# Patient Record
Sex: Male | Born: 2000 | Race: Black or African American | Hispanic: No | Marital: Single | Smoking: Never smoker
Health system: Southern US, Community
[De-identification: ages and names within clinical notes are randomized; demographics above are authoritative.]

## PROBLEM LIST (undated history)

## (undated) DIAGNOSIS — G6181 Chronic inflammatory demyelinating polyneuritis: Secondary | ICD-10-CM

## (undated) DIAGNOSIS — E119 Type 2 diabetes mellitus without complications: Secondary | ICD-10-CM

## (undated) HISTORY — PX: NO PAST SURGERIES: SHX2092

## (undated) HISTORY — DX: Chronic inflammatory demyelinating polyneuritis: G61.81

## (undated) NOTE — *Deleted (*Deleted)
DIABETES SURVIVAL SKILLS PROGRAM  AGENDA    Endocrinology provider: Dr. Fransico Michael (upcoming appt 01/12/20 3:45pm)  Dietitian: [name], RD (no upcoming appt) -Previous appts on 06/10/18 and 02/25/19  Behavioral health specialist: Dr. Huntley Dec (no upcoming appt) -No prior appt  Patient referred by Dr. Fransico Michael for diabetes education. PMH is significant for T1DM with severe insulin resistance, acanthosis, diabetic polyneuropathy, morbid obesity, prior hx of COVID-19 infection 27-Nov-2019), and depression. Patient was lost to follow up from 02/25/19 to 12/03/19. At prior appt with Dr. Fransico Michael on 12/03/19, multiple issues were discussed. Patient states Spring/Summer 2021 he lost focus on his DM management due to how his mother passed away (now living with his grandmother). He has been guessing at his Novolog doses (not following the sliding scale plan provided by Dr Vanessa Mon[place] Mill on 11/04/18). He has SEVERE neuropathy; he can't stand up from a sitting position without using his arms for support and he also experiences numbness and loss of strength in the palm of his right hand. He was previously followed by Dr. Devonne Doughty, but has "aged out" of that practice. He was a no show for two NP appointments at [facility], once in Oct 27, 2022 when his mother died and once in 11/27/22 when hew was admitted with DKA. Dr. Fransico Michael asked the grandmother to call [facility] and obtain a new appointment for him. Patient did mention though at prior appt with Dr Fransico Michael that he wants help in managing his DM. Dr. Fransico Michael advised patient to continue Tresiba 100 units daily (200 units/mL pen) and to re-start Novolog sliding scale + fixed dose of Novolog 10 units three times daily prior to meals. Dr. Fransico Michael would like to transition patient to a 2 component plan (carb + correction factor) for rapid acting insulin in the future.  At prior appt for diabetes education on 12/16/2019, the following topics were discussed: diabetes  pathophysiology overview, diagnosis, monitoring, hypoglycemia management, glucagon use, hyperglycemia management, sick days management, blood sugar meters, continuous glucose monitors, and insulin pumps.   Patient presents with grandma Richarda Overlie) *** for follow up diabetes education appt. He reports taking Tresiba 100 units daily and typically takes Novolog 12-13 units. He eats 2-3 meals/day (usually breakfast/lunch). He states he has checked his blood sugar 5 times each day. He checks before he is about to eat and after he eats (20 minutes). He does not check fasting BG in the morning. He has noticed his blood sugar has been running in 230-240 mg/dL. He states his blood sugar has increased to 300-400 mg/dL after meals. He reports adherence to [place] and thinks he has forgotten his Novolog 2-3 times since prior appt.   Started metformin or prilosec?  Neurologist appt?  Remaining topics to discuss at follow up appointment are medications, exercise, and mental health How did prior appt with Georgiann Hahn go -- carb counting or carb estimation? Follow up with pictures of food Chair exercises?  School: not in school right now; planning to re-enroll in [employer] [place] [employer] (he is unsure of time of re-enroll)  Insurance Coverage: Managed Medicaid [facility] plan; ID # [id number])  Diabetes Diagnosis: 04/23/18  Family History: T2DM (maternal grandmother, paternal grandfather); T1DM (paternal grandmother); DM (uncle)  Patient-Reported BG Readings: ***  -Patient *** hypoglycemic events. --Treats hypoglycemic episode with candy (2 starbusts) --Hypoglycemic symptoms: "cold, calm, peaceful"  Preferred Pharmacy [facility] [phone] - [place], [place] - [address] AT [facility] [address]  [address] [place] [address]  Phone:  [phone] Fax:  [phone]  DEA #:  [id number]  Medication Adherence -Patient *** adherence with medications.  -Current diabetes medications include:  Tresiba 100 units daily (200 units/mL pen), Novolog 10 units prior to meals + SS (listed below) Novolog sliding scale before meals as follows: 150-199 take 1 extra unit of Novolog- total 11 units 200- take 2 extra units of Novolog- total 12 units 300s- take 3 extra units of Novolog- total 13 units 400s- take 4 extra units of Novolog - total 14 units 500s- take 5 extra units of Novolog - total 15 units 600s/HI take 6 extra units of Novolog- total 16 units.  -Prior diabetes medications include: none  Injection Sites (*** changes since prior appt on 12/16/2019) -Patient-reports injection sites are right arm, abdomen --Patient reports independently injecting DM medications. --Patient reports rotating injection sites  Diet (*** changes since prior appt on 12/16/2019) Patient reported dietary habits:  Eats 2 meals/day and 1 snacks/day Breakfast (8:30 am): eggs, cheese sandwich, [place] bacon, [place] sausage, [place] hot dogs Lunch (2-3 pm): frozen fried rice, tyson chicken nuggets, pot pies, hot pocket,  Dinner: skips  -eats ~3x per week (ramen noodles, fried chicken, baked chicken, broccoli, carrots, corn, bean) Snacks: cheese crackers Drinks: 1 gallon of milk daily, water (> 12 bottles daily), only zero sugar soda/juice (daily) -pasta/rice/bread: bread daily, rice 2x per week, pasta 1x per week -Corn/peas/squash: no squash/peas, eats corn 1x per week -Cereal: does not eat -Fruit: eats 2-3x per day (peaches, pears in fruit cups)  Exercise (*** changes since prior appt on 12/16/2019) Patient-reported exercise habits: walks around house (10 min at a time, 2x daily)  Monitoring: Patient *** episodes of nocturia (nighttime urination) each night.  Patient reports neuropathy (nerve pain) on right arm/hand. Patient *** visual changes. (Followed by ophthalmology; last seen 2 years ago; upcoming appt on 01/02/20) Patient *** self foot exams.  -Patient *** wearing socks/slippers in the house and  shoes outside.  -Patient *** monitoring for open wounds/cuts on her feet.  Diabetes Survival Skills Class  Topics:  1. Diabetes pathophysiology overview 2. Diagnosis 3. Monitoring 4. Hypoglycemia management 5. Glucagon Use 6. Hyperglycemia management 7. Sick days management  8. Medications 9. Blood sugar meters 10. Continuous glucose monitors 11. Insulin Pumps 12. Exercise  13. Mental Health 14. Diet  DSSP BINDER / INFO DSSP Binder  introduced & given  Disaster Planning Card Straight Answers for Kids/Parents  HbA1c - Physiology/Frequency/Results Glucagon App Info  THE PHYSIOLOGY OF TYPE 1 DIABETES Autoimmune Disease: can't prevent it;  can't cure it;  Can control it with insulin How Diabetes affects the body  2-COMPONENT METHOD REGIMEN  Using 2 Component Method _X_Yes   1.0 unit dosing scale Baseline  Insulin Sensitivity Factor Insulin to Carbohydrate Ratio  Components Reviewed:  Correction Dose, Food Dose,  Bedtime Carbohydrate Snack Table, Bedtime Sliding Scale Dose Table  Reviewed the importance of the Baseline, Insulin Sensitivity Factor (ISF), and Insulin to Carb Ratio (ICR) to the 2-Component Method Timing blood glucose checks, meals, snacks and insulin  MEDICAL ID: Why Needed  Emergency information given: Order info given DM Emergency Card  Emergency ID for vehicles / wallets / diabetes kit  Who needs to know  Know the Difference:  Sx/S Hypoglycemia & Hyperglycemia Patient's symptoms for both identified  ____TREATMENT PROTOCOLS FOR PATIENTS USING INSULIN INJECTIONS___  PSSG Protocol for Hypoglycemia Signs and symptoms Rule of 15/15 Rule of 30/15 Can identify Rapid Acting Carbohydrate Sources What to do for non-responsive diabetic Glucagon Kits:  PharmD demonstrated,  Parents/Pt. Successfully e-demonstrated      Patient / Parent(s) verbalized their understanding of the Hypoglycemia Protocol, symptoms to watch for and how to treat; and how to treat  an unresponsive diabetic  PSSG Protocol for Hyperglycemia Physiology explained:    Hyperglycemia      Production of Urine Ketones  Treatment   Rule of 30/30   Symptoms to watch for Know the difference between Hyperglycemia, Ketosis and DKA  Know when, why and how to use of Urine Ketone Test Strips:    PharmD demonstrated    Parents/Pt. Re-demonstrated  Patient / Parents verbalized their understanding of the Hyperglycemia Protocol:    the difference between Hyperglycemia, Ketosis and DKA treatment per Protocol   for Hyperglycemia, Urine Ketones; and use of the Rule of 30/30.  PSSG Protocol for Sick Days How illness and/or infection affect blood glucose How a GI illness affects blood glucose How this protocol differs from the Hyperglycemia Protocol When to contact the physician and when to go to the hospital  Patient / Parent(s) verbalized their understanding of the Sick Day Protocol, when and how to use it  PSSG Exercise Protocol How exercise effects blood glucose The Adrenalin Factor How high temperatures effect blood glucose Blood glucose should be 150 mg/dl to 161 mg/dl with NO URINE KETONES prior starting sports, exercise or increased physical activity Checking blood glucose during sports / exercise Using the Protocol Chart to determine the appropriate post  Exercise/sports Correction Dose if needed Preventing post exercise / sports Hypoglycemia Patient / Parents verbalized their understanding of of the Exercise Protocol, when / how  to use it  Blood Glucose Meter Care and Operation of meter Effect of extreme temperatures on meter & test strips How and when to use Control Solution:  PharmD Demonstrated; Patient/Parents Re-demo'd How to access and use Memory functions  Lancet Device Reviewed / Instructed on operation, care, lancing technique and disposal of lancets and  MultiClix and FastClix drums  Subcutaneous Injection Sites  Abdomen Back of the arms Mid anterior  to mid lateral upper thighs Upper buttocks  Why rotating sites is so important  Where to give Lantus injections in relation to rapid acting insulin   What to do if injection burns  Insulin Pens:  Care and Operation Expiration dates and Pharmacy pickup Storage:   Refrigerator and/or Room Temp Change insulin pen needle after each injection How check the accuracy of your insulin pen Proper injection technique Operation/care demonstrated by PharmD; Parents/Pt.  Re-demonstrated  NUTRITION AND CARB COUNTING Defining a carbohydrate and its effect on blood glucose Learning why Carbohydrate Counting so important  The effect of fat on carbohydrate absorption How to read a label:   Serving size and why it's important   Total grams of carbs  Sugar substitutes Portion control and its effect on carb counting.  Using food measurement to determine carb counts Calculating an accurate carb count to determine your Food Dose Using an address book to log the carb counts of your favorite foods (complete/discreet) Converting recipes to grams of carbohydrates per serving How to carb count when dining out  DIABETES RESOURCE LIST FOR PATIENTS & FAMILIES   Websites for Children & Families: www.diabetes.org  (American Diabetes Assoc.)(kids and teens sections under   [employer].  Diabetes [employer] information).  www.childrenwithdiabetes.com (organization for children/families with Type 1 Diabetes) www.jdrf.com (Juvenile Diabetes Assoc) www.diabetesnet.com www.lennydiabetes.com   (Carb Count and diabetes games, contests and iPhone Apps Sela Hua is "the Children's Diabetes Ambassador".)  https://mullins.com/  (Diabetes Lifestyle Resource. TV Program, 9000+ diabetes -friendly   recipes, videos)  Products  www.friocase.com  www.amazon.com  : 1. Food scales (our diabetes patients and parents seem to like the Kitrics Food Scale best. 2. Aqua Care with 10% Urea Skin Cream by [facility] Labs can be ordered at   www.amazon.com .  Use for dry skin. Comes in a lotion or 2.5 oz tube (Approximately $8 to $10). 3. SKIN-Tac Adhesive. Used with infusion sets for insulin pumps. Made by [employer]. Comes in liquid or individual foil packets (50/box). 4. TAC-Away Adhesive Remover.  50/box. Helps remove insulin pump infusion set adhesive from skin.  Infusion Pump Cases and Accessories 1. www.diabetesnet.com 2. www.medtronicdiabetes.com 3. www.StubAgent.pl   Diabetes ID Bracelets and Necklaces www.medicalert.com (Medic Alert bracelets/necklaces with emergency 800# for your   medical info in case needed by EMS/Emergency Room personnel) www.StubAgent.pl (Medical ID bracelets/necklaces, pump cases and DM supply cases) www.laurenshope.com (Medical Alert bracelets/necklaces) www.medicalided.com  Food and Carb Counting Web Sites www.calorieking.com www.ColumbusDryCleaner.fr  www.dlife.com  Assessment:  Education:  Remaining topics to discuss at follow up appointment are medications, exercise, mental health, and food. These topics *** completed.  Plan: 1. Medications:  a. *** Tresiba 100 units b. *** Novolog dosage 2. Diet:  a. Since patient is 45 years old, advised patient to eat 45-60 grams of carb per meal (3 meals/day) and eat 15 grams for snacks (2 snacks/day) 3. Exercise: a. *** 4. Mental Health a. Unable to discuss referral to Dr. Huntley Dec 5. Monitoring:  a. Stressed importance of monitoring BG minimally 3-4x daily 6. Follow Up: ***  This appointment required *** minutes of patient care (this includes precharting, chart review, review of results, face-to-face care, etc.).  Thank you for involving clinical pharmacist/diabetes educator to assist in providing this patient's care.  Zachery Conch, PharmD, CPP

---
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
## 2020-01-01 NOTE — Telephone Encounter (Signed)
Called [facility] to follow up, spoke with Lucas Cunningham, he is able to get all the [employer].  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  He asked about it being covered by insurance and I dold him we already had a PA approved and if he needs that PA number I can provide it.  He does not think he will need the PA number but he will call back if he has any questions.  I also told him the patient may or may not pick up the items between now and the 23rd as he has an appointment here on the 23rd and may wait until then to pick up.

## 2020-01-02 NOTE — Progress Notes (Signed)
Lucas Cunningham    Endocrinology provider: Dr. Tobe Sos (upcoming appt 01/12/20 3:45pm)  Dietitian: Jean Rosenthal, RD (upcoming appt 01/13/20 3:30 pm) -Previous appts on 06/10/18, 02/25/19, and 12/22/2019  Behavioral health specialist: Dr. Mellody Dance (no upcoming appt) -No prior appt  Patient referred by Dr. Tobe Sos for diabetes education. PMH is significant for T1DM with severe insulin resistance, acanthosis, diabetic polyneuropathy, morbid obesity, prior hx of COVID-19 infection 11/08/2019), and depression. Patient was lost to follow up from 02/25/19 to 12/03/19. At prior appt with Dr. Tobe Sos on 12/03/19, multiple issues were discussed. Patient states Spring/Summer 2021 he lost focus on his DM management due to how his mother passed away (now living with his grandmother). He has been guessing at his Novolog doses (not following the sliding scale plan provided by Dr Baldo Ash on 11/04/18). He has SEVERE neuropathy; he can't stand up from a sitting position without using his arms for support and he also experiences numbness and loss of strength in the palm of his right hand. He was previously followed by Dr. Jordan Hawks, but has "aged out" of that practice. He was a no show for two NP appointments at [facility], once in 10/08/2022 when his mother died and once in 11-08-2022 when hew was admitted with DKA. Dr. Tobe Sos asked the grandmother to call [facility] and obtain a new appointment for him. Patient did mention though at prior appt with Dr Tobe Sos that he wants help in managing his DM. Dr. Tobe Sos advised patient to continue Tresiba 100 units daily (200 units/mL pen) and to re-start Novolog sliding scale + fixed dose of Novolog 10 units three times daily prior to meals. Dr. Tobe Sos would like to transition patient to a 2 component plan (carb + correction factor) for rapid acting insulin in the future.  At prior appt on 12/31/2019, patient admitted that his BG meter was  broken. He had been using his grandmother's meter but did not want to bring it to clinic because he was not sure how to differentiate between her BG readings and his BG readings. He confirmed he purchased ketone strips. He also started exercising - reported doing leg kicks in his bed. His grandmother had been recording food items in food diary, however did not record grams of carbohydrate. Patient was provided Accu Chek meter and prescriptions were sent in for additional test strips/lancets. Patient was instructed to help grandma record amount of carbohydrates eaten and times of meal. Since patient did not bring in meter and BG readings could not be assessed insulin doses were continued. It is extremely challenging for patient to administer insulin doses and manually check BG readings considering his severe neuropathy. He lacks sensation in his left arm/hand and is losing sensation in his right hand. Discussed alternative options for DM management - home health aid/Afrezza/insulin pump. Also discussed CGM. Patient was agreeable to using Afrezza and to Dexcom G6 CGM. Prior authorizations were successfully completed and approved by Mike Gip, RN, on 12/31/19. Prescriptions were sent in to [facility].  Patient presents with grandma Haroldine Laws) for follow up diabetes education appt. Patient has obtained Dexcom G6 supplies and Afrezza supplies from the pharmacy [facility]) and brought to appointment. Patient did not bring manual BG meter to clinic. Patient's BG was 253 mg/dL upon arrival to clinic. He stated he had taken his Tyler Aas, however, he had not administered any Novolog yet. He reported he had not eaten breakfast yet. Patient reports he typically has been administering ~16 units of Novolog  with meals.   School: not in school right now; planning to re-enrol in [place] (he is unsure of time of re-enroll)  Insurance Coverage: Managed Medicaid [facility] plan; ID # [id number])  Diabetes Diagnosis:  04/23/18  Family History: T2DM (maternal grandmother, paternal grandfather); T1DM (paternal grandmother); DM (uncle)  Patient-Reported BG Readings:  -Patient denies hypoglycemic events. --Treats hypoglycemic episode with candy (2 starbusts) --Hypoglycemic symptoms: "cold, calm, peaceful"  Preferred Pharmacy [facility] [phone] - [place], [address]  [address] [place] [address]  Phone:  [phone] Fax:  [phone]  DEA #:  [id number]  Medication Adherence -Patient reports adherence with medications.  -Current diabetes medications include: Tresiba 90 units daily (200 units/mL pen), Novolog (Target BG 200, ISF 13, ICR 4) -Prior diabetes medications include: none  Injection Sites (no changes since prior appt on 12/31/19) -Patient-reports injection sites are right arm, abdomen --Patient reports independently injecting DM medications. --Patient reports rotating injection sites  Diet (no changes since prior appt on 12/31/19) Patient reported dietary habits (typically eats ~45 grams of carb):  Eats 2 meals/day and 1 snacks/day Breakfast (8:30-9:00 am): eggs with cheese, cheese sandwich with butter, 3 beef sausage patties / 3 eggs with 2 pieces of toast, hot dog slices Lunch (38VF -12 pm): chicken breast with eggs or ramen with tuna Dinner (10-11pm): 2 ham sandwiches (2 pieces ham with 1 slice of cheese and mayonnaise), sometimes potatoes Snacks: sugar free chocolate wafer cookies, mixed fruit Drinks: coffee with powder creamer, 2% milk (1-2 cups), water (24 bottle case in 2-3 days)  Exercise (changes since prior appt on 12/31/19) Patient-reported exercise habits: walks around house (10 min at a time, 2x daily); leg kicks in bed   Dexcom G6 patient education Person(s)instructed: patient, grandmother  Instruction: Patient oriented to three components of Dexcom G6 continuous glucose monitor (sensor,  transmitter, receiver/cellphone) Receiver or cellphone: receiver -Dexcom G6 AND dexcom clarity app downloaded onto cellphone  -Patient educated that Dexom G6 app must always be running (patient should not close out of app) -If using Dexcom G6 app, patient may share blood glucose data with up to 10 followers on dexcom follow app. Sensor code: [phone] Transmitter code: [id number]  CGM overview and set-up  1. Button, touch screen, and icons 2. Power supply and recharging 3. Home screen 4. Date and time 5. Set BG target range: 80-350 mg/dL 6. Set alarm/alert tone  7. Interstitial vs. capillary blood glucose readings  8. When to verify sensor reading with fingerstick blood glucose 9. Blood glucose reading measured every five minutes. 10. Sensor will last 10 days 11. Transmitter will last 90 days and must be reused  12. Transmitter must be within 20 feet of receiver/cell phone.  Sensor application -- sensor placed on  1. Site selection and site prep with alcohol pad 2. Sensor prep-sensor pack and sensor applicator 3. Sensor applied to area away from waistband, scarring, tattoos, irritation, and bones 4. Transmitter sanitized with alcohol pad and inserted into sensor. 5. Starting the sensor: 2 hour warm up before BG readings available 6. Sensor change every 10 days and rotate site 7. Call Dexcom customer service if sensor comes off before 10 days  Safety and Troubleshooting 1. Do a fingerstick blood glucose test if the sensor readings do not match how    you feel 2. Remove sensor prior to magnetic resonance imaging (MRI), computed tomography (CT) scan, or high-frequency electrical heat (diathermy) treatment. 3.  Do not allow sun screen or insect repellant to come into contact with Dexcom G6. These skin care products may lead for the plastic used in the Dexcom G6 to crack. 4. Dexcom G6 may be worn through a Environmental education officer. It may not be exposed to an advanced Imaging Technology (AIT)  body scanner (also called a millimeter wave scanner) or the baggage x-ray machine. Instead, ask for hand-wanding or full-body pat-down and visual inspection.  5. Doses of acetaminophen (Tylenol) >1 gram every 6 hours may cause false high readings. 6. Hydroxyurea (Hydrea, Droxia) may interfere with accuracy of blood glucose readings from Dexcom G6. 7. Store sensor kit between 36 and 86 degrees Farenheit. Can be refrigerated within this temperature range.  Contact information provided for [facility] customer service and/or trainer.  There were no vitals filed for this visit.  Lab Results  Component Value Date   HGBA1C 14.4 (H) 10/21/2019   HGBA1C 14.0 (A) 02/25/2019   HGBA1C >14 02/25/2019    Lab Results  Component Value Date   CPEPTIDE 3.8 04/23/2018    Assessment:  Patient's goal with DM management: "Get diabetes controlled as independently as possible"  DM remains uncontrolled, but is improving. BG upon arrival to clinic was 253 mg/dL. His meter is broken so he did not bring it to clinic. He does not report hypoglycemic symptoms. Patient appears improved clinically as well - he is quicker to response and able to get around with his walker faster as well. Thoroughly counseled patient on Afrezza - mechanism of action, onset, proper administration, side effects, dosing, and stability. Provided patient handout. Patient was able to successfully demonstrate understanding using teach back method and showing proper administration with demo device. Patient has variety pack of Afrezza, which contains blue cartridges (4 units), green cartridges (8 units), and yellow cartridges (12 units). He states he typically is using 16 units of Novolog with meals and understands that he will have to use 2 cartridges (2 green cartridges (8 + 8 = 16) OR 1 yellow cartridge + 1 blue cartridge (12 + 4 = 16)). Advised him to drink water (provided him water) before and after inhalation to prevent coughing (side effect of  Afrezza). At end of appointment, patient stated that he thinks Afrezza will be much easier to use than Novolog considering his severe neuropathy.  Dexcom G6 CGM placed on back of patient's right side of abdomen successfully. Emailed patient ([email or web address]) instructions on how to synched Dexcom Clarity to [facility] Clarity account with receiver on computer at home. Discussed difference between glucose reading from blood vs interstitial fluid, how to interpret Dexcom arrows, how to order Dexcom sensor overpatches, and use of Skin Tac/Tac Away to assist with CGM adhesion/removal. Provided handout with all of this information as well.  Plan: 1. Medications:  a. Continue Tresiba 90 units b. INITIATE Afrezza (Target BG 200, ISF 13, ICR 4) 2. Monitoring a. INITIATE Dexcom G6 CGM b. Kodah Maret has a diagnosis of diabetes, checks blood glucose readings > 4x per day, treats with > 3 insulin injections, and requires frequent adjustments to insulin regimen. This patient will be seen every six months, minimally, to assess adherence to their CGM regimen and diabetes treatment plan 3. Follow Up: 01/16/20 12:00 pm (sooner if patient has issues)  This appointment required 75 minutes of patient care (this includes precharting, chart review, review of results, face-to-face care, etc.).  Thank you for involving clinical pharmacist/diabetes educator to assist in providing this patient's care.  Stanton Kidney  Lovena Le, PharmD, CPP, CDCES

## 2020-01-05 NOTE — Telephone Encounter (Signed)
Called [facility] to verify Afrezza and Dexcom are ready for pick up tomorrow.  1 box of Afrezza is ready and 1 on order, but he can pick up the one box and the Dexcom supplies tomorrow.

## 2020-01-06 ENCOUNTER — Other Ambulatory Visit: Payer: Self-pay

## 2020-01-06 ENCOUNTER — Ambulatory Visit (INDEPENDENT_AMBULATORY_CARE_PROVIDER_SITE_OTHER): Payer: Medicaid Other | Admitting: Pharmacist

## 2020-01-06 VITALS — Wt 271.2 lb

## 2020-01-06 DIAGNOSIS — E1065 Type 1 diabetes mellitus with hyperglycemia: Secondary | ICD-10-CM

## 2020-01-06 LAB — POCT GLUCOSE (DEVICE FOR HOME USE): POC Glucose: 253 mg/dl — AB (ref 70–99)

## 2020-01-06 NOTE — Patient Instructions (Signed)

## 2020-01-06 NOTE — Telephone Encounter (Signed)
Scripts picked up from pharmacy and brought to today's appointment.

## 2020-01-06 NOTE — Progress Notes (Signed)
DIABETES SURVIVAL SKILLS PROGRAM  AGENDA    Endocrinology provider: Dr. Fransico Michael (upcoming appt 03/14/19 9:00 am)  Dietitian: [name], RD (no upcoming appt) -Previous appts on 06/10/18, 02/25/19, and 12/22/2019  Behavioral health specialist: Dr. Huntley Dec (no upcoming appt) -No prior appt  Patient referred by Dr. Fransico Michael for diabetes education. PMH is significant for T1DM with severe insulin resistance, acanthosis, diabetic polyneuropathy, morbid obesity, prior hx of COVID-19 infection 2019-11-04), and depression. Patient was lost to follow up from 02/25/19 to 12/03/19. At prior appt with Dr. Fransico Michael on 12/03/19, multiple issues were discussed. Patient states Spring/Summer 2021 he lost focus on his DM management due to how his mother passed away (now living with his grandmother). He has been guessing at his Novolog doses (not following the sliding scale plan provided by Dr Vanessa [place] on 11/04/18). He has SEVERE neuropathy; he can't stand up from a sitting position without using his arms for support and he also experiences numbness and loss of strength in the palm of his right hand. He was previously followed by Dr. Devonne Doughty, but has "aged out" of that practice. He was a no show for two NP appointments at [facility], once in 04-Oct-2022 when his mother died and once in 11/04/22 when hew was admitted with DKA. Dr. Fransico Michael asked the grandmother to call [facility] and obtain a new appointment for him. Patient did mention though at prior appt with Dr Fransico Michael that he wants help in managing his DM. Dr. Fransico Michael advised patient to continue Tresiba 100 units daily (200 units/mL pen) and to re-start Novolog sliding scale + fixed dose of Novolog 10 units three times daily prior to meals. Dr. Fransico Michael would like to transition patient to a 2 component plan (carb + correction factor) for rapid acting insulin in the future.  At prior appt on 01/06/2020, patient was successfully started on Afrezza and Dexcom G6  CGM.  Patient presents with grandma Richarda Overlie) for follow up diabetes education appt. Patient's stomach and head were hurting on Monday so he rescheduled physical therapy/occupational therapy on 01/13/20. He also rescheduled appt with Dr. Fransico Michael from 01/12/20 to 03/13/19 9:00 am. PT/OT told him he needs to use hand more regularly and use the walker when walking. PT/OT also recommended leg braces for feet and brace for his bad hand. Exercises he did at PT/OT - standing up then down, walking, and moving blocks with his bad hand. He reports he thinks after PT/OT he will be able to regain function with his bad hand. He will follow with PT/OT every 8 weeks. He requests a Humalog Stephanie Coup - states it is easier to inject than Novolog pen if he forgets to take Afrezza. He takes Novolog pen for breakfast (forgets to pull out Afrezza from fridge), Afrezza for lunch, Afrezza for dinner. He uses 2 green cartridges for every meal (16 units).  He was confused and didn't realize he was instructed to continue carb counting to calculate food dose and reviewing BG to calculate correction dose.  School: not in school right now; planning to re-enrol in [place] (he is unsure of time of re-enroll)  Insurance Coverage: Managed Medicaid [facility] plan; ID # [id number])  Diabetes Diagnosis: 04/23/18  Family History: T2DM (maternal grandmother, paternal grandfather); T1DM (paternal grandmother); DM (uncle)  Patient-Reported BG Readings:  -Patient denies hypoglycemic events. --Treats hypoglycemic episode with candy (2 starbusts) --Hypoglycemic symptoms: "cold, calm, peaceful"  Preferred Pharmacy [facility] [phone] - [place], [place] - [address]  [address] [place] [address]  Phone:  [phone] Fax:  [phone]  DEA #:  [id number]  Medication Adherence -Patient reports adherence with [name], however, sometimes forgets Afrezza time  insulin. -Current diabetes medications include: Tresiba 90 units daily (200 units/mL pen), Afrezza (Target BG 200, ISF 13, ICR 4) -Prior diabetes medications include: none  Injection Sites (changes since prior appt on 01/06/20) -Patient-reports injection sites are right arm, abdomen, now doing his legs  --Patient reports independently injecting DM medications. --Patient reports rotating injection sites  Diet (changes since prior appt on 01/06/20) Patient reported dietary habits (gets $90 in food stamps each month) Eats 2 meals/day and 1 snacks/day Breakfast (8:30-9:00 am): 3 eggs with 2 pieces of whote wheat natuure's own brand toast with [place] breast or bolognea --Carbs: 11 g per slice of toast, 22 g total Lunch (11am -12 pm): chicken breast with eggs or ramen with tuna, now doing chicken/tuna with [place] chicken flavor ramen (2 bags at a time) and mayonnaise  --Carbs: 52 g per bag, 104 g total  Dinner (10-11pm): will skip dinner occasionally (2x in past week), noodles with chicken/tuna and mayonnaise;  Snacks: sugar free chocolate wafer cookies, mixed fruit Drinks: coffee with powder creamer, 2% milk (1-2 cups), water (24 bottle case in 2-3 days), Shake em up powder (sugar free, starburst)   Exercise (changes since prior appt on 01/06/20) Patient-reported exercise habits: walks around house (10 min at a time, 2x daily), sometimes walk around porch; leg kicks in bed everyday for 5 min    Monitoring: Patient 2 episodes of nocturia (nighttime urination).  Patient denies neuropathy (nerve pain). Patient denies visual changes. (Followed by ophthalmology) Patient reports self foot exams.  -Patient reports socks/slippers in the house and shoes outside.  -Patient reports monitoring for open wounds/cuts on her feet; no open cuts/wounds.       There were no vitals filed for this visit.  Lab Results  Component Value Date   HGBA1C 14.4 (H) 10/21/2019   HGBA1C 14.0 (A) 02/25/2019    HGBA1C >14 02/25/2019    Lab Results  Component Value Date   CPEPTIDE 3.8 04/23/2018    Assessment:  Patient's goal with DM management: "Get diabetes controlled as independently as possible"  TIR 0%, 0% low/very low. DM remains uncontrolled but is improving. Unfortunately, patient only wore Dexcom 18% since last appt when Dexcom was initiated on 11/23. There are a few days of data available to review trend. His Dexcom died and since he was not reviewing BG reading to calculate insulin dose he was unaware that it died. It is possible he was using a Estate manager/land agent. He will start reviewing BG readings to calculate correction dose for meals and he will charge Dexcom with green charging cord that Dexcom supplies.Will increase Tresiba 90 units --> 100 units. Patient was using ~16 units of Afrezza (or Novolog if he forgot to take Afrezza out of the fridge) with meals and stopped calculating correction dose/food dose for meals. He likes Humalog pen better than Novolog (easier to inject). Will complete Humalog PA. There was confusion in regards to instructions at last appt with myself Advised patient to restart using graph provided to calculate carb dose/corrrection dose. Assisted patient with carb counting for his current meals - currently eats ~22 grams of carb for breakfast and ~104 grams of carb for lunch/dinner. Reviewed how to calculate Afrezza dose using chart again by going through 3 examples. He is able to repeat  back instructions via teach back method to demonstrate understanding. He would prefer f/u appt in 1 week.  Plan: 1. Medications:  a. INCREASE Tresiba 90 units --> 100 units daily b. Continue Afrezza (Target BG 200, ISF 13, ICR 4) i. Will complete Humalog Kwikpen PA to use as back up   ii. Take tray of Afrezza out of fridge and replace when 2-3 packs of cartridges are left 2. Monitoring a. Continue Dexcom G6 CGM b. Fredis Malkiewicz has a diagnosis of diabetes, checks blood glucose readings >  4x per day, treats with > 3 insulin injections, and requires frequent adjustments to insulin regimen. This patient will be seen every six months, minimally, to assess adherence to their CGM regimen and diabetes treatment plan 3. Follow Up: 1 week  This appointment required 60 minutes of patient care (this includes precharting, chart review, review of results, face-to-face care, etc.).  Thank you for involving clinical pharmacist/diabetes educator to assist in providing this patient's care.  Zachery Conch, PharmD, CPP, CDCES

## 2020-01-07 ENCOUNTER — Telehealth (INDEPENDENT_AMBULATORY_CARE_PROVIDER_SITE_OTHER): Payer: Self-pay | Admitting: Pharmacist

## 2020-01-07 NOTE — Telephone Encounter (Signed)
Contacted patient to assess how new medication (Afrezza) and CGM (Dexcom G6) doing.  He forgot to administer insulin for dinnner last night. He states today he was going to use Afrezzza but forgot to take it out of the fridge so he administered insulin injections today. He has a few Afrezza cartridges in his room now to keep at room temperature so he does not forget to use them.  Will see patient in 1 week to re-assess how he is doing.   Thank you for involving clinical pharmacist/diabetes educator to assist in providing this patient's care.   Zachery Conch, PharmD, CPP, CDCES

## 2020-01-12 ENCOUNTER — Ambulatory Visit (INDEPENDENT_AMBULATORY_CARE_PROVIDER_SITE_OTHER): Payer: Medicaid Other | Admitting: "Endocrinology

## 2020-01-12 ENCOUNTER — Ambulatory Visit: Payer: Medicaid Other | Admitting: Physical Therapy

## 2020-01-12 ENCOUNTER — Ambulatory Visit: Payer: Medicaid Other | Admitting: Occupational Therapy

## 2020-01-12 NOTE — Progress Notes (Deleted)
Subjective:  Subjective  Patient Name: Lucas Cunningham Date of Birth: Jun 28, 2000  MRN: [id number]  Lucas Cunningham  presents to the office today for follow up evaluation and management of his type 2 diabetes  HISTORY OF PRESENT ILLNESS:   Lucas Cunningham is a 19 y.o. African-American young man.   Lucas Cunningham was accompanied by his grandmother, whom he lives with now. .  1. Lucas Cunningham was seen by his PCP in March 2019 for his 16 year [place]. At that visit he was noted to have had rapid weight gain since his 14 year [place].  Weight had increased from 111.2 kg (245 lbs) to 154.7 kg (341 lbs). His A1C was 6.3%. His triglycerides were elevated at 532 mg/dL. His Vit D was low at 6.5 ng/mL. He was referred to endocrinology for further evaluation. .  2. Lucas Cunningham was subsequently hospitalized at [facility] on 04/23/18 for newly-diagnosed insulin-requiring T2DM in the setting or morbid obesity, dehydration, and hyperosmolar hyperglycemic state. I consulted on him then. He was started on Lantus insulin and Novolog insulin using a two-component plan. He was discharged on 04/30/18. When his insulin autoantibody result was reported as elevated, his diagnosis was changed to Type 1 DM with severe insulin resistance.   3. Clinical course:   A. Lucas Cunningham was admitted to [facility] on November 06, 2019 for DKA and incidentally recognized covid-19 infection. His CBG was 357, venous pH 7.17, BHOB >8.0 (ref 0.05-0.27), and HbA1c 14.4%. His DKA was attributed to non-compliance with taking his insulins. He received monoclonal antibody treatment on November 06, 2019. He was discharged on 10/25/19. He admitted that during the Spring and summer he lost focus on his diabetes care, then his mother died and he became depressed. He has moved in with his grandmother, who also has DM. He now wants help in taking care of himself.    4. Lucas Cunningham was last seen in [facility] on 12/03/19.   A. In the interim, Lucas Cunningham has been healthy.  B. He has been working with Dr.  Lovena Le, PharmD and CDE, to imrpove his DM care.    C. He takes 100 units of Tresiba insulin per day.   D. He has not been following the Novolog plan written by Dr. Charna Archer on 02/27/19 which was: he has just been guessing at doses.  Novolog fixed 10 units before meals plus sliding scale - Novolog sliding scale before meals as follows: 150-199 take 1 extra unit of Novolog- total 11 units 200s - take 2 extra units of Novolog- total 12 units 300s - take 3 extra units of Novolog- total 13 units 400s - take 4 extra units of Novolog - total 14 units 500s - take 5 extra units of Novolog - total 15 units 600s/HI -  take 6 extra units of Novolog- total 16 units   E. He has a Dexcom at home, but is not using it.   F. He has continued to have trouble with decreased strength in his legs. He can't stand up from a sitting position without using his arms for support. When he does stand he can't walk without using a walker for support.   G. He is also numbness and loss of strength in the palm of his right hand.   H. He was a No show for two NP appointments at [facility], once in Oct 06, 2022 when his mother died and once in 06-Nov-2022 when hew was admitted with DKA. I asked the grandmother to call [facility] and obtain a new appointment for him.  4. Pertinent Review of Systems:  Constitutional: The patient feels "a lot better than I was a month ago.  Eyes: Vision seems to be good. There are no recognized eye problems. His last eye exam was in 2019. Neck: The patient has no complaints of anterior neck swelling, soreness, tenderness, pressure, discomfort, or difficulty swallowing.   Heart: Heart rate increases with exercise or other physical activity. The patient has no complaints of palpitations, irregular heart beats, chest pain, or chest pressure.   Lungs: No problems with his breathing.   Gastrointestinal: He has belly hunger and postprandial bloating frequently. He has some dyspepsia. The patient has  no complaints of  acid reflux, upset stomach, stomach aches or pains, diarrhea, or constipation.  Hands: As above Legs: Muscle mass and strength seem normal. There are no complaints of numbness, tingling, burning, or pain. No edema is noted.  Feet: The bottom of his feet feel "funny, tingly, and weak". There are no other complaints of other numbness, tingling, burning, or pain. No edema is noted. Neurologic: He complains that his "balance is off" at times. There are no other recognized problems with muscle movement, sensation, or coordination. GU: He has nocturia frequently. Hypoglycemia- No symptoms recently   Diabetes ID- He has a bracelet, but does not wear it.   Annual Labs- due March 2021  PAST MEDICAL, FAMILY, AND SOCIAL HISTORY  Past Medical History:  Diagnosis Date  . Diabetes mellitus without complication ([place])    type 1    Family History  Problem Relation Age of Onset  . Heart disease Mother   . Hypertension Mother   . Diabetes Maternal Grandmother   . Diabetes Maternal Grandfather   . Diabetes Paternal Grandmother   . Migraines Neg Hx   . Seizures Neg Hx   . Autism Neg Hx   . ADD / ADHD Neg Hx   . Anxiety disorder Neg Hx   . Depression Neg Hx   . Bipolar disorder Neg Hx   . Schizophrenia Neg Hx      Current Outpatient Medications:  .  Accu-Chek FastClix Lancets MISC, 1 Device by Does not apply route as directed. Use with Fastclix lancet device to check blood sugar up to 8x daily. (diagnosis code E10.65), Disp: 300 each, Rfl: 11 .  acetone, urine, test strip, 1 strip by Does not apply route as needed for high blood sugar. Up to 8x daily if necessary, Disp: 360 strip, Rfl: 11 .  Continuous Blood Gluc Receiver (DEXCOM G6 RECEIVER) DEVI, 1 Device by Does not apply route as directed., Disp: 1 each, Rfl: 2 .  Continuous Blood Gluc Sensor (DEXCOM G6 SENSOR) MISC, Inject 1 applicator into the skin as directed. (change sensor every 10 days), Disp: 3 each, Rfl: 11 .   Continuous Blood Gluc Transmit (DEXCOM G6 TRANSMITTER) MISC, Inject 1 Device into the skin as directed. (re-use up to 8x with each new sensor), Disp: 1 each, Rfl: 3 .  gabapentin (NEURONTIN) 300 MG capsule, Take 1 capsule (300 mg total) by mouth 2 (two) times daily. (Patient not taking: Reported on 12/03/2019), Disp: 60 capsule, Rfl: 5 .  Glucagon (BAQSIMI TWO PACK) 3 MG/DOSE POWD, Place 1 spray into the nose as directed., Disp: 2 each, Rfl: 3 .  Glucose Blood (BLOOD GLUCOSE TEST STRIPS) STRP, 1 Device by Other route as directed. To check blood sugar up to 8x per day, Disp: 300 strip, Rfl: 11 .  insulin aspart (NOVOLOG FLEXPEN) 100 UNIT/ML FlexPen, Sig take up to  100 units per day   - Novolog meal coverage to fixed 15 units before meals plus sliding scale  - Novolog sliding scale before meals as follows: 150-199 take 1 extra unit of Novolog- total 16 units 200 - take 2 extra units of Novolog- total 17 units 300- take 3 extra units of Novolog- total 18 units 400- take 4 extra units of Novolog - total 19 units 500- take 5 extra units of Novolog - total 20 units 600 if HI take 6 extra units of Novolog- total 21 units. ., Disp: 15 mL, Rfl:  .  Insulin Degludec (TRESIBA FLEXTOUCH) 200 UNIT/ML SOPN, 100 Units by Subconjunctival route daily. (Patient taking differently: Inject 100 Units into the skin at bedtime. ), Disp: 60 mL, Rfl: 5 .  Insulin Pen Needle (BD PEN NEEDLE NANO U/F) 32G X 4 MM MISC, Inject up to 8 times per day., Disp: 250 each, Rfl: 6 .  Insulin Regular Human (AFREZZA) 4 & 8 & 12 units POWD, Inhale up to 96 units daily, Disp: 360 each, Rfl: 11 .  ketoconazole (NIZORAL) 2 % cream, Apply to fet daily., Disp: 30 g, Rfl: 6 .  Lancets Misc. (ACCU-CHEK FASTCLIX LANCET) KIT, 1 Device by Does not apply route as directed. Use with lancets to check blood sugar up to 8x daily.  (diagnosis code E10.65), Disp: 1 kit, Rfl: 3  Allergies as of 01/12/2020  . (No Known Allergies)     reports that he is a  non-smoker but has been exposed to tobacco smoke. He has never used smokeless tobacco. He reports previous alcohol use. He reports previous drug use. Pediatric History  Patient Parents  . Speranza, Antwane (Father)   Other Topics Concern  . Not on file  Social History Narrative   Live with mom, step dad, brother and sister. He graduated HS 2021       1. School and Family: He finished high school on line. Lives with grandmother now.  2. Activities: Not able to walk very well.  3. Primary Care Provider: Waynard Edwards, NP in the past  ROS: There are no other significant problems involving Duglas's other body systems.    Objective:  Objective  Vital Signs:   There were no vitals taken for this visit.  Blood pressure percentiles are not available for patients who are 18 years or older.  Ht Readings from Last 3 Encounters:  12/22/19 5' 11.26" (1.81 m) (73 %, Z= 0.61)*  10/21/19 6' (1.829 m) (81 %, Z= 0.88)*  08/05/19 5' 11.26" (1.81 m) (74 %, Z= 0.63)*   * Growth percentiles are based on CDC (Boys, 2-20 Years) data.   Wt Readings from Last 3 Encounters:  01/06/20 271 lb 3.2 oz (123 kg) (>99 %, Z= 2.72)*  12/31/19 270 lb 12.8 oz (122.8 kg) (>99 %, Z= 2.71)*  12/22/19 271 lb (122.9 kg) (>99 %, Z= 2.71)*   * Growth percentiles are based on CDC (Boys, 2-20 Years) data.   HC Readings from Last 3 Encounters:  No data found for [facility]   There is no height or weight on file to calculate BSA. No height on file for this encounter. No weight on file for this encounter.   PHYSICAL EXAM:   Constitutional: The patient appears healthy, but obese. He stands with the support of his walker because he can't get up from our chairs. His weight has decreased 41 additional pounds in the past 9 months.  He is alert and bright. His affect and  insight are normal.  Head: The head is normocephalic. Face: The face appears normal. There are no obvious dysmorphic features. Eyes: The eyes  appear to be normally formed and spaced. Gaze is conjugate. There is no obvious arcus or proptosis. Moisture is low.  Ears: The ears are normally placed and appear externally normal. Mouth: The oropharynx and tongue appear normal. Dentition appears to be normal for age. Oral moisture is low.  Neck: The neck appears to be visibly normal. The thyroid gland is 20+ grams in size. The consistency of the thyroid gland is normal. The thyroid gland is not tender to palpation. +2 acanthosis Lungs: The lungs are clear to auscultation. Air movement is good. Heart: Heart rate and rhythm are regular. Heart sounds S1 and S2 are normal. I did not appreciate any pathologic cardiac murmurs. Abdomen: The abdomen is morbidly obese. Bowel sounds are normal. There is no obvious hepatomegaly, splenomegaly, or other mass effect.  Arms: Muscle size and bulk are normal for age.  Hands: There is no obvious tremor. Phalangeal and metacarpophalangeal joints are normal. Palmar muscles are normal for age. Palmar skin is normal. Palmar moisture is low. His right hand is his dominant hand, but his handgrip is weaker on the right. He has decreased sensation to touch in the radial nerve distribution of his right hand.  Legs: Muscles appear normal for age. No edema is present. Feet: Feet are normally formed. Dorsalis pedal pulses are normal 1+. He has 2+ tinea pedis. Neurologic: Strength is difficult to assess overall. Sensation to touch is normal in  the legs, but decreased in both heels.   LAB DATA:   Results for orders placed or performed in visit on 01/06/20 (from the past 672 hour(s))  POCT Glucose (Device for Home Use)   Collection Time: 01/06/20  1:18 PM  Result Value Ref Range   Glucose Fasting, POC     POC Glucose 253 (A) 70 - 99 mg/dl  Results for orders placed or performed in visit on 12/31/19 (from the past 672 hour(s))  POCT Glucose (Device for Home Use)   Collection Time: 12/31/19  9:23 AM  Result Value Ref Range    Glucose Fasting, POC     POC Glucose 348 (A) 70 - 99 mg/dl  Results for orders placed or performed in visit on 12/22/19 (from the past 672 hour(s))  POCT Glucose (Device for Home Use)   Collection Time: 12/22/19 11:46 AM  Result Value Ref Range   Glucose Fasting, POC 361 (A) 70 - 99 mg/dL   POC Glucose    POCT urinalysis dipstick   Collection Time: 12/22/19 11:53 AM  Result Value Ref Range   Color, UA     Clarity, UA     Glucose, UA Positive (A) Negative   Bilirubin, UA     Ketones, UA Trace    Spec Grav, UA     Blood, UA     pH, UA     Protein, UA     Urobilinogen, UA     Nitrite, UA     Leukocytes, UA     Appearance     Odor    Results for orders placed or performed in visit on 12/16/19 (from the past 672 hour(s))  POCT Glucose (Device for Home Use)   Collection Time: 12/16/19 10:24 AM  Result Value Ref Range   Glucose Fasting, POC     POC Glucose 495 (A) 70 - 99 mg/dl  POCT urinalysis dipstick   Collection Time:  12/16/19 10:26 AM  Result Value Ref Range   Color, UA     Clarity, UA     Glucose, UA Positive (A) Negative   Bilirubin, UA     Ketones, UA moderate    Spec Grav, UA     Blood, UA     pH, UA     Protein, UA     Urobilinogen, UA     Nitrite, UA     Leukocytes, UA     Appearance     Odor    POCT Glucose (Device for Home Use)   Collection Time: 12/16/19 11:24 AM  Result Value Ref Range   Glucose Fasting, POC     POC Glucose 486 (A) 70 - 99 mg/dl  POCT urinalysis dipstick   Collection Time: 12/16/19  1:38 PM  Result Value Ref Range   Color, UA     Clarity, UA     Glucose, UA Positive (A) Negative   Bilirubin, UA     Ketones, UA Large    Spec Grav, UA     Blood, UA     pH, UA     Protein, UA     Urobilinogen, UA     Nitrite, UA     Leukocytes, UA     Appearance     Odor     Lab s 12/03/19: CBG 324  Labs 10/21/19: As above  Labs 02/25/19: HbA1c 14.0%, CBG 376; Urine ketones large  Results for PASCUAL, MANTEL (MRN [id number]) as of  06/10/2018 09:38  Ref. Range 04/23/2018 18:28 04/24/2018 01:23  Glutamic Acid Decarb Ab Latest Ref Range: 0.0 - 5.0 U/mL <5.0   Insulin Antibodies, Human Latest Units: uU/mL  5.5 (H)  Pancreatic Islet Cell Antibody Latest Ref Range: Neg:<1:1  Negative        Assessment and Plan:  Assessment  ASSESSMENT: Lucas Cunningham is a 19 y.o. African-American young man with uncontrolled T1DM due to non-compliance and depression. He also has significant peripheral neuropathy and muscle weakness. He had a positive insulin antibodies consistent with type 1 diabetes, but also had a high C-peptide of 3.8 consistent with insulin resistance.    1. Uncontrolled type 1 diabetes with severe insulin resistance due to morbid obesity:  A. He says that he is currently taking his Tresiba dose of 100 units daily.   B. He is supposed to be taking Novolog insulin according to the plan that Dr. Baldo Ash established at his last visit, but has not been taking the sliding scale doses and may have been missing other doses as well.   - Change Novolog to fixed 10 units before meals plus sliding scale - Novolog sliding scale before meals as follows: 150-199 take 1 extra unit of Novolog- total 11 units 200- take 2 extra units of Novolog- total 12 units 300s- take 3 extra units of Novolog- total 13 units 400s- take 4 extra units of Novolog - total 14 units 500s- take 5 extra units of Novolog - total 15 units 600s/HI take 6 extra units of Novolog- total 16 units.  - Checks BGs before meals and at bedtime. - Bring in BG meter tomorrow and again next Wednesday.    C. I told Lucas Cunningham honestly today that if he does not take care of his DM and bring his BGs down, his DM will kill him within the next 3-4 years.  2. Peripheral neuropathy:   A. He has had leg/quadriceps weakness in the past, but now has problems with strength and sensation  in his right hand and sensation in his heels.  B. He was previously followed by Dr. Jordan Hawks, but has "aged out"  of that practice. He needs a referral to adult neurology.  3. Autonomic neuropathy: He has post-prandial bloating and dyspepsia, c/w autonomic neuropathy.  4. Non-compliance: We need to perform DM re-education for him and get him back on his Dexcom.  5. Tinea pedis: He has bilateral tinea.  PLAN: 1. Diagnostic: Grandmother will bring in his BG meter tomorrow and again next Wednesday. Will check fasting C-peptide, TFTS, urinary microalbumin, fasting lipids next week.  2. Therapeutic: Take Tyler Aas dose of 100 units/day. Resume Novolog aspart insulin by his fixed dose/sliding scale plan at mealtimes.  3. Patient/parent education: We discussed all of the above at great length. We will schedule him for DM re-education and Dexcom re-education. We will also have him seen by our dietitian. I would like to convert him to a Correction dose/Food Dose two-component method in the near future.  4. Follow up: one month  Level of Service: This visit lasted in excess of 110 minutes. More than 50% of the visit was devoted to counseling.  Follow up visit with me or with Dr. Baldo Ash in [place], MD, CDE Pediatric and Adult Endocrinology

## 2020-01-13 ENCOUNTER — Ambulatory Visit (INDEPENDENT_AMBULATORY_CARE_PROVIDER_SITE_OTHER): Payer: Medicaid Other | Admitting: Dietician

## 2020-01-13 NOTE — Progress Notes (Deleted)
   Medical Nutrition Therapy - Progress Note Appt start time: 12:30 PM Appt end time: 1:15 PM Reason for referral: Type 1 Diabetes Referring provider: Dr. Baldo Ash - Endo Pertinent medical hx: Obesity, acanthosis, insulin resistance, insulin-dependent type 2 diabetes  Assessment: Food allergies: none Pertinent Medications: see medication list Vitamins/Supplements: none Pertinent labs:  (11/8) POCT Glucose: 361 HIGH (11/8) Urinary ketones: trace (9/7) POCT Hgb A1c: 14.4 (1/12) POCT Glucose: 376 HIGH (1/12) POCT Hgb A1c: >14 HIGH (1/12) Urine ketones: large  (11/8) Anthropometrics: The child was weighed, measured, and plotted on the CDC growth chart. Ht: 181 cm (72 %)  Z-score: 0.61 Wt: 122.9 kg (99 %)  Z-score: 2.71 BMI: 37.5 (99 %)  Z-score: 2.49   126% of 95th% IBW based on BMI @ 85th%: 86.8 kg  (1/12) Anthropometrics: The child was weighed, measured, and plotted on the CDC growth chart. Ht: 179 cm (64 %)  Z-score: 0.37 Wt: 138.9 kg (99 %)  Z-score: 3.10 BMI: 43.3 (99 %)  Z-score: 2.91   149% of 95th% IBW based on BMI @ 85th%: 50.1 kg  (4/27) Wt: 160.1 kg  Estimated minimum caloric needs: 15 kcal/kg/day (TEE using IBW) Estimated minimum protein needs: 0.85 g/kg/day (DRI) Estimated minimum fluid needs: 28 mL/kg/day (Holliday Segar)  Primary concerns today: Follow up for obesity and type 1 diabetes. Grandmother accompanied pt to appt today. Joint visit with Dr. Lovena Le.  Dietary Intake Hx: Usual eating pattern includes: 3 meals and 1 snack per day. Pt lives with grandmother since mother passes. Preferred foods: pizza, sushi Avoided foods: did not ask Fast-food: 1x/month - [employer] (chicken breast, chicken leg, mac-n-cheese, fries, biscuit) 24-hr recall: 9 AM Breakfast: 3 eggs with cheese, 3 pieces of toast, butter and cheese, 3 sausage patties, coffee with powdered creamer and milk 10-11 PM Dinner: 2 ham sandwiches (2 slices bread with 2 slices ham, 1 slice of cheese, mayo)  with water 11 AM Lunch: 1/2 plate chicken thigh, 1/4 plate mac-n-cheese, 1/4 plate green beans 9 AM Breakfast: 3 eggs with cheese, 3 pieces of toast, butter and cheese, coffee with powdered creamer and milk Snacks: 2 packs Lance cheese crackers, sugar free wafers Beverages: 24 case of water lasts 2-3 days, 2 big cups 2% milk daily, occasional diet soda  Physical Activity: limited  GI: diarrhea  Estimated intake likely exceeding needs given obesity and weight gain.  Nutrition Diagnosis: (4/27) Food and nutrition related knowledge deficient related to difficulties counting carbohydrates as evidence by pt and caregiver report.  Intervention: Discussed current diet. Discussed logging carbs using specific food labels as practice. All questions answered, pt and grandmother verbalized understanding. Recommendations: - Start keeping a log of your carb counting. Bring this log to your next appointment - don't forget! - Look at your nutrition labels or google the labels. If you go out to eat, look at the restaurant's website. - Limit milk to 16 oz daily.   Teach back method used.  Monitoring/Evaluation: Goals to Monitor: - Growth trends - Lab values  Follow-up ***  Total time spent in counseling: *** minutes.

## 2020-01-15 ENCOUNTER — Other Ambulatory Visit: Payer: Self-pay

## 2020-01-15 ENCOUNTER — Encounter: Payer: Self-pay | Admitting: Rehabilitation

## 2020-01-15 ENCOUNTER — Ambulatory Visit: Payer: Medicaid Other | Admitting: Rehabilitation

## 2020-01-15 ENCOUNTER — Ambulatory Visit: Payer: Medicaid Other | Attending: Pediatrics | Admitting: Occupational Therapy

## 2020-01-15 DIAGNOSIS — R2689 Other abnormalities of gait and mobility: Secondary | ICD-10-CM | POA: Diagnosis present

## 2020-01-15 DIAGNOSIS — M21371 Foot drop, right foot: Secondary | ICD-10-CM | POA: Insufficient documentation

## 2020-01-15 DIAGNOSIS — R208 Other disturbances of skin sensation: Secondary | ICD-10-CM

## 2020-01-15 DIAGNOSIS — M6281 Muscle weakness (generalized): Secondary | ICD-10-CM | POA: Diagnosis present

## 2020-01-15 DIAGNOSIS — R278 Other lack of coordination: Secondary | ICD-10-CM | POA: Insufficient documentation

## 2020-01-15 DIAGNOSIS — M21372 Foot drop, left foot: Secondary | ICD-10-CM | POA: Insufficient documentation

## 2020-01-15 DIAGNOSIS — R2681 Unsteadiness on feet: Secondary | ICD-10-CM

## 2020-01-15 DIAGNOSIS — M25531 Pain in right wrist: Secondary | ICD-10-CM | POA: Diagnosis present

## 2020-01-15 DIAGNOSIS — R29818 Other symptoms and signs involving the nervous system: Secondary | ICD-10-CM | POA: Insufficient documentation

## 2020-01-15 NOTE — Therapy (Signed)
[facility] [address] [place], [place], [address] Phone: [phone]   Fax:  [phone]  Physical Therapy Evaluation  Patient Details  Name: Lucas Cunningham MRN: [id number] Date of Birth: 12/15/2000 Referring Provider (PT): Lance Morin Torric NP   Encounter Date: 01/15/2020   PT End of Session - 01/15/20 1359    Visit Number 1    Number of Visits 17    Authorization Type [employer] (awaiting auth)    PT Start Time 1103    PT Stop Time 1148    PT Time Calculation (min) 45 min    Activity Tolerance Patient tolerated treatment well;Patient limited by fatigue    Behavior During Therapy [facility] for tasks assessed/performed           Past Medical History:  Diagnosis Date   Diabetes mellitus without complication (HCC)    type 1    History reviewed. No pertinent surgical history.  There were no vitals filed for this visit.    Subjective Assessment - 01/15/20 1107    Subjective Pt reports heaviness and weakness in BLEs and in R hand.  Does note an itchy sensation in LEs at times that comes and goes.    Patient is accompained by: Family member   [place], grandmother   Limitations Walking;Standing;House hold activities    How long can you stand comfortably? 10 mins    How long can you walk comfortably? 5 mins    Patient Stated Goals "I want to be able to bring in groceries and help around the house."    Currently in Pain? No/denies   does have pain most mornings, from stiffness in legs.             [facility] PT Assessment - 01/15/20 1113      Assessment   Medical Diagnosis Diabetic neuropathy    Referring Provider (PT) Lance Morin Torric NP    Onset Date/Surgical Date --   he notes a change in function 2020     Precautions   Precautions Fall      Balance Screen   Has the patient fallen in the past 6 months Yes    How many times? 2-3    Has the patient had a decrease in activity  level because of a fear of falling?  Yes    Is the patient reluctant to leave their home because of a fear of falling?  Yes      Home Environment   Living Environment Private residence    Living Arrangements --   Grandmother    Available Help at Discharge Family;Available PRN/intermittently    Type of Home House    Home Access Stairs to enter    Entrance Stairs-Number of Steps 3    Entrance Stairs-Rails Right;Left;Can reach both    Home Layout One level   one step inside house   Home Equipment Walker - 2 wheels;Grab bars - tub/shower    Additional Comments grandmothers house is small, he doesn't use RW in home because he doesnt want to      Prior Function   Level of Independence Independent with basic ADLs    Vocation --   unsure if he has graduated, wants to do [employer]    Leisure Go out with friends, play video games, playing outside       Cognition   Overall Cognitive Status --   more anxious about falling      Sensation   Light Touch Impaired  Detail    Light Touch Impaired Details Impaired RUE;Impaired RLE;Impaired LLE    Hot/Cold Impaired Detail    Hot/Cold Impaired Details Impaired RUE    Proprioception Appears Intact   per report      Coordination   Gross Motor Movements are Fluid and Coordinated Yes    Heel Shin Test within functional limits       Posture/Postural Control   Posture/Postural Control Postural limitations    Postural Limitations Rounded Shoulders;Forward head      ROM / Strength   AROM / PROM / Strength AROM;Strength      AROM   Overall AROM  Deficits    Overall AROM Comments can get slightly past neutral DF on BLEs       Strength   Overall Strength Deficits    Overall Strength Comments B hip flex, B knee ext, B knee flex all 3+/5 (reduced ROM at hip), ankle DF 1/5 BLE, PF 1/5 BLE      Transfers   Transfers Sit to Stand;Stand to Sit    Sit to Stand 6: Modified independent (Device/Increase time)    Five time sit to stand comments  21.75 with BUEs      Stand to Sit 6: Modified independent (Device/Increase time)    Comments heavy reliance on UEs       Ambulation/Gait   Ambulation/Gait Yes    Ambulation/Gait Assistance 5: Supervision;4: Min guard    Ambulation/Gait Assistance Details Pt has B steppage gait due to inadequate DF B with forward flexed posture.  He reports he does not use RW at home and tends to walk with hands on walls.  Educated on safety and not relying on external supports but using RW at all times.      Ambulation Distance (Feet) 100 Feet    Assistive device Rolling walker    Gait Pattern Step-through pattern;Decreased stride length;Decreased dorsiflexion - right;Decreased dorsiflexion - left;Right steppage;Left steppage;Trunk flexed;Wide base of support    Ambulation Surface Level;Indoor    Gait velocity 1.56 ft/sec with RW at S level     Stairs Yes    Stairs Assistance 4: Min assist    Stairs Assistance Details (indicate cue type and reason) Pt only able to do single step due to weakness.  he reports grandmothers steps are much smaller than clinics.      Stair Management Technique Two rails;Step to pattern;Forwards;Backwards    Number of Stairs 1    Height of Stairs 6                      Objective measurements completed on examination: See above findings.               PT Education - 01/15/20 1358    Education Details Disussed evaluation findings, educated on neuropathy and how it plays a part in decreasing balance and strength, discussed the need for B AFOs and would trial at next session to dec fall risk, goals and POC.    Person(s) Educated Patient;Caregiver(s)    Methods Explanation    Comprehension Verbalized understanding;Need further instruction            PT Short Term Goals - 01/15/20 1417      PT SHORT TERM GOAL #1   Title Pt will be IND with initial HEP in order to indicate improved function and balance (Target Date: 02/14/2020)    Baseline dependent    Time 4    Period  Weeks  Status New    Target Date 02/14/20      PT SHORT TERM GOAL #2   Title Will assess B AFOs and get [employer] involved as needed to decrease fall risk and improve efficiency of gait.    Baseline did not have time to assess    Time 4    Period Weeks    Status New      PT SHORT TERM GOAL #3   Title Pt will improve gait speed to >/= 2.16 ft/sec w/ LRAD and B AFO as needed to indicate dec fall risk.    Baseline 1.56 ft/sec with RW, no AFOs    Time 4    Period Weeks    Status New      PT SHORT TERM GOAL #4   Title Pt will improve 5TSS to </=18 secs with BUE support in order to indicate improved strength.    Baseline 21.75 secs with BUE support    Time 4    Period Weeks    Status New      PT SHORT TERM GOAL #5   Title Will assess TUG and update STG/LTG as able    Baseline did not have time to assess    Time 4    Period Weeks    Status New      Additional Short Term Goals   Additional Short Term Goals Yes      PT SHORT TERM GOAL #6   Title Pt will be able to stand statically without support x 15 secs in order to indicate improved balance and strength.    Baseline 2-3 secs only without LOB posteriorly    Time 4    Period Weeks    Status New             PT Long Term Goals - 01/15/20 1424      PT LONG TERM GOAL #1   Title Pt will be IND with final HEP in order to indicate improved functional mobility and dec fall risk.  (Target Date: 03/15/20)    Baseline dependent    Time 8    Period Weeks    Status New    Target Date 03/15/20      PT LONG TERM GOAL #2   Title Pt will improve gait speed to >/=2.62 ft/sec with LRAD and B AFOs as needed in order to indicate safe community ambulation.    Baseline 1.56 ft/sec withRW and no AFOs    Time 8    Period Weeks    Status New      PT LONG TERM GOAL #3   Title Pt will improve 5TSS to </=16 secs with single UE support only in order to indicate improved functional strength.    Baseline 21.75 secs with BUE support    Time 8     Period Weeks    Status New      PT LONG TERM GOAL #4   Title Pt will ambulate x 500' over unlevel paved surfaces w/ LRAD and B AFOs as needed at mod I level in order to indicate safe community mobility.    Baseline unable to assess, but is fatigued with 100' indoor gait with RW    Time 8    Period Weeks    Status New      PT LONG TERM GOAL #5   Title Pt will perform dynamic standing balance with intermittent UE support x 5 mins in order to perform ADLs safely at home.  Baseline Needs BUE support as this time, unable to stand statically without support    Time 8    Period Weeks    Status New                  Plan - 01/15/20 1400    Clinical Impression Statement Pt presents with recent diagnosis of Type I DM, which was uncontrolled for an unknown amount of time.  He reports he began to note issues with strength and balance in 2020.  He has history of ADHD and also had Covid in Sept.  Note that his mother passed away in Oct 21, 2022 and he is now living with Grandmother.  Upon PT evaluation, note marked weakness in BLEs with pt only having 3+/5 at most strength in BLEs and only trace DF/PF causing him to rely on UEs for mobility and extreme steppage gait.  Note gait speed of 1.56 ft/sec with RW indicative of high fall risk.  He is unable to stand statically without UE support due to weakness in LEs.  Pt will benefit from skilled OP neuro to address deficits.    Personal Factors and Comorbidities Comorbidity 3+;Time since onset of injury/illness/exacerbation    Comorbidities see above    Examination-Activity Limitations Bathing;Dressing;Locomotion Level;Transfers;Squat;Stairs;Stand    Examination-Participation Restrictions Community Activity;Driving;School;Yard Work   Associate Professor Evolving/Moderate complexity    Clinical Decision Making Moderate    Rehab Potential Good    PT Frequency 2x / week    PT Duration 8 weeks    PT Treatment/Interventions  ADLs/Self Care Home Management;DME Instruction;Gait training;Stair training;Functional mobility training;Therapeutic activities;Therapeutic exercise;Balance training;Neuromuscular re-education;Patient/family education;Orthotic Fit/Training;Passive range of motion;Vestibular    PT Next Visit Plan He is going to be unable to complete a BERG at this time, he could maybe do a DGI with RW??, assess gait with AFOs (start light and go more restrictive as needed, unsure if we need custom), TUG, HEP for strength, balance as able.    Consulted and Agree with Plan of Care Patient;Family member/caregiver    Family Member Consulted grandmother           Patient will benefit from skilled therapeutic intervention in order to improve the following deficits and impairments:  Abnormal gait, Decreased activity tolerance, Decreased balance, Decreased endurance, Decreased knowledge of precautions, Decreased knowledge of use of DME, Decreased mobility, Decreased safety awareness, Decreased strength, Difficulty walking, Impaired perceived functional ability, Impaired sensation, Impaired UE functional use, Postural dysfunction  Visit Diagnosis: Other disturbances of skin sensation  Unsteadiness on feet  Foot drop, left  Foot drop, right  Other abnormalities of gait and mobility     Problem List Patient Active Problem List   Diagnosis Date Noted   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) 12/03/2019   DKA, type 1 (HCC) 10/23/2019   DKA (diabetic ketoacidosis) (HCC) 10/21/2019   COVID-19 virus infection 10/21/2019   Diabetic polyneuropathy associated with type 1 diabetes mellitus (HCC) 02/26/2019   Leg weakness, bilateral 02/25/2019   Postprandial bloating 02/25/2019   Sensory problems with limbs 02/25/2019   Ketonuria 11/04/2018   Diabetes (HCC) 04/23/2018   Uncontrolled type 1 diabetes mellitus with hyperglycemia (HCC) 05/24/2017   Morbid childhood obesity with  BMI greater than 99th percentile for age [facility]) 05/24/2017   Insulin resistance 05/24/2017   Acanthosis 05/24/2017    Harriet Butte, PT, MPT [facility] [address] [place], [place], [address] Phone: [phone]   Fax:  [phone]  01/15/20, 2:29 PM  Name: Lucas Cunningham MRN: [id number] Date of Birth: 2001/01/08   [facility] Authorization Peds  Choose one: Neuro Rehabilitative  Standardized Assessment: Other: gait speed 1.56 ft/sec, 5TSS 21.75 secs w/ BUE support  Standardized Assessment Documents a Deficit and or below the 10th percentile (>1.5 standard deviations below normal for the patient's age)? Yes   Please select the following statement that best describes the patient's presentation or goal of treatment: There has been a recent decline in function requiring skilled care  OT: Choose one: N/A  SLP: Choose one: N/A  Please rate overall deficits/functional limitations: severe

## 2020-01-15 NOTE — Therapy (Signed)
[facility] [address] [place], [place], [address] Phone: [phone]   Fax:  [phone]  Occupational Therapy Evaluation  Patient Details  Name: Lucas Cunningham MRN: [id number] Date of Birth: 03-19-2000 Referring Provider (OT): Frederico Hamman, NP   Encounter Date: 01/15/2020   OT End of Session - 01/15/20 1224    Visit Number 1    Number of Visits 17    Date for OT Re-Evaluation 03/11/20    Authorization Type [employer]    Authorization Time Period 27 OT/PT/ST    OT Start Time 1147    OT Stop Time 1230    OT Time Calculation (min) 43 min    Activity Tolerance Patient tolerated treatment well    Behavior During Therapy [facility] for tasks assessed/performed           Past Medical History:  Diagnosis Date  . Diabetes mellitus without complication (HCC)    type 1    No past surgical history on file.  There were no vitals filed for this visit.   Subjective Assessment - 01/15/20 1457    Subjective  Pt is a 19 year old male that presents to neuro OPOT s/p functional decline in 2020 and peripheral neuropathy d/t uncontrolled Type I diabetes. PMH present for depression and TIDM. Pt reports he is having difficulty using his right hand and would like to improve his use of the right hand.    Patient is accompanied by: Family member   grandmother   Pertinent History PMH Type I diabetes, depression, peripheral neuropathy    Limitations Fall Risk. Sensation deficits in RUE    Patient Stated Goals Pt wants "to be able to grab things and write and use my fingers"    Currently in Pain? Yes    Pain Score 9    at the worse in the RUE wrist/hand. no pain at rest   Pain Location Wrist    Pain Orientation Right    Pain Descriptors / Indicators Tightness    Pain Type Neuropathic pain    Pain Onset More than a month ago    Pain Frequency Intermittent             [facility] OT Assessment - 01/15/20 1153      Assessment    Medical Diagnosis Diabetic Neuropathy    Referring Provider (OT) Frederico Hamman, NP    Hand Dominance Right      Precautions   Precautions Fall    Precaution Comments Fall Risk. Not Driving. Sensation deficits in RUE ulnar deviation     Required Braces or Orthoses Other Brace/Splint    Other Brace/Splint wrist brace for RUE - unsure if he can locate      Balance Screen   Has the patient fallen in the past 6 months Yes   PT seeing patient   How many times? 1      Home  Environment   Family/patient expects to be discharged to: Private residence    Living Arrangements Other relatives    Available Help at Discharge Family    Type of Home House    Home Access Stairs   3   Home Layout One level    Bathroom Shower/Tub Tub/Shower unit    Chartered certified accountant - 2 wheels;Grab bars - tub/shower    Lives With Family   lives with his grandmother.     Prior Function   Level of Independence Independent with basic  ADLs    Vocation Unemployed   not currently in school or working   Leisure play video games, go out with friends to mall       ADL   Eating/Feeding Needs assist with cutting food    Grooming Modified independent    Upper Body Bathing Modified independent    Lower Body Bathing Modified independent   unsteady on feet and would recommend supervision for safety   Upper Body Dressing Needs assist for fasteners    Lower Body Dressing Needs assist for fasteners    Toilet Transfer Modified independent    Toileting - Clothing Manipulation Modified independent    Toileting -  Hygiene Modified Independent    Tub/Shower Transfer Modified independent    ADL comments Pt reports doing all ADLs independently however secondary to unsteadiness on feet would recommend supervision or adaptive equipment (shower seat) for safety      IADL   Prior Level of Function Shopping has not been responsible for groceries, etc.    Shopping Needs to be accompanied on any shopping  trip    Prior Level of Function Light Housekeeping has not been responsible for maintaining house    Light Housekeeping Performs light daily tasks but cannot maintain acceptable level of cleanliness   reports doing laundry one handed.   Prior Level of Function Meal Prep Used to cook    Meal Prep Able to complete simple cold meal and snack prep;Needs to have meals prepared and served   unable to open bread   Community Mobility Relies on family or friends for transportation    Prior Level of Function Medication Managment uncontrolled diabetes secondary to compliance but pt reports he is doing better    Medication Management Has difficulty remembering to take medication    Prior Level of Function Financial Management has not been responsible for finances    Financial Management Requires assistance      Mobility   Mobility Status History of falls;Needs assist    Mobility Status Comments amblating with RW in therapy. does not use RW at home per pt report      Written Expression   Dominant Hand Right    Handwriting Not legible      Vision Assessment   Comment Vision was not assessed. Will assess in functional context       Posture/Postural Control   Posture/Postural Control Postural limitations    Postural Limitations Rounded Shoulders;Forward head      Sensation   Light Touch Impaired Detail    Light Touch Impaired Details Impaired RUE    Hot/Cold Impaired Detail    Hot/Cold Impaired Details Impaired RUE    Proprioception Appears Intact      Coordination   Box and Blocks RUE 6; LUE 41      Perception   Perception --      Edema   Edema RUE some edema present      ROM / Strength   AROM / PROM / Strength AROM;Strength      AROM   Overall AROM  Deficits    Overall AROM Comments LUE WFL  RUE w deficits    AROM Assessment Site Wrist    Right/Left Wrist Right      Strength   Overall Strength Deficits    Overall Strength Comments LUE WFL  RUE w deficits    Strength  Assessment Site Shoulder;Elbow;Wrist;Hand    Right/Left Shoulder Right    Right Shoulder Flexion 4+/5    Right Shoulder  ABduction 4+/5    Right/Left Elbow Right    Right Elbow Flexion 3+/5    Right Elbow Extension 3+/5    Right/Left Wrist Right    Right Wrist Flexion 3/5    Right Wrist Extension 1/5      Right Hand AROM   R Thumb Opposition to Index --   absent opposition on gross evaluation/assessment   R Index  MCP 0-90 --   absent finger flexion      Hand Function   Right Hand Gross Grasp Impaired    Right Hand Grip (lbs) 5    Left Hand Gross Grasp Functional    Left Hand Grip (lbs) 29.5    Comment RUE gross fist approx 90% with exception of thumb and index (absent flexion on gross assessment) and extension of hand approx 80%                              OT Short Term Goals - 01/15/20 1500      OT SHORT TERM GOAL #1   Title Pt will be independent with initial HEP targeting grip strength and range of motion    Baseline not issued yet    Time 4    Period Weeks    Status New    Target Date 02/12/20      OT SHORT TERM GOAL #2   Title Pt will verbalize understanding of sensory strategies for RUE and safety    Baseline have not reviewed    Time 4    Period Weeks    Status New      OT SHORT TERM GOAL #3   Title Pt will increase grip strength in LUE by 5 lbs and RUE by 2 lbs for increase in functional use of BUE for ADLs and IADLs    Baseline RUE 5 LUE 29.5    Time 4    Period Weeks    Status New      OT SHORT TERM GOAL #4   Title Pt will perform donning and doffing socks and shoes with supervision for increase in independence with ADLs    Baseline unable to don socks consistently    Time 4    Period Weeks    Status New      OT SHORT TERM GOAL #5   Title Pt will perform simple cold meal prep (sandwich) and/or light housekeeping with supervision    Baseline not completing at this time    Time 4    Period Weeks    Status New      OT SHORT  TERM GOAL #6   Title --    Baseline --    Time --    Period --    Status --             OT Long Term Goals - 01/15/20 1505      OT LONG TERM GOAL #1   Title Pt will be independent with updated HEP 03/11/2020    Baseline not issued yet    Time 8    Period Weeks    Status New    Target Date 03/11/20      OT LONG TERM GOAL #2   Title Pt will perform simple warm meal prep and/or light house keeping task with mod I and good safety awareness    Baseline not completing at eval    Time 8    Period Weeks  Status New      OT LONG TERM GOAL #3   Title Pt will improve grip strengthening in LUE by 10 lbs and RUE by 6 lbs for increased ability to perform clothing management for ADLs    Baseline LUE 29.5 RUE 5    Time 8    Period Weeks    Status New      OT LONG TERM GOAL #4   Title Pt will increased box and blocks score in RUE to 12 blocks or more for increase in functional use of RUE    Baseline RUE 6    Time 8    Period Weeks    Status New      OT LONG TERM GOAL #5   Title Pt will report decrease pain in RUE wrist with functional use to no more than 6/10    Baseline 9/10    Time 8    Period Weeks    Status New      OT LONG TERM GOAL #6   Title Pt will complete basic ADLs mod Independent with adapted equipment and strategies PRN    Baseline difficulty with donning socks, cutting up food and fasteners    Time 8    Period Weeks    Status New                 Plan - 01/15/20 1451    Clinical Impression Statement Pt is a 19 year old male that presents to neuro OPOT s/p functional decline in 2020 and peripheral neuropathy d/t uncontrolled Type I diabetes. PMH present for depression and TIDM. Pt presents with decline in independence with ADLs and IADLs with decreased range of motion, strength and sensation with RUE. Skilled OT is recommended to target these areas of deficit and increase independence and decrease caregiver burden.    OT Occupational Profile and History  Problem Focused Assessment - Including review of records relating to presenting problem    Occupational performance deficits (Please refer to evaluation for details): IADL's;ADL's;Leisure;Social Participation    Body Structure / Function / Physical Skills ADL;Balance;Coordination;Decreased knowledge of use of DME;Edema;Flexibility;FMC;Dexterity;Strength;GMC;Pain;Tone;UE functional use;ROM;IADL;Sensation    Psychosocial Skills Coping Strategies    Rehab Potential Good    Clinical Decision Making Limited treatment options, no task modification necessary    Comorbidities Affecting Occupational Performance: None    Modification or Assistance to Complete Evaluation  No modification of tasks or assist necessary to complete eval    OT Frequency 2x / week    OT Duration 8 weeks    OT Treatment/Interventions Self-care/ADL training;Moist Heat;DME and/or AE instruction;Splinting;Balance training;Therapeutic activities;Aquatic Therapy;Therapeutic exercise;Ultrasound;Electrical Stimulation;Cryotherapy;Neuromuscular education;Paraffin;Energy conservation;Manual Therapy;Patient/family education;Passive range of motion;Functional Mobility Training    Plan grip strengthening BUE HEP, sensory strategies for RUE    Recommended Other Services Pt is receiving PT    Consulted and Agree with Plan of Care Patient;Family member/caregiver    Family Member Consulted grandmother           Patient will benefit from skilled therapeutic intervention in order to improve the following deficits and impairments:   Body Structure / Function / Physical Skills: ADL, Balance, Coordination, Decreased knowledge of use of DME, Edema, Flexibility, FMC, Dexterity, Strength, GMC, Pain, Tone, UE functional use, ROM, IADL, Sensation   Psychosocial Skills: Coping Strategies   Visit Diagnosis: Muscle weakness (generalized) - Plan: Ot plan of care cert/re-cert  Other abnormalities of gait and mobility - Plan: Ot plan of care  cert/re-cert  Other lack of coordination -  Plan: Ot plan of care cert/re-cert  Unsteadiness on feet - Plan: Ot plan of care cert/re-cert  Other disturbances of skin sensation - Plan: Ot plan of care cert/re-cert  Other symptoms and signs involving the nervous system - Plan: Ot plan of care cert/re-cert  Pain in right wrist - Plan: Ot plan of care cert/re-cert    Problem List Patient Active Problem List   Diagnosis Date Noted  . 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 DKA, type 1 (HCC) 10/23/2019  . DKA (diabetic ketoacidosis) (HCC) 10/21/2019  . COVID-19 virus infection 10/21/2019  . Diabetic polyneuropathy associated with type 1 diabetes mellitus (HCC) 02/26/2019  . Leg weakness, bilateral 02/25/2019  . Postprandial bloating 02/25/2019  . Sensory problems with limbs 02/25/2019  . Ketonuria 11/04/2018  . Diabetes (HCC) 04/23/2018  . Uncontrolled type 1 diabetes mellitus with hyperglycemia (HCC) 05/24/2017  . Morbid childhood obesity with BMI greater than 99th percentile for age [facility]) 05/24/2017  . Insulin resistance 05/24/2017  . Acanthosis 05/24/2017    [employer] Authorization Peds  Choose one: Neuro Rehabilitative  Standardized Assessment: Other: Range of Motion, Box and Blocks, MMT, Dynamometer Grip Strength measurements  Standardized Assessment Documents a Deficit and or below the 10th percentile (>1.5 standard deviations below normal for the patient's age)? Yes   Please select the following statement that best describes the patient's presentation or goal of treatment: There has been a recent decline in function requiring skilled care  OT: Choose one: Pt requires human assistance for age appropriate basic activities of daily living   Please rate overall deficits/functional limitations: moderate     Junious Dresser MOT, OTR/L  01/15/2020, 3:19 PM  [facility] [address] [place], [place], [address] Phone: [phone]   Fax:  [phone]  Name: Lucas Cunningham MRN: [id number] Date of Birth: 14-May-2000

## 2020-01-16 ENCOUNTER — Ambulatory Visit (INDEPENDENT_AMBULATORY_CARE_PROVIDER_SITE_OTHER): Payer: Medicaid Other | Admitting: Pharmacist

## 2020-01-16 VITALS — Wt 269.0 lb

## 2020-01-16 DIAGNOSIS — E1065 Type 1 diabetes mellitus with hyperglycemia: Secondary | ICD-10-CM

## 2020-01-16 LAB — POCT URINALYSIS DIPSTICK: Glucose, UA: POSITIVE — AB

## 2020-01-16 LAB — POCT GLUCOSE (DEVICE FOR HOME USE): POC Glucose: 363 mg/dl — AB (ref 70–99)

## 2020-01-16 NOTE — Patient Instructions (Addendum)
It was a pleasure seeing you today!  Today the plan is.. 1. Remember to look at chart to figure out carb dose and correction dose.  2. For your carbs, for breakfast you typically eat 22 g (3 units) and for lunch/dinner you eat 104 g (19 units). If you eat just 1 bag of ramen that is 52 g of carb (9 units) 3. Take one tray of Afrezza out at a time (will last 10 days). When you open each little pack of 3 Afrezza that will last 3 days. When you are down to last 3 packs take out a new Afrezza try 4. Increase Tresiba from 90 units daily to 100 unit daily 5. Remember to charge Dexcom   Please contact me (Dr. Ladona Ridgel) at [phone] or via Mychart with any questions/concerns

## 2020-01-21 ENCOUNTER — Ambulatory Visit: Payer: Medicaid Other | Admitting: Occupational Therapy

## 2020-01-21 ENCOUNTER — Ambulatory Visit: Payer: Medicaid Other | Admitting: Physical Therapy

## 2020-01-21 ENCOUNTER — Encounter: Payer: Self-pay | Admitting: Occupational Therapy

## 2020-01-21 ENCOUNTER — Encounter: Payer: Self-pay | Admitting: Physical Therapy

## 2020-01-21 ENCOUNTER — Other Ambulatory Visit: Payer: Self-pay

## 2020-01-21 DIAGNOSIS — M6281 Muscle weakness (generalized): Secondary | ICD-10-CM | POA: Diagnosis not present

## 2020-01-21 DIAGNOSIS — R208 Other disturbances of skin sensation: Secondary | ICD-10-CM

## 2020-01-21 DIAGNOSIS — R2681 Unsteadiness on feet: Secondary | ICD-10-CM

## 2020-01-21 DIAGNOSIS — M25531 Pain in right wrist: Secondary | ICD-10-CM

## 2020-01-21 DIAGNOSIS — R278 Other lack of coordination: Secondary | ICD-10-CM

## 2020-01-21 DIAGNOSIS — M21372 Foot drop, left foot: Secondary | ICD-10-CM

## 2020-01-21 DIAGNOSIS — R29818 Other symptoms and signs involving the nervous system: Secondary | ICD-10-CM

## 2020-01-21 DIAGNOSIS — R2689 Other abnormalities of gait and mobility: Secondary | ICD-10-CM

## 2020-01-21 DIAGNOSIS — M21371 Foot drop, right foot: Secondary | ICD-10-CM

## 2020-01-21 NOTE — Therapy (Signed)
[facility] [address] [place], [place], [address] Phone: [phone]   Fax:  [phone]  Occupational Therapy Treatment  Patient Details  Name: Lucas Cunningham MRN: [id number] Date of Birth: 21-Dec-2000 Referring Provider (OT): Frederico Hamman, NP   Encounter Date: 01/21/2020   OT End of Session - 01/21/20 1451    Visit Number 2    Number of Visits 17    Date for OT Re-Evaluation 03/11/20    Authorization Type [employer]    Authorization Time Period 27 OT/PT/ST    OT Start Time 1650    OT Stop Time 1730    OT Time Calculation (min) 40 min    Activity Tolerance Patient tolerated treatment well    Behavior During Therapy [facility] for tasks assessed/performed           Past Medical History:  Diagnosis Date  . Diabetes mellitus without complication (HCC)    type 1    History reviewed. No pertinent surgical history.  There were no vitals filed for this visit.   Subjective Assessment - 01/21/20 1453    Subjective  Pt reports pain when his fingers involuntarily extend back    Patient is accompanied by: Family member   grandmother   Pertinent History PMH Type I diabetes, depression, peripheral neuropathy    Limitations Fall Risk. Sensation deficits in RUE    Patient Stated Goals Pt wants "to be able to grab things and write and use my fingers"    Currently in Pain? Yes    Pain Score 7     Pain Location Finger (Comment which one)    Pain Orientation Right    Pain Descriptors / Indicators Burning    Pain Type Neuropathic pain    Pain Onset More than a month ago    Pain Frequency Intermittent                        OT Treatments/Exercises (OP) - 01/21/20 1703      ADLs   Grooming Total A For clipping fingernails and grooming hands this day. Pt and grandmother asked for assistance with this task as they do not have the ability to complete at home      Exercises   Exercises Wrist       Wrist Exercises   Other wrist exercises AROM for wrist extension/flexion and supination/pronation. Pt with limited active movement in RUE wrist extension and poor strength in all other directions/movements      Modalities   Modalities Electrical Stimulation      Electrical Stimulation   Electrical Stimulation Location RUE wrist extensor and briefly on flexors. good movement and response from ESTIM this day.    Control and instrumentation engineer Parameters 50 pps NMES level 13-16    Electrical Stimulation Goals Strength;Neuromuscular facilitation                    OT Short Term Goals - 01/15/20 1500      OT SHORT TERM GOAL #1   Title Pt will be independent with initial HEP targeting grip strength and range of motion    Baseline not issued yet    Time 4    Period Weeks    Status New    Target Date 02/12/20      OT SHORT TERM GOAL #2   Title Pt will verbalize understanding of sensory strategies for RUE and safety  Baseline have not reviewed    Time 4    Period Weeks    Status New      OT SHORT TERM GOAL #3   Title Pt will increase grip strength in LUE by 5 lbs and RUE by 2 lbs for increase in functional use of BUE for ADLs and IADLs    Baseline RUE 5 LUE 29.5    Time 4    Period Weeks    Status New      OT SHORT TERM GOAL #4   Title Pt will perform donning and doffing socks and shoes with supervision for increase in independence with ADLs    Baseline unable to don socks consistently    Time 4    Period Weeks    Status New      OT SHORT TERM GOAL #5   Title Pt will perform simple cold meal prep (sandwich) and/or light housekeeping with supervision    Baseline not completing at this time    Time 4    Period Weeks    Status New      OT SHORT TERM GOAL #6   Title --    Baseline --    Time --    Period --    Status --             OT Long Term Goals - 01/15/20 1505      OT LONG TERM GOAL #1   Title  Pt will be independent with updated HEP 03/11/2020    Baseline not issued yet    Time 8    Period Weeks    Status New    Target Date 03/11/20      OT LONG TERM GOAL #2   Title Pt will perform simple warm meal prep and/or light house keeping task with mod I and good safety awareness    Baseline not completing at eval    Time 8    Period Weeks    Status New      OT LONG TERM GOAL #3   Title Pt will improve grip strengthening in LUE by 10 lbs and RUE by 6 lbs for increased ability to perform clothing management for ADLs    Baseline LUE 29.5 RUE 5    Time 8    Period Weeks    Status New      OT LONG TERM GOAL #4   Title Pt will increased box and blocks score in RUE to 12 blocks or more for increase in functional use of RUE    Baseline RUE 6    Time 8    Period Weeks    Status New      OT LONG TERM GOAL #5   Title Pt will report decrease pain in RUE wrist with functional use to no more than 6/10    Baseline 9/10    Time 8    Period Weeks    Status New      OT LONG TERM GOAL #6   Title Pt will complete basic ADLs mod Independent with adapted equipment and strategies PRN    Baseline difficulty with donning socks, cutting up food and fasteners    Time 8    Period Weeks    Status New                 Plan - 01/21/20 1707    Clinical Impression Statement Pt with good response with ESTIM this day with residual activation and increased  movement after session and modality.    OT Occupational Profile and History Problem Focused Assessment - Including review of records relating to presenting problem    Occupational performance deficits (Please refer to evaluation for details): IADL's;ADL's;Leisure;Social Participation    Body Structure / Function / Physical Skills ADL;Balance;Coordination;Decreased knowledge of use of DME;Edema;Flexibility;FMC;Dexterity;Strength;GMC;Pain;Tone;UE functional use;ROM;IADL;Sensation    Psychosocial Skills Coping Strategies    Rehab Potential Good     Clinical Decision Making Limited treatment options, no task modification necessary    Comorbidities Affecting Occupational Performance: None    Modification or Assistance to Complete Evaluation  No modification of tasks or assist necessary to complete eval    OT Frequency 2x / week    OT Duration 8 weeks    OT Treatment/Interventions Self-care/ADL training;Moist Heat;DME and/or AE instruction;Splinting;Balance training;Therapeutic activities;Aquatic Therapy;Therapeutic exercise;Ultrasound;Electrical Stimulation;Cryotherapy;Neuromuscular education;Paraffin;Energy conservation;Manual Therapy;Patient/family education;Passive range of motion;Functional Mobility Training    Plan grip strengthening BUE HEP, sensory strategies for RUE, ESTIM    Recommended Other Services Pt is receiving PT    Consulted and Agree with Plan of Care Patient;Family member/caregiver    Family Member Consulted grandmother           Patient will benefit from skilled therapeutic intervention in order to improve the following deficits and impairments:   Body Structure / Function / Physical Skills: ADL, Balance, Coordination, Decreased knowledge of use of DME, Edema, Flexibility, FMC, Dexterity, Strength, GMC, Pain, Tone, UE functional use, ROM, IADL, Sensation   Psychosocial Skills: Coping Strategies   Visit Diagnosis: Unsteadiness on feet  Muscle weakness (generalized)  Other disturbances of skin sensation  Other lack of coordination  Pain in right wrist  Other symptoms and signs involving the nervous system    Problem List Patient Active Problem List   Diagnosis Date Noted  . 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 DKA, type 1 (HCC) 10/23/2019  . DKA (diabetic ketoacidosis) (HCC) 10/21/2019  . COVID-19 virus infection 10/21/2019  . Diabetic polyneuropathy associated with type 1 diabetes mellitus (HCC) 02/26/2019  . Leg weakness, bilateral 02/25/2019  .  Postprandial bloating 02/25/2019  . Sensory problems with limbs 02/25/2019  . Ketonuria 11/04/2018  . Diabetes (HCC) 04/23/2018  . Uncontrolled type 1 diabetes mellitus with hyperglycemia (HCC) 05/24/2017  . Morbid childhood obesity with BMI greater than 99th percentile for age [facility]) 05/24/2017  . Insulin resistance 05/24/2017  . Acanthosis 05/24/2017    Junious Dresser MOT, OTR/L  01/21/2020, 5:09 PM  [facility] [address] [place], [place], [address] Phone: [phone]   Fax:  [phone]  Name: Lucas Cunningham MRN: [id number] Date of Birth: Jan 06, 2001

## 2020-01-21 NOTE — Patient Instructions (Signed)
Access Code: [email or web address] URL: [email or web address] Date: 01/21/2020 Prepared by: Sallyanne Kuster  Exercises Seated Heel Raise - 1 x daily - 5 x weekly - 1 sets - 10 reps Seated Ankle Plantarflexion with Resistance - 1 x daily - 5 x weekly - 1 sets - 10 reps Seated Knee Extension with Anchored Resistance - 1 x daily - 5 x weekly - 1 sets - 10 reps Seated Knee Flexion with Anchored Resistance - 1 x daily - 5 x weekly - 1 sets - 10 reps Standing March with Counter Support - 1 x daily - 5 x weekly - 1 sets - 10 reps Standing Hip Abduction with Counter Support - 1 x daily - 5 x weekly - 1 sets - 10 reps

## 2020-01-21 NOTE — Therapy (Signed)
[facility] [address] [place], [place], [address] Phone: [phone]   Fax:  [phone]  Physical Therapy Treatment  Patient Details  Name: Lucas Cunningham MRN: [id number] Date of Birth: 17-Jul-2000 Referring Provider (PT): Lance Morin Torric NP   Encounter Date: 01/21/2020   PT End of Session - 01/21/20 1539    Visit Number 2    Number of Visits 17    Authorization Type [employer]    Authorization Time Period 16 visits 12/8-2/02/22    Authorization - Visit Number 1    Authorization - Number of Visits 16    PT Start Time 1533    PT Stop Time 1616    PT Time Calculation (min) 43 min    Activity Tolerance Patient tolerated treatment well;Patient limited by fatigue    Behavior During Therapy [facility] for tasks assessed/performed           Past Medical History:  Diagnosis Date  . Diabetes mellitus without complication (HCC)    type 1    History reviewed. No pertinent surgical history.  There were no vitals filed for this visit.   Subjective Assessment - 01/21/20 1537    Subjective No new complaints. Continues to report the itching sensation in bil LE's that comes and goes. Heaviness and weakness in more in the am and gets better throughout the day.    Patient is accompained by: Family member   [place], grandmother   Limitations Walking;Standing;House hold activities    How long can you stand comfortably? 10 mins    How long can you walk comfortably? 5 mins    Patient Stated Goals "I want to be able to bring in groceries and help around the house."    Currently in Pain? No/denies    Pain Score 0-No pain                 [facility] Adult PT Treatment/Exercise - 01/21/20 1540      Transfers   Transfers Sit to Stand;Stand to Sit    Sit to Stand 6: Modified independent (Device/Increase time)    Stand to Sit 6: Modified independent (Device/Increase time)      Ambulation/Gait   Ambulation/Gait  Yes    Ambulation/Gait Assistance 5: Supervision    Ambulation/Gait Assistance Details around gym with session    Assistive device Rolling walker    Gait Pattern Step-through pattern;Decreased stride length;Decreased dorsiflexion - right;Decreased dorsiflexion - left;Right steppage;Left steppage;Wide base of support;Trunk flexed    Ambulation Surface Level;Indoor      Exercises   Exercises Other Exercises    Other Exercises  issued HEP for strengthening. Refer to Medbridge for full details.            Issued the following to HEP today:  Access Code: [email or web address] URL: [email or web address] Date: 01/21/2020 Prepared by: Sallyanne Kuster  Exercises Seated Heel Raise - 1 x daily - 5 x weekly - 1 sets - 10 reps Seated Ankle Plantarflexion with Resistance - 1 x daily - 5 x weekly - 1 sets - 10 reps Seated Knee Extension with Anchored Resistance - 1 x daily - 5 x weekly - 1 sets - 10 reps Seated Knee Flexion with Anchored Resistance - 1 x daily - 5 x weekly - 1 sets - 10 reps Standing March with Counter Support - 1 x daily - 5 x weekly - 1 sets - 10 reps Standing Hip Abduction with Counter Support - 1 x daily - 5 x weekly -  1 sets - 10 reps      PT Education - 01/21/20 1641    Education Details HEP for strenthening    Person(s) Educated Patient;Caregiver(s)   grandmother   Methods Explanation;Demonstration;Verbal cues;Handout;Tactile cues    Comprehension Verbalized understanding;Returned demonstration;Verbal cues required;Tactile cues required;Need further instruction            PT Short Term Goals - 01/15/20 1417      PT SHORT TERM GOAL #1   Title Pt will be IND with initial HEP in order to indicate improved function and balance (Target Date: 02/14/2020)    Baseline dependent    Time 4    Period Weeks    Status New    Target Date 02/14/20      PT SHORT TERM GOAL #2   Title Will assess B AFOs and get [employer] involved as needed to decrease fall risk and improve  efficiency of gait.    Baseline did not have time to assess    Time 4    Period Weeks    Status New      PT SHORT TERM GOAL #3   Title Pt will improve gait speed to >/= 2.16 ft/sec w/ LRAD and B AFO as needed to indicate dec fall risk.    Baseline 1.56 ft/sec with RW, no AFOs    Time 4    Period Weeks    Status New      PT SHORT TERM GOAL #4   Title Pt will improve 5TSS to </=18 secs with BUE support in order to indicate improved strength.    Baseline 21.75 secs with BUE support    Time 4    Period Weeks    Status New      PT SHORT TERM GOAL #5   Title Will assess TUG and update STG/LTG as able    Baseline did not have time to assess    Time 4    Period Weeks    Status New      Additional Short Term Goals   Additional Short Term Goals Yes      PT SHORT TERM GOAL #6   Title Pt will be able to stand statically without support x 15 secs in order to indicate improved balance and strength.    Baseline 2-3 secs only without LOB posteriorly    Time 4    Period Weeks    Status New             PT Long Term Goals - 01/15/20 1424      PT LONG TERM GOAL #1   Title Pt will be IND with final HEP in order to indicate improved functional mobility and dec fall risk.  (Target Date: 03/15/20)    Baseline dependent    Time 8    Period Weeks    Status New    Target Date 03/15/20      PT LONG TERM GOAL #2   Title Pt will improve gait speed to >/=2.62 ft/sec with LRAD and B AFOs as needed in order to indicate safe community ambulation.    Baseline 1.56 ft/sec withRW and no AFOs    Time 8    Period Weeks    Status New      PT LONG TERM GOAL #3   Title Pt will improve 5TSS to </=16 secs with single UE support only in order to indicate improved functional strength.    Baseline 21.75 secs with BUE support  Time 8    Period Weeks    Status New      PT LONG TERM GOAL #4   Title Pt will ambulate x 500' over unlevel paved surfaces w/ LRAD and B AFOs as needed at mod I level in  order to indicate safe community mobility.    Baseline unable to assess, but is fatigued with 100' indoor gait with RW    Time 8    Period Weeks    Status New      PT LONG TERM GOAL #5   Title Pt will perform dynamic standing balance with intermittent UE support x 5 mins in order to perform ADLs safely at home.    Baseline Needs BUE support as this time, unable to stand statically without support    Time 8    Period Weeks    Status New                 Plan - 01/21/20 1540    Clinical Impression Statement Today's skilled session focused on establishment of an HEP for LE strengthening. No issues noted or reported with performance in session today. The pt is progressing and should benefit from continued PT to progress toward unmet goals.    Personal Factors and Comorbidities Comorbidity 3+;Time since onset of injury/illness/exacerbation    Comorbidities see above    Examination-Activity Limitations Bathing;Dressing;Locomotion Level;Transfers;Squat;Stairs;Stand    Examination-Participation Restrictions Community Activity;Driving;School;Yard Work   Associate Professor Evolving/Moderate complexity    Rehab Potential Good    PT Frequency 2x / week    PT Duration 8 weeks    PT Treatment/Interventions ADLs/Self Care Home Management;DME Instruction;Gait training;Stair training;Functional mobility training;Therapeutic activities;Therapeutic exercise;Balance training;Neuromuscular re-education;Patient/family education;Orthotic Fit/Training;Passive range of motion;Vestibular    PT Next Visit Plan TUG wih RW; Scifit for strengthening/activity tolerance, work on static balance at add to HEP as indicated/safe to do so    Consulted and Agree with Plan of Care Patient;Family member/caregiver    Family Member Consulted grandmother           Patient will benefit from skilled therapeutic intervention in order to improve the following deficits and impairments:  Abnormal  gait, Decreased activity tolerance, Decreased balance, Decreased endurance, Decreased knowledge of precautions, Decreased knowledge of use of DME, Decreased mobility, Decreased safety awareness, Decreased strength, Difficulty walking, Impaired perceived functional ability, Impaired sensation, Impaired UE functional use, Postural dysfunction  Visit Diagnosis: Unsteadiness on feet  Foot drop, left  Foot drop, right  Other abnormalities of gait and mobility  Muscle weakness (generalized)     Problem List Patient Active Problem List   Diagnosis Date Noted  . 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 DKA, type 1 (HCC) 10/23/2019  . DKA (diabetic ketoacidosis) (HCC) 10/21/2019  . COVID-19 virus infection 10/21/2019  . Diabetic polyneuropathy associated with type 1 diabetes mellitus (HCC) 02/26/2019  . Leg weakness, bilateral 02/25/2019  . Postprandial bloating 02/25/2019  . Sensory problems with limbs 02/25/2019  . Ketonuria 11/04/2018  . Diabetes (HCC) 04/23/2018  . Uncontrolled type 1 diabetes mellitus with hyperglycemia (HCC) 05/24/2017  . Morbid childhood obesity with BMI greater than 99th percentile for age [facility]) 05/24/2017  . Insulin resistance 05/24/2017  . Acanthosis 05/24/2017    Sallyanne Kuster, PTA, [facility] [address] [place], [place] [address] [phone] 01/21/20, 5:11 PM   Name: Octavis Sheeler MRN: [id number] Date of Birth: Jun 15, 2000

## 2020-01-22 ENCOUNTER — Ambulatory Visit (INDEPENDENT_AMBULATORY_CARE_PROVIDER_SITE_OTHER): Payer: Medicaid Other | Admitting: Pharmacist

## 2020-01-22 NOTE — Progress Notes (Signed)
DIABETES SURVIVAL SKILLS PROGRAM  AGENDA    Endocrinology provider: Dr. Fransico Michael (upcoming appt 03/14/19 9:00 am)  Dietitian: [name], RD (no upcoming appt) -Previous appts on 06/10/18, 02/25/19, and 12/22/2019  Behavioral health specialist: Dr. Huntley Dec (no upcoming appt) -No prior appt  Patient referred by Dr. Fransico Michael for diabetes education. PMH is significant for T1DM with severe insulin resistance, acanthosis, diabetic polyneuropathy, morbid obesity, prior hx of COVID-19 infection 11-05-19), and depression. Patient was lost to follow up from 02/25/19 to 12/03/19. At prior appt with Dr. Fransico Michael on 12/03/19, multiple issues were discussed. Patient states Spring/Summer 2021 he lost focus on his DM management due to how his mother passed away (now living with his grandmother). He has been guessing at his Novolog doses (not following the sliding scale plan provided by Dr Vanessa Hanover on 11/04/18). He has SEVERE neuropathy; he can't stand up from a sitting position without using his arms for support and he also experiences numbness and loss of strength in the palm of his right hand. He was previously followed by Dr. Devonne Doughty, but has "aged out" of that practice. He was a no show for two NP appointments at [facility], once in 10/05/22 when his mother died and once in November 05, 2022 when hew was admitted with DKA. Dr. Fransico Michael asked the grandmother to call [facility] and obtain a new appointment for him. Patient did mention though at prior appt with Dr Fransico Michael that he wants help in managing his DM. Dr. Fransico Michael advised patient to continue Tresiba 100 units daily (200 units/mL pen) and to re-start Novolog sliding scale + fixed dose of Novolog 10 units three times daily prior to meals. Dr. Fransico Michael would like to transition patient to a 2 component plan (carb + correction factor) for rapid acting insulin in the future.  At prior appt on 01/16/2020, Dexcom report showed TIR 0%, 0% low/very low. [name] was  increased from 90 units daily to 100 units daily. There was confusion regarding Afrezza administration - reviewed and clarified how to calculate Afrezza dose (correction dose + food dose)).   Patient presents with grandma Richarda Overlie) for follow up diabetes education appt. Patient states having issues with [name] (thought he was using expired pen) and Afrezza (may be keeping out of fridge too long). He states his grandmother is not helping him - not writing down carbs at meals / insulin doses/ dates on insulin to know how long he can use it. He feels strongly he would like remain independent and does not want help from home health.  School: not in school right now; planning to re-enrol in [employer] (he is unsure of time of re-enroll)  Insurance Coverage: Managed Medicaid [facility] plan; ID # [id number])  Diabetes Diagnosis: 04/23/18  Family History: T2DM (maternal grandmother, paternal grandfather); T1DM (paternal grandmother); DM (uncle)  Patient-Reported BG Readings:  -Patient denies hypoglycemic events. --Treats hypoglycemic episode with candy (2 starbusts) --Hypoglycemic symptoms: "cold, calm, peaceful"  Preferred Pharmacy [facility] [phone] - [place], [place] - [address] AT [facility] [address]  [address] [place] [address]  Phone:  [phone] Fax:  [phone]  DEA #:  [id number]  Medication Adherence -Patient denies adherence with [name] (forgot 2x) and Afrezza/Novolog (forgets 2x daily twice weekly) -Current diabetes medications include: Tresiba 100 units daily (200 units/mL pen), Afrezza/Novolog (Target BG 200, ISF 13, ICR 4) -Prior diabetes medications include: none  Injection Sites (no changes since prior appt on 01/16/20) -Patient-reports injection sites are  right arm, abdomen, right leg --Patient reports independently injecting DM medications. --Patient reports rotating injection sites  Diet (changes since prior appt on  01/16/20) Patient reported dietary habits (gets $90 in food stamps each month) Eats 2 meals/day and 1 snacks/day Breakfast (8:30-9:00 am): 3 eggs with 2 pieces of whote wheat nature's own brand toast sometimes [place] breast or bologna --Carbs: 11 g per slice of toast, 22 g total Lunch (11am -12 pm): chicken/tuna with [place] chicken flavor ramen (2 bags at a time) and mayonnaise  --Carbs: 52 g per bag, 104 g total  Dinner (10-11pm): will skip dinner occasionally, noodles with chicken/tuna and mayonnaise (skipped 3x since last visit) Snacks: crackers Drinks: coffee with powder creamer, 2% milk (1-2 cups), water (24 bottle case in 2-3 days), no more shake em up powder (sugar free, starburst)   Exercise (changes since prior appt on 01/06/20) Patient-reported exercise habits: walks around house (10 min at a time, 2x daily), sometimes walk around porch; leg kicks in bed everyday for 5 min,  Doing exercises recommended by PT (using band, marching) does for 5 minutes each day   Monitoring: Patient 2 episodes of nocturia (nighttime urination).  Patient denies neuropathy (nerve pain). Patient denies visual changes. (Followed by ophthalmology) Patient reports self foot exams.  -Patient reports socks/slippers in the house and shoes outside.  -Patient reports monitoring for open wounds/cuts on her feet; no open cuts/wounds.    There were no vitals filed for this visit.  Lab Results  Component Value Date   HGBA1C 14.4 (H) 10/21/2019   HGBA1C 14.0 (A) 02/25/2019   HGBA1C >14 02/25/2019    Lab Results  Component Value Date   CPEPTIDE 3.8 04/23/2018       Assessment:  Patient's goal with DM management: "Get diabetes controlled as independently as possible"  TIR 0%, 0% low/very low. Patient's 99% very high and <1% high. Days with Dexcom use has increased from 18% -->70% (encouraged patient for his success). Patient's BG so elevated likely due to frequent nonadherence. Based on his  Dexcom Clarity report he likely is administering rapid acting insulin 0-1x daily.To keep plan as simple as possible will advise patient to focus on administering rapid acting insulin at least 4x daily. Discussed importance of attempting to record this information. If grandma is unable to record this information then we need to brainstorm options of how to record this - may be considering home health. Patient remains not wanting home health. He is agreeable to recording this information in his phone in the notes tab. Went through example on how to record information. Patient verbalized understanding. Will maintain close folllow up with patient to help hold him accountable - scheduled follow up visit 01/30/20 10:30 am.  Plan: 1. Medications:  a. Continue Tresiba 100 units daily b. Continue Afrezza (Target BG 200, ISF 13, ICR 4); administer 4x daily 2. Monitoring a. Continue Dexcom G6 CGM b. Virginio Isidore has a diagnosis of diabetes, checks blood glucose readings > 4x per day, treats with > 3 insulin injections, and requires frequent adjustments to insulin regimen. This patient will be seen every six months, minimally, to assess adherence to their CGM regimen and diabetes treatment plan 3. Follow Up: 01/30/20 10:30 am  This appointment required 60 minutes of patient care (this includes precharting, chart review, review of results, face-to-face care, etc.).  Thank you for involving clinical pharmacist/diabetes educator to assist in providing this patient's care.  Zachery Conch, PharmD, CPP, CDCES

## 2020-01-23 ENCOUNTER — Encounter: Payer: Self-pay | Admitting: Physical Therapy

## 2020-01-23 ENCOUNTER — Telehealth (INDEPENDENT_AMBULATORY_CARE_PROVIDER_SITE_OTHER): Payer: Self-pay | Admitting: Pharmacist

## 2020-01-23 ENCOUNTER — Ambulatory Visit (INDEPENDENT_AMBULATORY_CARE_PROVIDER_SITE_OTHER): Payer: Self-pay | Admitting: Pharmacist

## 2020-01-23 ENCOUNTER — Ambulatory Visit: Payer: Medicaid Other | Admitting: Physical Therapy

## 2020-01-23 ENCOUNTER — Other Ambulatory Visit: Payer: Self-pay

## 2020-01-23 DIAGNOSIS — M21371 Foot drop, right foot: Secondary | ICD-10-CM

## 2020-01-23 DIAGNOSIS — M6281 Muscle weakness (generalized): Secondary | ICD-10-CM | POA: Diagnosis not present

## 2020-01-23 DIAGNOSIS — E1065 Type 1 diabetes mellitus with hyperglycemia: Secondary | ICD-10-CM

## 2020-01-23 DIAGNOSIS — R2681 Unsteadiness on feet: Secondary | ICD-10-CM

## 2020-01-23 DIAGNOSIS — R2689 Other abnormalities of gait and mobility: Secondary | ICD-10-CM

## 2020-01-23 DIAGNOSIS — M21372 Foot drop, left foot: Secondary | ICD-10-CM

## 2020-01-23 DIAGNOSIS — R208 Other disturbances of skin sensation: Secondary | ICD-10-CM

## 2020-01-23 MED ORDER — HUMALOG KWIKPEN 200 UNIT/ML ~~LOC~~ SOPN: PEN_INJECTOR | SUBCUTANEOUS | 11 refills | Status: DC

## 2020-01-23 NOTE — Patient Instructions (Signed)
Access Code: [email or web address] URL: [email or web address] Date: 01/23/2020 Prepared by: Sallyanne Kuster  Exercises Seated Heel Raise - 1 x daily - 5 x weekly - 1 sets - 10 reps Seated Ankle Plantarflexion with Resistance - 1 x daily - 5 x weekly - 1 sets - 10 reps Seated Knee Extension with Anchored Resistance - 1 x daily - 5 x weekly - 1 sets - 10 reps Seated Knee Flexion with Anchored Resistance - 1 x daily - 5 x weekly - 1 sets - 10 reps Standing March with Counter Support - 1 x daily - 5 x weekly - 1 sets - 10 reps Standing Hip Abduction with Counter Support - 1 x daily - 5 x weekly - 1 sets - 10 reps  Added these to HEP today: Wide Stance with Head Nods and Counter Support - 1 x daily - 5 x weekly - 1 sets - 10 reps Wide Stance with Head Rotations and Counter Support - 1 x daily - 5 x weekly - 1 sets - 10 reps Wide Stance with Counter Support - 1 x daily - 5 x weekly - 1 sets - 10 reps

## 2020-01-23 NOTE — Telephone Encounter (Signed)
[employer] (Key: [id number]) - [id number] HumaLOG KwikPen 200UNIT/ML pen-injectors Status: PA Response - Approved Created: December 10th, 2021 Sent: December 10th, 2021  Sent in prescription to patient's preferred pharmacy.  Thank you for involving clinical pharmacist/diabetes educator to assist in providing this patient's care.   Zachery Conch, PharmD, CPP, CDCES

## 2020-01-23 NOTE — Addendum Note (Signed)
Addended by: Buena Irish on: 01/23/2020 01:14 PM   Modules accepted: Orders

## 2020-01-23 NOTE — Telephone Encounter (Signed)
Lucas Cunningham (Key: [id number]) - [id number] HumaLOG KwikPen 200UNIT/ML pen-injectors Status: PA Request Created: December 10th, 2021 Sent: December 10th, 2021

## 2020-01-23 NOTE — Therapy (Addendum)
[facility] [address] [place], [place], [address] Phone: [phone]   Fax:  [phone]  Physical Therapy Treatment  Patient Details  Name: Lucas Cunningham MRN: [id number] Date of Birth: 2000/10/11 Referring Provider (PT): Lance Morin Torric NP   Encounter Date: 01/23/2020   PT End of Session - 01/23/20 1236    Visit Number 3    Number of Visits 17    Authorization Type [employer]    Authorization Time Period 16 visits 12/8-2/02/22    Authorization - Visit Number 2    Authorization - Number of Visits 16    PT Start Time 1232    PT Stop Time 1315    PT Time Calculation (min) 43 min    Activity Tolerance Patient tolerated treatment well;Patient limited by fatigue    Behavior During Therapy [facility] for tasks assessed/performed           Past Medical History:  Diagnosis Date  . Diabetes mellitus without complication (HCC)    type 1    History reviewed. No pertinent surgical history.  There were no vitals filed for this visit.   Subjective Assessment - 01/23/20 1233    Subjective No new complaitns. No falls or pain. Has been doing the ex's with no issues. Grandmother reports concerns for his toe nails growing into his skin, they do not have any nail clippers at home.    Patient is accompained by: Family member    Limitations Walking;Standing;House hold activities    How long can you stand comfortably? 10 mins    How long can you walk comfortably? 5 mins    Patient Stated Goals "I want to be able to bring in groceries and help around the house."    Currently in Pain? No/denies    Pain Score 0-No pain              [facility] PT Assessment - 01/23/20 1240      Standardized Balance Assessment   Standardized Balance Assessment Timed Up and Go Test      Timed Up and Go Test   TUG Normal TUG    Normal TUG (seconds) 20.97   with RW              [facility] Adult PT Treatment/Exercise -  01/23/20 1240      Transfers   Transfers Sit to Stand;Stand to Sit    Sit to Stand 6: Modified independent (Device/Increase time)    Stand to Sit 6: Modified independent (Device/Increase time)      Ambulation/Gait   Ambulation/Gait Yes    Ambulation/Gait Assistance 5: Supervision    Ambulation/Gait Assistance Details around gym with session    Assistive device Rolling walker    Gait Pattern Step-through pattern;Decreased stride length;Decreased dorsiflexion - right;Decreased dorsiflexion - left;Right steppage;Left steppage;Wide base of support;Trunk flexed    Ambulation Surface Level;Indoor      Neuro Re-ed    Neuro Re-ed Details  added balance ex's to HEP today. Refer to [employer] for full details. Min guard assist for balance with cues on form/technique.                    PT Short Term Goals - 01/15/20 1417      PT SHORT TERM GOAL #1   Title Pt will be IND with initial HEP in order to indicate improved function and balance (Target Date: 02/14/2020)    Baseline dependent    Time 4  Period Weeks    Status New    Target Date 02/14/20      PT SHORT TERM GOAL #2   Title Will assess B AFOs and get [employer] involved as needed to decrease fall risk and improve efficiency of gait.    Baseline did not have time to assess    Time 4    Period Weeks    Status New      PT SHORT TERM GOAL #3   Title Pt will improve gait speed to >/= 2.16 ft/sec w/ LRAD and B AFO as needed to indicate dec fall risk.    Baseline 1.56 ft/sec with RW, no AFOs    Time 4    Period Weeks    Status New      PT SHORT TERM GOAL #4   Title Pt will improve 5TSS to </=18 secs with BUE support in order to indicate improved strength.    Baseline 21.75 secs with BUE support    Time 4    Period Weeks    Status New      PT SHORT TERM GOAL #5   Title Will assess TUG and update STG/LTG as able    Baseline did not have time to assess    Time 4    Period Weeks    Status New      Additional Short Term  Goals   Additional Short Term Goals Yes      PT SHORT TERM GOAL #6   Title Pt will be able to stand statically without support x 15 secs in order to indicate improved balance and strength.    Baseline 2-3 secs only without LOB posteriorly    Time 4    Period Weeks    Status New             PT Long Term Goals - 01/15/20 1424      PT LONG TERM GOAL #1   Title Pt will be IND with final HEP in order to indicate improved functional mobility and dec fall risk.  (Target Date: 03/15/20)    Baseline dependent    Time 8    Period Weeks    Status New    Target Date 03/15/20      PT LONG TERM GOAL #2   Title Pt will improve gait speed to >/=2.62 ft/sec with LRAD and B AFOs as needed in order to indicate safe community ambulation.    Baseline 1.56 ft/sec withRW and no AFOs    Time 8    Period Weeks    Status New      PT LONG TERM GOAL #3   Title Pt will improve 5TSS to </=16 secs with single UE support only in order to indicate improved functional strength.    Baseline 21.75 secs with BUE support    Time 8    Period Weeks    Status New      PT LONG TERM GOAL #4   Title Pt will ambulate x 500' over unlevel paved surfaces w/ LRAD and B AFOs as needed at mod I level in order to indicate safe community mobility.    Baseline unable to assess, but is fatigued with 100' indoor gait with RW    Time 8    Period Weeks    Status New      PT LONG TERM GOAL #5   Title Pt will perform dynamic standing balance with intermittent UE support x 5 mins in order to perform  ADLs safely at home.    Baseline Needs BUE support as this time, unable to stand statically without support    Time 8    Period Weeks    Status New              01/23/20 1236  Plan  Clinical Impression Statement Today's skilled session focused on setting baseline value for Timed up and Go with primary PT to set goals. Remainder of session focused on additions of balance ex's to HEP and use of Scifit for strengthening and  activity tolerance. Pt and grandmother also reporting a need for pt to have his toe nails cut as they are starting to grow into his skin. Discussed risk of infection/other issues if his skin gets cut/nicked with this task and to discuss with his diabetes doctor at that next appointment. The pt is progressing toward goals and should benefit from continued PT to progress toward unmet goals.  Personal Factors and Comorbidities Comorbidity 3+;Time since onset of injury/illness/exacerbation  Comorbidities see above  Examination-Activity Limitations Bathing;Dressing;Locomotion Level;Transfers;Squat;Stairs;Stand  Examination-Participation Restrictions Community Activity;Driving;School;Yard Work (school)  Pt will benefit from skilled therapeutic intervention in order to improve on the following deficits Abnormal gait;Decreased activity tolerance;Decreased balance;Decreased endurance;Decreased knowledge of precautions;Decreased knowledge of use of DME;Decreased mobility;Decreased safety awareness;Decreased strength;Difficulty walking;Impaired perceived functional ability;Impaired sensation;Impaired UE functional use;Postural dysfunction  Stability/Clinical Decision Making Evolving/Moderate complexity  Rehab Potential Good  PT Frequency 2x / week  PT Duration 8 weeks  PT Treatment/Interventions ADLs/Self Care Home Management;DME Instruction;Gait training;Stair training;Functional mobility training;Therapeutic activities;Therapeutic exercise;Balance training;Neuromuscular re-education;Patient/family education;Orthotic Fit/Training;Passive range of motion;Vestibular  PT Next Visit Plan Scifit for strengthening/activity tolerance, continue to work on strengthening and balance with UE support.  Consulted and Agree with Plan of Care Patient;Family member/caregiver  Family Member Consulted grandmother         Patient will benefit from skilled therapeutic intervention in order to improve the following deficits  and impairments:  Abnormal gait,Decreased activity tolerance,Decreased balance,Decreased endurance,Decreased knowledge of precautions,Decreased knowledge of use of DME,Decreased mobility,Decreased safety awareness,Decreased strength,Difficulty walking,Impaired perceived functional ability,Impaired sensation,Impaired UE functional use,Postural dysfunction  Visit Diagnosis: Unsteadiness on feet  Muscle weakness (generalized)  Other disturbances of skin sensation  Foot drop, left  Foot drop, right  Other abnormalities of gait and mobility     Problem List Patient Active Problem List   Diagnosis Date Noted  . 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 DKA, type 1 (HCC) 10/23/2019  . DKA (diabetic ketoacidosis) (HCC) 10/21/2019  . COVID-19 virus infection 10/21/2019  . Diabetic polyneuropathy associated with type 1 diabetes mellitus (HCC) 02/26/2019  . Leg weakness, bilateral 02/25/2019  . Postprandial bloating 02/25/2019  . Sensory problems with limbs 02/25/2019  . Ketonuria 11/04/2018  . Diabetes (HCC) 04/23/2018  . Uncontrolled type 1 diabetes mellitus with hyperglycemia (HCC) 05/24/2017  . Morbid childhood obesity with BMI greater than 99th percentile for age [facility]) 05/24/2017  . Insulin resistance 05/24/2017  . Acanthosis 05/24/2017    Sallyanne Kuster, PTA, [facility] Outpatient Neuro [facility] [address], [address] [place], [place] [address] [phone] 01/23/20, 5:07 PM   Name: Lucas Cunningham MRN: [id number] Date of Birth: 07/05/2000

## 2020-01-26 ENCOUNTER — Telehealth: Payer: Self-pay | Admitting: Rehabilitation

## 2020-01-26 ENCOUNTER — Ambulatory Visit (INDEPENDENT_AMBULATORY_CARE_PROVIDER_SITE_OTHER): Payer: Medicaid Other | Admitting: Pharmacist

## 2020-01-26 ENCOUNTER — Other Ambulatory Visit: Payer: Self-pay

## 2020-01-26 VITALS — BP 118/78 | Wt 275.4 lb

## 2020-01-26 DIAGNOSIS — E1065 Type 1 diabetes mellitus with hyperglycemia: Secondary | ICD-10-CM

## 2020-01-26 LAB — POCT GLYCOSYLATED HEMOGLOBIN (HGB A1C): HbA1c POC (<> result, manual entry): >14 % (ref 4.0–5.6)

## 2020-01-26 LAB — POCT GLUCOSE (DEVICE FOR HOME USE): Glucose Fasting, POC: 243 mg/dL — AB (ref 70–99)

## 2020-01-26 MED ORDER — AFREZZA 4 & 8 & 12 UNITS IN POWD: RESPIRATORY_TRACT | 11 refills | Status: DC

## 2020-01-26 NOTE — Telephone Encounter (Signed)
Lucas Cunningham,   I am seeing Lucas Cunningham here at OP neuro for PT.  He will likely need B AFOs for B foot drop to decrease fall risk and improve efficiency of gait.  Would you please write an order for B AFOs in Epic work que and we can get that set up for him?  Thanks,  Harriet Butte, PT, MPT [facility] [address] [place], [place], [address] Phone: [phone]   Fax:  [phone] 01/26/20, 8:52 AM

## 2020-01-26 NOTE — Patient Instructions (Addendum)
It was a pleasure seeing you today!  Today the plan is.. 1. We are going to focus on taking Afrezza/Novolog/Humalog (rapid acting insulin) four times daily 2. Make sure to record in your phone the date, meal, amount of carbs, blood sugar reading, and insulin units 3. Make sure to also record when your insulin will expire so you have that to refer to. Afrezza (in the fridge lasts 30 days, room temperate blister pack 10 days, room temperature set of 3 Afrezza cartridges 3 days), Novolog/humalog 30 days, [name] 2 months. a. I will send Afrezza prescription to [facility]  4. I will see you on Friday 01/30/20 10:30 am  5. For breakfast you typically eat 22 grams of carbs (3 units of insulin) if you eat two pieces of toast. For lunch/dinner you eat 2 packs of ramen with tuna/chicken then that is 104 grams of carbs (19 units of insulin). Remember to also calculate insulin dose for blood sugar  Please contact me (Dr. Ladona Ridgel) at [phone] or via Mychart with any questions/concerns

## 2020-01-27 ENCOUNTER — Ambulatory Visit: Payer: Medicaid Other | Admitting: Physical Therapy

## 2020-01-27 ENCOUNTER — Encounter: Payer: Self-pay | Admitting: Occupational Therapy

## 2020-01-27 ENCOUNTER — Telehealth: Payer: Self-pay | Admitting: Physical Therapy

## 2020-01-27 ENCOUNTER — Ambulatory Visit: Payer: Medicaid Other | Admitting: Occupational Therapy

## 2020-01-27 DIAGNOSIS — M6281 Muscle weakness (generalized): Secondary | ICD-10-CM | POA: Diagnosis not present

## 2020-01-27 DIAGNOSIS — R278 Other lack of coordination: Secondary | ICD-10-CM

## 2020-01-27 DIAGNOSIS — R208 Other disturbances of skin sensation: Secondary | ICD-10-CM

## 2020-01-27 DIAGNOSIS — E1065 Type 1 diabetes mellitus with hyperglycemia: Secondary | ICD-10-CM

## 2020-01-27 DIAGNOSIS — R29818 Other symptoms and signs involving the nervous system: Secondary | ICD-10-CM

## 2020-01-27 DIAGNOSIS — R2681 Unsteadiness on feet: Secondary | ICD-10-CM

## 2020-01-27 DIAGNOSIS — M25531 Pain in right wrist: Secondary | ICD-10-CM

## 2020-01-27 NOTE — Telephone Encounter (Signed)
Lucas Cunningham,  Thank you for letting me know.  Patient did not inform me about this. I will discuss a referral to a podiatrist when I see patient on Friday 01/30/20.  Thanks! [name]

## 2020-01-27 NOTE — Patient Instructions (Signed)
°  PUTTY EXERCISES  Flatten putty with hand on table, ball back up and flatten again. Do this 10 times. 2-3 times a day. Repeat with other hand.   Roll putty into a tube. Take thumb and press down as hard as you can into the tube all the way down. Do this 10 times. 2-3 times a day. Repeat with other hand.  Pull putty with both hands starting together and pulling apart in opposite directions. Do this 10 times. 2-3 times a day  Twist putty with both hands starting together and twisting opposite directions. Do this 10 times. 2-3 times a day.  Roll putty on table with both hands.

## 2020-01-27 NOTE — Therapy (Signed)
[facility] [address] [place], [place], [address] Phone: [phone]   Fax:  [phone]  Occupational Therapy Treatment  Patient Details  Name: Lucas Cunningham MRN: [id number] Date of Birth: [birth year] Referring Provider (OT): Frederico Hamman, NP   Encounter Date: 01/27/2020   OT End of Session - 01/27/20 1618    Visit Number 3    Number of Visits 17    Date for OT Re-Evaluation 03/11/20    Authorization Type [employer]    Authorization Time Period 27 OT/PT/ST    OT Start Time 1617    OT Stop Time 1700    OT Time Calculation (min) 43 min    Activity Tolerance Patient tolerated treatment well    Behavior During Therapy [facility] for tasks assessed/performed           Past Medical History:  Diagnosis Date  . Diabetes mellitus without complication (HCC)    type 1    History reviewed. No pertinent surgical history.  There were no vitals filed for this visit.   Subjective Assessment - 01/27/20 1619    Subjective  "My finger is not wanting to move" (L)    Patient is accompanied by: Family member   grandmother   Pertinent History PMH Type I diabetes, depression, peripheral neuropathy    Limitations Fall Risk. Sensation deficits in RUE    Patient Stated Goals Pt wants "to be able to grab things and write and use my fingers"    Currently in Pain? No/denies                        OT Treatments/Exercises (OP) - 01/27/20 1629      Exercises   Exercises Theraputty      Theraputty   Theraputty - Flatten yellow theraputty    Theraputty - Roll -- pulling bilaterally with increase in bimanual coordination and strengthening. flattening bilaterally for weight bearing and strengthening      Electrical Stimulation   Electrical Stimulation Location BUE wrist extension and flexion    Electrical Stimulation Action 10 minutes ext and 5 min flexion    Electrical Stimulation Parameters 50 pps 2 sec ramp,  NMES x 10 minutes (10x10) level 15    Electrical Stimulation Goals Neuromuscular facilitation;Strength;Tone                  OT Education - 01/27/20 1648    Education Details yellow theraputty - see pt instructions    Person(s) Educated Patient    Methods Explanation;Demonstration;Handout    Comprehension Verbalized understanding;Returned demonstration            OT Short Term Goals - 01/15/20 1500      OT SHORT TERM GOAL #1   Title Pt will be independent with initial HEP targeting grip strength and range of motion    Baseline not issued yet    Time 4    Period Weeks    Status New    Target Date 02/12/20      OT SHORT TERM GOAL #2   Title Pt will verbalize understanding of sensory strategies for RUE and safety    Baseline have not reviewed    Time 4    Period Weeks    Status New      OT SHORT TERM GOAL #3   Title Pt will increase grip strength in LUE by 5 lbs and RUE by 2 lbs for increase in functional use of BUE for  ADLs and IADLs    Baseline RUE 5 LUE 29.5    Time 4    Period Weeks    Status New      OT SHORT TERM GOAL #4   Title Pt will perform donning and doffing socks and shoes with supervision for increase in independence with ADLs    Baseline unable to don socks consistently    Time 4    Period Weeks    Status New      OT SHORT TERM GOAL #5   Title Pt will perform simple cold meal prep (sandwich) and/or light housekeeping with supervision    Baseline not completing at this time    Time 4    Period Weeks    Status New      OT SHORT TERM GOAL #6   Title --    Baseline --    Time --    Period --    Status --             OT Long Term Goals - 01/15/20 1505      OT LONG TERM GOAL #1   Title Pt will be independent with updated HEP 03/11/2020    Baseline not issued yet    Time 8    Period Weeks    Status New    Target Date 03/11/20      OT LONG TERM GOAL #2   Title Pt will perform simple warm meal prep and/or light house keeping task  with mod I and good safety awareness    Baseline not completing at eval    Time 8    Period Weeks    Status New      OT LONG TERM GOAL #3   Title Pt will improve grip strengthening in LUE by 10 lbs and RUE by 6 lbs for increased ability to perform clothing management for ADLs    Baseline LUE 29.5 RUE 5    Time 8    Period Weeks    Status New      OT LONG TERM GOAL #4   Title Pt will increased box and blocks score in RUE to 12 blocks or more for increase in functional use of RUE    Baseline RUE 6    Time 8    Period Weeks    Status New      OT LONG TERM GOAL #5   Title Pt will report decrease pain in RUE wrist with functional use to no more than 6/10    Baseline 9/10    Time 8    Period Weeks    Status New      OT LONG TERM GOAL #6   Title Pt will complete basic ADLs mod Independent with adapted equipment and strategies PRN    Baseline difficulty with donning socks, cutting up food and fasteners    Time 8    Period Weeks    Status New                 Plan - 01/27/20 1634    Clinical Impression Statement Pt continues to progress towards goals.    OT Occupational Profile and History Problem Focused Assessment - Including review of records relating to presenting problem    Occupational performance deficits (Please refer to evaluation for details): IADL's;ADL's;Leisure;Social Participation    Body Structure / Function / Physical Skills ADL;Balance;Coordination;Decreased knowledge of use of DME;Edema;Flexibility;FMC;Dexterity;Strength;GMC;Pain;Tone;UE functional use;ROM;IADL;Sensation    Psychosocial Skills Coping Strategies  Rehab Potential Good    Clinical Decision Making Limited treatment options, no task modification necessary    Comorbidities Affecting Occupational Performance: None    Modification or Assistance to Complete Evaluation  No modification of tasks or assist necessary to complete eval    OT Frequency 2x / week    OT Duration 8 weeks    OT  Treatment/Interventions Self-care/ADL training;Moist Heat;DME and/or AE instruction;Splinting;Balance training;Therapeutic activities;Aquatic Therapy;Therapeutic exercise;Ultrasound;Electrical Stimulation;Cryotherapy;Neuromuscular education;Paraffin;Energy conservation;Manual Therapy;Patient/family education;Passive range of motion;Functional Mobility Training    Plan review theraputty grip strength. Sensory strategies for RUE.    Recommended Other Services Pt is receiving PT    Consulted and Agree with Plan of Care Patient;Family member/caregiver    Family Member Consulted grandmother           Patient will benefit from skilled therapeutic intervention in order to improve the following deficits and impairments:   Body Structure / Function / Physical Skills: ADL,Balance,Coordination,Decreased knowledge of use of DME,Edema,Flexibility,FMC,Dexterity,Strength,GMC,Pain,Tone,UE functional use,ROM,IADL,Sensation   Psychosocial Skills: Coping Strategies   Visit Diagnosis: Unsteadiness on feet  Muscle weakness (generalized)  Pain in right wrist  Other symptoms and signs involving the nervous system  Other disturbances of skin sensation  Other lack of coordination    Problem List Patient Active Problem List   Diagnosis Date Noted  . 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 DKA, type 1 (HCC) 10/23/2019  . DKA (diabetic ketoacidosis) (HCC) 10/21/2019  . COVID-19 virus infection 10/21/2019  . Diabetic polyneuropathy associated with type 1 diabetes mellitus (HCC) 02/26/2019  . Leg weakness, bilateral 02/25/2019  . Postprandial bloating 02/25/2019  . Sensory problems with limbs 02/25/2019  . Ketonuria 11/04/2018  . Diabetes (HCC) 04/23/2018  . Uncontrolled type 1 diabetes mellitus with hyperglycemia (HCC) 05/24/2017  . Morbid childhood obesity with BMI greater than 99th percentile for age [facility]) 05/24/2017  . Insulin resistance 05/24/2017  .  Acanthosis 05/24/2017    Junious Dresser MOT, OTR/L  01/27/2020, 5:13 PM  [facility] [address] [place], [place], [address] Phone: [phone]   Fax:  [phone]  Name: Chrstopher Malenfant MRN: [id number] Date of Birth: March 27, 2000

## 2020-01-27 NOTE — Telephone Encounter (Signed)
Lucas Cunningham, [facility],  I anted to let you know that on Lucas Cunningham's last PT appointment with me he reported his toe nails were overgrown/growing into his skin. Grandmother was present and confirmed this. They both report no clippers at home.   Due to his diabetic status he was informed to discuss this at his next appointment with his diabetic manager/coordinator to see if he needs to have a clinic address this. I noticed no mention in your visit with him yesterday so wanted to reach out to you.   Thank you for your time,  Sallyanne Kuster, PTA, [facility] Outpatient Neuro [facility] [address] [place], [place] [address] [phone] 01/27/20, 3:00 PM

## 2020-01-28 NOTE — Addendum Note (Signed)
Addended by: Buena Irish on: 01/28/2020 09:56 AM   Modules accepted: Orders

## 2020-01-28 NOTE — Telephone Encounter (Signed)
Placed referral to [facility] in [place]

## 2020-01-29 ENCOUNTER — Other Ambulatory Visit: Payer: Self-pay

## 2020-01-29 ENCOUNTER — Encounter: Payer: Self-pay | Admitting: Occupational Therapy

## 2020-01-29 ENCOUNTER — Ambulatory Visit: Payer: Medicaid Other | Admitting: Occupational Therapy

## 2020-01-29 DIAGNOSIS — M6281 Muscle weakness (generalized): Secondary | ICD-10-CM | POA: Diagnosis not present

## 2020-01-29 DIAGNOSIS — R29818 Other symptoms and signs involving the nervous system: Secondary | ICD-10-CM

## 2020-01-29 DIAGNOSIS — M25531 Pain in right wrist: Secondary | ICD-10-CM

## 2020-01-29 DIAGNOSIS — R208 Other disturbances of skin sensation: Secondary | ICD-10-CM

## 2020-01-29 DIAGNOSIS — R2681 Unsteadiness on feet: Secondary | ICD-10-CM

## 2020-01-29 DIAGNOSIS — R278 Other lack of coordination: Secondary | ICD-10-CM

## 2020-01-29 NOTE — Therapy (Signed)
[facility] [address] [name], [place], [address] Phone: [phone]   Fax:  [phone]  Occupational Therapy Treatment  Patient Details  Name: Lucas Cunningham MRN: [id number] Date of Birth: [birth year] Referring Provider (OT): Frederico Hamman, NP   Encounter Date: 01/29/2020   OT End of Session - 01/29/20 1237    Visit Number 4    Number of Visits 17    Date for OT Re-Evaluation 03/11/20    Authorization Type [employer]    Authorization Time Period 27 OT/PT/ST    OT Start Time 1236    OT Stop Time 1315    OT Time Calculation (min) 39 min    Activity Tolerance Patient tolerated treatment well    Behavior During Therapy [facility] for tasks assessed/performed           Past Medical History:  Diagnosis Date  . Diabetes mellitus without complication (HCC)    type 1    History reviewed. No pertinent surgical history.  There were no vitals filed for this visit.   Subjective Assessment - 01/29/20 1239    Subjective  Pt denies any pain. Pt reports his thumb is now bending in again (R)    Patient is accompanied by: Family member   grandmother   Pertinent History PMH Type I diabetes, depression, peripheral neuropathy    Limitations Fall Risk. Sensation deficits in RUE    Patient Stated Goals Pt wants "to be able to grab things and write and use my fingers"    Currently in Pain? No/denies                        OT Treatments/Exercises (OP) - 01/29/20 1425      Wrist Exercises   Other wrist exercises Pt demonstrated weak to absent (possible trace) with wrist extension and finger extension. Pt's index finger bilaterally is limited and absent for finger flexion at this time.      Neurological Re-education Exercises   Other Grasp and Release Exercises  grasp and release of cones. Pt with active flexion/grasp with exception of index finger. Pt has limited release and extension but compensates with  tenodysis      Modalities   Modalities Electrical Stimulation      Electrical Stimulation   Electrical Stimulation Location BUE wrist extension and flexion. Focus on gripping cone with RUE during flexion and actively releasing with extension    Electrical Stimulation Action 10 minutes ext and 5 min flexion    Electrical Stimulation Parameters 50 pps 2 sec ramp, NMES x 10 minutes (10x10) level 15    Electrical Stimulation Goals Strength;Edema;Neuromuscular facilitation                    OT Short Term Goals - 01/29/20 1442      OT SHORT TERM GOAL #1   Title Pt will be independent with initial HEP targeting grip strength and range of motion    Baseline not issued yet    Time 4    Period Weeks    Status On-going    Target Date 02/12/20      OT SHORT TERM GOAL #2   Title Pt will verbalize understanding of sensory strategies for RUE and safety    Baseline have not reviewed    Time 4    Period Weeks    Status On-going      OT SHORT TERM GOAL #3   Title Pt will increase  grip strength in LUE by 5 lbs and RUE by 2 lbs for increase in functional use of BUE for ADLs and IADLs    Baseline RUE 5 LUE 29.5    Time 4    Period Weeks    Status On-going      OT SHORT TERM GOAL #4   Title Pt will perform donning and doffing socks and shoes with supervision for increase in independence with ADLs    Baseline unable to don socks consistently    Time 4    Period Weeks    Status New      OT SHORT TERM GOAL #5   Title Pt will perform simple cold meal prep (sandwich) and/or light housekeeping with supervision    Baseline not completing at this time    Time 4    Period Weeks    Status New             OT Long Term Goals - 01/15/20 1505      OT LONG TERM GOAL #1   Title Pt will be independent with updated HEP 03/11/2020    Baseline not issued yet    Time 8    Period Weeks    Status New    Target Date 03/11/20      OT LONG TERM GOAL #2   Title Pt will perform simple warm  meal prep and/or light house keeping task with mod I and good safety awareness    Baseline not completing at eval    Time 8    Period Weeks    Status New      OT LONG TERM GOAL #3   Title Pt will improve grip strengthening in LUE by 10 lbs and RUE by 6 lbs for increased ability to perform clothing management for ADLs    Baseline LUE 29.5 RUE 5    Time 8    Period Weeks    Status New      OT LONG TERM GOAL #4   Title Pt will increased box and blocks score in RUE to 12 blocks or more for increase in functional use of RUE    Baseline RUE 6    Time 8    Period Weeks    Status New      OT LONG TERM GOAL #5   Title Pt will report decrease pain in RUE wrist with functional use to no more than 6/10    Baseline 9/10    Time 8    Period Weeks    Status New      OT LONG TERM GOAL #6   Title Pt will complete basic ADLs mod Independent with adapted equipment and strategies PRN    Baseline difficulty with donning socks, cutting up food and fasteners    Time 8    Period Weeks    Status New                 Plan - 01/29/20 1442    Clinical Impression Statement Pt continues to progress towards goals. Pt continues to have limited wrist extension in RUE impeding functional use of RUE    OT Occupational Profile and History Problem Focused Assessment - Including review of records relating to presenting problem    Occupational performance deficits (Please refer to evaluation for details): IADL's;ADL's;Leisure;Social Participation    Body Structure / Function / Physical Skills ADL;Balance;Coordination;Decreased knowledge of use of DME;Edema;Flexibility;FMC;Dexterity;Strength;GMC;Pain;Tone;UE functional use;ROM;IADL;Sensation    Psychosocial Skills Coping Strategies  Rehab Potential Good    Clinical Decision Making Limited treatment options, no task modification necessary    Comorbidities Affecting Occupational Performance: None    Modification or Assistance to Complete Evaluation  No  modification of tasks or assist necessary to complete eval    OT Frequency 2x / week    OT Duration 8 weeks    OT Treatment/Interventions Self-care/ADL training;Moist Heat;DME and/or AE instruction;Splinting;Balance training;Therapeutic activities;Aquatic Therapy;Therapeutic exercise;Ultrasound;Electrical Stimulation;Cryotherapy;Neuromuscular education;Paraffin;Energy conservation;Manual Therapy;Patient/family education;Passive range of motion;Functional Mobility Training    Plan sensory strategies for RUE    Recommended Other Services Pt is receiving PT    Consulted and Agree with Plan of Care Patient;Family member/caregiver    Family Member Consulted grandmother           Patient will benefit from skilled therapeutic intervention in order to improve the following deficits and impairments:   Body Structure / Function / Physical Skills: ADL,Balance,Coordination,Decreased knowledge of use of DME,Edema,Flexibility,FMC,Dexterity,Strength,GMC,Pain,Tone,UE functional use,ROM,IADL,Sensation   Psychosocial Skills: Coping Strategies   Visit Diagnosis: Unsteadiness on feet  Muscle weakness (generalized)  Pain in right wrist  Other symptoms and signs involving the nervous system  Other disturbances of skin sensation  Other lack of coordination    Problem List Patient Active Problem List   Diagnosis Date Noted  . 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 DKA, type 1 (HCC) 10/23/2019  . DKA (diabetic ketoacidosis) (HCC) 10/21/2019  . COVID-19 virus infection 10/21/2019  . Diabetic polyneuropathy associated with type 1 diabetes mellitus (HCC) 02/26/2019  . Leg weakness, bilateral 02/25/2019  . Postprandial bloating 02/25/2019  . Sensory problems with limbs 02/25/2019  . Ketonuria 11/04/2018  . Diabetes (HCC) 04/23/2018  . Uncontrolled type 1 diabetes mellitus with hyperglycemia (HCC) 05/24/2017  . Morbid childhood obesity with BMI greater  than 99th percentile for age [facility]) 05/24/2017  . Insulin resistance 05/24/2017  . Acanthosis 05/24/2017    Junious Dresser MOT, OTR/L  01/29/2020, 2:42 PM  [facility] [address] [place], [place], [address] Phone: [phone]   Fax:  [phone]  Name: Lucas Cunningham MRN: [id number] Date of Birth: 2001-02-02

## 2020-01-29 NOTE — Telephone Encounter (Signed)
Thank you so much!  Sallyanne Kuster, PTA, [facility] [address] [place], [place] [address] [phone] 01/29/20, 12:17 PM

## 2020-01-30 ENCOUNTER — Telehealth (INDEPENDENT_AMBULATORY_CARE_PROVIDER_SITE_OTHER): Payer: Self-pay

## 2020-01-30 ENCOUNTER — Telehealth (INDEPENDENT_AMBULATORY_CARE_PROVIDER_SITE_OTHER): Payer: Medicaid Other | Admitting: Pharmacist

## 2020-01-30 ENCOUNTER — Ambulatory Visit: Payer: Medicaid Other | Admitting: Physical Therapy

## 2020-01-30 ENCOUNTER — Encounter: Payer: Self-pay | Admitting: Physical Therapy

## 2020-01-30 DIAGNOSIS — M6281 Muscle weakness (generalized): Secondary | ICD-10-CM

## 2020-01-30 DIAGNOSIS — E1065 Type 1 diabetes mellitus with hyperglycemia: Secondary | ICD-10-CM

## 2020-01-30 DIAGNOSIS — M21371 Foot drop, right foot: Secondary | ICD-10-CM

## 2020-01-30 DIAGNOSIS — R2689 Other abnormalities of gait and mobility: Secondary | ICD-10-CM

## 2020-01-30 DIAGNOSIS — R2681 Unsteadiness on feet: Secondary | ICD-10-CM

## 2020-01-30 DIAGNOSIS — M21372 Foot drop, left foot: Secondary | ICD-10-CM

## 2020-01-30 MED ORDER — METFORMIN HCL ER 500 MG PO TB24: 500.0000 mg | ORAL_TABLET | Freq: Two times a day (BID) | ORAL | 11 refills | Status: DC

## 2020-01-30 MED ORDER — DEXCOM G6 SENSOR MISC: 1.0000 | 11 refills | Status: DC

## 2020-01-30 MED ORDER — DEXCOM G6 TRANSMITTER MISC: 1.0000 | 3 refills | Status: DC

## 2020-01-30 NOTE — Telephone Encounter (Signed)
Attempted to call [place] to relay Dr. Lubertha Basque message "can you call Flavio and tell him i placed a podiatry referral for him and he will get a call about that soon,"  Left HIPAA approved voicemail for return phone call.

## 2020-01-30 NOTE — Telephone Encounter (Signed)
Called patient to follow up regarding Dexcom email and download.  He stated he got the emails but needs help to download it.  Helped patient with download to clarity website.  He is now sharing his Dexcom to the clinic site.  Explained to him that I will send him a link for a virtual visit shortly and if he is not logged on by 10:30 I will call him to help get logged on for the virtual visit.

## 2020-01-30 NOTE — Telephone Encounter (Signed)
Attempted to help patient get logged on for a virtual.  Unable to get him logged on.  Patient checked settings, but when he would click yes for the microphone/camera it would log him in then out with an error message.  Provider will call patient.

## 2020-01-30 NOTE — Progress Notes (Signed)
This is a Pediatric Specialist E-Visit (My Chart Video Visit) follow up consult provided via WebEx Marquize Seib consented to an E-Visit consult today.  Location of patient: Lucas Cunningham is at home  Location of provider: Zachery Conch, PharmD, CPP, CDCES is at office.   Endocrinology provider: Dr. Fransico Michael (upcoming appt 03/14/19 9:00 am)  Dietitian: [name], RD (no upcoming appt) -Previous appts on 06/10/18, 02/25/19, and 12/22/2019  Behavioral health specialist: Dr. Huntley Dec (no upcoming appt) -No prior appt  Patient referred by Dr. Fransico Michael for diabetes education. PMH is significant for T1DM with severe insulin resistance, acanthosis, diabetic polyneuropathy, morbid obesity, prior hx of COVID-19 infection 2019-11-15), and depression. Patient was lost to follow up from 02/25/19 to 12/03/19. At prior appt with Dr. Fransico Michael on 12/03/19, multiple issues were discussed. Patient states Spring/Summer 2021 he lost focus on his DM management due to how his mother passed away (now living with his grandmother). He has been guessing at his Novolog doses (not following the sliding scale plan provided by Dr Vanessa Negaunee on 11/04/18). He has SEVERE neuropathy; he can't stand up from a sitting position without using his arms for support and he also experiences numbness and loss of strength in the palm of his right hand. He was previously followed by Dr. Devonne Doughty, but has "aged out" of that practice. He was a no show for two NP appointments at [facility], once in 10-15-22 when his mother died and once in 15-Nov-2022 when hew was admitted with DKA. Dr. Fransico Michael asked the grandmother to call [facility] and obtain a new appointment for him. Patient did mention though at prior appt with Dr Fransico Michael that he wants help in managing his DM. Dr. Fransico Michael advised patient to continue Tresiba 100 units daily (200 units/mL pen) and to re-start Novolog sliding scale + fixed dose of Novolog 10 units three times daily prior to  meals. Dr. Fransico Michael would like to transition patient to a 2 component plan (carb + correction factor) for rapid acting insulin in the future.  At prior appt on 01/27/2020, Dexcom report showed TIR 0%, 0% low/very low. Patient was having issues with adherence. Focus of visit was discussion of adherence to [place] and Afrezza/Novolog.  I connected with [name] on 01/30/20 by video and verified that I am speaking with the correct person using two identifiers.   I discussed the limitations, risks, security and privacy concerns of performing an evaluation and management service by video and the availability of in person appointments. I also discussed with the patient that there may be a patient responsible charge related to this service. The patient expressed understanding and agreed to proceed.  [name] states since last appt on 01/27/20 he has been doing well. He has been doing better with remembering to take [place], but admits to still forgetting to take Novolog. He is unable to take Afrezza as he has left it at room temperature too long. He has not been able to attain refill of Afrezza from pharmacy as of today.  School: not in school right now; planning to re-enrol in [employer] (he is unsure of time of re-enroll)  Insurance Coverage: Managed Medicaid [facility] plan; ID # [id number])  Diabetes Diagnosis: 04/23/18  Family History: T2DM (maternal grandmother, paternal grandfather); T1DM (paternal grandmother); DM (uncle)  Patient-Reported BG Readings:  -Patient denies hypoglycemic events. --Treats hypoglycemic episode with candy (2 starbusts) --Hypoglycemic symptoms: "cold, calm, peaceful"  Preferred Pharmacy [facility] [phone] - [place], [place] - [address]  [address] [place] [address]  Phone:  [phone] Fax:  [phone]  DEA #:  [id number]  Medication Adherence -Patient denies adherence with [name] and  Afrezza/Novolog (forgets almost 2x daily; has been trying to take every 3 hours) -Current diabetes medications include: Tresiba 100 units daily (200 units/mL pen), Afrezza/Novolog (Target BG 200, ISF 13, ICR 4) -Prior diabetes medications include: none  Injection Sites (no changes since prior appt on 01/27/20) -Patient-reports injection sites are right arm, abdomen, right leg --Patient reports independently injecting DM medications. --Patient reports rotating injection sites  Diet (changes since prior appt on 01/27/20) Patient reported dietary habits (gets $90 in food stamps each month) Eats 2 meals/day and 1 snacks/day Breakfast (8:30-9:00 am): 3 eggs with 2 pieces of whote wheat nature's own brand toast sometimes [place] breast or bologna --Carbs: 11 g per slice of toast, 22 g total Lunch (11am -12 pm): chicken/tuna with [place] chicken flavor ramen (2 bags at a time) and mayonnaise; has been eating regular cup of noodles (will eat 1 at a time)  --Carbs: 52 g per bag, 104 g total  Dinner (10-11pm): will skip dinner occasionally, noodles with chicken/tuna and mayonnaise; eats dinner sometimes - if he does then it's cup of noodles  Snacks: 1 small bag of doritos (18 grams of carb) once daily  Drinks: coffee with powder creamer, water (24 bottle case in 2-3 days), no more shake em up powder (sugar free, starburst), no milk   Exercise (no changes since prior appt on 01/06/20) Patient-reported exercise habits: walks around house (10 min at a time, 2x daily), sometimes walk around porch; leg kicks in bed everyday for 5 min, doing exercises recommended by PT (using band, marching) does for 5 minutes each day   Monitoring: Patient 1-2x episodes of nocturia (nighttime urination).  Patient reports neuropathy (nerve pain) in his arm and now reports tingling in both legs (not feet) Patient denies visual changes. (Followed by ophthalmology) Patient reports self foot exams.  -Patient reports  socks/slippers in the house and shoes outside.  -Patient reports monitoring for open wounds/cuts on her feet; no open cuts/wounds.     There were no vitals filed for this visit.  Lab Results  Component Value Date   HGBA1C >14 01/26/2020   HGBA1C 14.4 (H) 10/21/2019   HGBA1C 14.0 (A) 02/25/2019   HGBA1C >14 02/25/2019    Lab Results  Component Value Date   CPEPTIDE 3.8 04/23/2018       Assessment:  Patient's goal with DM management: "Get diabetes controlled as independently as possible"  TIR 0%, 0% low/very low. Patient's 99% very high and <1% high. DM remains uncontrolled, however, control is improving. At last appointment he was having issues with adherence to [place] and Afrezza/Novolog. At this appointment he is having issues with adherence to Afrezza/Novolog. Discussed case with Dr. Quincy Sheehan (expertise appreciated) - will now focus on making basal bolus ratio 40:60 as well as simplifying regimen considering his socioeconomic factors (lack of social support, limited access to food (on food stamps)) and severe one sided neuropathy in his arm. Will decrease Tresiba 100 units daily --> 50 units daily. For breakfast, advise patient to take 2 of the 8 unit green Afrezza cartridges (total = 16 units). For lunch, advised patient to take 2 of the 12 unit yellow Afrezza cartridges (total = 24 units). If he forgets to take Afrezza out of the fridge then take Humalog U200 20 units for breakfast  and 40 units for lunch. Will also initiate metformin XR 500 mg once daily. Scheduled follow up for 1 week (02/05/20 11:00 am)  Plan: 1. Medications:  a. Decrease Tresiba 100 units daily --> 50 units daily b. Change Afrezza to fixed dose regimen i. Breakfast: 2 of the 8 unit green cartridges (total = 16 units) ii. Lunch: 2 of the 12 unit yellow cartridges (total = 24 units) c. If he forgets to take Afrezza out of the fridge then administer Humalog U200  i. Breakfast: 20 units ii. Lunch: 40  units d. Initiate metformin XR 500 mg once daily e. Emailed instructions to [email or web address] f. Designer, fashion/clothing. Afrezza, Humalog U200, metformin, Dexcom G6 sensors, and Dexcom G6 transmitters will be available for pickup after 5PM today. 2. Monitoring a. Continue Dexcom G6 CGM b. Damarien Nyman has a diagnosis of diabetes, checks blood glucose readings > 4x per day, treats with > 3 insulin injections, and requires frequent adjustments to insulin regimen. This patient will be seen every six months, minimally, to assess adherence to their CGM regimen and diabetes treatment plan 3. Follow Up: 02/05/20 11:30 am  This appointment required 30 minutes of patient care (this includes precharting, chart review, review of results, face-to-face care, etc.).  Thank you for involving clinical pharmacist/diabetes educator to assist in providing this patient's care.  Zachery Conch, PharmD, CPP, CDCES

## 2020-01-31 NOTE — Progress Notes (Signed)
Endocrinology provider: Dr. Fransico Michael (upcoming appt 03/14/19 9:00 am)  Dietitian: [name], RD (no upcoming appt) -Previous appts on 06/10/18, 02/25/19, and 12/22/2019  Behavioral health specialist: Dr. Huntley Dec (no upcoming appt) -No prior appt  Patient referred by Dr. Fransico Michael for diabetes education. PMH is significant for T1DM with severe insulin resistance, acanthosis, diabetic polyneuropathy, morbid obesity, prior hx of COVID-19 infection 11-24-19), and depression. Patient was lost to follow up from 02/25/19 to 12/03/19. At prior appt with Dr. Fransico Michael on 12/03/19, multiple issues were discussed. Patient states Spring/Summer 2021 he lost focus on his DM management due to how his mother passed away (now living with his grandmother). He has been guessing at his Novolog doses (not following the sliding scale plan provided by Dr Vanessa  on 11/04/18). He has SEVERE neuropathy; he can't stand up from a sitting position without using his arms for support and he also experiences numbness and loss of strength in the palm of his right hand. He was previously followed by Dr. Devonne Doughty, but has "aged out" of that practice. He was a no show for two NP appointments at [facility], once in 10/24/2022 when his mother died and once in 24-Nov-2022 when hew was admitted with DKA. Dr. Fransico Michael asked the grandmother to call [facility] and obtain a new appointment for him. Patient did mention though at prior appt with Dr Fransico Michael that he wants help in managing his DM. Dr. Fransico Michael advised patient to continue Tresiba 100 units daily (200 units/mL pen) and to re-start Novolog sliding scale + fixed dose of Novolog 10 units three times daily prior to meals. Dr. Fransico Michael would like to transition patient to a 2 component plan (carb + correction factor) for rapid acting insulin in the future.  At prior appt on 01/30/2020, Dexcom report showed TIR 0%, 0% low/very low. Patient's adherence to DM medications improved. [name]  was decreased from 100 units daily to 50 units daily. Afrezza regimen was changed to a fixed dose regimen; breakfast: 2 of the 8 unit green cartridges (total = 16 units) and lunch: 2 of the 12 unit yellow cartridges (total = 24 units). If he forgets to take Afrezza out of the fridge he was instructed to administer Humalog U200; breakfast: 20 units and lunch: 40 units. Metformin XR 500 mg daily was initiated.  Patient presents for follow up appt with his grandmother. He states he forgot [place] twice in the past week. He has been forgetting to take the Humalog 20 units three times this past week. He does not forget to take Humalog 40 units in the evening. He has not been using Afrezza. Patient is now able to move his right arm. He states he likes his new insulin dosing regimen, but would like to have more time to get used to his doses prior to another insulin dose change. He reports adherence to metformin. He confirms he has not had any GI upset with metformin. He reports he has had issues picking up Dexcom from [place].  School: not in school right now; planning to re-enrol in [place] (he is unsure of time of re-enroll)  Insurance Coverage: Managed Medicaid [facility] plan; ID # [id number])  Diabetes Diagnosis: 04/23/18  Family History: T2DM (maternal grandmother, paternal grandfather); T1DM (paternal grandmother); DM (uncle)  Patient-Reported BG Readings:  -Patient denies hypoglycemic events. --Treats hypoglycemic episode with candy (2 starbusts) --Hypoglycemic symptoms: "cold, calm, peaceful"  Preferred Pharmacy [facility] [phone] - , [place] - [address]  [address] [place] [address]  Phone:  [phone] Fax:  [phone]  DEA #:  [id number]  Medication Adherence -Patient denies adherence with [name] and Afrezza/Novolog -Current diabetes medications include: Tresiba 50 units daily (200 units/mL pen),  Afrezza/HumalogU200, metformin XR 500 mg daily  --Afrezza: breakfast: 2 of the 8 unit green cartridges (total = 16 units) and lunch: 2 of the 12 unit yellow cartridges (total = 24 units). --If he forgets to take Afrezza out of the fridge he was instructed to administer Humalog U200; breakfast: 20 units and lunch: 40 units. -Prior diabetes medications include: none  Injection Sites (no changes since prior appt on 01/30/20) -Patient-reports injection sites are right arm, abdomen, right leg --Patient reports independently injecting DM medications. --Patient reports rotating injection sites  Diet (changes since prior appt on 01/30/20) Patient reported dietary habits (gets $90 in food stamps each month) Eats 2 meals/day and 1 snacks/day Breakfast (8:30-9:00 am): 3 eggs with 2 pieces of whote wheat nature's own brand toast sometimes [place] breast or bologna; now eating rice sometimes  --Carbs: 11 g per slice of toast, 22 g total Lunch (11am -12 pm): hard boiled eggs and bologna sandwich  --Carbs: 52 g per bag, 104 g total  Dinner (10-11pm): still skipping   Snacks: 1 bag of plan chips  Drinks: coffee, water (24 bottle case in 2-3 days)  Exercise (changes since prior appt on 01/30/20) Patient-reported exercise habits: walks around house (10 min at a time, 2x daily), sometimes walk around porch; sometimes leg kicks in bed everyday for 5 min, doing exercises recommended by PT (using band, marching) does for 5 minutes each day   Monitoring: Patient two episodes of nocturia (nighttime urination).  Patient reports neuropathy (nerve pain) in his arm and now denies tingling in both legs (not feet) Patient denies visual changes. (Followed by ophthalmology) Patient reports self foot exams.  -Patient denies socks/slippers in the house and reports shoes outside.  -Patient reports monitoring for open wounds/cuts on her feet; no open cuts/wounds.   Dexcom Clarity Report     There were no vitals  filed for this visit.  Lab Results  Component Value Date   HGBA1C >14 01/26/2020   HGBA1C 14.4 (H) 10/21/2019   HGBA1C 14.0 (A) 02/25/2019   HGBA1C >14 02/25/2019    Lab Results  Component Value Date   CPEPTIDE 3.8 04/23/2018       Assessment:  Patient's goal with DM management: "Get diabetes controlled as independently as possible"  DM is uncontrolled (TIR <70%), however, control is improving. Very high went from 99% --> 92%. High went from 1% --> 8%. Patient reports nonadherence to insulin and would like more time to work on adherence prior to dose increase. Appreciate patient's honesty and will hold off on insulin adjustment for now. We brainstormed a plan to increase adherence - grandmother gives [name] his metformin when she takes metformin. She will ask him if he has taken his insulin to help him remember. Since BG remain elevated will increase metformin XR 500 mg once daily --> metformin XR 500 mg twice daily on 02/06/20. Patient would like 2 weeks to work on adherence. Advised him to increase metformin XR  to 500 mg once daily after breakfast and 1000 mg once daily after lunch/dinner on 02/12/20. Advised [facility] to [facility] about [facility] issues. Provided sample sensor and changed sensor. Patient verbalized understanding. Follow up in 2 weeks.  Plan: 1. Medications:  a. Continue [name]  50 units daily b. Continue Afrezza to fixed dose regimen i. Breakfast: 2 of the 8 unit green cartridges (total = 16 units) ii. Lunch: 2 of the 12 unit yellow cartridges (total = 24 units) c. If he forgets to take Afrezza out of the fridge then administer Humalog U200  i. Breakfast: 20 units ii. Lunch: 40 units d. INCREASE metformin XR 500 mg once daily --> 500 mg twice daily on 02/06/20 -->  500 mg once daily after breakfast and 1000 mg once daily after lunch/dinner on 02/12/20 2. Monitoring a. Continue Dexcom G6 CGM b. [facility] c. Provided  Dexcom sensor/transmitter sample d. Swade Shonka has a diagnosis of diabetes, checks blood glucose readings > 4x per day, treats with > 3 insulin injections, and requires frequent adjustments to insulin regimen. This patient will be seen every six months, minimally, to assess adherence to their CGM regimen and diabetes treatment plan 3. Follow Up: 02/19/20 1:30 pm  This appointment required 60 minutes of patient care (this includes precharting, chart review, review of results, face-to-face care, etc.).  Thank you for involving clinical pharmacist/diabetes educator to assist in providing this patient's care.  Zachery Conch, PharmD, CPP, CDCES

## 2020-02-02 NOTE — Therapy (Signed)
[facility] [address] [place], [place], [address] Phone: [phone]   Fax:  [phone]  Physical Therapy Treatment  Patient Details  Name: Lucas Cunningham MRN: [id number] Date of Birth: [birth year] Referring Provider (PT): Lance Morin Torric NP   Encounter Date: 01/30/2020     01/30/20 1244  PT Visits / Re-Eval  Visit Number 4  Number of Visits 17  Authorization  Authorization Type [employer]  Authorization Time Period 16 visits 12/8-2/02/22  Authorization - Visit Number 3  Authorization - Number of Visits 16  PT Time Calculation  PT Start Time 1239 (pt running late for session)  PT Stop Time 1315  PT Time Calculation (min) 36 min  PT - End of Session  Activity Tolerance Patient tolerated treatment well;Patient limited by fatigue  Behavior During Therapy [facility] for tasks assessed/performed    Past Medical History:  Diagnosis Date  . Diabetes mellitus without complication (HCC)    type 1    History reviewed. No pertinent surgical history.  There were no vitals filed for this visit.     01/30/20 1242  Symptoms/Limitations  Subjective No new complaitns. No falls or pain. No issus with HEP. Started a new med for cholesterol, can't remember the name. Will bring it with him next time. Has an appt with Podiatrist do have toe nails taken care of on 02/11/20.  Patient is accompained by: Family member  Limitations Walking;Standing;House hold activities  How long can you stand comfortably? 10 mins  How long can you walk comfortably? 5 mins  Patient Stated Goals "I want to be able to bring in groceries and help around the house."  Pain Assessment  Currently in Pain? No/denies  Pain Score 0      01/30/20 1245  Transfers  Transfers Sit to Stand;Stand to Sit  Sit to Stand 6: Modified independent (Device/Increase time)  Stand to Sit 6: Modified independent (Device/Increase time)   Ambulation/Gait  Ambulation/Gait Yes  Ambulation/Gait Assistance 5: Supervision  Ambulation/Gait Assistance Details around gym with session  Assistive device Rolling walker  Gait Pattern Step-through pattern;Decreased stride length;Decreased dorsiflexion - right;Decreased dorsiflexion - left;Right steppage;Left steppage;Wide base of support;Trunk flexed  Ambulation Surface Level;Indoor  Exercises  Exercises Other Exercises  Other Exercises  seated at edge of mat: with 3# ankle weights- long arc quads, then alternating marching for 2 sets of 10 reps bil LE's. cues to stay up tall; with red theraband: hamstring curls for 2 sets of 10 reps with cues for slow, controlled movements.  Knee/Hip Exercises: Aerobic  Other Aerobic Scifit UE/LE's level 3.0 x 8 minutes with goal >/= 45 rpm for strengthening and activitity tolerance.         PT Short Term Goals - 01/15/20 1417      PT SHORT TERM GOAL #1   Title Pt will be IND with initial HEP in order to indicate improved function and balance (Target Date: 02/14/2020)    Baseline dependent    Time 4    Period Weeks    Status New    Target Date 02/14/20      PT SHORT TERM GOAL #2   Title Will assess B AFOs and get [employer] involved as needed to decrease fall risk and improve efficiency of gait.    Baseline did not have time to assess    Time 4    Period Weeks    Status New      PT SHORT TERM GOAL #3   Title  Pt will improve gait speed to >/= 2.16 ft/sec w/ LRAD and B AFO as needed to indicate dec fall risk.    Baseline 1.56 ft/sec with RW, no AFOs    Time 4    Period Weeks    Status New      PT SHORT TERM GOAL #4   Title Pt will improve 5TSS to </=18 secs with BUE support in order to indicate improved strength.    Baseline 21.75 secs with BUE support    Time 4    Period Weeks    Status New      PT SHORT TERM GOAL #5   Title Will assess TUG and update STG/LTG as able    Baseline did not have time to assess    Time 4    Period Weeks     Status New      Additional Short Term Goals   Additional Short Term Goals Yes      PT SHORT TERM GOAL #6   Title Pt will be able to stand statically without support x 15 secs in order to indicate improved balance and strength.    Baseline 2-3 secs only without LOB posteriorly    Time 4    Period Weeks    Status New             PT Long Term Goals - 01/15/20 1424      PT LONG TERM GOAL #1   Title Pt will be IND with final HEP in order to indicate improved functional mobility and dec fall risk.  (Target Date: 03/15/20)    Baseline dependent    Time 8    Period Weeks    Status New    Target Date 03/15/20      PT LONG TERM GOAL #2   Title Pt will improve gait speed to >/=2.62 ft/sec with LRAD and B AFOs as needed in order to indicate safe community ambulation.    Baseline 1.56 ft/sec withRW and no AFOs    Time 8    Period Weeks    Status New      PT LONG TERM GOAL #3   Title Pt will improve 5TSS to </=16 secs with single UE support only in order to indicate improved functional strength.    Baseline 21.75 secs with BUE support    Time 8    Period Weeks    Status New      PT LONG TERM GOAL #4   Title Pt will ambulate x 500' over unlevel paved surfaces w/ LRAD and B AFOs as needed at mod I level in order to indicate safe community mobility.    Baseline unable to assess, but is fatigued with 100' indoor gait with RW    Time 8    Period Weeks    Status New      PT LONG TERM GOAL #5   Title Pt will perform dynamic standing balance with intermittent UE support x 5 mins in order to perform ADLs safely at home.    Baseline Needs BUE support as this time, unable to stand statically without support    Time 8    Period Weeks    Status New             01/30/20 1245  Plan  Clinical Impression Statement Today's skilled session continued to focus on strengthening with very few rest  breaks needed. The pt is progressing toward goals and should benefit from continued PT to  progress toward unmet goals.  Personal Factors and Comorbidities Comorbidity 3+;Time since onset of injury/illness/exacerbation  Comorbidities see above  Examination-Activity Limitations Bathing;Dressing;Locomotion Level;Transfers;Squat;Stairs;Stand  Examination-Participation Restrictions Community Activity;Driving;School;Yard Work (school)  Pt will benefit from skilled therapeutic intervention in order to improve on the following deficits Abnormal gait;Decreased activity tolerance;Decreased balance;Decreased endurance;Decreased knowledge of precautions;Decreased knowledge of use of DME;Decreased mobility;Decreased safety awareness;Decreased strength;Difficulty walking;Impaired perceived functional ability;Impaired sensation;Impaired UE functional use;Postural dysfunction  Stability/Clinical Decision Making Evolving/Moderate complexity  Rehab Potential Good  PT Frequency 2x / week  PT Duration 8 weeks  PT Treatment/Interventions ADLs/Self Care Home Management;DME Instruction;Gait training;Stair training;Functional mobility training;Therapeutic activities;Therapeutic exercise;Balance training;Neuromuscular re-education;Patient/family education;Orthotic Fit/Training;Passive range of motion;Vestibular  PT Next Visit Plan Scifit for strengthening/activity tolerance, continue to work on strengthening and balance with UE support.  Consulted and Agree with Plan of Care Patient;Family member/caregiver  Family Member Consulted grandmother          Patient will benefit from skilled therapeutic intervention in order to improve the following deficits and impairments:  Abnormal gait,Decreased activity tolerance,Decreased balance,Decreased endurance,Decreased knowledge of precautions,Decreased knowledge of use of DME,Decreased mobility,Decreased safety awareness,Decreased strength,Difficulty walking,Impaired perceived functional ability,Impaired sensation,Impaired UE functional use,Postural  dysfunction  Visit Diagnosis: Unsteadiness on feet  Muscle weakness (generalized)  Foot drop, left  Foot drop, right  Other abnormalities of gait and mobility     Problem List Patient Active Problem List   Diagnosis Date Noted  . 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 DKA, type 1 (HCC) 10/23/2019  . DKA (diabetic ketoacidosis) (HCC) 10/21/2019  . COVID-19 virus infection 10/21/2019  . Diabetic polyneuropathy associated with type 1 diabetes mellitus (HCC) 02/26/2019  . Leg weakness, bilateral 02/25/2019  . Postprandial bloating 02/25/2019  . Sensory problems with limbs 02/25/2019  . Ketonuria 11/04/2018  . Diabetes (HCC) 04/23/2018  . Uncontrolled type 1 diabetes mellitus with hyperglycemia (HCC) 05/24/2017  . Morbid childhood obesity with BMI greater than 99th percentile for age [facility]) 05/24/2017  . Insulin resistance 05/24/2017  . Acanthosis 05/24/2017    Sallyanne Kuster, PTA, [facility] [address], [address] [place], [place] [address] [phone] 02/02/20, 11:49 AM   Name: Lucas Cunningham MRN: [id number] Date of Birth: 2000/12/20

## 2020-02-02 NOTE — Telephone Encounter (Signed)
Attempted to call patient, grandma answered phone.  She confirmed appointments with neuro the next 2 days and with Dr Ladona Ridgel on the 23rd.  I relayed Dr. Lubertha Basque message about podiatry to her,  She was thankful.  She then asked about his metformin and if that was really suppose to only be 1 pill per day.  I confirmed the dosage as well letting her know the Afrezza and Tresiba doses were also changed.  She verbalized understanding and will see [name] on the 23rd.

## 2020-02-03 ENCOUNTER — Ambulatory Visit: Payer: Medicaid Other | Admitting: Physical Therapy

## 2020-02-03 ENCOUNTER — Telehealth: Payer: Self-pay | Admitting: Physical Therapy

## 2020-02-03 ENCOUNTER — Ambulatory Visit: Payer: Medicaid Other | Admitting: Occupational Therapy

## 2020-02-03 ENCOUNTER — Encounter: Payer: Self-pay | Admitting: Occupational Therapy

## 2020-02-03 ENCOUNTER — Encounter: Payer: Self-pay | Admitting: Physical Therapy

## 2020-02-03 ENCOUNTER — Other Ambulatory Visit: Payer: Self-pay

## 2020-02-03 DIAGNOSIS — R2689 Other abnormalities of gait and mobility: Secondary | ICD-10-CM

## 2020-02-03 DIAGNOSIS — R2681 Unsteadiness on feet: Secondary | ICD-10-CM

## 2020-02-03 DIAGNOSIS — M6281 Muscle weakness (generalized): Secondary | ICD-10-CM

## 2020-02-03 DIAGNOSIS — M21371 Foot drop, right foot: Secondary | ICD-10-CM

## 2020-02-03 DIAGNOSIS — M25531 Pain in right wrist: Secondary | ICD-10-CM

## 2020-02-03 DIAGNOSIS — R278 Other lack of coordination: Secondary | ICD-10-CM

## 2020-02-03 DIAGNOSIS — R29818 Other symptoms and signs involving the nervous system: Secondary | ICD-10-CM

## 2020-02-03 DIAGNOSIS — M21372 Foot drop, left foot: Secondary | ICD-10-CM

## 2020-02-03 NOTE — Therapy (Signed)
[facility] [address] [place], [place], [address] Phone: [phone]   Fax:  [phone]  Occupational Therapy Treatment  Patient Details  Name: Lucas Cunningham MRN: [id number] Date of Birth: 19-Mar-2000 Referring Provider (OT): Frederico Hamman, NP   Encounter Date: 02/03/2020   OT End of Session - 02/03/20 1235    Visit Number 5    Number of Visits 17    Date for OT Re-Evaluation 03/11/20    Authorization Type [employer]    Authorization Time Period 27 OT/PT/ST    OT Start Time 1233    OT Stop Time 1315    OT Time Calculation (min) 42 min    Activity Tolerance Patient tolerated treatment well    Behavior During Therapy [facility] for tasks assessed/performed           Past Medical History:  Diagnosis Date  . Diabetes mellitus without complication (HCC)    type 1    History reviewed. No pertinent surgical history.  There were no vitals filed for this visit.   Subjective Assessment - 02/03/20 1235    Subjective  Pt says "not really any pain my hand just feels weird"    Patient is accompanied by: Family member   grandmother   Pertinent History PMH Type I diabetes, depression, peripheral neuropathy    Limitations Fall Risk. Sensation deficits in RUE    Patient Stated Goals Pt wants "to be able to grab things and write and use my fingers"    Currently in Pain? Yes    Pain Score 4     Pain Location Hand    Pain Orientation Right    Pain Descriptors / Indicators Heaviness    Pain Type Neuropathic pain    Pain Onset More than a month ago    Pain Frequency Constant                        OT Treatments/Exercises (OP) - 02/03/20 1239      Exercises   Exercises Hand      Wrist Exercises   Other wrist exercises AROM for RUE wrist flexion/extension supination and pronation - pt with trace activation possible in wrist extension this day and increased activation with supination and pronation     Other wrist exercises AAROM wrist flexion/extension x 10 on RUE in side position with gravity eliminated      Hand Exercises   Other Hand Exercises Hand Gripper level 1 with LUE for increasing grip strength and wrist strength for LUE      Neurological Re-education Exercises   Other Weight-Bearing Exercises 1 standing and RUE weight bearing into table while completing resistance clothespins with LUE 1-8# with max difficulty with black (8#). brought back down with LUE while seated      Sensation Exercises   Desensitization using textured ball on RUE for desensitization                    OT Short Term Goals - 01/29/20 1442      OT SHORT TERM GOAL #1   Title Pt will be independent with initial HEP targeting grip strength and range of motion    Baseline not issued yet    Time 4    Period Weeks    Status On-going    Target Date 02/12/20      OT SHORT TERM GOAL #2   Title Pt will verbalize understanding of sensory strategies for RUE  and safety    Baseline have not reviewed    Time 4    Period Weeks    Status On-going      OT SHORT TERM GOAL #3   Title Pt will increase grip strength in LUE by 5 lbs and RUE by 2 lbs for increase in functional use of BUE for ADLs and IADLs    Baseline RUE 5 LUE 29.5    Time 4    Period Weeks    Status On-going      OT SHORT TERM GOAL #4   Title Pt will perform donning and doffing socks and shoes with supervision for increase in independence with ADLs    Baseline unable to don socks consistently    Time 4    Period Weeks    Status New      OT SHORT TERM GOAL #5   Title Pt will perform simple cold meal prep (sandwich) and/or light housekeeping with supervision    Baseline not completing at this time    Time 4    Period Weeks    Status New             OT Long Term Goals - 01/15/20 1505      OT LONG TERM GOAL #1   Title Pt will be independent with updated HEP 03/11/2020    Baseline not issued yet    Time 8    Period Weeks     Status New    Target Date 03/11/20      OT LONG TERM GOAL #2   Title Pt will perform simple warm meal prep and/or light house keeping task with mod I and good safety awareness    Baseline not completing at eval    Time 8    Period Weeks    Status New      OT LONG TERM GOAL #3   Title Pt will improve grip strengthening in LUE by 10 lbs and RUE by 6 lbs for increased ability to perform clothing management for ADLs    Baseline LUE 29.5 RUE 5    Time 8    Period Weeks    Status New      OT LONG TERM GOAL #4   Title Pt will increased box and blocks score in RUE to 12 blocks or more for increase in functional use of RUE    Baseline RUE 6    Time 8    Period Weeks    Status New      OT LONG TERM GOAL #5   Title Pt will report decrease pain in RUE wrist with functional use to no more than 6/10    Baseline 9/10    Time 8    Period Weeks    Status New      OT LONG TERM GOAL #6   Title Pt will complete basic ADLs mod Independent with adapted equipment and strategies PRN    Baseline difficulty with donning socks, cutting up food and fasteners    Time 8    Period Weeks    Status New                 Plan - 02/03/20 1322    Clinical Impression Statement Pt is progressing towards goals. Pt continues to be limited by neuropathy in RUE and some in LUE for functional UE use    OT Occupational Profile and History Problem Focused Assessment - Including review of records relating to presenting problem  Occupational performance deficits (Please refer to evaluation for details): IADL's;ADL's;Leisure;Social Participation    Body Structure / Function / Physical Skills ADL;Balance;Coordination;Decreased knowledge of use of DME;Edema;Flexibility;FMC;Dexterity;Strength;GMC;Pain;Tone;UE functional use;ROM;IADL;Sensation    Psychosocial Skills Coping Strategies    Rehab Potential Good    Clinical Decision Making Limited treatment options, no task modification necessary    Comorbidities  Affecting Occupational Performance: None    Modification or Assistance to Complete Evaluation  No modification of tasks or assist necessary to complete eval    OT Frequency 2x / week    OT Duration 8 weeks    OT Treatment/Interventions Self-care/ADL training;Moist Heat;DME and/or AE instruction;Splinting;Balance training;Therapeutic activities;Aquatic Therapy;Therapeutic exercise;Ultrasound;Electrical Stimulation;Cryotherapy;Neuromuscular education;Paraffin;Energy conservation;Manual Therapy;Patient/family education;Passive range of motion;Functional Mobility Training    Plan kitchen mobility - simple cooking?    Recommended Other Services Pt is receiving PT    Consulted and Agree with Plan of Care Patient;Family member/caregiver    Family Member Consulted grandmother           Patient will benefit from skilled therapeutic intervention in order to improve the following deficits and impairments:   Body Structure / Function / Physical Skills: ADL,Balance,Coordination,Decreased knowledge of use of DME,Edema,Flexibility,FMC,Dexterity,Strength,GMC,Pain,Tone,UE functional use,ROM,IADL,Sensation   Psychosocial Skills: Coping Strategies   Visit Diagnosis: Unsteadiness on feet  Muscle weakness (generalized)  Pain in right wrist  Other abnormalities of gait and mobility  Other lack of coordination  Other symptoms and signs involving the nervous system    Problem List Patient Active Problem List   Diagnosis Date Noted  . 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 DKA, type 1 (HCC) 10/23/2019  . DKA (diabetic ketoacidosis) (HCC) 10/21/2019  . COVID-19 virus infection 10/21/2019  . Diabetic polyneuropathy associated with type 1 diabetes mellitus (HCC) 02/26/2019  . Leg weakness, bilateral 02/25/2019  . Postprandial bloating 02/25/2019  . Sensory problems with limbs 02/25/2019  . Ketonuria 11/04/2018  . Diabetes (HCC) 04/23/2018  . Uncontrolled  type 1 diabetes mellitus with hyperglycemia (HCC) 05/24/2017  . Morbid childhood obesity with BMI greater than 99th percentile for age [facility]) 05/24/2017  . Insulin resistance 05/24/2017  . Acanthosis 05/24/2017    Junious Dresser  MOT, OTR/L  02/03/2020, 1:23 PM  [facility] [address] [place], [place], [address] Phone: [phone]   Fax:  [phone]  Name: Rickard Kennerly MRN: [id number] Date of Birth: Sep 27, 2000

## 2020-02-03 NOTE — Telephone Encounter (Signed)
Dr. Fransico Michael,  Alphonzo Lemmings is being treated by physical therapy for weakness and gait issues.  He will benefit from use of bilateral AFO's in order to improve safety with functional mobility and gait.   If you agree, please submit request in EPIC under MD Order, Other Orders (list bilateral AFO's in comments) or fax to [facility] at [phone].   Thank you,  Sallyanne Kuster, PTA, [facility] [address] [place], [place] [address] [phone] 02/03/20, 11:48 AM   [facility] [address] [place], [place]  [address] Phone:  [phone] Fax:  [phone]

## 2020-02-03 NOTE — Therapy (Signed)
[facility] [address] [place], [place], [address] Phone: [phone]   Fax:  [phone]  Physical Therapy Treatment  Patient Details  Name: Lucas Cunningham MRN: [id number] Date of Birth: 12/02/2000 Referring Provider (PT): Lance MorinSkinner-Kiser, Kawanna Torric NP   Encounter Date: 02/03/2020   PT End of Session - 02/03/20 1321    Visit Number 5    Number of Visits 17    Authorization Type [employer]    Authorization Time Period 16 visits 12/8-2/02/22    Authorization - Visit Number 4    Authorization - Number of Visits 16    PT Start Time 1316    PT Stop Time 1400    PT Time Calculation (min) 44 min    Equipment Utilized During Treatment Gait belt    Activity Tolerance Patient tolerated treatment well;Patient limited by fatigue    Behavior During Therapy WFL for tasks assessed/performed           Past Medical History:  Diagnosis Date  . Diabetes mellitus without complication (HCC)    type 1    History reviewed. No pertinent surgical history.  There were no vitals filed for this visit.   Subjective Assessment - 02/03/20 1318    Subjective No new complaints. No falls. Was having some left knee pain this morning, better now. Does report it feels like his legs want to "break backwards" when he stands.    Limitations Walking;Standing;House hold activities    How long can you stand comfortably? 10 mins    How long can you walk comfortably? 5 mins    Patient Stated Goals "I want to be able to bring in groceries and help around the house."    Currently in Pain? No/denies    Pain Score 0-No pain                  [facility] Adult PT Treatment/Exercise - 02/03/20 1322      Transfers   Transfers Sit to Stand;Stand to Sit    Sit to Stand 6: Modified independent (Device/Increase time)    Stand to Sit 6: Modified independent (Device/Increase time)      Ambulation/Gait   Ambulation/Gait Yes     Ambulation/Gait Assistance 5: Supervision    Ambulation/Gait Assistance Details cues to stay withing RW. attempted to have pt walk with "soft knees", unable to at this time. Will plan to try clinic braces with gait at next session.    Ambulation Distance (Feet) 100 Feet   x2   Assistive device Rolling walker    Gait Pattern Step-through pattern;Decreased stride length;Decreased dorsiflexion - right;Decreased dorsiflexion - left;Right steppage;Left steppage;Wide base of support;Trunk flexed    Ambulation Surface Level;Indoor      High Level Balance   High Level Balance Activities Side stepping    High Level Balance Comments in parallel bars for 2 laps each ways with cues to lift feet up/not slide them and to keep knees soft/not locked into hyper-extension. min guard assist to min assist toward end of second return lap due to knees buckling from fatigue.      Exercises   Exercises Other Exercises    Other Exercises  standing in parallel bars with UE support:  mini squats x 10 reps with cues/facilitation for tall posture with knees bending and to keep knees soft on return to standing. then with 2 inch box: alternating light foot taps for 10 reps each side with cues to keep stance knee "soft", min guard  assist for safety. then standing in bars with feet apart- alternating UE raises, progressing to hovering hands over bars for max 10 sec's, progressing to bil UE raises for ~10 reps each, min gaurd to min assist for balance with cues on posture, abd bracing to assist with balance.      Knee/Hip Exercises: Aerobic   Other Aerobic Scifit UE/LE's level 3.5 x 10 minutes with goal >/= 45 rpm for strengthening and activitity tolerance.               PT Short Term Goals - 01/15/20 1417      PT SHORT TERM GOAL #1   Title Pt will be IND with initial HEP in order to indicate improved function and balance (Target Date: 02/14/2020)    Baseline dependent    Time 4    Period Weeks    Status New    Target  Date 02/14/20      PT SHORT TERM GOAL #2   Title Will assess B AFOs and get [employer] involved as needed to decrease fall risk and improve efficiency of gait.    Baseline did not have time to assess    Time 4    Period Weeks    Status New      PT SHORT TERM GOAL #3   Title Pt will improve gait speed to >/= 2.16 ft/sec w/ LRAD and B AFO as needed to indicate dec fall risk.    Baseline 1.56 ft/sec with RW, no AFOs    Time 4    Period Weeks    Status New      PT SHORT TERM GOAL #4   Title Pt will improve 5TSS to </=18 secs with BUE support in order to indicate improved strength.    Baseline 21.75 secs with BUE support    Time 4    Period Weeks    Status New      PT SHORT TERM GOAL #5   Title Will assess TUG and update STG/LTG as able    Baseline did not have time to assess    Time 4    Period Weeks    Status New      Additional Short Term Goals   Additional Short Term Goals Yes      PT SHORT TERM GOAL #6   Title Pt will be able to stand statically without support x 15 secs in order to indicate improved balance and strength.    Baseline 2-3 secs only without LOB posteriorly    Time 4    Period Weeks    Status New             PT Long Term Goals - 01/15/20 1424      PT LONG TERM GOAL #1   Title Pt will be IND with final HEP in order to indicate improved functional mobility and dec fall risk.  (Target Date: 03/15/20)    Baseline dependent    Time 8    Period Weeks    Status New    Target Date 03/15/20      PT LONG TERM GOAL #2   Title Pt will improve gait speed to >/=2.62 ft/sec with LRAD and B AFOs as needed in order to indicate safe community ambulation.    Baseline 1.56 ft/sec withRW and no AFOs    Time 8    Period Weeks    Status New      PT LONG TERM GOAL #3   Title Pt  will improve 5TSS to </=16 secs with single UE support only in order to indicate improved functional strength.    Baseline 21.75 secs with BUE support    Time 8    Period Weeks    Status New       PT LONG TERM GOAL #4   Title Pt will ambulate x 500' over unlevel paved surfaces w/ LRAD and B AFOs as needed at mod I level in order to indicate safe community mobility.    Baseline unable to assess, but is fatigued with 100' indoor gait with RW    Time 8    Period Weeks    Status New      PT LONG TERM GOAL #5   Title Pt will perform dynamic standing balance with intermittent UE support x 5 mins in order to perform ADLs safely at home.    Baseline Needs BUE support as this time, unable to stand statically without support    Time 8    Period Weeks    Status New                 Plan - 02/03/20 1321    Clinical Impression Statement Today's skilled session continued to address activitiy tolerance and strengthening with rest breaks taken as needed. No other issues noted or reported in session. The pt is progressing toward goals and should benefit from continued PT to progress toward unmet goals.    Personal Factors and Comorbidities Comorbidity 3+;Time since onset of injury/illness/exacerbation    Comorbidities see above    Examination-Activity Limitations Bathing;Dressing;Locomotion Level;Transfers;Squat;Stairs;Stand    Examination-Participation Restrictions Community Activity;Driving;School;Yard Work   Associate Professor Evolving/Moderate complexity    Rehab Potential Good    PT Frequency 2x / week    PT Duration 8 weeks    PT Treatment/Interventions ADLs/Self Care Home Management;DME Instruction;Gait training;Stair training;Functional mobility training;Therapeutic activities;Therapeutic exercise;Balance training;Neuromuscular re-education;Patient/family education;Orthotic Fit/Training;Passive range of motion;Vestibular    PT Next Visit Plan trial some clinic braces with gait for improved knee control (decreased recurvatu,) and improved foot clearance with heel strike.    Consulted and Agree with Plan of Care Patient;Family member/caregiver    Family  Member Consulted grandmother           Patient will benefit from skilled therapeutic intervention in order to improve the following deficits and impairments:  Abnormal gait,Decreased activity tolerance,Decreased balance,Decreased endurance,Decreased knowledge of precautions,Decreased knowledge of use of DME,Decreased mobility,Decreased safety awareness,Decreased strength,Difficulty walking,Impaired perceived functional ability,Impaired sensation,Impaired UE functional use,Postural dysfunction  Visit Diagnosis: Unsteadiness on feet  Muscle weakness (generalized)  Other abnormalities of gait and mobility  Foot drop, left  Foot drop, right     Problem List Patient Active Problem List   Diagnosis Date Noted  . 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 DKA, type 1 (HCC) 10/23/2019  . DKA (diabetic ketoacidosis) (HCC) 10/21/2019  . COVID-19 virus infection 10/21/2019  . Diabetic polyneuropathy associated with type 1 diabetes mellitus (HCC) 02/26/2019  . Leg weakness, bilateral 02/25/2019  . Postprandial bloating 02/25/2019  . Sensory problems with limbs 02/25/2019  . Ketonuria 11/04/2018  . Diabetes (HCC) 04/23/2018  . Uncontrolled type 1 diabetes mellitus with hyperglycemia (HCC) 05/24/2017  . Morbid childhood obesity with BMI greater than 99th percentile for age [facility]) 05/24/2017  . Insulin resistance 05/24/2017  . Acanthosis 05/24/2017   Sallyanne Kuster, PTA, [facility] [address] [place], [place] [address] [phone] 02/03/20,  2:14 PM   Name: Lucas Cunningham MRN: [id number] Date of Birth: September 07, 2000

## 2020-02-03 NOTE — Patient Instructions (Addendum)
Sensory Reeducation Strategies  Textured items - rub against RIGHT hand. (I.e. towel, toothbrush, spiky sensory balls, etc)  Rice Bin with different items in it for finding objects (paper clip, keys, poker chips, bottle caps, etc)    Tips for safety with sensory deficits:  1. Test hot/cold with left hand before touching with your right. 2. Make sure to stay away from radiators, heat or really cold items on the right side

## 2020-02-04 ENCOUNTER — Ambulatory Visit: Payer: Medicaid Other | Admitting: Physical Therapy

## 2020-02-04 ENCOUNTER — Ambulatory Visit: Payer: Medicaid Other | Admitting: Occupational Therapy

## 2020-02-04 ENCOUNTER — Encounter: Payer: Self-pay | Admitting: Occupational Therapy

## 2020-02-04 DIAGNOSIS — M6281 Muscle weakness (generalized): Secondary | ICD-10-CM | POA: Diagnosis not present

## 2020-02-04 DIAGNOSIS — R2689 Other abnormalities of gait and mobility: Secondary | ICD-10-CM

## 2020-02-04 DIAGNOSIS — R29818 Other symptoms and signs involving the nervous system: Secondary | ICD-10-CM

## 2020-02-04 DIAGNOSIS — M25531 Pain in right wrist: Secondary | ICD-10-CM

## 2020-02-04 DIAGNOSIS — R278 Other lack of coordination: Secondary | ICD-10-CM

## 2020-02-04 DIAGNOSIS — R2681 Unsteadiness on feet: Secondary | ICD-10-CM

## 2020-02-04 NOTE — Therapy (Signed)
[facility] [address] [place], [place], [address] Phone: [phone]   Fax:  [phone]  Occupational Therapy Treatment  Patient Details  Name: Lucas Cunningham MRN: [id number] Date of Birth: [birth year] Referring Provider (OT): Frederico Hamman, NP   Encounter Date: 02/04/2020   OT End of Session - 02/04/20 1153    Visit Number 6    Number of Visits 17    Date for OT Re-Evaluation 03/11/20    Authorization Type [employer]    Authorization Time Period 27 OT/PT/ST    OT Start Time 1152    OT Stop Time 1230    OT Time Calculation (min) 38 min    Activity Tolerance Patient tolerated treatment well    Behavior During Therapy [facility] for tasks assessed/performed           Past Medical History:  Diagnosis Date  . Diabetes mellitus without complication (HCC)    type 1    History reviewed. No pertinent surgical history.  There were no vitals filed for this visit.   Subjective Assessment - 02/04/20 1154    Subjective  Pt denies any pain. Pt reports some discomfort in RUE after holding it in a fist position for extended period of time.    Patient is accompanied by: Family member   grandmother - waited in lobby   Pertinent History PMH Type I diabetes, depression, peripheral neuropathy    Limitations Fall Risk. Sensation deficits in RUE    Patient Stated Goals Pt wants "to be able to grab things and write and use my fingers"    Currently in Pain? No/denies                        OT Treatments/Exercises (OP) - 02/04/20 1228      Exercises   Exercises Work Hardening      Wrist Exercises   Other wrist exercises increased active movement with wrist extension tihs day. Worked on wrist extension with hitting cup with force, isometric hold swith elbow at 90 degrees on table and holding position of wrist in air with slow eccentric wrist flexion and extension for muscle control and facilitation.       Work Engineer, technical sales UEB x 5 minutes level 2 with RUE secured with mitt      Neurological Re-education Exercises   Other Weight-Bearing Exercises 1 grasp/release of cup x 5 with assistance for maintaining wrist extension for releasing cup and normal positioning and movement patterns    Other Weight-Bearing Exercises 2 grasp release of 1 inch blocks with RUE with assistance for wrist positioning      Modalities   Modalities Primary school teacher Stimulation Location RUE wrist extension    Electrical Stimulation Action 10 minutes NMES    Electrical Stimulation Parameters 50 pps 2 sec ramp, 10x10, level 17    Electrical Stimulation Goals Strength;Neuromuscular facilitation                    OT Short Term Goals - 01/29/20 1442      OT SHORT TERM GOAL #1   Title Pt will be independent with initial HEP targeting grip strength and range of motion    Baseline not issued yet    Time 4    Period Weeks    Status On-going    Target Date 02/12/20      OT SHORT  TERM GOAL #2   Title Pt will verbalize understanding of sensory strategies for RUE and safety    Baseline have not reviewed    Time 4    Period Weeks    Status On-going      OT SHORT TERM GOAL #3   Title Pt will increase grip strength in LUE by 5 lbs and RUE by 2 lbs for increase in functional use of BUE for ADLs and IADLs    Baseline RUE 5 LUE 29.5    Time 4    Period Weeks    Status On-going      OT SHORT TERM GOAL #4   Title Pt will perform donning and doffing socks and shoes with supervision for increase in independence with ADLs    Baseline unable to don socks consistently    Time 4    Period Weeks    Status New      OT SHORT TERM GOAL #5   Title Pt will perform simple cold meal prep (sandwich) and/or light housekeeping with supervision    Baseline not completing at this time    Time 4    Period Weeks    Status New             OT Long  Term Goals - 01/15/20 1505      OT LONG TERM GOAL #1   Title Pt will be independent with updated HEP 03/11/2020    Baseline not issued yet    Time 8    Period Weeks    Status New    Target Date 03/11/20      OT LONG TERM GOAL #2   Title Pt will perform simple warm meal prep and/or light house keeping task with mod I and good safety awareness    Baseline not completing at eval    Time 8    Period Weeks    Status New      OT LONG TERM GOAL #3   Title Pt will improve grip strengthening in LUE by 10 lbs and RUE by 6 lbs for increased ability to perform clothing management for ADLs    Baseline LUE 29.5 RUE 5    Time 8    Period Weeks    Status New      OT LONG TERM GOAL #4   Title Pt will increased box and blocks score in RUE to 12 blocks or more for increase in functional use of RUE    Baseline RUE 6    Time 8    Period Weeks    Status New      OT LONG TERM GOAL #5   Title Pt will report decrease pain in RUE wrist with functional use to no more than 6/10    Baseline 9/10    Time 8    Period Weeks    Status New      OT LONG TERM GOAL #6   Title Pt will complete basic ADLs mod Independent with adapted equipment and strategies PRN    Baseline difficulty with donning socks, cutting up food and fasteners    Time 8    Period Weeks    Status New                 Plan - 02/04/20 1312    Clinical Impression Statement Pt is progressing towards goals. Pt continues to be limited by neuropathy in RUE and some in LUE for functional UE use.    OT Occupational  Profile and History Problem Focused Assessment - Including review of records relating to presenting problem    Occupational performance deficits (Please refer to evaluation for details): IADL's;ADL's;Leisure;Social Participation    Body Structure / Function / Physical Skills ADL;Balance;Coordination;Decreased knowledge of use of DME;Edema;Flexibility;FMC;Dexterity;Strength;GMC;Pain;Tone;UE functional use;ROM;IADL;Sensation     Psychosocial Skills Coping Strategies    Rehab Potential Good    Clinical Decision Making Limited treatment options, no task modification necessary    Comorbidities Affecting Occupational Performance: None    Modification or Assistance to Complete Evaluation  No modification of tasks or assist necessary to complete eval    OT Frequency 2x / week    OT Duration 8 weeks    OT Treatment/Interventions Self-care/ADL training;Moist Heat;DME and/or AE instruction;Splinting;Balance training;Therapeutic activities;Aquatic Therapy;Therapeutic exercise;Ultrasound;Electrical Stimulation;Cryotherapy;Neuromuscular education;Paraffin;Energy conservation;Manual Therapy;Patient/family education;Passive range of motion;Functional Mobility Training    Plan kitchen mobility - simple cooking?    Recommended Other Services Pt is receiving PT    Consulted and Agree with Plan of Care Patient;Family member/caregiver    Family Member Consulted grandmother           Patient will benefit from skilled therapeutic intervention in order to improve the following deficits and impairments:   Body Structure / Function / Physical Skills: ADL,Balance,Coordination,Decreased knowledge of use of DME,Edema,Flexibility,FMC,Dexterity,Strength,GMC,Pain,Tone,UE functional use,ROM,IADL,Sensation   Psychosocial Skills: Coping Strategies   Visit Diagnosis: Muscle weakness (generalized)  Unsteadiness on feet  Other lack of coordination  Other abnormalities of gait and mobility  Pain in right wrist  Other symptoms and signs involving the nervous system    Problem List Patient Active Problem List   Diagnosis Date Noted  . 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 DKA, type 1 (HCC) 10/23/2019  . DKA (diabetic ketoacidosis) (HCC) 10/21/2019  . COVID-19 virus infection 10/21/2019  . Diabetic polyneuropathy associated with type 1 diabetes mellitus (HCC) 02/26/2019  . Leg weakness,  bilateral 02/25/2019  . Postprandial bloating 02/25/2019  . Sensory problems with limbs 02/25/2019  . Ketonuria 11/04/2018  . Diabetes (HCC) 04/23/2018  . Uncontrolled type 1 diabetes mellitus with hyperglycemia (HCC) 05/24/2017  . Morbid childhood obesity with BMI greater than 99th percentile for age [facility]) 05/24/2017  . Insulin resistance 05/24/2017  . Acanthosis 05/24/2017    Junious Dresser MOT, OTR/L  02/04/2020, 1:12 PM  [facility] [address] [place], [place], [address] Phone: [phone]   Fax:  [phone]  Name: Alicia Ackert MRN: [id number] Date of Birth: 07/13/2000

## 2020-02-05 ENCOUNTER — Ambulatory Visit (INDEPENDENT_AMBULATORY_CARE_PROVIDER_SITE_OTHER): Payer: Medicaid Other | Admitting: Pharmacist

## 2020-02-05 ENCOUNTER — Telehealth (INDEPENDENT_AMBULATORY_CARE_PROVIDER_SITE_OTHER): Payer: Self-pay | Admitting: Pharmacist

## 2020-02-05 ENCOUNTER — Other Ambulatory Visit: Payer: Self-pay

## 2020-02-05 VITALS — BP 112/68 | Wt 277.2 lb

## 2020-02-05 DIAGNOSIS — E1065 Type 1 diabetes mellitus with hyperglycemia: Secondary | ICD-10-CM | POA: Diagnosis not present

## 2020-02-05 LAB — POCT URINALYSIS DIPSTICK: Glucose, UA: POSITIVE — AB

## 2020-02-05 LAB — POCT GLUCOSE (DEVICE FOR HOME USE): POC Glucose: 368 mg/dl — AB (ref 70–99)

## 2020-02-05 NOTE — Patient Instructions (Addendum)
It was a pleasure seeing you today!  Today the plan is.. 1. Continue Tresiba 50 units daily 2. Remember for your meal time insulin a. Afrezza to fixed dose regimen i. Breakfast: 2 of the 8 unit green cartridges (total = 16 units) ii. Lunch: 2 of the 12 unit yellow cartridges (total = 24 units) b. If you forget to take Afrezza out of the fridge then administer Humalog U200  i. Breakfast: 20 units ii. Lunch: 40 units 3. INCREASE metformin XR 500 mg once daily --> metformin 500 mg (1 pill) after breakfast and metformin 500 mg (1 pill) after dinner on 02/06/20 --> metformin 500 mg (1 pill) after breakfast and metformin 1000 mg (2 pills) after dinner on 12/31  4. Call [facility] and see what is going on with Dexcom  5. I will see you next on January 6th 2021 at 1:30 pm   Please contact me (Dr. Ladona Ridgel) at [phone] or via Mychart with any questions/concerns

## 2020-02-09 ENCOUNTER — Ambulatory Visit: Payer: Medicaid Other | Admitting: Rehabilitation

## 2020-02-09 ENCOUNTER — Other Ambulatory Visit: Payer: Self-pay

## 2020-02-09 ENCOUNTER — Encounter: Payer: Self-pay | Admitting: Rehabilitation

## 2020-02-09 DIAGNOSIS — M6281 Muscle weakness (generalized): Secondary | ICD-10-CM | POA: Diagnosis not present

## 2020-02-09 DIAGNOSIS — R2689 Other abnormalities of gait and mobility: Secondary | ICD-10-CM

## 2020-02-09 DIAGNOSIS — R278 Other lack of coordination: Secondary | ICD-10-CM

## 2020-02-09 DIAGNOSIS — M21372 Foot drop, left foot: Secondary | ICD-10-CM

## 2020-02-09 DIAGNOSIS — R2681 Unsteadiness on feet: Secondary | ICD-10-CM

## 2020-02-09 DIAGNOSIS — M21371 Foot drop, right foot: Secondary | ICD-10-CM

## 2020-02-09 NOTE — Therapy (Signed)
[facility] [address] [place], [place], [address] Phone: [phone]   Fax:  [phone]  Physical Therapy Treatment  Patient Details  Name: Lucas Cunningham MRN: [id number] Date of Birth: 21-Mar-2000 Referring Provider (PT): Lance Morin Torric NP   Encounter Date: 02/09/2020   PT End of Session - 02/09/20 1501    Visit Number 6    Number of Visits 17    Authorization Type [employer]    Authorization Time Period 16 visits 12/8-2/02/22    Authorization - Visit Number 5    Authorization - Number of Visits 16    PT Start Time 1356    PT Stop Time 1445    PT Time Calculation (min) 49 min    Equipment Utilized During Treatment Gait belt    Activity Tolerance Patient tolerated treatment well;Patient limited by fatigue    Behavior During Therapy WFL for tasks assessed/performed           Past Medical History:  Diagnosis Date   Diabetes mellitus without complication (HCC)    type 1    History reviewed. No pertinent surgical history.  There were no vitals filed for this visit.   Subjective Assessment - 02/09/20 1355    Subjective Pt reports no changes, no falls.  Overall doing okay.    Limitations Walking;Standing;House hold activities    Patient Stated Goals "I want to be able to bring in groceries and help around the house."    Currently in Pain? No/denies                             [facility] Adult PT Treatment/Exercise - 02/09/20 1439      Transfers   Transfers Sit to Stand;Stand to Sit    Sit to Stand 6: Modified independent (Device/Increase time)    Stand to Sit 6: Modified independent (Device/Increase time)    Comments Continues to demonstrate dec strength in B quads with heavy reliance on UEs      Ambulation/Gait   Ambulation/Gait Yes    Ambulation/Gait Assistance 5: Supervision    Ambulation/Gait Assistance Details Trialed use of clinic AFOs during session for  better foot clearance and support at knee to reduce B hyperextension.  First trialed Vita Erm PLS brace bilaterally with great foot clearance, but still with hyperextension.  Not as much and not forceful.  Pt did report that it makes him feel "straight up" and slightly less stable but not to the point of buckling.  Added larger B heel wedges and this seemed to help even more.  Pt reports that it "feels good."  Then trialed the anterior ground reaction type Thuasne brace without heel wedges with pt reporting more forced hyperextension (knees are forced back more).  Did add heel wedge to one side and this increased pain and pt did not like.  PT to call [employer] and have them come to future visit.    Ambulation Distance (Feet) 115 Feet   x 3 reps, 15' x 1 rep with anterior brace/heel wedge   Assistive device Rolling walker   see gait comments for AFO   Gait Pattern Step-through pattern;Decreased stride length;Decreased dorsiflexion - right;Decreased dorsiflexion - left;Right steppage;Left steppage;Wide base of support;Trunk flexed    Ambulation Surface Level;Indoor    Gait velocity 1.97 ft/sec with B PLS AFOs (vs 1.56 ft/sec baseline without AFOs)      Self-Care   Self-Care Other Self-Care Comments  Other Self-Care Comments  Provided education to pt and grandmother regarding shoes with future AFOs.  His current shoes are very difficult for him to don properly due to tounge placement and he is unable to lace them by himself due to UE deficits.  Discussed either getting a different pair of lace up shoes and we could trial elastic laces in clinic to see if this would work vs getting a new pair of velcro shoes from [employer] (at least a 1/2 size bigger to accommodate brace).  Pt and grandmother verbalized understanding.      Knee/Hip Exercises: Aerobic   Stepper Seated scifit x 6 mins with BUEs/LEs at level 3 resistance with cues to maintain rpms in the 60s to 70s.  Pt did very well with this.  Moderately  fatigued following task.                    PT Short Term Goals - 01/15/20 1417      PT SHORT TERM GOAL #1   Title Pt will be IND with initial HEP in order to indicate improved function and balance (Target Date: 02/14/2020)    Baseline dependent    Time 4    Period Weeks    Status New    Target Date 02/14/20      PT SHORT TERM GOAL #2   Title Will assess B AFOs and get [employer] involved as needed to decrease fall risk and improve efficiency of gait.    Baseline did not have time to assess    Time 4    Period Weeks    Status New      PT SHORT TERM GOAL #3   Title Pt will improve gait speed to >/= 2.16 ft/sec w/ LRAD and B AFO as needed to indicate dec fall risk.    Baseline 1.56 ft/sec with RW, no AFOs    Time 4    Period Weeks    Status New      PT SHORT TERM GOAL #4   Title Pt will improve 5TSS to </=18 secs with BUE support in order to indicate improved strength.    Baseline 21.75 secs with BUE support    Time 4    Period Weeks    Status New      PT SHORT TERM GOAL #5   Title Will assess TUG and update STG/LTG as able    Baseline did not have time to assess    Time 4    Period Weeks    Status New      Additional Short Term Goals   Additional Short Term Goals Yes      PT SHORT TERM GOAL #6   Title Pt will be able to stand statically without support x 15 secs in order to indicate improved balance and strength.    Baseline 2-3 secs only without LOB posteriorly    Time 4    Period Weeks    Status New             PT Long Term Goals - 01/15/20 1424      PT LONG TERM GOAL #1   Title Pt will be IND with final HEP in order to indicate improved functional mobility and dec fall risk.  (Target Date: 03/15/20)    Baseline dependent    Time 8    Period Weeks    Status New    Target Date 03/15/20      PT LONG TERM GOAL #  2   Title Pt will improve gait speed to >/=2.62 ft/sec with LRAD and B AFOs as needed in order to indicate safe community ambulation.     Baseline 1.56 ft/sec withRW and no AFOs    Time 8    Period Weeks    Status New      PT LONG TERM GOAL #3   Title Pt will improve 5TSS to </=16 secs with single UE support only in order to indicate improved functional strength.    Baseline 21.75 secs with BUE support    Time 8    Period Weeks    Status New      PT LONG TERM GOAL #4   Title Pt will ambulate x 500' over unlevel paved surfaces w/ LRAD and B AFOs as needed at mod I level in order to indicate safe community mobility.    Baseline unable to assess, but is fatigued with 100' indoor gait with RW    Time 8    Period Weeks    Status New      PT LONG TERM GOAL #5   Title Pt will perform dynamic standing balance with intermittent UE support x 5 mins in order to perform ADLs safely at home.    Baseline Needs BUE support as this time, unable to stand statically without support    Time 8    Period Weeks    Status New                 Plan - 02/09/20 1502    Clinical Impression Statement Skilled session focused on assessment of B AFOs for improved foot clearance and knee support to reduce hyperextension in stance.  Thuasne Townsend PLS braces with larger heel wedges worked the best during session.  Feel that he will likley need heavy support due to weakness/deficits.  He may even need custom braces.  Chris From [facility] to be at session on 1/5 to formally assess.    Personal Factors and Comorbidities Comorbidity 3+;Time since onset of injury/illness/exacerbation    Comorbidities see above    Examination-Activity Limitations Bathing;Dressing;Locomotion Level;Transfers;Squat;Stairs;Stand    Examination-Participation Restrictions Community Activity;Driving;School;Yard Work   Associate Professorschool   Stability/Clinical Decision Making Evolving/Moderate complexity    Rehab Potential Good    PT Frequency 2x / week    PT Duration 8 weeks    PT Treatment/Interventions ADLs/Self Care Home Management;DME Instruction;Gait training;Stair  training;Functional mobility training;Therapeutic activities;Therapeutic exercise;Balance training;Neuromuscular re-education;Patient/family education;Orthotic Fit/Training;Passive range of motion;Vestibular    PT Next Visit Plan Check STGs (maybe do with braces to show improvement with them) Continue to work with Thuasne PLS braces with large heel wedge.  Try elastic laces if he brings new shoes or could try with his current shoes.  Continue BLE strength, activity tolerance and balance.    Consulted and Agree with Plan of Care Patient           Patient will benefit from skilled therapeutic intervention in order to improve the following deficits and impairments:  Abnormal gait,Decreased activity tolerance,Decreased balance,Decreased endurance,Decreased knowledge of precautions,Decreased knowledge of use of DME,Decreased mobility,Decreased safety awareness,Decreased strength,Difficulty walking,Impaired perceived functional ability,Impaired sensation,Impaired UE functional use,Postural dysfunction  Visit Diagnosis: Unsteadiness on feet  Other lack of coordination  Muscle weakness (generalized)  Other abnormalities of gait and mobility  Foot drop, left  Foot drop, right     Problem List Patient Active Problem List   Diagnosis Date Noted   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) 12/03/2019  DKA, type 1 (HCC) 10/23/2019   DKA (diabetic ketoacidosis) (HCC) 10/21/2019   COVID-19 virus infection 10/21/2019   Diabetic polyneuropathy associated with type 1 diabetes mellitus (HCC) 02/26/2019   Leg weakness, bilateral 02/25/2019   Postprandial bloating 02/25/2019   Sensory problems with limbs 02/25/2019   Ketonuria 11/04/2018   Diabetes (HCC) 04/23/2018   Uncontrolled type 1 diabetes mellitus with hyperglycemia (HCC) 05/24/2017   Morbid childhood obesity with BMI greater than 99th percentile for age [facility]) 05/24/2017   Insulin resistance  05/24/2017   Acanthosis 05/24/2017    Harriet Butte, PT, MPT [facility] [address] [place], [place], [address] Phone: [phone]   Fax:  [phone] 02/09/20, 3:07 PM  Name: Lucas Cunningham MRN: [id number] Date of Birth: 10-31-2000

## 2020-02-09 NOTE — Progress Notes (Signed)
Endocrinology provider: Dr. Quincy Sheehan (upcoming appt 03/24/2020 10:30 am)  Dietitian: [name], RD (no upcoming appt) -Previous appts on 06/10/18, 02/25/19, and 12/22/2019  Behavioral health specialist: Dr. Huntley Dec (upcoming appt 03/24/2020 1:00 pm) -No prior appt  Patient referred by Dr. Fransico Michael for diabetes education. PMH is significant for T1DM with severe insulin resistance, acanthosis, diabetic polyneuropathy, morbid obesity, prior hx of COVID-19 infection 2019/11/24), and depression. Patient was lost to follow up from 02/25/19 to 12/03/19. At prior appt with Dr. Fransico Michael on 12/03/19, multiple issues were discussed. Patient states Spring/Summer 2021 he lost focus on his DM management due to how his mother passed away (now living with his grandmother). He has been guessing at his Novolog doses (not following the sliding scale plan provided by Dr Vanessa [place] on 11/04/18). He has SEVERE neuropathy; he can't stand up from a sitting position without using his arms for support and he also experiences numbness and loss of strength in the palm of his right hand. He was previously followed by Dr. Devonne Doughty, but has "aged out" of that practice. He was a no show for two NP appointments at [facility], once in 10-24-22 when his mother died and once in 11/24/22 when hew was admitted with DKA. Dr. Fransico Michael asked the grandmother to call [facility] and obtain a new appointment for him. Patient did mention though at prior appt with Dr Fransico Michael that he wants help in managing his DM. Dr. Fransico Michael advised patient to continue Tresiba 100 units daily (200 units/mL pen) and to re-start Novolog sliding scale + fixed dose of Novolog 10 units three times daily prior to meals. Dr. Fransico Michael would like to transition patient to a 2 component plan (carb + correction factor) for rapid acting insulin in the future.  At prior appt on 02/05/20, patient was having issues with medication adherence.  We brainstormed a plan to increase  adherence - grandmother gives [name] his metformin when she takes metformin. Patient also was able to show he is now able to move his right arm. Patient's Dexcom Clarity report showed 92% very high and 8% high. He did not want to increase his insulin doses as he wanted to work on his medication adherence first. Insulin doses were continued, but metformin was titrated up to metformin XR 500 mg once daily after breakfast and 1000 mg once daily after dinner.  Patient presents for follow up appt independently (grandmother in the lobby). Patient has not been doing well. His uncle recently committed suicide and he has lost 10 lbs. [name] found out about his uncle's death on Christmas. Patient states the last week or so of December he stopped taking his medications. He just would lay in bed. He has not worn his Dexcom in 2 days, however, he states he is motivated to take better care of himself. He has brought his Dexcom supplies today to restart Dexcom. Patient forgot to bring his Dexcom receiver today, however, BG was 359 mg/dL and had large ketones in urine.   School: not in school right now; planning to re-enrol in [employer] (he is unsure of time of re-enroll)  Insurance Coverage: Managed Medicaid [facility] plan; ID # [id number])  Diabetes Diagnosis: 04/23/18  Family History: T2DM (maternal grandmother, paternal grandfather); T1DM (paternal grandmother); DM (uncle)  Patient-Reported BG Readings:  -Patient denies hypoglycemic events. --Treats hypoglycemic episode with candy (2 starbusts) --Hypoglycemic symptoms: "cold, calm, peaceful"  Preferred Pharmacy [facility] [phone] - [place], [place] - [address]  [address] [place] [address]  Phone:  [phone] Fax:  [phone]  DEA #:  [id number]  Medication Adherence -Patient denies adherence with [name] and Afrezza/Novolog -Current diabetes medications include: Tresiba 50 units daily  (200 units/mL pen), Afrezza/HumalogU200, metformin XR 500 mg daily after breakfast and 1000 mg daily after dinner  --Afrezza: breakfast: 2 of the 8 unit green cartridges (total = 16 units) and lunch: 2 of the 12 unit yellow cartridges (total = 24 units). --If he forgets to take Afrezza out of the fridge he was instructed to administer Humalog U200; breakfast: 20 units and lunch: 40 units. -Prior diabetes medications include: none  Injection Sites (no changes since prior appt on 02/05/20) -Patient-reports injection sites are right arm, abdomen, right leg --Patient reports independently injecting DM medications. --Patient reports rotating injection sites  Diet (changes since prior appt on 02/05/20) Patient reported dietary habits (gets $90 in food stamps each month) Eats 2 meals/day and 1 snacks/day Breakfast (8:30-9:00 am): 3 eggs with 2 pieces of whole wheat nature's own brand toast sometimes [place] breast or bologna Lunch ("anytime"; 12-1PM or sometimes ~3-4PM): pizza Dinner (10-11pm): skips Snacks: no snacks  Drinks: coffee, water (24 bottle case in 2-3 days)  Exercise (no changes since prior appt on 02/05/20) Patient-reported exercise habits: walks around house (10 min at a time, 2x daily), sometimes walk around porch; sometimes leg kicks in bed everyday for 5 min, doing exercises recommended by PT (using band, marching) does for 5 minutes each day   Monitoring: Patient reports 2 episodes of nocturia (nighttime urination) each night. Patient reports neuropathy (nerve pain) in his right hand. Patient denies visual changes. (Followed by ophthalmology) Patient denies self foot exams.  -Patient reports socks/slippers in the house and reports shoes outside.  -Patient reports monitoring for open wounds/cuts on her feet; no open cuts/wounds.   Dexcom Clarity Report Patient forgot to bring his Dexcom receiver today   There were no vitals filed for this visit.  Lab Results  Component  Value Date   HGBA1C >14 01/26/2020   HGBA1C 14.4 (H) 10/21/2019   HGBA1C 14.0 (A) 02/25/2019   HGBA1C >14 02/25/2019    Lab Results  Component Value Date   CPEPTIDE 3.8 04/23/2018       Assessment:  Patient's goal with DM management: "Get diabetes controlled as independently as possible"  Medication Management/monitoring/behavioral health specialist referral - DM control was improving, however, patient has had a significant life stressor. He had not been taking his insulin consistently that last week of December. BG likely have been > 300 mg/dL consistently considering BG upon office visit was 359 mg/dL and he had large ketones. He has not had hypoglycemia. BG likely have been so high considering medication nonadherence. Will keep all medication doses the same today and have patient focus on adherence. Placed referral to Dr. Huntley Dec considering patient's recent life stressors. Assisted patient with changing Dexcom sensors. Applied new sensor (sensor code [id number]) and reused transmitter (transmitter code [id number]). He did not have dexcom receiver so I was unable to synch sensor/transmitter/receiver. I provided patient a sample dexcom sensor/transmitter to use in case he ran into any issues. He will go home to synch his Dexcom G6 CGM. He will restart wearing Dexcom G6 CGM on a consistent basis.   Hyperglycemia with ketonuria - Patient currently should be taking TDD of insulin of 110 units. Based on this his ISF is 16. Will set his target BG to 150. Printed out a correction  dose table for patient to follow regarding insulin dosage guidance. Thoroughly discussed hyperglycemia management protocol - he will check BG readings every 2.5-3 hours and administer insulin based on BG reading, check urine ketones (patient confirms he has ketone strips) at least once an hour, and drink 1-2 bottles of water every hour. Patient was provided 3 cups of water during 1 hour appt. Before he left I administered 13 units of  Humalog U100 to patient. If patient gets nauseous, I have sent prescription pharmacy for Zofran ODT 8 mg every 8 hours prn. Discussed if he has uncontrollably vomiting / trouble breathing / confusion then he needs to go to ED. Also, went out to lobby to discuss this plan with his grandmother who verbalized understanding. Will f/u with pt in office in 1 week.    Plan: 1. Medications:  a. Continue Tresiba 50 units daily b. Continue Afrezza to fixed dose regimen i. Breakfast: 2 of the 8 unit green cartridges (total = 16 units) ii. Lunch: 2 of the 12 unit yellow cartridges (total = 24 units) c. If he forgets to take Afrezza out of the fridge then administer Humalog U200  i. Breakfast: 20 units ii. Lunch: 40 units d. Continue metformin XR 500 mg once daily after breakfast and 1000 mg once daily after lunch/dinner 2. Hyperglycemia with ketonuria a. Printed out a correction dose table for patient to follow regarding insulin dosage guidance. Thoroughly discussed hyperglycemia management protocol - he will check BG readings every 2.5-3 hours and administer insulin based on BG reading, check urine ketones (patient confirms he has ketone strips) at least once an hour, and drink 1-2 bottles of water every hour. Patient was provided 3 cups of water during 1 hour appt. Before he left I administered 13 units of Humalog U100 to patient. If patient gets nauseous, I have sent prescription pharmacy for Zofran ODT 8 mg every 8 hours prn. Discussed if he has uncontrollably vomiting / trouble breathing / confusion then he needs to go to ED. Also, went out to lobby to discuss this plan with his grandmother who verbalized understanding.  3. Mental Health a. Referral to Dr. Huntley Dec  4. Monitoring a. Continue Dexcom G6 CGM b. Provided Dexcom sensor/transmitter sample c. Brinton Brandel has a diagnosis of diabetes, checks blood glucose readings > 4x per day, treats with > 3 insulin injections, and requires frequent  adjustments to insulin regimen. This patient will be seen every six months, minimally, to assess adherence to their CGM regimen and diabetes treatment plan 5. Follow Up: 1 week  This appointment required 60 minutes of patient care (this includes precharting, chart review, review of results, face-to-face care, etc.).  Thank you for involving clinical pharmacist/diabetes educator to assist in providing this patient's care.  Zachery Conch, PharmD, CPP, CDCES

## 2020-02-11 ENCOUNTER — Ambulatory Visit: Payer: Medicaid Other | Admitting: Podiatry

## 2020-02-16 ENCOUNTER — Telehealth (INDEPENDENT_AMBULATORY_CARE_PROVIDER_SITE_OTHER): Payer: Self-pay | Admitting: Pharmacist

## 2020-02-16 ENCOUNTER — Ambulatory Visit: Payer: Medicaid Other | Admitting: Occupational Therapy

## 2020-02-16 ENCOUNTER — Ambulatory Visit: Payer: Medicaid Other | Admitting: Rehabilitation

## 2020-02-16 NOTE — Telephone Encounter (Signed)
Called patient.  He is out of sensors and was unable to obtain from the pharmacy (he is not sure why as he did not attempt to obtain dexcom supplies himself). Will supply patinet with dexcom sample to pick up tomorrow. Will f/u with pharmacy tomorrow to determine issue.  Thank you for involving clinical pharmacist/diabetes educator to assist in providing this patient's care.   Zachery Conch, PharmD, CPP, CDCES

## 2020-02-16 NOTE — Telephone Encounter (Signed)
Who's calling (name and relationship to patient) : Diamantina Monks  Best contact number: [phone]  Provider they see: Dr. Ladona Ridgel  Reason for call: Grandmother states that [name] is missing a piece of his dexcom. Please call to discuss. They request to speak with Dr. Ladona Ridgel  Call ID:      PRESCRIPTION REFILL ONLY  Name of prescription:  Pharmacy:

## 2020-02-17 ENCOUNTER — Telehealth (INDEPENDENT_AMBULATORY_CARE_PROVIDER_SITE_OTHER): Payer: Self-pay | Admitting: Pharmacist

## 2020-02-17 NOTE — Telephone Encounter (Signed)
Contacted pharmacy.  Pharmacist states that Dexcom sensors were ready, however, patient never picked them up so he put them back on the shelf. He will fill the prescription again and patient will be able to pick up Dexcom sensors today.  I asked him to check on Dexcom transmitters as well - those are unable to be filled until 03/09/20.   Called patient on 02/17/2020 at 9:28 AM and left HIPAA-compliant VM with instructions to call [facility] back. Plan to discuss information above.   Thank you for involving pharmacy/diabetes educator to assist in providing this patient's care.   Zachery Conch, PharmD, CPP, CDCES

## 2020-02-18 ENCOUNTER — Encounter: Payer: Self-pay | Admitting: Neurology

## 2020-02-18 ENCOUNTER — Ambulatory Visit: Payer: Medicaid Other | Admitting: Occupational Therapy

## 2020-02-18 ENCOUNTER — Telehealth (INDEPENDENT_AMBULATORY_CARE_PROVIDER_SITE_OTHER): Payer: Self-pay

## 2020-02-18 ENCOUNTER — Ambulatory Visit: Payer: Medicaid Other | Admitting: Physical Therapy

## 2020-02-18 ENCOUNTER — Ambulatory Visit (INDEPENDENT_AMBULATORY_CARE_PROVIDER_SITE_OTHER): Payer: Medicaid Other | Admitting: Neurology

## 2020-02-18 VITALS — BP 120/88 | HR 75 | Ht 71.0 in | Wt 284.0 lb

## 2020-02-18 DIAGNOSIS — R531 Weakness: Secondary | ICD-10-CM | POA: Diagnosis not present

## 2020-02-18 DIAGNOSIS — G6181 Chronic inflammatory demyelinating polyneuritis: Secondary | ICD-10-CM

## 2020-02-18 DIAGNOSIS — R29898 Other symptoms and signs involving the musculoskeletal system: Secondary | ICD-10-CM | POA: Diagnosis not present

## 2020-02-18 DIAGNOSIS — M21371 Foot drop, right foot: Secondary | ICD-10-CM | POA: Diagnosis not present

## 2020-02-18 DIAGNOSIS — G1229 Other motor neuron disease: Secondary | ICD-10-CM

## 2020-02-18 DIAGNOSIS — M21372 Foot drop, left foot: Secondary | ICD-10-CM | POA: Diagnosis not present

## 2020-02-18 DIAGNOSIS — E1042 Type 1 diabetes mellitus with diabetic polyneuropathy: Secondary | ICD-10-CM

## 2020-02-18 DIAGNOSIS — M21331 Wrist drop, right wrist: Secondary | ICD-10-CM

## 2020-02-18 DIAGNOSIS — G6289 Other specified polyneuropathies: Secondary | ICD-10-CM

## 2020-02-18 NOTE — Patient Instructions (Addendum)
MRI lumbar spine. If negative then we will proceed to MRI brain and cervical spine.  EMG/NCS Blood work today   Marine scientist is a test to check how well your muscles and nerves are working. This procedure includes the combined use of electromyogram (EMG) and nerve conduction study (NCS). EMG is used to look for muscular disorders. NCS, which is also called electroneurogram, measures how well your nerves are controlling your muscles. The procedures are usually done together to check if your muscles and nerves are healthy. If the results of the tests are abnormal, this may indicate disease or injury, such as a neuromuscular disease or peripheral nerve damage. Tell a health care provider about:  Any allergies you have.  All medicines you are taking, including vitamins, herbs, eye drops, creams, and over-the-counter medicines.  Any problems you or family members have had with anesthetic medicines.  Any blood disorders you have.  Any surgeries you have had.  Any medical conditions you have.  If you have a pacemaker.  Whether you are pregnant or may be pregnant. What are the risks? Generally, this is a safe procedure. However, problems may occur, including:  Infection where the electrodes were inserted.  Bleeding. What happens before the procedure? Medicines Ask your health care provider about:  Changing or stopping your regular medicines. This is especially important if you are taking diabetes medicines or blood thinners.  Taking medicines such as aspirin and ibuprofen. These medicines can thin your blood. Do not take these medicines unless your health care provider tells you to take them.  Taking over-the-counter medicines, vitamins, herbs, and supplements. General instructions  Your health care provider may ask you to avoid: ? Beverages that have caffeine, such as coffee and tea. ? Any products that contain nicotine or tobacco. These products  include cigarettes, e-cigarettes, and chewing tobacco. If you need help quitting, ask your health care provider.  Do not use lotions or creams on the same day that you will be having the procedure. What happens during the procedure? For EMG   Your health care provider will ask you to stay in a position so that he or she can access the muscle that will be studied. You may be standing, sitting, or lying down.  You may be given a medicine that numbs the area (local anesthetic).  A very thin needle that has an electrode will be inserted into your muscle.  Another small electrode will be placed on your skin near the muscle.  Your health care provider will ask you to continue to remain still.  The electrodes will send a signal that tells about the electrical activity of your muscles. You may see this on a monitor or hear it in the room.  After your muscles have been studied at rest, your health care provider will ask you to contract or flex your muscles. The electrodes will send a signal that tells about the electrical activity of your muscles.  Your health care provider will remove the electrodes and the electrode needles when the procedure is finished. The procedure may vary among health care providers and hospitals. For NCS   An electrode that records your nerve activity (recording electrode) will be placed on your skin by the muscle that is being studied.  An electrode that is used as a reference (reference electrode) will be placed near the recording electrode.  A paste or gel will be applied to your skin between the recording electrode and the reference electrode.  Your nerve will  be stimulated with a mild shock. Your health care provider will measure how much time it takes for your muscle to react.  Your health care provider will remove the electrodes and the gel when the procedure is finished. The procedure may vary among health care providers and hospitals. What happens after  the procedure?  It is up to you to get the results of your procedure. Ask your health care provider, or the department that is doing the procedure, when your results will be ready.  Your health care provider may: ? Give you medicines for any pain. ? Monitor the insertion sites to make sure that bleeding stops. Summary  Electromyoneurogram is a test to check how well your muscles and nerves are working.  If the results of the tests are abnormal, this may indicate disease or injury.  This is a safe procedure. However, problems may occur, such as bleeding and infection.  Your health care provider will do two tests to complete this procedure. One checks your muscles (EMG) and another checks your nerves (NCS).  It is up to you to get the results of your procedure. Ask your health care provider, or the department that is doing the procedure, when your results will be ready. This information is not intended to replace advice given to you by your health care provider. Make sure you discuss any questions you have with your health care provider. Document Revised: 10/16/2017 Document Reviewed: 09/28/2017 Elsevier Patient Education  [place].   Peripheral Neuropathy Peripheral neuropathy is a type of nerve damage. It affects nerves that carry signals between the spinal cord and the arms, legs, and the rest of the body (peripheral nerves). It does not affect nerves in the spinal cord or brain. In peripheral neuropathy, one nerve or a group of nerves may be damaged. Peripheral neuropathy is a broad category that includes many specific nerve disorders, like diabetic neuropathy, hereditary neuropathy, and carpal tunnel syndrome. What are the causes? This condition may be caused by:  Diabetes. This is the most common cause of peripheral neuropathy.  Nerve injury.  Pressure or stress on a nerve that lasts a long time.  Lack (deficiency) of B vitamins. This can result from alcoholism, poor  diet, or a restricted diet.  Infections.  Autoimmune diseases, such as rheumatoid arthritis and systemic lupus erythematosus.  Nerve diseases that are passed from parent to child (inherited).  Some medicines, such as cancer medicines (chemotherapy).  Poisonous (toxic) substances, such as lead and mercury.  Too little blood flowing to the legs.  Kidney disease.  Thyroid disease. In some cases, the cause of this condition is not known. What are the signs or symptoms? Symptoms of this condition depend on which of your nerves is damaged. Common symptoms include:  Loss of feeling (numbness) in the feet, hands, or both.  Tingling in the feet, hands, or both.  Burning pain.  Very sensitive skin.  Weakness.  Not being able to move a part of the body (paralysis).  Muscle twitching.  Clumsiness or poor coordination.  Loss of balance.  Not being able to control your bladder.  Feeling dizzy.  Sexual problems. How is this diagnosed? Diagnosing and finding the cause of peripheral neuropathy can be difficult. Your health care provider will take your medical history and do a physical exam. A neurological exam will also be done. This involves checking things that are affected by your brain, spinal cord, and nerves (nervous system). For example, your health care provider will  check your reflexes, how you move, and what you can feel. You may have other tests, such as:  Blood tests.  Electromyogram (EMG) and nerve conduction tests. These tests check nerve function and how well the nerves are controlling the muscles.  Imaging tests, such as CT scans or MRI to rule out other causes of your symptoms.  Removing a small piece of nerve to be examined in a lab (nerve biopsy). This is rare.  Removing and examining a small amount of the fluid that surrounds the brain and spinal cord (lumbar puncture). This is rare. How is this treated? Treatment for this condition may  involve:  Treating the underlying cause of the neuropathy, such as diabetes, kidney disease, or vitamin deficiencies.  Stopping medicines that can cause neuropathy, such as chemotherapy.  Medicine to relieve pain. Medicines may include: ? Prescription or over-the-counter pain medicine. ? Antiseizure medicine. ? Antidepressants. ? Pain-relieving patches that are applied to painful areas of skin.  Surgery to relieve pressure on a nerve or to destroy a nerve that is causing pain.  Physical therapy to help improve movement and balance.  Devices to help you move around (assistive devices). Follow these instructions at home: Medicines  Take over-the-counter and prescription medicines only as told by your health care provider. Do not take any other medicines without first asking your health care provider.  Do not drive or use heavy machinery while taking prescription pain medicine. Lifestyle   Do not use any products that contain nicotine or tobacco, such as cigarettes and e-cigarettes. Smoking keeps blood from reaching damaged nerves. If you need help quitting, ask your health care provider.  Avoid or limit alcohol. Too much alcohol can cause a vitamin B deficiency, and vitamin B is needed for healthy nerves.  Eat a healthy diet. This includes: ? Eating foods that are high in fiber, such as fresh fruits and vegetables, whole grains, and beans. ? Limiting foods that are high in fat and processed sugars, such as fried or sweet foods. General instructions   If you have diabetes, work closely with your health care provider to keep your blood sugar under control.  If you have numbness in your feet: ? Check every day for signs of injury or infection. Watch for redness, warmth, and swelling. ? Wear padded socks and comfortable shoes. These help protect your feet.  Develop a good support system. Living with peripheral neuropathy can be stressful. Consider talking with a mental health  specialist or joining a support group.  Use assistive devices and attend physical therapy as told by your health care provider. This may include using a walker or a cane.  Keep all follow-up visits as told by your health care provider. This is important. Contact a health care provider if:  You have new signs or symptoms of peripheral neuropathy.  You are struggling emotionally from dealing with peripheral neuropathy.  Your pain is not well-controlled. Get help right away if:  You have an injury or infection that is not healing normally.  You develop new weakness in an arm or leg.  You fall frequently. Summary  Peripheral neuropathy is when the nerves in the arms, or legs are damaged, resulting in numbness, weakness, or pain.  There are many causes of peripheral neuropathy, including diabetes, pinched nerves, vitamin deficiencies, autoimmune disease, and hereditary conditions.  Diagnosing and finding the cause of peripheral neuropathy can be difficult. Your health care provider will take your medical history, do a physical exam, and do tests,  including blood tests and nerve function tests.  Treatment involves treating the underlying cause of the neuropathy and taking medicines to help control pain. Physical therapy and assistive devices may also help. This information is not intended to replace advice given to you by your health care provider. Make sure you discuss any questions you have with your health care provider. Document Revised: 01/12/2017 Document Reviewed: 04/10/2016 Elsevier Patient Education  2020 [employer].

## 2020-02-18 NOTE — Progress Notes (Addendum)
[facility]    Provider:  Dr Lucia Gaskins Requesting Provider: Keturah Shavers, MD Primary Care Provider:  Jonette Pesa, NP  CC:  Foot drop, right arm wrist drop  HPI:  Lucas Cunningham is a 20 y.o. male here as requested by Keturah Shavers, MD for diabetic neuropathy.  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  I reviewed his notes from pediatrics back through 2002 and they are significant for: Multiple admissions for uncontrolled diabetes type 1 and DKA, diabetic neuropathy with leg weakness, sensory problems with the limbs, on gabapentin, last saw neurology in June 2021 and was referred to adult neurologist to manage him, now walks with a walker, mother died in Aug 16, 2021he lives with his grandmother.  I reviewed epic and "care everywhere", I did not see any notes from neurology.  Examination from 2021 October showed obesity, standing with the support of a walker, he can get up from chairs, however alert and bright, normal head face eyes ears, neck visibly normal but does have a thyroid enlargement, lungs are clear to auscultation, heart rates normal without murmurs, abdomen normal, arms normal, no obvious tremor, leg muscles appear normal for age, feet are normally formed, dorsalis pedis pulses 1+, strength was difficult to assess overall, sensation to touch normal in the legs but decreased in both feet.  He has seen child neurology in the past. Just a few months ago. He can't remember the name. Poor historian. Unclear if he even saw neurology. He is here because his legs are very weak, sensory changes in the legs, been ongoing for about 2 years, when it first started it was "bad", started acutely he says he couldn't stand  up. Then he improved. He couldn't step on a curve back then but now he can. Happened in both legs,symmetrical,feels "weird" to touch his legs.  Symptoms started in the right hand a few months ago, his wrist became weak and he can;t lift his hand, after hitting the house, he has numbness in the right hand, tingly, more in digits 1-2 of the right hand. No back pain, no neck pain. Using a walker for since 2020. Sometimes painful in the right hand but not in the legs or feet. Legs are stable. The right   Reviewed notes, labs and imaging from outside physicians, which showed:   10/25/2019: hgba1c 14.4  Review of Systems: Patient complains of symptoms per HPI as well as the following symptoms: weakness. Pertinent negatives and positives per HPI. All others negative.   Social History   Socioeconomic History  . Marital status: Single    Spouse name: Not on file  . Number of children: Not on file  . Years of education: Not on file  . Highest education level: Not on file  Occupational History  . Not on file  Tobacco Use  . Smoking status: Passive Smoke Exposure - Never Smoker  . Smokeless tobacco: Never Used  Substance and Sexual Activity  . Alcohol use: Not Currently  . Drug use: Not Currently  . Sexual activity: Not Currently  Other Topics Concern  . Not on file  Social History Narrative   Lives with his grandmother. He graduated HS 2021   Right handed   Caffeine: 2 cups/day   Social Determinants of Health   Financial Resource Strain: Not on  file  Food Insecurity: Not on file  Transportation Needs: Not on file  Physical Activity: Not on file  Stress: Not on file  Social Connections: Not on file  Intimate Partner Violence: Not on file    Family History  Problem Relation Age of Onset  . Heart disease Mother   . Hypertension Mother   . Diabetes Maternal Grandmother   . Diabetes Maternal Grandfather   . Diabetes Paternal Grandmother   . Migraines Neg Hx   . Seizures Neg Hx    . Autism Neg Hx   . ADD / ADHD Neg Hx   . Anxiety disorder Neg Hx   . Depression Neg Hx   . Bipolar disorder Neg Hx   . Schizophrenia Neg Hx     Past Medical History:  Diagnosis Date  . Diabetes mellitus without complication ([place])    type 1    Patient Active Problem List   Diagnosis Date Noted  . Autonomic neuropathy associated with type 1 diabetes mellitus ([place]) 12/03/2019  . DKA, type 1 ([place]) 10/23/2019  . DKA (diabetic ketoacidosis) ([place]) 10/21/2019  . COVID-19 virus infection 10/21/2019  . Diabetic polyneuropathy associated with type 1 diabetes mellitus ([place]) 02/26/2019  . Leg weakness, bilateral 02/25/2019  . Postprandial bloating 02/25/2019  . Sensory problems with limbs 02/25/2019  . Ketonuria 11/04/2018  . Diabetes ([place]) 04/23/2018  . Uncontrolled type 1 diabetes mellitus with hyperglycemia ([place]) 05/24/2017  . Morbid childhood obesity with BMI greater than 99th percentile for age [facility]) 05/24/2017  . Insulin resistance 05/24/2017  . Acanthosis 05/24/2017    Past Surgical History:  Procedure Laterality Date  . NO PAST SURGERIES      Current Outpatient Medications  Medication Sig Dispense Refill  . Accu-Chek FastClix Lancets MISC 1 Device by Does not apply route as directed. Use with Fastclix lancet device to check blood sugar up to 8x daily. (diagnosis code E10.65) 300 each 11  . acetone, urine, test strip 1 strip by Does not apply route as needed for high blood sugar. Up to 8x daily if necessary 360 strip 11  . Continuous Blood Gluc Receiver (DEXCOM G6 RECEIVER) DEVI 1 Device by Does not apply route as directed. 1 each 2  . Continuous Blood Gluc Sensor (DEXCOM G6 SENSOR) MISC Inject 1 applicator into the skin as directed. (change sensor every 10 days) 3 each 11  . Continuous Blood Gluc Transmit (DEXCOM G6 TRANSMITTER) MISC Inject 1 Device into the skin as directed. (re-use up to 8x with each new sensor) 1 each 3  . gabapentin (NEURONTIN) 300 MG capsule Take 1  capsule (300 mg total) by mouth 2 (two) times daily. 60 capsule 5  . Glucagon (BAQSIMI TWO PACK) 3 MG/DOSE POWD Place 1 spray into the nose as directed. 2 each 3  . Glucose Blood (BLOOD GLUCOSE TEST STRIPS) STRP 1 Device by Other route as directed. To check blood sugar up to 8x per day 300 strip 11  . Insulin Degludec (TRESIBA FLEXTOUCH) 200 UNIT/ML SOPN 100 Units by Subconjunctival route daily. (Patient taking differently: Inject 100 Units into the skin at bedtime.) 60 mL 5  . insulin lispro (HUMALOG KWIKPEN) 200 UNIT/ML KwikPen Inject up to 100 units daily per provider instructions 30 mL 11  . Insulin Pen Needle (BD PEN NEEDLE NANO U/F) 32G X 4 MM MISC Inject up to 8 times per day. 250 each 6  . Insulin Regular Human (AFREZZA) 4 & 8 & 12 units POWD Inhale up to 96 units  daily 360 each 11  . ketoconazole (NIZORAL) 2 % cream Apply to fet daily. 30 g 6  . Lancets Misc. (ACCU-CHEK FASTCLIX LANCET) KIT 1 Device by Does not apply route as directed. Use with lancets to check blood sugar up to 8x daily.  (diagnosis code E10.65) (Patient taking differently: 1 Device by Does not apply route as directed. Use with lancets to check blood sugar up to 8x daily.  (diagnosis code E10.65)) 1 kit 3  . metFORMIN (GLUCOPHAGE-XR) 500 MG 24 hr tablet Take 1 tablet (500 mg total) by mouth in the morning and at bedtime. 60 tablet 11  . ondansetron (ZOFRAN ODT) 8 MG disintegrating tablet Take 1 tablet (8 mg total) by mouth every 8 (eight) hours as needed for nausea or vomiting. 20 tablet 6   No current facility-administered medications for this visit.    Allergies as of 02/18/2020  . (No Known Allergies)    Vitals: BP 120/88 (BP Location: Left Arm, Patient Position: Sitting)   Pulse 75   Ht $R'5\' 11"'IK$  (1.803 m)   Wt 284 lb (128.8 kg)   SpO2 96%   BMI 39.61 kg/m  Last Weight:  Wt Readings from Last 1 Encounters:  02/19/20 275 lb 6.4 oz (124.9 kg) (>99 %, Z= 2.77)*   * Growth percentiles are based on CDC (Boys,  2-20 Years) data.   Last Height:   Ht Readings from Last 1 Encounters:  02/18/20 $RemoveB'5\' 11"'VhnNMiTm$  (1.803 m) (70 %, Z= 0.51)*   * Growth percentiles are based on CDC (Boys, 2-20 Years) data.     Physical exam: Exam: Gen: NAD, conversant, well nourised, obese, well groomed                     CV: RRR, no MRG. No Carotid Bruits. No peripheral edema, warm, nontender Eyes: Conjunctivae clear without exudates or hemorrhage  Neuro: Detailed Neurologic Exam  Speech:    Speech is normal; fluent and spontaneous with normal comprehension.  Cognition:    The patient is oriented to person, place, and time;     recent and remote memory intact;     language fluent;     normal attention, concentration,     fund of knowledge Cranial Nerves:    The pupils are equal, round, and reactive to light. Attempted fundoscopy could not visualize.  Visual fields are full to finger confrontation. Extraocular movements are intact. Trigeminal sensation is intact and the muscles of mastication are normal. The face is symmetric. The palate elevates in the midline. Hearing intact. Voice is normal. Shoulder shrug is normal. The tongue has normal motion without fasciculations.   Coordination:    No dysmetria or ataxia  Gait:    Steppage gait  Motor Observation:    No asymmetry, no atrophy, and no involuntary movements noted. Tone:    Normal muscle tone.    Posture:    Posture is normal. normal erect    Strength: deltoid intact strength, biceps 4/5, triceps 3+-4/5, right wrist drop,     Strength is V/V in the upper and lower limbs. Hip flexion 4+/5. Leg flex.extension 5/5. 0/5 DF/PF feet.      Sensation: intact to LT, pinprick, temp, absent vibration distally in the feet at great toes.      Reflex Exam:  DTR's: absent lowers, trace biceps and triceps, 1+ brachiradialis.  Toes:    The toes are equiv bilaterally.   Clonus:    Clonus is absent.    Assessment/Plan:  This is a 20 year old male with  uncontrolled type 1 diabetes who is here for diabetic polyneuropathy.  Although diabetes may be a factor (hemoglobin A1c greater than 14) he has a very interesting presentation with bilateral distal foot drop and now right wrist drop and weakness of the right arm.  His sensation distally is good to pinprick and temperature but absent to vibration.  I wonder if this is CIDP as he reports the lower extremity weakness was acute in onset however he is a poor historian, his mother passed away recently and is here with his grandmother who does not have much to add unfortunately.  I think he needs thorough work-up for central(multiple sclerosis) and peripheral causes(CIDP or other demyelinating disorder).   MRI lumbar spine, cervical spine and brain all with/without contrast to evaluate for nerve demyelination/enhancement. Consider Lumbar Puncture after workup above Blood work today Emg/ncs right arm and right leg.  Patient would benefit from AFOs for bilateral foot drop Patient would benefit from diabetic shoes to prevent injury due to diabetic foot disease  Orders Placed This Encounter  Procedures  . For Home Use Only DME Diabetic Shoe  . MR BRAIN W WO CONTRAST  . MR CERVICAL SPINE W WO CONTRAST  . MR Lumbar Spine W Wo Contrast  . B12 and Folate Panel  . Methylmalonic acid, serum  . Vitamin B6  . Vitamin B1  . Copper, serum  . Vitamin E  . TSH  . HIV Antibody (routine testing w rflx)  . RPR  . Hepatitis C antibody  . Heavy metals, blood  . CK  . Comprehensive metabolic panel  . CBC with Differential/Platelets  . Multiple Myeloma Panel (SPEP&IFE w/QIG)  . PT orthosis to lower extremity  . NCV with EMG(electromyography)   No orders of the defined types were placed in this encounter.   Cc: Teressa Lower, MD,  Waynard Edwards, NP  Sarina Ill, MD  [facility] [address] [place], [place] [address]  Phone [phone] Fax  [phone]  I spent over 110 minutes of face-to-face and non-face-to-face time with patient on the  1. Bilateral foot-drop   2. Right arm weakness   3. Right wrist drop   4. 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 Weakness of both legs   9. Diabetic polyneuropathy associated with type 1 diabetes mellitus (H[facility])    diagnosis.  This included previsit chart review, lab review, study review, order entry, electronic health record documentation, patient education on the different diagnostic and therapeutic options, counseling and coordination of care, risks and benefits of management, compliance, or risk factor reduction

## 2020-02-18 NOTE — Telephone Encounter (Signed)
Called patient per Dr. Ladona Ridgel about his appointments tomorrow, grandmother answered phone.  She stated he has a therapy appointment at 10 am and they were unsure if they could make the appointment.  After talking with grandmother and explained his appointments tomorrow are at 1:30 and 2:30, they will come to the office after his therapy appointments.

## 2020-02-19 ENCOUNTER — Ambulatory Visit (INDEPENDENT_AMBULATORY_CARE_PROVIDER_SITE_OTHER): Payer: Medicaid Other | Admitting: Pharmacist

## 2020-02-19 ENCOUNTER — Other Ambulatory Visit: Payer: Self-pay

## 2020-02-19 ENCOUNTER — Other Ambulatory Visit (HOSPITAL_COMMUNITY): Payer: Self-pay | Admitting: Pediatrics

## 2020-02-19 ENCOUNTER — Ambulatory Visit: Payer: Medicaid Other | Attending: Pediatrics | Admitting: Physical Therapy

## 2020-02-19 ENCOUNTER — Encounter: Payer: Self-pay | Admitting: Physical Therapy

## 2020-02-19 ENCOUNTER — Other Ambulatory Visit (INDEPENDENT_AMBULATORY_CARE_PROVIDER_SITE_OTHER): Payer: Medicaid Other | Admitting: Pharmacist

## 2020-02-19 VITALS — BP 114/76 | Wt 275.4 lb

## 2020-02-19 DIAGNOSIS — M21371 Foot drop, right foot: Secondary | ICD-10-CM

## 2020-02-19 DIAGNOSIS — R29818 Other symptoms and signs involving the nervous system: Secondary | ICD-10-CM | POA: Diagnosis present

## 2020-02-19 DIAGNOSIS — R208 Other disturbances of skin sensation: Secondary | ICD-10-CM | POA: Insufficient documentation

## 2020-02-19 DIAGNOSIS — M25531 Pain in right wrist: Secondary | ICD-10-CM | POA: Insufficient documentation

## 2020-02-19 DIAGNOSIS — E1065 Type 1 diabetes mellitus with hyperglycemia: Secondary | ICD-10-CM | POA: Diagnosis not present

## 2020-02-19 DIAGNOSIS — R2681 Unsteadiness on feet: Secondary | ICD-10-CM

## 2020-02-19 DIAGNOSIS — M21372 Foot drop, left foot: Secondary | ICD-10-CM | POA: Diagnosis present

## 2020-02-19 DIAGNOSIS — M6281 Muscle weakness (generalized): Secondary | ICD-10-CM

## 2020-02-19 DIAGNOSIS — R278 Other lack of coordination: Secondary | ICD-10-CM | POA: Insufficient documentation

## 2020-02-19 DIAGNOSIS — R2689 Other abnormalities of gait and mobility: Secondary | ICD-10-CM

## 2020-02-19 LAB — POCT URINALYSIS DIPSTICK: Glucose, UA: POSITIVE — AB

## 2020-02-19 LAB — POCT GLUCOSE (DEVICE FOR HOME USE): POC Glucose: 359 mg/dl — AB (ref 70–99)

## 2020-02-19 MED ORDER — ONDANSETRON 8 MG PO TBDP: 8.0000 mg | ORAL_TABLET | Freq: Three times a day (TID) | ORAL | 6 refills | Status: DC | PRN

## 2020-02-19 NOTE — Therapy (Signed)
[facility] [address] [place], [place], [address] Phone: [phone]   Fax:  [phone]  Physical Therapy Treatment  Patient Details  Name: Lucas Cunningham MRN: [id number] Date of Birth: [birth year] Referring Provider (PT): [name]   Encounter Date: 02/19/2020   PT End of Session - 02/19/20 1023    Visit Number 7    Number of Visits 17    Authorization Type [employer]    Authorization Time Period 16 visits 12/8-2/02/22    Authorization - Visit Number 6    Authorization - Number of Visits 16    PT Start Time 1017    PT Stop Time 1100    PT Time Calculation (min) 43 min    Equipment Utilized During Treatment Gait belt    Activity Tolerance Patient tolerated treatment well    Behavior During Therapy WFL for tasks assessed/performed           Past Medical History:  Diagnosis Date  . Diabetes mellitus without complication ([place])    type 1    History reviewed. No pertinent surgical history.  There were no vitals filed for this visit.   Subjective Assessment - 02/19/20 1022    Subjective No new complaints. No falls or pain to report.    Limitations Walking;Standing;House hold activities    How long can you stand comfortably? 10 mins    Patient Stated Goals "I want to be able to bring in groceries and help around the house."    Currently in Pain? No/denies    Pain Score 0-No pain              [facility] PT Assessment - 02/19/20 1033      Timed Up and Go Test   TUG Normal TUG    Normal TUG (seconds) 19.88   with RW and bil PLS AFO's with heel wedges               [facility] Adult PT Treatment/Exercise - 02/19/20 1033      Transfers   Transfers Sit to Stand;Stand to Sit    Sit to Stand 6: Modified independent (Device/Increase time)    Five time sit to stand comments  17.31 with bil UE support from standard height surface.    Stand to Sit 6: Modified independent  (Device/Increase time)      Ambulation/Gait   Ambulation/Gait Yes    Ambulation/Gait Assistance 5: Supervision;4: Min guard    Ambulation Distance (Feet) 115 Feet   x1, plus around gym with session   Assistive device Rolling walker    Gait Pattern Step-through pattern;Decreased stride length;Decreased dorsiflexion - right;Decreased dorsiflexion - left;Right steppage;Left steppage;Wide base of support;Trunk flexed    Ambulation Surface Level;Indoor    Gait velocity 14.37 sec's= 2.28 ft with RW and bil PLS AFO's with heel wedge      Self-Care   Self-Care Other Self-Care Comments    Other Self-Care Comments  reviewed current HEP. continued to address questions on new shoes (orthotist had recommeneded we look into diabetic shoes) and braces.      Neuro Re-ed    Neuro Re-ed Details  for balance/muscle re-ed: worked on unsupported standing with bil PLS AFO's on with pt able to released right hand completly by his side. left hand able to raise for brief moments (3-5 sec's) before needing support again. cues/facilitation for tall posture, glut activation adn abdominal bracing to assist with balance.  PT Short Term Goals - 02/19/20 1024      PT SHORT TERM GOAL #1   Title Pt will be IND with initial HEP in order to indicate improved function and balance (Target Date: 02/14/2020)    Baseline 02/19/20: met with current program    Status Achieved    Target Date 02/14/20      PT SHORT TERM GOAL #2   Title Will assess B AFOs and get [employer] involved as needed to decrease fall risk and improve efficiency of gait.    Baseline 02/19/20: appt has been set for Gerald Stabs with [employer] to come for brace assessment on 02/25/20    Status Achieved      PT SHORT TERM GOAL #3   Title Pt will improve gait speed to >/= 2.16 ft/sec w/ LRAD and B AFO as needed to indicate dec fall risk.    Baseline 02/19/20: 2.28 ft/sec with RW and bil PLS AFO's with heel wedge    Time --    Period --    Status Achieved       PT SHORT TERM GOAL #4   Title Pt will improve 5TSS to </=18 secs with BUE support in order to indicate improved strength.    Baseline 02/19/20: 17.31 sec's with bil UE assist from standard height surface    Time --    Period --    Status Achieved      PT SHORT TERM GOAL #5   Title Will assess TUG and update STG/LTG as able    Baseline 02/19/20: 19.88 sec's with bil PLS AFO's with heel wedges    Time --    Period --    Status Achieved      PT SHORT TERM GOAL #6   Title Pt will be able to stand statically without support x 15 secs in order to indicate improved balance and strength.    Baseline 02/19/20: with bil PLS AFO's with heel wedges pt able to hold balance 3-5 sec's before needing UE support.    Time --    Period --    Status Not Met             PT Long Term Goals - 01/15/20 1424      PT LONG TERM GOAL #1   Title Pt will be IND with final HEP in order to indicate improved functional mobility and dec fall risk.  (Target Date: 03/15/20)    Baseline dependent    Time 8    Period Weeks    Status New    Target Date 03/15/20      PT LONG TERM GOAL #2   Title Pt will improve gait speed to >/=2.62 ft/sec with LRAD and B AFOs as needed in order to indicate safe community ambulation.    Baseline 1.56 ft/sec withRW and no AFOs    Time 8    Period Weeks    Status New      PT LONG TERM GOAL #3   Title Pt will improve 5TSS to </=16 secs with single UE support only in order to indicate improved functional strength.    Baseline 21.75 secs with BUE support    Time 8    Period Weeks    Status New      PT LONG TERM GOAL #4   Title Pt will ambulate x 500' over unlevel paved surfaces w/ LRAD and B AFOs as needed at mod I level in order to indicate safe community mobility.  Baseline unable to assess, but is fatigued with 100' indoor gait with RW    Time 8    Period Weeks    Status New      PT LONG TERM GOAL #5   Title Pt will perform dynamic standing balance with intermittent  UE support x 5 mins in order to perform ADLs safely at home.    Baseline Needs BUE support as this time, unable to stand statically without support    Time 8    Period Weeks    Status New                 Plan - 02/19/20 1023    Clinical Impression Statement Today's skilled session continued with use of bil PLS AFO's with heel wedges to check STGs with all goals met except for static standing without UE support. The pt is progressing toward goals and should benefit from continued PT to progress toward unmet goals.    Personal Factors and Comorbidities Comorbidity 3+;Time since onset of injury/illness/exacerbation    Comorbidities see above    Examination-Activity Limitations Bathing;Dressing;Locomotion Level;Transfers;Squat;Stairs;Stand    Examination-Participation Restrictions Community Activity;Driving;School;Yard Work   Occupational hygienist Evolving/Moderate complexity    Rehab Potential Good    PT Frequency 2x / week    PT Duration 8 weeks    PT Treatment/Interventions ADLs/Self Care Home Management;DME Instruction;Gait training;Stair training;Functional mobility training;Therapeutic activities;Therapeutic exercise;Balance training;Neuromuscular re-education;Patient/family education;Orthotic Fit/Training;Passive range of motion;Vestibular    PT Next Visit Plan Continue to work with [place] PLS braces with large heel wedge.  Try elastic laces if he brings new shoes or could try with his current shoes.  Continue BLE strength, activity tolerance and balance.    Consulted and Agree with Plan of Care Patient           Patient will benefit from skilled therapeutic intervention in order to improve the following deficits and impairments:  Abnormal gait,Decreased activity tolerance,Decreased balance,Decreased endurance,Decreased knowledge of precautions,Decreased knowledge of use of DME,Decreased mobility,Decreased safety awareness,Decreased strength,Difficulty  walking,Impaired perceived functional ability,Impaired sensation,Impaired UE functional use,Postural dysfunction  Visit Diagnosis: Unsteadiness on feet  Muscle weakness (generalized)  Other lack of coordination  Other abnormalities of gait and mobility  Foot drop, left  Foot drop, right     Problem List Patient Active Problem List   Diagnosis Date Noted  . Autonomic neuropathy associated with type 1 diabetes mellitus ([place]) 12/03/2019  . DKA, type 1 ([place]) 10/23/2019  . DKA (diabetic ketoacidosis) ([place]) 10/21/2019  . COVID-19 virus infection 10/21/2019  . Diabetic polyneuropathy associated with type 1 diabetes mellitus ([place]) 02/26/2019  . Leg weakness, bilateral 02/25/2019  . Postprandial bloating 02/25/2019  . Sensory problems with limbs 02/25/2019  . Ketonuria 11/04/2018  . Diabetes ([place]) 04/23/2018  . Uncontrolled type 1 diabetes mellitus with hyperglycemia ([place]) 05/24/2017  . Morbid childhood obesity with BMI greater than 99th percentile for age [facility] 05/24/2017  . Insulin resistance 05/24/2017  . Acanthosis 05/24/2017    Willow Ora, PTA, [facility] [address] [place], [place] [address] [phone] 02/19/20, 12:59 PM   Name: Lucas Cunningham MRN: [id number] Date of Birth: 01/05/2001

## 2020-02-20 ENCOUNTER — Ambulatory Visit: Payer: Medicaid Other | Admitting: Podiatry

## 2020-02-21 NOTE — Progress Notes (Signed)
This is a Pediatric Specialist E-Visit (My Chart Video Visit) follow up consult provided via WebEx Pink Maye consented to an E-Visit consult today.  Location of patient: Gaither Biehn is at home  Location of provider: Zachery Conch, PharmD, CPP, CDCES is at office.   Endocrinology provider: Dr. Quincy Sheehan (upcoming appt 03/24/2020 10:30 am)  Dietitian: [name], RD (no upcoming appt) -Previous appts on 06/10/18, 02/25/19, and 12/22/2019  Behavioral health specialist: Dr. Huntley Dec (upcoming appt 03/24/2020 1:00 pm) -No prior appt  Patient referred by Dr. Fransico Michael for diabetes education. PMH is significant for T1DM with severe insulin resistance, acanthosis, diabetic polyneuropathy, morbid obesity, prior hx of COVID-19 infection Oct 28, 2019), and depression. Patient was lost to follow up from 02/25/19 to 12/03/19. At prior appt with Dr. Fransico Michael on 12/03/19, multiple issues were discussed. Patient states Spring/Summer 2021 he lost focus on his DM management due to how his mother passed away (now living with his grandmother). He has been guessing at his Novolog doses (not following the sliding scale plan provided by Dr Vanessa  on 11/04/18). He has SEVERE neuropathy; he can't stand up from a sitting position without using his arms for support and he also experiences numbness and loss of strength in the palm of his right hand. He was previously followed by Dr. Devonne Doughty, but has "aged out" of that practice. He was a no show for two NP appointments at [facility], once in 27-Sep-2022 when his mother died and once in Oct 28, 2022 when hew was admitted with DKA. Dr. Fransico Michael asked the grandmother to call [facility] and obtain a new appointment for him. Patient did mention though at prior appt with Dr Fransico Michael that he wants help in managing his DM. Dr. Fransico Michael advised patient to continue Tresiba 100 units daily (200 units/mL pen) and to re-start Novolog sliding scale + fixed dose of Novolog 10 units three times  daily prior to meals. Dr. Fransico Michael would like to transition patient to a 2 component plan (carb + correction factor) for rapid acting insulin in the future.  At prior appt on 02/19/2020, patient had not been doing well. His uncle recently committed suicide and he has lost 10 lbs. [name] found out about his uncle's death on Christmas. Patient statesd the last week or so of December he stopped taking his medications. He just would lay in bed. He has not worn his Dexcom in 2 days, however, he stated he wa motivated to take better care of himself. He brought his Dexcom supplies today to restart Dexcom. Patient forgot to bring his Dexcom receiver today. His BG was 359 mg/dL via fingerstick and had large ketones in urine.   I connected with Alphonzo Lemmings on 02/26/2020 by video and verified that I am speaking with the correct person using two identifiers. He reports he is taking [place] 50 units daily. For his Humalog, he will take 20 units with breakfast (mostly after breakfast, not before breakfast), 40 units with late lunch/early dinner (taking after he eats), and then he will administer 40 units two additional times after last meal. He reports he has not been snacking. He shows me ketone bottle, which shows he still has small ketones. He reports he has forgotten [place] 3 times this past week. Patient still taking Humalog more. He states his hands limit him from using Afrezza. He states he thinks he has forgotten Humalog twice. He will administer Humalog 20 units ~9-10 AM, 40 units 1-2PM, 40 units 5PM, and 40 units 9:00PM. He states when he injects  40 units it is painful so he only administer 20 units of Humalog at a time.   School: not in school right now; planning to re-enrol in [place] (he is unsure of time of re-enroll)  Insurance Coverage: Managed Medicaid [facility] plan; ID # [id number])  Diabetes Diagnosis: 04/23/18  Family History: T2DM (maternal grandmother, paternal grandfather); T1DM (paternal  grandmother); DM (uncle)  Patient-Reported BG Readings:  -Patient denies hypoglycemic events. --Treats hypoglycemic episode with candy (2 starbusts) --Hypoglycemic symptoms: "cold, calm, peaceful"  Preferred Pharmacy [facility] [phone] - [place], [place] - [address] AT [facility] [address]  [address] [place] [address]  Phone:  [phone] Fax:  [phone]  DEA #:  [id number]  Medication Adherence -Patient denies adherence with [name] and Afrezza/Humalog -Current diabetes medications include: Tresiba 50 units daily (200 units/mL pen), Afrezza/HumalogU200, metformin XR 500 mg daily after breakfast and 1000 mg daily after dinner  --Afrezza: breakfast: 2 of the 8 unit green cartridges (total = 16 units) and lunch: 2 of the 12 unit yellow cartridges (total = 24 units). --If he forgets to take Afrezza out of the fridge he was instructed to administer Humalog U200; breakfast: 20 units and lunch: 40 units. -Prior diabetes medications include: none  Injection Sites (no changes since prior appt on 02/19/2020) -Patient-reports injection sites are right arm, abdomen, right leg --Patient reports independently injecting DM medications. --Patient reports rotating injection sites  Diet (changes since prior appt on 02/19/2020) Patient reported dietary habits (gets $90 in food stamps each month) Eats 2 meals/day and 1 snacks/day Breakfast (8:30-9:00 am): 3 eggs with 2 pieces of whole wheat nature's own brand toast, no [place] breast or bologna Lunch ("anytime"; 12-1PM or sometimes ~3-4PM): sandwich (mayonaise, bologna, cheese) Dinner (10-11pm): skips, ate dinner twice this past week (hamburger helper) Snacks: no snacks  Drinks: water (24 bottle case in 2-3 days)a little bit of milk  Exercise (no changes since prior appt on 02/19/2020) Patient-reported exercise habits: walks around house (10 min at a time, 2x daily), sometimes walk around porch; sometimes  leg kicks in bed everyday for 5 min, doing exercises recommended by PT (using band, marching) does for 5 minutes each day   Monitoring: Patient reports 0-2 episodes of nocturia (nighttime urination) each night. Patient denies neuropathy (nerve pain) in his right hand. Patient denies visual changes. (Followed by ophthalmology) Patient reports self foot exams.  -Patient reports wearing socks/slippers in the house and reports shoes outside.  -Patient reports monitoring for open wounds/cuts on her feet; no open cuts/wounds.   Dexcom Clarity Report    There were no vitals filed for this visit.  Lab Results  Component Value Date   HGBA1C >14 01/26/2020   HGBA1C 14.4 (H) 10/21/2019   HGBA1C 14.0 (A) 02/25/2019   HGBA1C >14 02/25/2019    Lab Results  Component Value Date   CPEPTIDE 3.8 04/23/2018       Assessment:  Patient's goal with DM management: "Get diabetes controlled as independently as possible"  Medication Management/monitoring: BG readings have been elevated consistently throughout the entire day.   Patient is 0% in TIR, 4% high, and 96% very high although he drastically increased his insulin from TDD of 110 --> 194. BG STILL remain elevated throughout entire day - will increase all doses again (20% increase in TDD). Tresiba 50 units daily --> 60 units daily. Humalog 20 units (9-10AM), 40 units (1-2PM), 40 units (5PM), 40 units (9PM) --> 30 units (9-10AM), 50  units (1-2 PM), 50 units (5PM), and 50 units (PM). Thoroughly discussed importance of adherence. Goal is to eliminate ketones from urine. Since it is more challenging for him to use Afrezza and he is used to using Humalog will stick with Humalog for now for consistency. He states when he injects 40 units it is painful so he only administer 20 units of Humalog at a time. Reminded him to rotate injection sites and will monitor for insulin mediated lipohypertrophy. Will opt for closer follow up to hold patient more  accountable. Will follow up 03/02/2020.   Plan: 1. Medications:  a. INCREASE Tresiba 50 units daily b. INCREASE Humalog U200  i. 9-10AM: 20 units --> 30 units ii. 1-2PM: 40 units --> 50 units iii. 5PM: 40 units --> 50 units iv. 9 PM: 40 units --> 50 units  c. Stop using Afrezza for now d. INCREASE metformin XR 500 mg once daily after breakfast and 1000 mg once daily after lunch/dinner --> metformin XR 1000 mg twice daily 2. Monitoring a. Continue Dexcom G6 CGM b. Provided Dexcom sensor/transmitter sample c. Kaseem Vastine has a diagnosis of diabetes, checks blood glucose readings > 4x per day, treats with > 3 insulin injections, and requires frequent adjustments to insulin regimen. This patient will be seen every six months, minimally, to assess adherence to their CGM regimen and diabetes treatment plan 3. Follow Up: 03/02/2020.   This appointment required 60 minutes of patient care (this includes precharting, chart review, review of results, face-to-face care, etc.).  Thank you for involving clinical pharmacist/diabetes educator to assist in providing this patient's care.  Zachery Conch, PharmD, CPP, CDCES

## 2020-02-22 ENCOUNTER — Encounter: Payer: Self-pay | Admitting: Neurology

## 2020-02-23 ENCOUNTER — Encounter: Payer: Self-pay | Admitting: Occupational Therapy

## 2020-02-23 ENCOUNTER — Other Ambulatory Visit: Payer: Self-pay

## 2020-02-23 ENCOUNTER — Ambulatory Visit: Payer: Medicaid Other | Admitting: Rehabilitation

## 2020-02-23 ENCOUNTER — Encounter: Payer: Self-pay | Admitting: Rehabilitation

## 2020-02-23 ENCOUNTER — Ambulatory Visit: Payer: Medicaid Other | Admitting: Occupational Therapy

## 2020-02-23 DIAGNOSIS — M6281 Muscle weakness (generalized): Secondary | ICD-10-CM

## 2020-02-23 DIAGNOSIS — M21371 Foot drop, right foot: Secondary | ICD-10-CM

## 2020-02-23 DIAGNOSIS — R29818 Other symptoms and signs involving the nervous system: Secondary | ICD-10-CM

## 2020-02-23 DIAGNOSIS — M21372 Foot drop, left foot: Secondary | ICD-10-CM

## 2020-02-23 DIAGNOSIS — R2681 Unsteadiness on feet: Secondary | ICD-10-CM | POA: Diagnosis not present

## 2020-02-23 DIAGNOSIS — R278 Other lack of coordination: Secondary | ICD-10-CM

## 2020-02-23 DIAGNOSIS — R2689 Other abnormalities of gait and mobility: Secondary | ICD-10-CM

## 2020-02-23 NOTE — Therapy (Signed)
[facility] [address] [place], [place], [address] Phone: [phone]   Fax:  [phone]  Occupational Therapy Treatment  Patient Details  Name: Lucas Cunningham MRN: [id number] Date of Birth: 09/15/2000 Referring Provider (OT): Frederico Hamman, NP   Encounter Date: 02/23/2020   OT End of Session - 02/23/20 1232    Visit Number 7    Number of Visits 17    Date for OT Re-Evaluation 03/11/20    Authorization Type [employer]    Authorization Time Period 27 OT/PT/ST (Week 4 of 8)    OT Start Time 1235    OT Stop Time 1315    OT Time Calculation (min) 40 min    Activity Tolerance Patient tolerated treatment well    Behavior During Therapy [facility] for tasks assessed/performed           Past Medical History:  Diagnosis Date  . Diabetes mellitus without complication (HCC)    type 1    Past Surgical History:  Procedure Laterality Date  . NO PAST SURGERIES      There were no vitals filed for this visit.   Subjective Assessment - 02/23/20 1242    Subjective  Pt reports the index finger on the LUE is now having some of the deficits that present in the RUE. Pt denies any pain.    Patient is accompanied by: Family member   grandmother - waited in lobby   Pertinent History PMH Type I diabetes, depression, peripheral neuropathy    Limitations Fall Risk. Sensation deficits in RUE    Patient Stated Goals Pt wants "to be able to grab things and write and use my fingers"    Currently in Pain? No/denies                        OT Treatments/Exercises (OP) - 02/23/20 1240      ADLs   LB Dressing doffed and donned socks and shoes with mod A this day. Pt required assistance for pulling sock over heel and pulling shoe up in the back. Pt would benefit from practice with a shoe horn and/or elastic shoelaces.      Wrist Exercises   Other wrist exercises AROM wrist extension and maintaining control with wrist  while elbow in 90 degrees on table with eccentric and concentric movements. Pt with increased control and activation with wrist extension this day      Hand Exercises   Other Hand Exercises level 2 hand gripper with LUE for increased grip strength in least effected hand Pt with fatigue and moderate drops throughout activity      Modalities   Modalities Electrical Stimulation      Electrical Stimulation   Electrical Stimulation Location RUE wrist extension    Electrical Stimulation Action 10 minutes NMES    Electrical Stimulation Parameters 50 pps 2 sec ramp 10 on and 10 off    Electrical Stimulation Goals Strength;Neuromuscular facilitation                    OT Short Term Goals - 01/29/20 1442      OT SHORT TERM GOAL #1   Title Pt will be independent with initial HEP targeting grip strength and range of motion    Baseline not issued yet    Time 4    Period Weeks    Status On-going    Target Date 02/12/20      OT SHORT TERM GOAL #  2   Title Pt will verbalize understanding of sensory strategies for RUE and safety    Baseline have not reviewed    Time 4    Period Weeks    Status On-going      OT SHORT TERM GOAL #3   Title Pt will increase grip strength in LUE by 5 lbs and RUE by 2 lbs for increase in functional use of BUE for ADLs and IADLs    Baseline RUE 5 LUE 29.5    Time 4    Period Weeks    Status On-going      OT SHORT TERM GOAL #4   Title Pt will perform donning and doffing socks and shoes with supervision for increase in independence with ADLs    Baseline unable to don socks consistently    Time 4    Period Weeks    Status New      OT SHORT TERM GOAL #5   Title Pt will perform simple cold meal prep (sandwich) and/or light housekeeping with supervision    Baseline not completing at this time    Time 4    Period Weeks    Status New             OT Long Term Goals - 01/15/20 1505      OT LONG TERM GOAL #1   Title Pt will be independent with  updated HEP 03/11/2020    Baseline not issued yet    Time 8    Period Weeks    Status New    Target Date 03/11/20      OT LONG TERM GOAL #2   Title Pt will perform simple warm meal prep and/or light house keeping task with mod I and good safety awareness    Baseline not completing at eval    Time 8    Period Weeks    Status New      OT LONG TERM GOAL #3   Title Pt will improve grip strengthening in LUE by 10 lbs and RUE by 6 lbs for increased ability to perform clothing management for ADLs    Baseline LUE 29.5 RUE 5    Time 8    Period Weeks    Status New      OT LONG TERM GOAL #4   Title Pt will increased box and blocks score in RUE to 12 blocks or more for increase in functional use of RUE    Baseline RUE 6    Time 8    Period Weeks    Status New      OT LONG TERM GOAL #5   Title Pt will report decrease pain in RUE wrist with functional use to no more than 6/10    Baseline 9/10    Time 8    Period Weeks    Status New      OT LONG TERM GOAL #6   Title Pt will complete basic ADLs mod Independent with adapted equipment and strategies PRN    Baseline difficulty with donning socks, cutting up food and fasteners    Time 8    Period Weeks    Status New                 Plan - 02/23/20 1353    Clinical Impression Statement Pt continues to progress towards goals with limitations and barriers with neuropathy in BUE.    OT Occupational Profile and History Problem Focused Assessment - Including review of  records relating to presenting problem    Occupational performance deficits (Please refer to evaluation for details): IADL's;ADL's;Leisure;Social Participation    Body Structure / Function / Physical Skills ADL;Balance;Coordination;Decreased knowledge of use of DME;Edema;Flexibility;FMC;Dexterity;Strength;GMC;Pain;Tone;UE functional use;ROM;IADL;Sensation    Psychosocial Skills Coping Strategies    Rehab Potential Good    Clinical Decision Making Limited treatment  options, no task modification necessary    Comorbidities Affecting Occupational Performance: None    Modification or Assistance to Complete Evaluation  No modification of tasks or assist necessary to complete eval    OT Frequency 2x / week    OT Duration 8 weeks    OT Treatment/Interventions Self-care/ADL training;Moist Heat;DME and/or AE instruction;Splinting;Balance training;Therapeutic activities;Aquatic Therapy;Therapeutic exercise;Ultrasound;Electrical Stimulation;Cryotherapy;Neuromuscular education;Paraffin;Energy conservation;Manual Therapy;Patient/family education;Passive range of motion;Functional Mobility Training    Plan kitchen mobility - simple cooking?    Recommended Other Services Pt is receiving PT    Consulted and Agree with Plan of Care Patient;Family member/caregiver    Family Member Consulted grandmother           Patient will benefit from skilled therapeutic intervention in order to improve the following deficits and impairments:   Body Structure / Function / Physical Skills: ADL,Balance,Coordination,Decreased knowledge of use of DME,Edema,Flexibility,FMC,Dexterity,Strength,GMC,Pain,Tone,UE functional use,ROM,IADL,Sensation   Psychosocial Skills: Coping Strategies   Visit Diagnosis: Unsteadiness on feet  Other lack of coordination  Muscle weakness (generalized)  Other abnormalities of gait and mobility  Other symptoms and signs involving the nervous system    Problem List Patient Active Problem List   Diagnosis Date Noted  . 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 DKA, type 1 (HCC) 10/23/2019  . DKA (diabetic ketoacidosis) (HCC) 10/21/2019  . COVID-19 virus infection 10/21/2019  . Diabetic polyneuropathy associated with type 1 diabetes mellitus (HCC) 02/26/2019  . Leg weakness, bilateral 02/25/2019  . Postprandial bloating 02/25/2019  . Sensory problems with limbs 02/25/2019  . Ketonuria 11/04/2018  .  Diabetes (HCC) 04/23/2018  . Uncontrolled type 1 diabetes mellitus with hyperglycemia (HCC) 05/24/2017  . Morbid childhood obesity with BMI greater than 99th percentile for age [facility]) 05/24/2017  . Insulin resistance 05/24/2017  . Acanthosis 05/24/2017    Junious Dresser MOT, OTR/L  02/23/2020, 1:53 PM  [facility] [address] [place], [place], [address] Phone: [phone]   Fax:  [phone]  Name: Lucas Cunningham MRN: [id number] Date of Birth: [birth year]

## 2020-02-23 NOTE — Therapy (Signed)
[facility] [address] [place], [place], [address] Phone: [phone]   Fax:  [phone]  Physical Therapy Treatment  Patient Details  Name: Lucas Cunningham MRN: [id number] Date of Birth: 04/01/2000 Referring Provider (PT): [name]   Encounter Date: 02/23/2020   PT End of Session - 02/23/20 1244    Visit Number 8    Number of Visits 17    Authorization Type [employer]    Authorization Time Period 16 visits 12/8-2/02/22    Authorization - Visit Number 7    Authorization - Number of Visits 16    PT Start Time 7322    PT Stop Time 1230    PT Time Calculation (min) 42 min    Equipment Utilized During Treatment Gait belt    Activity Tolerance Patient tolerated treatment well    Behavior During Therapy WFL for tasks assessed/performed           Past Medical History:  Diagnosis Date  . Diabetes mellitus without complication ([place])    type 1    Past Surgical History:  Procedure Laterality Date  . NO PAST SURGERIES      There were no vitals filed for this visit.   Subjective Assessment - 02/23/20 1151    Subjective No changes since last visit. No pain, falls.    Limitations Walking;Standing;House hold activities    Patient Stated Goals "I want to be able to bring in groceries and help around the house."    Currently in Pain? No/denies                             [facility] Adult PT Treatment/Exercise - 02/23/20 1217      Ambulation/Gait   Ambulation/Gait Yes    Ambulation/Gait Assistance 5: Supervision    Ambulation/Gait Assistance Details Performed all gait with RW and B Thuasne PLS AFO/heel wedges during session.  Note less steppage gait and improved gait speed throughout.  Cues for more upright posture and improved hip extension.    Ambulation Distance (Feet) 200 Feet    Assistive device Rolling walker    Gait Pattern Step-through pattern;Decreased stride  length;Decreased dorsiflexion - right;Decreased dorsiflexion - left;Right steppage;Left steppage;Wide base of support;Trunk flexed    Ambulation Surface Level;Indoor    Stairs Yes    Stairs Assistance 4: Min guard;4: Min assist    Stairs Assistance Details (indicate cue type and reason) Pt able to do all 4 stairs today with B rails. Ascended with RLE, descended with LLE. Cues for power up through leading leg.    Stair Management Technique Two rails;Step to pattern;Forwards    Number of Stairs 4    Height of Stairs 6      Exercises   Other Exercises  Forward marching x 4 laps with BUE support, forward step ups x 15 reps to 4" step and BUE support with cues for improved hip extension, Lateral step ups x 20 reps with BUE support and cues for improved hip extension and upright posture.  Wall bumps (in // bars) with feet close x 10 reps with single UE support progressing to no UE support x 10 reps (needed intermittent assist but better with cues for control).  Moved feet further away and performed x 10 reps with BUE support>single UE support.  ended session with supine BLE bridging x 10 reps, SL clam x 10 reps on each side with pt able to do straight  leg hip abd with RLE x 10 reps.                  PT Education - 02/23/20 1244    Education Details Education that Cala Bradford would be present 1/19 during PT session    Person(s) Educated Patient    Methods Explanation    Comprehension Verbalized understanding            PT Short Term Goals - 02/19/20 1024      PT SHORT TERM GOAL #1   Title Pt will be IND with initial HEP in order to indicate improved function and balance (Target Date: 02/14/2020)    Baseline 02/19/20: met with current program    Status Achieved    Target Date 02/14/20      PT SHORT TERM GOAL #2   Title Will assess B AFOs and get [facility] involved as needed to decrease fall risk and improve efficiency of gait.    Baseline 02/19/20: appt has been set for Gerald Stabs with [employer] to come  for brace assessment on 02/25/20    Status Achieved      PT SHORT TERM GOAL #3   Title Pt will improve gait speed to >/= 2.16 ft/sec w/ LRAD and B AFO as needed to indicate dec fall risk.    Baseline 02/19/20: 2.28 ft/sec with RW and bil PLS AFO's with heel wedge    Time --    Period --    Status Achieved      PT SHORT TERM GOAL #4   Title Pt will improve 5TSS to </=18 secs with BUE support in order to indicate improved strength.    Baseline 02/19/20: 17.31 sec's with bil UE assist from standard height surface    Time --    Period --    Status Achieved      PT SHORT TERM GOAL #5   Title Will assess TUG and update STG/LTG as able    Baseline 02/19/20: 19.88 sec's with bil PLS AFO's with heel wedges    Time --    Period --    Status Achieved      PT SHORT TERM GOAL #6   Title Pt will be able to stand statically without support x 15 secs in order to indicate improved balance and strength.    Baseline 02/19/20: with bil PLS AFO's with heel wedges pt able to hold balance 3-5 sec's before needing UE support.    Time --    Period --    Status Not Met             PT Long Term Goals - 01/15/20 1424      PT LONG TERM GOAL #1   Title Pt will be IND with final HEP in order to indicate improved functional mobility and dec fall risk.  (Target Date: 03/15/20)    Baseline dependent    Time 8    Period Weeks    Status New    Target Date 03/15/20      PT LONG TERM GOAL #2   Title Pt will improve gait speed to >/=2.62 ft/sec with LRAD and B AFOs as needed in order to indicate safe community ambulation.    Baseline 1.56 ft/sec withRW and no AFOs    Time 8    Period Weeks    Status New      PT LONG TERM GOAL #3   Title Pt will improve 5TSS to </=16 secs with single UE support only  in order to indicate improved functional strength.    Baseline 21.75 secs with BUE support    Time 8    Period Weeks    Status New      PT LONG TERM GOAL #4   Title Pt will ambulate x 500' over unlevel paved  surfaces w/ LRAD and B AFOs as needed at mod I level in order to indicate safe community mobility.    Baseline unable to assess, but is fatigued with 100' indoor gait with RW    Time 8    Period Weeks    Status New      PT LONG TERM GOAL #5   Title Pt will perform dynamic standing balance with intermittent UE support x 5 mins in order to perform ADLs safely at home.    Baseline Needs BUE support as this time, unable to stand statically without support    Time 8    Period Weeks    Status New                 Plan - 02/23/20 1246    Clinical Impression Statement Skilled session focused on BLE strengthening with standing therex, stairs and supine/SL therex.  Did all upright activity and gait with B PLS AFOs/heel wedges with no issues.    Personal Factors and Comorbidities Comorbidity 3+;Time since onset of injury/illness/exacerbation    Comorbidities see above    Examination-Activity Limitations Bathing;Dressing;Locomotion Level;Transfers;Squat;Stairs;Stand    Examination-Participation Restrictions Community Activity;Driving;School;Yard Work   Occupational hygienist Evolving/Moderate complexity    Rehab Potential Good    PT Frequency 2x / week    PT Duration 8 weeks    PT Treatment/Interventions ADLs/Self Care Home Management;DME Instruction;Gait training;Stair training;Functional mobility training;Therapeutic activities;Therapeutic exercise;Balance training;Neuromuscular re-education;Patient/family education;Orthotic Fit/Training;Passive range of motion;Vestibular    PT Next Visit Plan Continue to work with Thuasne PLS braces with large heel wedge.  Try elastic laces if he brings new shoes or could try with his current shoes.  Continue BLE strength, activity tolerance and balance.    PT Home Exercise Plan [employer] to come to 1/19 session    Consulted and Agree with Plan of Care Patient           Patient will benefit from skilled therapeutic intervention in  order to improve the following deficits and impairments:  Abnormal gait,Decreased activity tolerance,Decreased balance,Decreased endurance,Decreased knowledge of precautions,Decreased knowledge of use of DME,Decreased mobility,Decreased safety awareness,Decreased strength,Difficulty walking,Impaired perceived functional ability,Impaired sensation,Impaired UE functional use,Postural dysfunction  Visit Diagnosis: Unsteadiness on feet  Muscle weakness (generalized)  Other abnormalities of gait and mobility  Foot drop, left  Foot drop, right     Problem List Patient Active Problem List   Diagnosis Date Noted  . Autonomic neuropathy associated with type 1 diabetes mellitus ([place]) 12/03/2019  . DKA, type 1 ([place]) 10/23/2019  . DKA (diabetic ketoacidosis) ([place]) 10/21/2019  . COVID-19 virus infection 10/21/2019  . Diabetic polyneuropathy associated with type 1 diabetes mellitus ([place]) 02/26/2019  . Leg weakness, bilateral 02/25/2019  . Postprandial bloating 02/25/2019  . Sensory problems with limbs 02/25/2019  . Ketonuria 11/04/2018  . Diabetes ([place]) 04/23/2018  . Uncontrolled type 1 diabetes mellitus with hyperglycemia ([place]) 05/24/2017  . Morbid childhood obesity with BMI greater than 99th percentile for age [facility]) 05/24/2017  . Insulin resistance 05/24/2017  . Acanthosis 05/24/2017    Cameron Sprang, PT, MPT [facility] [address] [place], [place], [address] Phone: [phone]  Fax:  [phone] 02/23/20, 12:52 PM  Name: Lucas Cunningham MRN: [id number] Date of Birth: 07/31/2000

## 2020-02-24 ENCOUNTER — Telehealth: Payer: Self-pay | Admitting: Neurology

## 2020-02-24 NOTE — Telephone Encounter (Signed)
[employer] pending faxed notes.  °

## 2020-02-25 ENCOUNTER — Encounter: Payer: Self-pay | Admitting: Occupational Therapy

## 2020-02-25 ENCOUNTER — Other Ambulatory Visit: Payer: Self-pay

## 2020-02-25 ENCOUNTER — Telehealth: Payer: Self-pay | Admitting: *Deleted

## 2020-02-25 ENCOUNTER — Ambulatory Visit: Payer: Medicaid Other | Admitting: Occupational Therapy

## 2020-02-25 ENCOUNTER — Telehealth: Payer: Self-pay | Admitting: Rehabilitation

## 2020-02-25 ENCOUNTER — Ambulatory Visit: Payer: Medicaid Other | Admitting: Rehabilitation

## 2020-02-25 ENCOUNTER — Encounter: Payer: Self-pay | Admitting: Rehabilitation

## 2020-02-25 DIAGNOSIS — M6281 Muscle weakness (generalized): Secondary | ICD-10-CM

## 2020-02-25 DIAGNOSIS — R2689 Other abnormalities of gait and mobility: Secondary | ICD-10-CM

## 2020-02-25 DIAGNOSIS — M21372 Foot drop, left foot: Secondary | ICD-10-CM

## 2020-02-25 DIAGNOSIS — M21371 Foot drop, right foot: Secondary | ICD-10-CM

## 2020-02-25 DIAGNOSIS — R278 Other lack of coordination: Secondary | ICD-10-CM

## 2020-02-25 DIAGNOSIS — M25531 Pain in right wrist: Secondary | ICD-10-CM

## 2020-02-25 DIAGNOSIS — R29818 Other symptoms and signs involving the nervous system: Secondary | ICD-10-CM

## 2020-02-25 DIAGNOSIS — R2681 Unsteadiness on feet: Secondary | ICD-10-CM

## 2020-02-25 LAB — COMPREHENSIVE METABOLIC PANEL
ALT: 17 IU/L (ref 0–44)
AST: 12 IU/L (ref 0–40)
Albumin/Globulin Ratio: 1.9 (ref 1.2–2.2)
Albumin: 4.7 g/dL (ref 4.1–5.2)
Alkaline Phosphatase: 87 IU/L (ref 51–125)
BUN/Creatinine Ratio: 28 — ABNORMAL HIGH (ref 9–20)
BUN: 13 mg/dL (ref 6–20)
Bilirubin Total: 0.3 mg/dL (ref 0.0–1.2)
CO2: 20 mmol/L (ref 20–29)
Calcium: 10 mg/dL (ref 8.7–10.2)
Chloride: 100 mmol/L (ref 96–106)
Creatinine, Ser: 0.46 mg/dL — ABNORMAL LOW (ref 0.76–1.27)
GFR calc Af Amer: 188 mL/min/{1.73_m2} (ref >59)
GFR calc non Af Amer: 163 mL/min/{1.73_m2} (ref >59)
Globulin, Total: 2.5 g/dL (ref 1.5–4.5)
Glucose: 251 mg/dL — ABNORMAL HIGH (ref 65–99)
Potassium: 4.2 mmol/L (ref 3.5–5.2)
Sodium: 137 mmol/L (ref 134–144)
Total Protein: 7.2 g/dL (ref 6.0–8.5)

## 2020-02-25 LAB — CBC WITH DIFFERENTIAL/PLATELET
Basophils Absolute: 0 10*3/uL (ref 0.0–0.2)
Basos: 0 %
EOS (ABSOLUTE): 0.1 10*3/uL (ref 0.0–0.4)
Eos: 1 %
Hematocrit: 45.6 % (ref 37.5–51.0)
Hemoglobin: 14.9 g/dL (ref 13.0–17.7)
Immature Grans (Abs): 0 10*3/uL (ref 0.0–0.1)
Immature Granulocytes: 0 %
Lymphocytes Absolute: 1.8 10*3/uL (ref 0.7–3.1)
Lymphs: 43 %
MCH: 26.8 pg (ref 26.6–33.0)
MCHC: 32.7 g/dL (ref 31.5–35.7)
MCV: 82 fL (ref 79–97)
Monocytes Absolute: 0.4 10*3/uL (ref 0.1–0.9)
Monocytes: 8 %
Neutrophils Absolute: 2 10*3/uL (ref 1.4–7.0)
Neutrophils: 48 %
Platelets: 168 10*3/uL (ref 150–450)
RBC: 5.56 x10E6/uL (ref 4.14–5.80)
RDW: 13 % (ref 11.6–15.4)
WBC: 4.2 10*3/uL (ref 3.4–10.8)

## 2020-02-25 LAB — VITAMIN B6: Vitamin B6: 19 ug/L (ref 5.3–46.7)

## 2020-02-25 LAB — HEPATITIS C ANTIBODY: Hep C Virus Ab: <0.1 s/co ratio (ref 0.0–0.9)

## 2020-02-25 LAB — HEAVY METALS, BLOOD
Arsenic: 1 ug/L — ABNORMAL LOW (ref 2–23)
Lead, Blood: <1 ug/dL (ref 0–4)
Mercury: <1 ug/L (ref 0.0–14.9)

## 2020-02-25 LAB — TSH: TSH: 3.24 u[IU]/mL (ref 0.450–4.500)

## 2020-02-25 LAB — RPR: RPR Ser Ql: NONREACTIVE

## 2020-02-25 LAB — MULTIPLE MYELOMA PANEL, SERUM
Albumin SerPl Elph-Mcnc: 4 g/dL (ref 2.9–4.4)
Albumin/Glob SerPl: 1.3 (ref 0.7–1.7)
Alpha 1: 0.2 g/dL (ref 0.0–0.4)
Alpha2 Glob SerPl Elph-Mcnc: 0.9 g/dL (ref 0.4–1.0)
B-Globulin SerPl Elph-Mcnc: 1.2 g/dL (ref 0.7–1.3)
Gamma Glob SerPl Elph-Mcnc: 1 g/dL (ref 0.4–1.8)
Globulin, Total: 3.2 g/dL (ref 2.2–3.9)
IgA/Immunoglobulin A, Serum: 63 mg/dL — ABNORMAL LOW (ref 90–386)
IgG (Immunoglobin G), Serum: 1069 mg/dL (ref 671–1456)
IgM (Immunoglobulin M), Srm: 41 mg/dL (ref 35–168)

## 2020-02-25 LAB — CK: Total CK: 301 U/L (ref 49–439)

## 2020-02-25 LAB — COPPER, SERUM: Copper: 106 ug/dL (ref 63–121)

## 2020-02-25 LAB — VITAMIN E
Vitamin E (Alpha Tocopherol): 11.4 mg/L (ref 5.0–13.2)
Vitamin E(Gamma Tocopherol): 3.3 mg/L (ref 0.8–3.8)

## 2020-02-25 LAB — B12 AND FOLATE PANEL
Folate: 16.1 ng/mL (ref >3.0)
Vitamin B-12: 802 pg/mL (ref 232–1245)

## 2020-02-25 LAB — VITAMIN B1: Thiamine: 114.1 nmol/L (ref 66.5–200.0)

## 2020-02-25 LAB — HIV ANTIBODY (ROUTINE TESTING W REFLEX): HIV Screen 4th Generation wRfx: NONREACTIVE

## 2020-02-25 LAB — METHYLMALONIC ACID, SERUM: Methylmalonic Acid: 61 nmol/L (ref 0–378)

## 2020-02-25 NOTE — Telephone Encounter (Signed)
Dr. Lucia Gaskins,   I am seeing Lucas Cunningham here at [facility] for PT.  I would have sent this message sooner had I realized you were seeing him.  I have a few request please.    He has received a consult for B AFOs and needs an order placed in Epic for these so I can give to [facility].  Because he is Medicaid he needs a face to face.  Your note (1/5) is is great that it mentions the foot drop, however is there any way that you could addend and add that he would benefit from B AFOs to reduce falls??   Also he would be a great candidate for diabetic shoes.  Could you please also add this into your visit note from 1/5 and place order for diabetic shoes and I can also send this to [employer].    So sorry for all these request! I really appreciate it!  Harriet Butte, PT, MPT [facility] [address] [place], [place], [address] Phone: [phone]   Fax:  [phone] 02/25/20, 1:40 PM

## 2020-02-25 NOTE — Telephone Encounter (Signed)
[employer]cd [employer] auth: [id number], [id number] & [id number] (exp. 02/24/20 to 04/24/20) order faxed to [facility] they will reach out to the patient to schedule. That is who is in network with the patients plan.

## 2020-02-25 NOTE — Telephone Encounter (Signed)
What is the order to place AFOs and diabetic shoes? I don;t see an order under durable medical equipment and no order for AFO.

## 2020-02-25 NOTE — Telephone Encounter (Signed)
APPT SCHEDULED 03/08/20 815AM

## 2020-02-25 NOTE — Telephone Encounter (Signed)
Called patient, grandmother answered phone. She is on DPR but asked that he be called later.

## 2020-02-25 NOTE — Therapy (Signed)
[facility] [address] [place], [place], [address] Phone: [phone]   Fax:  [phone]  Physical Therapy Treatment  Patient Details  Name: Lucas Cunningham MRN: [id number] Date of Birth: Feb 21, 2000 Referring Provider (PT): [name]   Encounter Date: 02/25/2020   PT End of Session - 02/25/20 1332    Visit Number 9    Number of Visits 17    Authorization Type [employer]    Authorization Time Period 16 visits 12/8-2/02/22    Authorization - Visit Number 8    Authorization - Number of Visits 16    PT Start Time 1232    PT Stop Time 1315    PT Time Calculation (min) 43 min    Equipment Utilized During Treatment Gait belt    Activity Tolerance Patient tolerated treatment well    Behavior During Therapy WFL for tasks assessed/performed           Past Medical History:  Diagnosis Date  . Diabetes mellitus without complication ([place])    type 1    Past Surgical History:  Procedure Laterality Date  . NO PAST SURGERIES      There were no vitals filed for this visit.   Subjective Assessment - 02/25/20 1236    Subjective No changes since last visit.    Patient Stated Goals "I want to be able to bring in groceries and help around the house."    Currently in Pain? No/denies                             [facility] Adult PT Treatment/Exercise - 02/25/20 1323      Transfers   Transfers Sit to Stand;Stand to Sit    Sit to Stand 5: Supervision    Stand to Sit 5: Supervision    Comments Performed x 10 reps with cues for scooting to edge of mat, increasing forward weight shift and increased activation of quads/glutes to reduce going into hyperextension.      Ambulation/Gait   Ambulation/Gait Yes    Ambulation/Gait Assistance 5: Supervision    Ambulation/Gait Assistance Details Gerald Stabs from [employer] present during part of session for formal assessment of AFO/diabetic shoe needs.   Had pt ambulate without braces initially x 60' with RW at S level.  Then donned B Thuasne PLS braces (without heel wedges) and had pt ambulate x 100' with RW at S level.  Cues to reduce hip flex.  Gerald Stabs notes that current shoes are very flat and worn but once he has new diabetic shoes that he would likely not need or maybe only need small heel wedge due to having higher heel on shoe.  PT to send request to Dr. Jaynee Eagles who just saw him for foot drop.    Ambulation Distance (Feet) --   see above   Assistive device Rolling walker    Gait Pattern Step-through pattern;Decreased stride length;Decreased dorsiflexion - right;Decreased dorsiflexion - left;Right steppage;Left steppage;Wide base of support;Trunk flexed    Ambulation Surface Level;Indoor      Neuro Re-ed    Neuro Re-ed Details  For core/BLE hip and BUE strength activation:  Worked in tall kneeling on mat with Kaye bench placed in front of pt for UE support performing mini squats x 5 reps with  BUE support>5 reps with single UE support and 5 reps with no UE support.  Tall kneeling hip abd x 10 reps on each side (5  with BUE support and 5 reps with single UE support) with tactile cues at pelvis for increased hip extension and at chest for upright posture.  Progressed to quadruped over small physioball with cues for UE placement performing alternating LE extension x 10 reps, removing ball and performing alternating UE extension x 10 reps, then bird dog x 10 reps with light tactile cues for maintaining posture.                    PT Short Term Goals - 02/19/20 1024      PT SHORT TERM GOAL #1   Title Pt will be IND with initial HEP in order to indicate improved function and balance (Target Date: 02/14/2020)    Baseline 02/19/20: met with current program    Status Achieved    Target Date 02/14/20      PT SHORT TERM GOAL #2   Title Will assess B AFOs and get [facility] involved as needed to decrease fall risk and improve efficiency of gait.     Baseline 02/19/20: appt has been set for Gerald Stabs with [employer] to come for brace assessment on 02/25/20    Status Achieved      PT SHORT TERM GOAL #3   Title Pt will improve gait speed to >/= 2.16 ft/sec w/ LRAD and B AFO as needed to indicate dec fall risk.    Baseline 02/19/20: 2.28 ft/sec with RW and bil PLS AFO's with heel wedge    Time --    Period --    Status Achieved      PT SHORT TERM GOAL #4   Title Pt will improve 5TSS to </=18 secs with BUE support in order to indicate improved strength.    Baseline 02/19/20: 17.31 sec's with bil UE assist from standard height surface    Time --    Period --    Status Achieved      PT SHORT TERM GOAL #5   Title Will assess TUG and update STG/LTG as able    Baseline 02/19/20: 19.88 sec's with bil PLS AFO's with heel wedges    Time --    Period --    Status Achieved      PT SHORT TERM GOAL #6   Title Pt will be able to stand statically without support x 15 secs in order to indicate improved balance and strength.    Baseline 02/19/20: with bil PLS AFO's with heel wedges pt able to hold balance 3-5 sec's before needing UE support.    Time --    Period --    Status Not Met             PT Long Term Goals - 01/15/20 1424      PT LONG TERM GOAL #1   Title Pt will be IND with final HEP in order to indicate improved functional mobility and dec fall risk.  (Target Date: 03/15/20)    Baseline dependent    Time 8    Period Weeks    Status New    Target Date 03/15/20      PT LONG TERM GOAL #2   Title Pt will improve gait speed to >/=2.62 ft/sec with LRAD and B AFOs as needed in order to indicate safe community ambulation.    Baseline 1.56 ft/sec withRW and no AFOs    Time 8    Period Weeks    Status New      PT LONG TERM GOAL #3  Title Pt will improve 5TSS to </=16 secs with single UE support only in order to indicate improved functional strength.    Baseline 21.75 secs with BUE support    Time 8    Period Weeks    Status New      PT LONG  TERM GOAL #4   Title Pt will ambulate x 500' over unlevel paved surfaces w/ LRAD and B AFOs as needed at mod I level in order to indicate safe community mobility.    Baseline unable to assess, but is fatigued with 100' indoor gait with RW    Time 8    Period Weeks    Status New      PT LONG TERM GOAL #5   Title Pt will perform dynamic standing balance with intermittent UE support x 5 mins in order to perform ADLs safely at home.    Baseline Needs BUE support as this time, unable to stand statically without support    Time 8    Period Weeks    Status New                 Plan - 02/25/20 1332    Clinical Impression Statement Skilled session focused on NMR for core/LE/UE activation along with formal assessment by Gerald Stabs from [place] for B AFOs/diabetic shoes.  Will go with B Thuasne PLS AFOs and work on getting rx for diabetic shoes.    Personal Factors and Comorbidities Comorbidity 3+;Time since onset of injury/illness/exacerbation    Comorbidities see above    Examination-Activity Limitations Bathing;Dressing;Locomotion Level;Transfers;Squat;Stairs;Stand    Examination-Participation Restrictions Community Activity;Driving;School;Yard Work   Occupational hygienist Evolving/Moderate complexity    Rehab Potential Good    PT Frequency 2x / week    PT Duration 8 weeks    PT Treatment/Interventions ADLs/Self Care Home Management;DME Instruction;Gait training;Stair training;Functional mobility training;Therapeutic activities;Therapeutic exercise;Balance training;Neuromuscular re-education;Patient/family education;Orthotic Fit/Training;Passive range of motion;Vestibular    PT Next Visit Plan Continue to work with Thuasne PLS braces with quarter heel wedges.  Try elastic laces if he brings new shoes or could try with his current shoes.  Continue BLE strength, activity tolerance and balance.    PT Home Exercise Plan Be on the lookout for order from [place] on AFOs/shoes and  I'm asking her to adend last note to recommend AFOs.  Send to [employer] when we have    Consulted and Agree with Plan of Care Patient           Patient will benefit from skilled therapeutic intervention in order to improve the following deficits and impairments:  Abnormal gait,Decreased activity tolerance,Decreased balance,Decreased endurance,Decreased knowledge of precautions,Decreased knowledge of use of DME,Decreased mobility,Decreased safety awareness,Decreased strength,Difficulty walking,Impaired perceived functional ability,Impaired sensation,Impaired UE functional use,Postural dysfunction  Visit Diagnosis: Unsteadiness on feet  Muscle weakness (generalized)  Other abnormalities of gait and mobility  Foot drop, left  Foot drop, right     Problem List Patient Active Problem List   Diagnosis Date Noted  . 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 DKA, type 1 ([place]) 10/23/2019  . DKA (diabetic ketoacidosis) ([place]) 10/21/2019  . COVID-19 virus infection 10/21/2019  . Diabetic polyneuropathy associated with type 1 diabetes mellitus ([place]) 02/26/2019  . Leg weakness, bilateral 02/25/2019  . Postprandial bloating 02/25/2019  . Sensory problems with limbs 02/25/2019  . Ketonuria 11/04/2018  . Diabetes ([place]) 04/23/2018  . Uncontrolled type 1 diabetes mellitus with hyperglycemia ([place]) 05/24/2017  . Morbid childhood  obesity with BMI greater than 99th percentile for age [facility]) 05/24/2017  . Insulin resistance 05/24/2017  . Acanthosis 05/24/2017    Cameron Sprang, PT, MPT [facility] [address] [place], [place], [address] Phone: [phone]   Fax:  [phone] 02/25/20, 1:37 PM  Name: Ajeet Casasola MRN: [id number] Date of Birth: Jul 01, 2000

## 2020-02-25 NOTE — Telephone Encounter (Signed)
THANK YOU !!!! I asked him abou tthe PFOs and he said he already had them so strange if he doesn't! The diabetic shoes are great too. I will addend my note and place order [facility] YOU !!!

## 2020-02-25 NOTE — Therapy (Signed)
[facility] [address] [place], [place], [address] Phone: [phone]   Fax:  [phone]  Occupational Therapy Treatment  Patient Details  Name: Lucas Cunningham MRN: [id number] Date of Birth: 17-Apr-2000 Referring Provider (OT): Lucas Hamman, NP   Encounter Date: 02/25/2020   OT End of Session - 02/25/20 1201    Visit Number 8    Number of Visits 17    Date for OT Re-Evaluation 03/11/20    Authorization Type [employer]    Authorization Time Period 27 OT/PT/ST (Week 4 of 8)    OT Start Time 1201   patient late for OT session this day   OT Stop Time 1230    OT Time Calculation (min) 29 min    Activity Tolerance Patient tolerated treatment well    Behavior During Therapy [facility] for tasks assessed/performed           Past Medical History:  Diagnosis Date  . Diabetes mellitus without complication (HCC)    type 1    Past Surgical History:  Procedure Laterality Date  . NO PAST SURGERIES      There were no vitals filed for this visit.   Subjective Assessment - 02/25/20 1202    Subjective  Pt denies any pain or changes since last session.    Patient is accompanied by: Family member   grandmother - waited in lobby   Pertinent History PMH Type I diabetes, depression, peripheral neuropathy    Limitations Fall Risk. Sensation deficits in RUE    Patient Stated Goals Pt wants "to be able to grab things and write and use my fingers"    Currently in Pain? No/denies                        OT Treatments/Exercises (OP) - 02/25/20 1213      Neurological Re-education Exercises   Other Exercises 1 AROM and AAROM for RUE wrist extension and flexion. Pt with active flexion but limited trace/no extension. Pt able to maintain neutral wrist in elbow 90 degrees with straight up for increase in isomteric hold for muscle activation. Pt slowly can extend wrist but unable to maintain slow eccentric flexion  secondary to limited wrist extension.    Other Weight-Bearing Exercises 1 weight bearing into RUE on elevated table x 10 for 15 seconds      Modalities   Modalities Electrical Stimulation      Electrical Stimulation   Electrical Stimulation Location wrist    Electrical Stimulation Action extension    Electrical Stimulation Parameters 50 pps 252 width 9 minutes level 25    Electrical Stimulation Goals Neuromuscular facilitation;Strength                    OT Short Term Goals - 01/29/20 1442      OT SHORT TERM GOAL #1   Title Pt will be independent with initial HEP targeting grip strength and range of motion    Baseline not issued yet    Time 4    Period Weeks    Status On-going    Target Date 02/12/20      OT SHORT TERM GOAL #2   Title Pt will verbalize understanding of sensory strategies for RUE and safety    Baseline have not reviewed    Time 4    Period Weeks    Status On-going      OT SHORT TERM GOAL #3   Title Pt  will increase grip strength in LUE by 5 lbs and RUE by 2 lbs for increase in functional use of BUE for ADLs and IADLs    Baseline RUE 5 LUE 29.5    Time 4    Period Weeks    Status On-going      OT SHORT TERM GOAL #4   Title Pt will perform donning and doffing socks and shoes with supervision for increase in independence with ADLs    Baseline unable to don socks consistently    Time 4    Period Weeks    Status New      OT SHORT TERM GOAL #5   Title Pt will perform simple cold meal prep (sandwich) and/or light housekeeping with supervision    Baseline not completing at this time    Time 4    Period Weeks    Status New             OT Long Term Goals - 01/15/20 1505      OT LONG TERM GOAL #1   Title Pt will be independent with updated HEP 03/11/2020    Baseline not issued yet    Time 8    Period Weeks    Status New    Target Date 03/11/20      OT LONG TERM GOAL #2   Title Pt will perform simple warm meal prep and/or light house  keeping task with mod I and good safety awareness    Baseline not completing at eval    Time 8    Period Weeks    Status New      OT LONG TERM GOAL #3   Title Pt will improve grip strengthening in LUE by 10 lbs and RUE by 6 lbs for increased ability to perform clothing management for ADLs    Baseline LUE 29.5 RUE 5    Time 8    Period Weeks    Status New      OT LONG TERM GOAL #4   Title Pt will increased box and blocks score in RUE to 12 blocks or more for increase in functional use of RUE    Baseline RUE 6    Time 8    Period Weeks    Status New      OT LONG TERM GOAL #5   Title Pt will report decrease pain in RUE wrist with functional use to no more than 6/10    Baseline 9/10    Time 8    Period Weeks    Status New      OT LONG TERM GOAL #6   Title Pt will complete basic ADLs mod Independent with adapted equipment and strategies PRN    Baseline difficulty with donning socks, cutting up food and fasteners    Time 8    Period Weeks    Status New                 Plan - 02/25/20 1354    Clinical Impression Statement Pt making slow progress. Pt continues to present with limited wrist extension and severe neuropathy in RUE.    OT Occupational Profile and History Problem Focused Assessment - Including review of records relating to presenting problem    Occupational performance deficits (Please refer to evaluation for details): IADL's;ADL's;Leisure;Social Participation    Body Structure / Function / Physical Skills ADL;Balance;Coordination;Decreased knowledge of use of DME;Edema;Flexibility;FMC;Dexterity;Strength;GMC;Pain;Tone;UE functional use;ROM;IADL;Sensation    Psychosocial Skills Coping Strategies    Rehab  Potential Good    Clinical Decision Making Limited treatment options, no task modification necessary    Comorbidities Affecting Occupational Performance: None    Modification or Assistance to Complete Evaluation  No modification of tasks or assist necessary to  complete eval    OT Frequency 2x / week    OT Duration 8 weeks    OT Treatment/Interventions Self-care/ADL training;Moist Heat;DME and/or AE instruction;Splinting;Balance training;Therapeutic activities;Aquatic Therapy;Therapeutic exercise;Ultrasound;Electrical Stimulation;Cryotherapy;Neuromuscular education;Paraffin;Energy conservation;Manual Therapy;Patient/family education;Passive range of motion;Functional Mobility Training    Plan kitchen mobility - simple cooking (someone in kitchen at this time last session)    Recommended Other Services Pt is receiving PT    Consulted and Agree with Plan of Care Patient;Family member/caregiver    Family Member Consulted grandmother           Patient will benefit from skilled therapeutic intervention in order to improve the following deficits and impairments:   Body Structure / Function / Physical Skills: ADL,Balance,Coordination,Decreased knowledge of use of DME,Edema,Flexibility,FMC,Dexterity,Strength,GMC,Pain,Tone,UE functional use,ROM,IADL,Sensation   Psychosocial Skills: Coping Strategies   Visit Diagnosis: Pain in right wrist  Other symptoms and signs involving the nervous system  Other abnormalities of gait and mobility  Muscle weakness (generalized)  Other lack of coordination    Problem List Patient Active Problem List   Diagnosis Date Noted  . 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 DKA, type 1 (HCC) 10/23/2019  . DKA (diabetic ketoacidosis) (HCC) 10/21/2019  . COVID-19 virus infection 10/21/2019  . Diabetic polyneuropathy associated with type 1 diabetes mellitus (HCC) 02/26/2019  . Leg weakness, bilateral 02/25/2019  . Postprandial bloating 02/25/2019  . Sensory problems with limbs 02/25/2019  . Ketonuria 11/04/2018  . Diabetes (HCC) 04/23/2018  . Uncontrolled type 1 diabetes mellitus with hyperglycemia (HCC) 05/24/2017  . Morbid childhood obesity with BMI greater than 99th  percentile for age [facility]) 05/24/2017  . Insulin resistance 05/24/2017  . Acanthosis 05/24/2017    Junious Dresser MOT, OTR/L  02/25/2020, 1:59 PM  [facility] [address] [place], [place], [address] Phone: [phone]   Fax:  [phone]  Name: Lucas Cunningham MRN: [id number] Date of Birth: 2000/04/15

## 2020-02-25 NOTE — Telephone Encounter (Signed)
Emily: I placed 2 orders, one for durable medical equipment for diabetic shoes and placed an order for PT AFOs. Let me know if those aren't the right order thanks again!

## 2020-02-25 NOTE — Addendum Note (Signed)
Addended by: Naomie Dean B on: 02/25/2020 03:56 PM   Modules accepted: Orders

## 2020-02-26 ENCOUNTER — Telehealth (INDEPENDENT_AMBULATORY_CARE_PROVIDER_SITE_OTHER): Payer: Medicaid Other | Admitting: Pharmacist

## 2020-02-26 DIAGNOSIS — E1065 Type 1 diabetes mellitus with hyperglycemia: Secondary | ICD-10-CM | POA: Diagnosis not present

## 2020-02-28 NOTE — Progress Notes (Deleted)
Endocrinology provider: Dr. Quincy Sheehan (upcoming appt 03/24/2020 10:30 am)  Dietitian: [name], RD (no upcoming appt) -Previous appts on 06/10/18, 02/25/19, and 12/22/2019  Behavioral health specialist: Dr. Huntley Dec (upcoming appt 03/24/2020 1:00 pm) -No prior appt  Patient referred by Dr. Fransico Michael for diabetes education. PMH is significant for T1DM with severe insulin resistance, acanthosis, diabetic polyneuropathy, morbid obesity, prior hx of COVID-19 infection 30-Nov-2019), and depression. Patient was lost to follow up from 02/25/19 to 12/03/19. At prior appt with Dr. Fransico Michael on 12/03/19, multiple issues were discussed. Patient states Spring/Summer 2021 he lost focus on his DM management due to how his mother passed away (now living with his grandmother). He has been guessing at his Novolog doses (not following the sliding scale plan provided by Dr Vanessa Meeteetse on 11/04/18). He has SEVERE neuropathy; he can't stand up from a sitting position without using his arms for support and he also experiences numbness and loss of strength in the palm of his right hand. He was previously followed by Dr. Devonne Doughty, but has "aged out" of that practice. He was a no show for two NP appointments at [facility], once in 2022/10/30 when his mother died and once in 2022-11-30 when hew was admitted with DKA. Dr. Fransico Michael asked the grandmother to call [facility] and obtain a new appointment for him. Patient did mention though at prior appt with Dr Fransico Michael that he wants help in managing his DM. Dr. Fransico Michael advised patient to continue Tresiba 100 units daily (200 units/mL pen) and to re-start Novolog sliding scale + fixed dose of Novolog 10 units three times daily prior to meals. Dr. Fransico Michael would like to transition patient to a 2 component plan (carb + correction factor) for rapid acting insulin in the future.  ***  School: not in school right now; planning to re-enrol in [employer] (he is unsure of time of re-enroll)  Insurance  Coverage: Managed Medicaid [facility] plan; ID # [id number])  Diabetes Diagnosis: 04/23/18  Family History: T2DM (maternal grandmother, paternal grandfather); T1DM (paternal grandmother); DM (uncle)  Preferred Pharmacy [facility] [phone] - [place], [place] - [address] AT [facility] [address]  [address] [place] [address]  Phone:  [phone] Fax:  [phone]  DEA #:  [id number]  Medication Adherence -Patient *** adherence with [name] and Afrezza/Humalog -Current diabetes medications include: Tresiba 60 units daily (200 units/mL pen), HumalogU200 (times/dosing below), metformin XR 500 mg daily after breakfast and 1000 mg daily after dinner i. Humalog ii. 9-10AM: 30 units iii. 1-2PM: 50 units iv. 5PM: 50 units v. 9 PM: 50 units  -Prior diabetes medications include: none  Injection Sites (*** changes since prior appt on 02/26/2020) -Patient-reports injection sites are right arm, abdomen, right leg --Patient reports independently injecting DM medications. --Patient reports rotating injection sites  Diet (*** changes since prior appt on 02/26/2020) Patient reported dietary habits (gets $90 in food stamps each month) Eats 2 meals/day and 1 snacks/day Breakfast (8:30-9:00 am): 3 eggs with 2 pieces of whole wheat nature's own brand toast, no [place] breast or bologna Lunch ("anytime"; 12-1PM or sometimes ~3-4PM): sandwich (mayonaise, bologna, cheese) Dinner (10-11pm): skips, ate dinner *** this past week (hamburger helper) Snacks: no snacks  Drinks: water (24 bottle case in 2-3 days), a little bit of milk  Exercise (*** changes since prior appt on 02/26/2020) Patient-reported exercise habits: walks around house (10 min at a time, 2x daily), sometimes walk around porch; sometimes leg kicks in  bed everyday for 5 min, doing exercises recommended by PT (using band, marching) does for 5 minutes each day   Monitoring: Patient reports *** episodes  of nocturia (nighttime urination) each night. Patient *** neuropathy (nerve pain) in his right hand. Patient *** visual changes. (Followed by ophthalmology) Patient *** self foot exams.  -Patient *** wearing socks/slippers in the house and reports shoes outside.  -Patient *** monitoring for open wounds/cuts on her feet; no open cuts/wounds.   Dexcom Clarity Report ***   There were no vitals filed for this visit.  Lab Results  Component Value Date   HGBA1C >14 01/26/2020   HGBA1C 14.4 (H) 10/21/2019   HGBA1C 14.0 (A) 02/25/2019   HGBA1C >14 02/25/2019    Lab Results  Component Value Date   CPEPTIDE 3.8 04/23/2018       Assessment:  Patient's goal with DM management: "Get diabetes controlled as independently as possible"  Medication Management/monitoring: BG readings have been elevated consistently throughout the entire day.   ***  Plan: 2. Medications:  a. *** Tresiba 50 units daily b. *** Humalog U200  i. 9-10AM: 20 units --> 30 units ii. 1-2PM: 40 units --> 50 units iii. 5PM: 40 units --> 50 units iv. 9 PM: 40 units --> 50 units  c. *** metformin XR 1000 mg twice daily 3. Monitoring a. Continue Dexcom G6 CGM b. Provided Dexcom sensor/transmitter sample c. Lucas Cunningham has a diagnosis of diabetes, checks blood glucose readings > 4x per day, treats with > 3 insulin injections, and requires frequent adjustments to insulin regimen. This patient will be seen every six months, minimally, to assess adherence to their CGM regimen and diabetes treatment plan 4. Follow Up: ***  This appointment required *** minutes of patient care (this includes precharting, chart review, review of results, face-to-face care, etc.).  Thank you for involving clinical pharmacist/diabetes educator to assist in providing this patient's care.  Zachery Conch, PharmD, CPP, CDCES

## 2020-03-01 ENCOUNTER — Ambulatory Visit: Payer: Medicaid Other | Admitting: Rehabilitation

## 2020-03-01 ENCOUNTER — Ambulatory Visit: Payer: Medicaid Other | Admitting: Occupational Therapy

## 2020-03-02 ENCOUNTER — Ambulatory Visit (INDEPENDENT_AMBULATORY_CARE_PROVIDER_SITE_OTHER): Payer: Self-pay | Admitting: Pharmacist

## 2020-03-03 ENCOUNTER — Ambulatory Visit: Payer: Medicaid Other | Admitting: Occupational Therapy

## 2020-03-03 ENCOUNTER — Ambulatory Visit: Payer: Medicaid Other

## 2020-03-03 NOTE — Telephone Encounter (Signed)
I called the pt's phone and grandmother Gigi Gin (on [place]) answered. Reviewed lab results, elevated glucose, otherwise Dr Lucia Gaskins didn't have any concerns. Will discuss in more detail at [facility] appt on 1/27 at 8:30 AM arrival 8:00. She verbalized appreciation and her questions were answered.

## 2020-03-03 NOTE — Progress Notes (Signed)
This is a Pediatric Specialist E-Visit (My Chart Video Visit) follow up consult provided via WebEx [name] consented to an E-Visit consult today.  Location of patient: Lucas Cunningham is at home  Location of provider: Zachery Conch, PharmD, CPP, CDCES is at office.   Endocrinology provider: Dr. Quincy Sheehan (upcoming appt 03/24/2020 10:30 am)  Dietitian: [name], RD (no upcoming appt) -Previous appts on 06/10/18, 02/25/19, and 12/22/2019  Behavioral health specialist: Dr. Huntley Dec (upcoming appt 03/24/2020 1:00 pm) -No prior appt  Patient referred by Dr. Fransico Michael for diabetes education. PMH is significant for T1DM with severe insulin resistance, acanthosis, diabetic polyneuropathy, morbid obesity, prior hx of COVID-19 infection 2019/11/09), and depression. Patient was lost to follow up from 02/25/19 to 12/03/19. At prior appt with Dr. Fransico Michael on 12/03/19, multiple issues were discussed. Patient states Spring/Summer 2021 he lost focus on his DM management due to how his mother passed away (now living with his grandmother). He has been guessing at his Novolog doses (not following the sliding scale plan provided by Dr Vanessa Cuthbert on 11/04/18). He has SEVERE neuropathy; he can't stand up from a sitting position without using his arms for support and he also experiences numbness and loss of strength in the palm of his right hand. He was previously followed by Dr. Devonne Doughty, but has "aged out" of that practice. He was a no show for two NP appointments at [facility], once in Oct 09, 2022 when his mother died and once in 09-Nov-2022 when hew was admitted with DKA. Dr. Fransico Michael asked the grandmother to call [facility] and obtain a new appointment for him. Patient did mention though at prior appt with Dr Fransico Michael that he wants help in managing his DM. Dr. Fransico Michael advised patient to continue Tresiba 100 units daily (200 units/mL pen) and to re-start Novolog sliding scale + fixed dose of Novolog 10 units three times  daily prior to meals. Dr. Fransico Michael would like to transition patient to a 2 component plan (carb + correction factor) for rapid acting insulin in the future.  I connected with Lucas Cunningham on 03/04/20 by video and verified that I am speaking with the correct person using two identifiers. Patient changed to virtual visit due to feeling sick. He started feeling sick on 03/02/20. He states he a runny nose and sore throat. He denies abdominal pain, Kussamaul respirations, fruity breath. He last checked his ketones 2 days ago, which were trace. He cannot leave house right now to get COVID-19 tested due to ice on his porch. He reports he takes his Tresiba U200 50 units at 9PM and reports he may have forgot twice in the past week. He takes his Humalog 30 units (takes 1 injection of 30 units) at 1-3PM and 50 units (two injections of 25 units) 7PM. He forgot to take at 7 PM twice this past week. He has not forgot to take Humalog between 1-3PM this past week. He takes metformin 9AM and 3PM.    School: not in school right now; planning to re-enrol in [employer] [employer] [employer] (he is unsure of time of re-enroll)  Insurance Coverage: Managed Medicaid [facility] plan; ID # [id number])  Diabetes Diagnosis: 04/23/18  Family History: T2DM (maternal grandmother, paternal grandfather); T1DM (paternal grandmother); DM (uncle)  Preferred Pharmacy [facility] [phone] - [place], [place] - [address] AT [facility] OF [address]  [address] [place] [address]  Phone:  [phone] Fax:  [phone]  DEA #:  [id number]  Medication Adherence -Patient  denies consistent adherence adherence with [name] and Afrezza/Humalog -Current diabetes medications include: Tresiba 60 units daily (200 units/mL pen), HumalogU200 (times/dosing below), metformin XR 500 mg daily after breakfast and 1000 mg daily after dinner i. Humalog ii. 9-10AM: 30 units iii. 1-2PM: 50 units iv. 5PM: 50 units v. 9 PM: 50 units  -Prior  diabetes medications include: none  Injection Sites (no changes since prior appt on 02/26/2020) -Patient-reports injection sites are right arm, abdomen, right leg --Patient reports independently injecting DM medications. --Patient reports rotating injection sites  Diet (no changes since prior appt on 02/26/2020) Patient reported dietary habits (gets $90 in food stamps each month) Eats 2 meals/day and 1 snacks/day Breakfast (8:30-9:00 am): 3 eggs with 2 pieces of whole wheat nature's own brand toast, no [place] breast or bologna Lunch ("anytime"; 12-1PM or sometimes ~3-4PM): sandwich (mayonaise, bologna, cheese) Dinner (10-11pm): skips Snacks: no snacks  Drinks: water (24 bottle case in 2-3 days), a little bit of milk  Exercise (no changes since prior appt on 02/26/2020) Patient-reported exercise habits: walks around house (10 min at a time, 2x daily), sometimes walk around porch; sometimes leg kicks in bed everyday for 5 min, doing exercises recommended by PT (using band, marching) does for 5 minutes each day    Dexcom Clarity Report     There were no vitals filed for this visit.  Lab Results  Component Value Date   HGBA1C >14 01/26/2020   HGBA1C 14.4 (H) 10/21/2019   HGBA1C 14.0 (A) 02/25/2019   HGBA1C >14 02/25/2019    Lab Results  Component Value Date   CPEPTIDE 3.8 04/23/2018       Assessment:  Patient's goal with DM management: "Get diabetes controlled as independently as possible"  Medication Management/monitoring:  BG readings have improved however remain elevated > 300 consistently throughout the entire day. I am concerned that in addition to baseline hyperglycemia that he is sick as he is at risk of worsening hyperglycemia and DKA. Thoroughly discussed s/sx of DKA (abdominal pain, kussmaul respirations, fruity breath, confusion, N/V) and stressed he will need to go to the hospital if that occurs. He verbalized understanding. Advised patient he will need to  increase water intake and check urine ketones consistently while he is sick. Also, advised patient to get COVID-19 testing (emailed information for COVID-19 testing to him at [email or web address]   Will increase TDD 130 --> 156 (20% increase). Will keep basal/bolus ratio 40/60. New basal dose will be Tresiba U200 66 units daily. New bolus dose will be Humalog U200 30 units three times daily (10:30 AM, 3:30 PM, 6:30 PM).   Patient has issues with adherence. His reminders on his phone stopped working as patient reports his phone has had issues. Instructed patient download Realarm app on his computer as he is consistently uses his computer daily. Set 3 reminders on his computer 1) 10:30 AM (take Tresiba 66 units, Humalog 30 units, metformin 1000 mg) 2) 3:30 PM (take Humalog 30 units, metformin 1000 mg) 3) 6:30 PM (take Humalog 30 units). Decided to change Tresiba administration from PM to AM considering patient's adherence worsens in the evenings (also why he was instructed to take 2nd dose of metformin at 3:30 PM to prevent him from forgetting). Also, discussed concern with grandmother and had her right down all of these dosing instructions and check with Lucas Cunningham if he has taken his medications to help make sure he does not forget.   Grandmother mentioned Lucas Cunningham has been out of metformin for a while,  yet Lucas Cunningham did not mention this to me. Lucas Cunningham told me his grandmother helps give him metformin everyday. Sent in refills for metformin 1000 mg twice daily (had to switch from XR to IR due to insurance formulary).    I am concerned for patient as I follow with him since October 2021 almost weekly and patient struggles to follow directions even though I provide written handout and/or email him a copy of instructions and he will repeat back instructions to me prior to ending appointment, however, he often forgets what he was instructed to do. He is very adamant about being independent with his care, but I feel  like it is challenging for him. I wonder if lack of adherence could possibly be due to underlying psychiatric or neurologic condition.    Plan: 2. Medications:  a. Increase Tresiba 50 units daily --> 66 units daily b. Change Humalog U200  i. No more Humalog 50 units 1-2 PM, 50 units 5PM, and 50 units 9 PM as he was consistently forgetting ii. Start Humalog 30 units three times daily (10:30 am, 3:30 pm, 6:30 pm) c. Continue metformin 1000 mg twice daily i. Sent in a new prescription per grandmother's request 3. Monitoring a. Continue Dexcom G6 CGM b. Provided Dexcom sensor/transmitter sample c. Lucas Cunningham has a diagnosis of diabetes, checks blood glucose readings > 4x per day, treats with > 3 insulin injections, and requires frequent adjustments to insulin regimen. This patient will be seen every six months, minimally, to assess adherence to their CGM regimen and diabetes treatment plan 4. Follow Up: 1 week  This appointment required 60 minutes of patient care (this includes precharting, chart review, review of results, virtual care, etc.).  Thank you for involving clinical pharmacist/diabetes educator to assist in providing this patient's care.  Zachery Conch, PharmD, CPP, CDCES

## 2020-03-04 ENCOUNTER — Telehealth (INDEPENDENT_AMBULATORY_CARE_PROVIDER_SITE_OTHER): Payer: Self-pay | Admitting: Pharmacist

## 2020-03-04 ENCOUNTER — Telehealth (INDEPENDENT_AMBULATORY_CARE_PROVIDER_SITE_OTHER): Payer: Medicaid Other | Admitting: Pharmacist

## 2020-03-04 ENCOUNTER — Other Ambulatory Visit: Payer: Self-pay

## 2020-03-04 DIAGNOSIS — E1065 Type 1 diabetes mellitus with hyperglycemia: Secondary | ICD-10-CM | POA: Diagnosis not present

## 2020-03-04 MED ORDER — METFORMIN HCL 1000 MG PO TABS: 1000.0000 mg | ORAL_TABLET | Freq: Two times a day (BID) | ORAL | 3 refills | Status: DC

## 2020-03-04 NOTE — Telephone Encounter (Signed)
Can someone contact patient to be scheduled for 1 week office visit (60 min labeled pharmacy)?  Thank you for involving clinical pharmacist/diabetes educator to assist in providing this patient's care.   Zachery Conch, PharmD, CPP, CDCES

## 2020-03-05 NOTE — Telephone Encounter (Signed)
Patient is scheduled for 12/8.

## 2020-03-08 ENCOUNTER — Encounter: Payer: Self-pay | Admitting: Rehabilitation

## 2020-03-08 ENCOUNTER — Ambulatory Visit: Payer: Medicaid Other | Admitting: Rehabilitation

## 2020-03-08 ENCOUNTER — Other Ambulatory Visit: Payer: Self-pay

## 2020-03-08 ENCOUNTER — Ambulatory Visit: Payer: Medicaid Other | Admitting: Occupational Therapy

## 2020-03-08 DIAGNOSIS — R278 Other lack of coordination: Secondary | ICD-10-CM

## 2020-03-08 DIAGNOSIS — M6281 Muscle weakness (generalized): Secondary | ICD-10-CM

## 2020-03-08 DIAGNOSIS — R2681 Unsteadiness on feet: Secondary | ICD-10-CM | POA: Diagnosis not present

## 2020-03-08 DIAGNOSIS — M21372 Foot drop, left foot: Secondary | ICD-10-CM

## 2020-03-08 DIAGNOSIS — R2689 Other abnormalities of gait and mobility: Secondary | ICD-10-CM

## 2020-03-08 DIAGNOSIS — R29818 Other symptoms and signs involving the nervous system: Secondary | ICD-10-CM

## 2020-03-08 DIAGNOSIS — R208 Other disturbances of skin sensation: Secondary | ICD-10-CM

## 2020-03-08 DIAGNOSIS — M21371 Foot drop, right foot: Secondary | ICD-10-CM

## 2020-03-08 NOTE — Therapy (Signed)
[facility] [address] [place], [place], [address] Phone: [phone]   Fax:  [phone]  Physical Therapy Treatment  Patient Details  Name: Lucas Cunningham MRN: [id number] Date of Birth: 07-Oct-2000 Referring Provider (PT): [name]   Encounter Date: 03/08/2020   PT End of Session - 03/08/20 1249    Visit Number 10    Number of Visits 17    Authorization Type [employer]    Authorization Time Period 16 visits 12/8-2/02/22    Authorization - Visit Number 9    Authorization - Number of Visits 16    PT Start Time 3419   pt late to session   PT Stop Time 1232    PT Time Calculation (min) 36 min    Equipment Utilized During Treatment Gait belt    Activity Tolerance Patient tolerated treatment well    Behavior During Therapy [facility] for tasks assessed/performed           Past Medical History:  Diagnosis Date  . Diabetes mellitus without complication ([place])    type 1    Past Surgical History:  Procedure Laterality Date  . NO PAST SURGERIES      There were no vitals filed for this visit.   Subjective Assessment - 03/08/20 1158    Subjective No changes since last visit, no falls.    Limitations Walking;Standing;House hold activities    Patient Stated Goals "I want to be able to bring in groceries and help around the house."    Currently in Pain? Yes    Pain Score 8    coldness that leads to pain   Pain Location Foot    Pain Orientation Right;Left    Pain Descriptors / Indicators Aching    Pain Type Neuropathic pain;Acute pain    Pain Onset More than a month ago    Pain Frequency Intermittent    Aggravating Factors  being cold    Pain Relieving Factors wrapping up in covers                             [facility] Adult PT Treatment/Exercise - 03/08/20 1209      Self-Care   Self-Care Other Self-Care Comments    Other Self-Care Comments  Provided pt with card for  [facility] to call in a day or two if he doesn't hear as PT faxed orders and MD note to them today.      Neuro Re-ed    Neuro Re-ed Details  Tall kneeling tasks to address improved core and hip activation:  mini squats x 10 reps with BUE>single UE x 8 reps witout UE support.  Progressed to decreasing UE support elevating single UE into flexion x 10 reps then without UE support holding light weight ball with elbow extended moving it upward and back to waist height x 20 reps.  Progressed to moving in diagonal pattern (ball) with emphasis on lateral weight shifting x 10 reps each direction.  Then transitioned to half kneeling elevating single UE x 10 reps, progressing to hold ball and moving laterally with elbows extended x 10 reps.  This was markedly more challenging in R half kneel due to LLE weakness.  min/guard throughout for safety.  Continued with LE and core NMR in supine with BLE bridging with LEs on large physioball x 10 reps, knee flex curls x 10 reps, combination of bridge with flex curls x 10 reps (light  assist at times for stability), modified side planks x 10 reps each side and prone on elbows planking up to knees x 10 reps.  Pt tolerated well.                    PT Short Term Goals - 02/19/20 1024      PT SHORT TERM GOAL #1   Title Pt will be IND with initial HEP in order to indicate improved function and balance (Target Date: 02/14/2020)    Baseline 02/19/20: met with current program    Status Achieved    Target Date 02/14/20      PT SHORT TERM GOAL #2   Title Will assess B AFOs and get [facility] involved as needed to decrease fall risk and improve efficiency of gait.    Baseline 02/19/20: appt has been set for Gerald Stabs with [facility] to come for brace assessment on 02/25/20    Status Achieved      PT SHORT TERM GOAL #3   Title Pt will improve gait speed to >/= 2.16 ft/sec w/ LRAD and B AFO as needed to indicate dec fall risk.    Baseline 02/19/20: 2.28 ft/sec with RW and bil PLS AFO's with  heel wedge    Time --    Period --    Status Achieved      PT SHORT TERM GOAL #4   Title Pt will improve 5TSS to </=18 secs with BUE support in order to indicate improved strength.    Baseline 02/19/20: 17.31 sec's with bil UE assist from standard height surface    Time --    Period --    Status Achieved      PT SHORT TERM GOAL #5   Title Will assess TUG and update STG/LTG as able    Baseline 02/19/20: 19.88 sec's with bil PLS AFO's with heel wedges    Time --    Period --    Status Achieved      PT SHORT TERM GOAL #6   Title Pt will be able to stand statically without support x 15 secs in order to indicate improved balance and strength.    Baseline 02/19/20: with bil PLS AFO's with heel wedges pt able to hold balance 3-5 sec's before needing UE support.    Time --    Period --    Status Not Met             PT Long Term Goals - 01/15/20 1424      PT LONG TERM GOAL #1   Title Pt will be IND with final HEP in order to indicate improved functional mobility and dec fall risk.  (Target Date: 03/15/20)    Baseline dependent    Time 8    Period Weeks    Status New    Target Date 03/15/20      PT LONG TERM GOAL #2   Title Pt will improve gait speed to >/=2.62 ft/sec with LRAD and B AFOs as needed in order to indicate safe community ambulation.    Baseline 1.56 ft/sec withRW and no AFOs    Time 8    Period Weeks    Status New      PT LONG TERM GOAL #3   Title Pt will improve 5TSS to </=16 secs with single UE support only in order to indicate improved functional strength.    Baseline 21.75 secs with BUE support    Time 8    Period Weeks  Status New      PT LONG TERM GOAL #4   Title Pt will ambulate x 500' over unlevel paved surfaces w/ LRAD and B AFOs as needed at mod I level in order to indicate safe community mobility.    Baseline unable to assess, but is fatigued with 100' indoor gait with RW    Time 8    Period Weeks    Status New      PT LONG TERM GOAL #5   Title  Pt will perform dynamic standing balance with intermittent UE support x 5 mins in order to perform ADLs safely at home.    Baseline Needs BUE support as this time, unable to stand statically without support    Time 8    Period Weeks    Status New                 Plan - 03/08/20 1249    Clinical Impression Statement Skilled session continued with NMR for LE hip and core activation in tall kneel, half kneel, supine and SL exercises.  Pt fatigues but is able to perform many exercises before needing break.    Personal Factors and Comorbidities Comorbidity 3+;Time since onset of injury/illness/exacerbation    Comorbidities see above    Examination-Activity Limitations Bathing;Dressing;Locomotion Level;Transfers;Squat;Stairs;Stand    Examination-Participation Restrictions Community Activity;Driving;School;Yard Work   Occupational hygienist Evolving/Moderate complexity    Rehab Potential Good    PT Frequency 2x / week    PT Duration 8 weeks    PT Treatment/Interventions ADLs/Self Care Home Management;DME Instruction;Gait training;Stair training;Functional mobility training;Therapeutic activities;Therapeutic exercise;Balance training;Neuromuscular re-education;Patient/family education;Orthotic Fit/Training;Passive range of motion;Vestibular    PT Next Visit Plan Goals due 1/31, send to [facility] and will recert through Feb, maybe into march. Continue to work with [place] PLS braces with quarter heel wedges.  Try elastic laces if he brings new shoes or could try with his current shoes.  Continue BLE strength, activity tolerance and balance.    PT Home Exercise Plan Follow up to see if [facility] has contacted him (they have all info)    Consulted and Agree with Plan of Care Patient           Patient will benefit from skilled therapeutic intervention in order to improve the following deficits and impairments:  Abnormal gait,Decreased activity tolerance,Decreased balance,Decreased  endurance,Decreased knowledge of precautions,Decreased knowledge of use of DME,Decreased mobility,Decreased safety awareness,Decreased strength,Difficulty walking,Impaired perceived functional ability,Impaired sensation,Impaired UE functional use,Postural dysfunction  Visit Diagnosis: Other abnormalities of gait and mobility  Muscle weakness (generalized)  Unsteadiness on feet  Foot drop, left  Foot drop, right     Problem List Patient Active Problem List   Diagnosis Date Noted  . 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 DKA, type 1 ([place]) 10/23/2019  . DKA (diabetic ketoacidosis) ([place]) 10/21/2019  . COVID-19 virus infection 10/21/2019  . Diabetic polyneuropathy associated with type 1 diabetes mellitus ([place]) 02/26/2019  . Leg weakness, bilateral 02/25/2019  . Postprandial bloating 02/25/2019  . Sensory problems with limbs 02/25/2019  . Ketonuria 11/04/2018  . Diabetes ([place]) 04/23/2018  . Uncontrolled type 1 diabetes mellitus with hyperglycemia ([place]) 05/24/2017  . Morbid childhood obesity with BMI greater than 99th percentile for age [facility]) 05/24/2017  . Insulin resistance 05/24/2017  . Acanthosis 05/24/2017    Cameron Sprang, PT, MPT [facility] [address] [place], [place], [address] Phone: [phone]   Fax:  [phone] 03/08/20, 12:52 PM  Name: Printice Hellmer MRN: [id number] Date of Birth: 2000/10/23

## 2020-03-08 NOTE — Progress Notes (Deleted)
Endocrinology provider: Dr. Quincy Sheehan (upcoming appt 03/24/2020 10:30 am)  Dietitian: [name], RD (no upcoming appt) -Previous appts on 06/10/18, 02/25/19, and 12/22/2019  Behavioral health specialist: Dr. Huntley Dec (upcoming appt 03/24/2020 1:00 pm) -No prior appt  Patient referred by Dr. Fransico Michael for diabetes education. PMH is significant for T1DM with severe insulin resistance, acanthosis, diabetic polyneuropathy, morbid obesity, prior hx of COVID-19 infection 11/11/19), and depression. Patient was lost to follow up from 02/25/19 to 12/03/19. At prior appt with Dr. Fransico Michael on 12/03/19, multiple issues were discussed. Patient states Spring/Summer 2021 he lost focus on his DM management due to how his mother passed away (now living with his grandmother). He has been guessing at his Novolog doses (not following the sliding scale plan provided by Dr Vanessa Choccolocco on 11/04/18). He has SEVERE neuropathy; he can't stand up from a sitting position without using his arms for support and he also experiences numbness and loss of strength in the palm of his right hand. He was previously followed by Dr. Devonne Doughty, but has "aged out" of that practice. He was a no show for two NP appointments at [facility], once in 2022/10/11 when his mother died and once in November 11, 2022 when hew was admitted with DKA. Dr. Fransico Michael asked the grandmother to call [facility] and obtain a new appointment for him. Patient did mention though at prior appt with Dr Fransico Michael that he wants help in managing his DM. Dr. Fransico Michael advised patient to continue Tresiba 100 units daily (200 units/mL pen) and to re-start Novolog sliding scale + fixed dose of Novolog 10 units three times daily prior to meals. Dr. Fransico Michael would like to transition patient to a 2 component plan (carb + correction factor) for rapid acting insulin in the future.   At prior appt on 03/04/20, patient changed from office visit to virtual visit due to feeling sick. He stated he had  a runny nose and sore throat. He denied abdominal pain, [place] respirations, fruity breath. He last checked his ketones on 03/02/20, which were trace. He was unable leave house right now to get COVID-19 tested due to ice on his porch. He was having issues with adherence with [place] and Novolog. He had was takiing TDD 130 units. BG were 97% very high, 3% high per Dexcom Clarity report. Increased TDD 130 --> 156 (20% increase); new basal dose was [place] U200 66 units daily and new bolus dose will be Humalog U200 30 units three times daily (10:30 AM, 3:30 PM, 6:30 PM). Continued metformin 1 g BID. Assisted patient download realarm app onto his computer to assist with adherence. Also discussed plan with grandmother who wrote instructions down to also assist with adherence. Emailed patient information about COVID-19 testing sites for when weather improved as well as at home COVID-19 testing incase weather did not improve.  School: not in school right now; planning to re-enrol in [employer] (he is unsure of time of re-enroll)  Insurance Coverage: Managed Medicaid [facility] plan; ID # [id number])  Diabetes Diagnosis: 04/23/18  Family History: T2DM (maternal grandmother, paternal grandfather); T1DM (paternal grandmother); DM (uncle)  Preferred Pharmacy [facility] [phone] - [place], [place] - [address] AT [facility] [address]  [address] [place] [address]  Phone:  [phone] Fax:  [phone]  DEA #:  [id number]  Medication Adherence -Patient denies consistent adherence adherence with [name] and Afrezza/Humalog -Current diabetes medications include: Tresiba 66 units daily (200 units/mL pen), HumalogU200 (times/dosing below), metformin  XR 500 mg daily after breakfast and 1000 mg daily after dinner i. Humalog ii. 10:30 AM: 30 units iii. 3:30 PM: 30 units iv. 6:30 PM: 30 units -Prior diabetes medications include: Afrezza (more challenging to use considering  patient's dexterity)  Injection Sites (*** changes since prior appt on 03/04/2020) -Patient-reports injection sites are right arm, abdomen, right leg --Patient reports independently injecting DM medications. --Patient reports rotating injection sites  Diet (*** changes since prior appt on 03/04/2020) Patient reported dietary habits (gets $90 in food stamps each month) Eats 2 meals/day and 1 snacks/day Breakfast (8:30-9:00 am): 3 eggs with 2 pieces of whole wheat nature's own brand toast, no [place] breast or bologna Lunch ("anytime"; 12-1PM or sometimes ~3-4PM): sandwich (mayonaise, bologna, cheese) Dinner (10-11pm): skips Snacks: no snacks  Drinks: water (24 bottle case in 2-3 days), a little bit of milk  Exercise (*** changes since prior appt on 03/04/2020) Patient-reported exercise habits: walks around house (10 min at a time, 2x daily), sometimes walk around porch; sometimes leg kicks in bed everyday for 5 min, doing exercises recommended by PT (using band, marching) does for 5 minutes each day    Dexcom Clarity Report ***   There were no vitals filed for this visit.  Lab Results  Component Value Date   HGBA1C >14 01/26/2020   HGBA1C 14.4 (H) 10/21/2019   HGBA1C 14.0 (A) 02/25/2019   HGBA1C >14 02/25/2019    Lab Results  Component Value Date   CPEPTIDE 3.8 04/23/2018       Assessment:  Patient's goal with DM management: "Get diabetes controlled as independently as possible"  Medication Management/monitoring:  ***  I am concerned for patient as I follow with him since October 2021 almost weekly and patient struggles to follow directions even though I provide written handout and/or email him a copy of instructions and he will repeat back instructions to me prior to ending appointment, however, he often forgets what he was instructed to do. He is very adamant about being independent with his care, but I feel like it is challenging for him. I wonder if lack of adherence  could possibly be due to underlying psychiatric or neurologic condition.    Plan: 2. Medications:  a. *** Tresiba U200 66 units daily b. *** Humalog U200  i. 30 units three times daily (10:30 am, 3:30 pm, 6:30 pm) c. *** metformin 1000 mg twice daily 3. Monitoring a. Continue Dexcom G6 CGM b. Provided Dexcom sensor/transmitter sample c. Quinntin Malter has a diagnosis of diabetes, checks blood glucose readings > 4x per day, treats with > 3 insulin injections, and requires frequent adjustments to insulin regimen. This patient will be seen every six months, minimally, to assess adherence to their CGM regimen and diabetes treatment plan 4. Follow Up: ***  This appointment required *** minutes of patient care (this includes precharting, chart review, review of results, virtual care, etc.).  Thank you for involving clinical pharmacist/diabetes educator to assist in providing this patient's care.  Zachery Conch, PharmD, CPP, CDCES

## 2020-03-08 NOTE — Therapy (Addendum)
[place] [address] [place], [place], [address] Phone: [phone]   Fax:  [phone]  Occupational Therapy Treatment  Patient Details  Name: Lucas Cunningham MRN: [id number] Date of Birth: 08-31-2000 Referring Provider (OT): Percell Miller, NP   Encounter Date: 03/08/2020   OT End of Session - 03/08/20 1234    Visit Number 9    Number of Visits 17    Date for OT Re-Evaluation 03/11/20    Authorization Type [employer]--awaiting auth for new year (per Pearline Cables, ok to be seen)    Authorization Time Period 27 OT/PT/ST (Week 5 of 8)    OT Start Time 1233    OT Stop Time 1315    OT Time Calculation (min) 42 min    Activity Tolerance Patient tolerated treatment well    Behavior During Therapy [facility] for tasks assessed/performed           Past Medical History:  Diagnosis Date  . Diabetes mellitus without complication ([place])    type 1    Past Surgical History:  Procedure Laterality Date  . NO PAST SURGERIES      There were no vitals filed for this visit.   Subjective Assessment - 03/08/20 1222    Subjective  Pt reports that he is making sandwiches at home    Patient is accompanied by: Family member   grandmother - waited in lobby   Pertinent History PMH Type I diabetes, depression, peripheral neuropathy    Limitations Fall Risk. Sensation deficits in RUE    Patient Stated Goals Pt wants "to be able to grab things and write and use my fingers"    Currently in Pain? No/denies            Began checking progress towards goals--see goals section below.  Pt made scrambled egg with min-mod A.  Pt demo difficulty  gathering needed items, reaching pan from low shelf, opening and pouring oil, using utensil, cracking egg with minimal RUE functional use (inconsistent stabilizer only).  Pt needed cueing for safety/balance with reaching, RW negotiation, pan placement on burner.  Pt needed assist for gathering needed  items/clean up.    AAROM  R wrist flex/ext on tabletop as able.    AAROM R wrist flex/ext with therapist facilitation/cueing for control.  Flipping large cards with R hand with min facilitation/cueing.   Pt demo decr ability to ext MPs with wrist in neutral RUE.  Attempted R lateral pinch to grasp object with facilitation.  L tip pinch with min facilitation/cues with difficulty.      OT Education - 03/08/20 1434    Education Details Recommended oven mits if able when removing items from microwave (due to sensory deficits).    Person(s) Educated Patient    Methods Explanation    Comprehension Verbalized understanding            OT Short Term Goals - 03/08/20 1235      OT SHORT TERM GOAL #1   Title Pt will be independent with initial HEP targeting grip strength and range of motion    Baseline not issued yet    Time 4    Period Weeks    Status On-going    Target Date 02/12/20      OT SHORT TERM GOAL #2   Title Pt will verbalize understanding of sensory strategies for RUE and safety    Baseline have not reviewed    Time 4    Period Weeks  Status On-going   03/08/20:  not fully met, needs reinforcement     OT SHORT TERM GOAL #3   Title Pt will increase grip strength in LUE by 5 lbs and RUE by 2 lbs for increase in functional use of BUE for ADLs and IADLs    Baseline RUE 5 LUE 29.5    Time 4    Period Weeks    Status On-going   03/08/20:  R-3.3lbs, L-27.3lb     OT SHORT TERM GOAL #4   Title Pt will perform donning and doffing socks and shoes with supervision for increase in independence with ADLs    Baseline unable to don socks consistently    Time 4    Period Weeks    Status On-going   03/08/20:  unable to put socks on, but able to put on worn shoes     OT SHORT TERM GOAL #5   Title Pt will perform simple cold meal prep (sandwich) and/or light housekeeping with supervision    Baseline not completing at this time    Time 4    Period Weeks    Status On-going    03/08/20:  Pt reports that he has warmed things in microwave or sandwich, straightening room            OT Long Term Goals - 01/15/20 1505      OT LONG TERM GOAL #1   Title Pt will be independent with updated HEP 03/11/2020    Baseline not issued yet    Time 8    Period Weeks    Status New    Target Date 03/11/20      OT LONG TERM GOAL #2   Title Pt will perform simple warm meal prep and/or light house keeping task with mod I and good safety awareness    Baseline not completing at eval    Time 8    Period Weeks    Status New      OT LONG TERM GOAL #3   Title Pt will improve grip strengthening in LUE by 10 lbs and RUE by 6 lbs for increased ability to perform clothing management for ADLs    Baseline LUE 29.5 RUE 5    Time 8    Period Weeks    Status New      OT LONG TERM GOAL #4   Title Pt will increased box and blocks score in RUE to 12 blocks or more for increase in functional use of RUE    Baseline RUE 6    Time 8    Period Weeks    Status New      OT LONG TERM GOAL #5   Title Pt will report decrease pain in RUE wrist with functional use to no more than 6/10    Baseline 9/10    Time 8    Period Weeks    Status New      OT LONG TERM GOAL #6   Title Pt will complete basic ADLs mod Independent with adapted equipment and strategies PRN    Baseline difficulty with donning socks, cutting up food and fasteners    Time 8    Period Weeks    Status New                 Plan - 03/08/20 1235    Clinical Impression Statement Pt making slow progress. Pt continues to present with limited wrist extension and severe neuropathy in RUE affecting functional use  for bilateral or unilateral tasks.  Pt needed min-mod A for simple cooking task.    OT Occupational Profile and History Problem Focused Assessment - Including review of records relating to presenting problem    Occupational performance deficits (Please refer to evaluation for details): IADL's;ADL's;Leisure;Social  Participation    Body Structure / Function / Physical Skills ADL;Balance;Coordination;Decreased knowledge of use of DME;Edema;Flexibility;FMC;Dexterity;Strength;GMC;Pain;Tone;UE functional use;ROM;IADL;Sensation    Psychosocial Skills Coping Strategies    Rehab Potential Good    Clinical Decision Making Limited treatment options, no task modification necessary    Comorbidities Affecting Occupational Performance: None    Modification or Assistance to Complete Evaluation  No modification of tasks or assist necessary to complete eval    OT Frequency 2x / week    OT Duration 8 weeks    OT Treatment/Interventions Self-care/ADL training;Moist Heat;DME and/or AE instruction;Splinting;Balance training;Therapeutic activities;Aquatic Therapy;Therapeutic exercise;Ultrasound;Electrical Stimulation;Cryotherapy;Neuromuscular education;Paraffin;Energy conservation;Manual Therapy;Patient/family education;Passive range of motion;Functional Mobility Training    Plan ?R long opponens splint for incr RUE functional use, coordination/functional use HEP bilateral hands and tip pinch L hand    Recommended Other Services Pt is receiving PT    Consulted and Agree with Plan of Care Patient;Family member/caregiver    Family Member Consulted grandmother           Patient will benefit from skilled therapeutic intervention in order to improve the following deficits and impairments:   Body Structure / Function / Physical Skills: ADL,Balance,Coordination,Decreased knowledge of use of DME,Edema,Flexibility,FMC,Dexterity,Strength,GMC,Pain,Tone,UE functional use,ROM,IADL,Sensation   Psychosocial Skills: Coping Strategies   Visit Diagnosis: Other symptoms and signs involving the nervous system  Other lack of coordination  Other disturbances of skin sensation  Muscle weakness (generalized)    Problem List Patient Active Problem List   Diagnosis Date Noted  . 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 DKA, type 1 ([place]) 10/23/2019  . DKA (diabetic ketoacidosis) ([place]) 10/21/2019  . COVID-19 virus infection 10/21/2019  . Diabetic polyneuropathy associated with type 1 diabetes mellitus ([place]) 02/26/2019  . Leg weakness, bilateral 02/25/2019  . Postprandial bloating 02/25/2019  . Sensory problems with limbs 02/25/2019  . Ketonuria 11/04/2018  . Diabetes ([place]) 04/23/2018  . Uncontrolled type 1 diabetes mellitus with hyperglycemia ([place]) 05/24/2017  . Morbid childhood obesity with BMI greater than 99th percentile for age [facility]) 05/24/2017  . Insulin resistance 05/24/2017  . Acanthosis 05/24/2017    [facility] 03/08/2020, 4:47 PM  [place] [address] [place], [place], [address] Phone: [phone]   Fax:  [phone]  Name: Lucas Cunningham MRN: [id number] Date of Birth: 15-Apr-2000   Vianne Bulls, OTR/L [facility] [address] [place], [place]  [address] [phone] phone [phone] 03/08/20 4:47 PM

## 2020-03-10 ENCOUNTER — Ambulatory Visit: Payer: Medicaid Other | Admitting: Occupational Therapy

## 2020-03-10 ENCOUNTER — Ambulatory Visit: Payer: Medicaid Other | Admitting: Physical Therapy

## 2020-03-10 ENCOUNTER — Ambulatory Visit: Payer: Medicaid Other | Admitting: Rehabilitation

## 2020-03-10 ENCOUNTER — Telehealth (INDEPENDENT_AMBULATORY_CARE_PROVIDER_SITE_OTHER): Payer: Self-pay | Admitting: Pharmacist

## 2020-03-10 NOTE — Telephone Encounter (Signed)
  Who's calling (name and relationship to patient) : Redmond Pulling   Best contact number:[phone]  Provider they see: Dr. Ladona Ridgel  Reason for call: Parent simply told me she wanted to speak to Dr. Ladona Ridgel regarding the patients health and she would like  Call back     PRESCRIPTION REFILL ONLY  Name of prescription:  Pharmacy:

## 2020-03-10 NOTE — Telephone Encounter (Signed)
Returned call to grandmother, no answer, left HIPAA approved voicemail for return call.

## 2020-03-11 ENCOUNTER — Ambulatory Visit (INDEPENDENT_AMBULATORY_CARE_PROVIDER_SITE_OTHER): Payer: Medicaid Other | Admitting: Neurology

## 2020-03-11 ENCOUNTER — Ambulatory Visit: Payer: Medicaid Other | Admitting: Rehabilitation

## 2020-03-11 ENCOUNTER — Ambulatory Visit: Payer: Medicaid Other | Admitting: Neurology

## 2020-03-11 ENCOUNTER — Encounter (INDEPENDENT_AMBULATORY_CARE_PROVIDER_SITE_OTHER): Payer: Medicaid Other

## 2020-03-11 DIAGNOSIS — M21371 Foot drop, right foot: Secondary | ICD-10-CM

## 2020-03-11 DIAGNOSIS — M21372 Foot drop, left foot: Secondary | ICD-10-CM | POA: Diagnosis not present

## 2020-03-11 DIAGNOSIS — G6181 Chronic inflammatory demyelinating polyneuritis: Secondary | ICD-10-CM | POA: Diagnosis not present

## 2020-03-11 DIAGNOSIS — R29898 Other symptoms and signs involving the musculoskeletal system: Secondary | ICD-10-CM | POA: Diagnosis not present

## 2020-03-11 DIAGNOSIS — Z0289 Encounter for other administrative examinations: Secondary | ICD-10-CM

## 2020-03-11 DIAGNOSIS — E1042 Type 1 diabetes mellitus with diabetic polyneuropathy: Secondary | ICD-10-CM

## 2020-03-11 NOTE — Telephone Encounter (Signed)
Returned call.  Lucas Cunningham's grandmother called as she was concerned that Lucas Cunningham got his COVID-19 vaccine and felt poorly afterwards. Explained this is normal response from COVID-19 vaccine and he should feel better within 1-3 days. Grandmother confirms he is already feeling better.  Will see them tomorrow for appt.  Thank you for involving clinical pharmacist/diabetes educator to assist in providing this patient's care.   Zachery Conch, PharmD, CPP, CDCES

## 2020-03-11 NOTE — Patient Instructions (Addendum)
Please call [phone] to schedule MRIs and your Lumbar Puncture We will try to get a treatment called IVIG approved for you  Magnetic Resonance Imaging Magnetic resonance imaging (MRI) is a painless test that takes pictures of the inside of your body. This test uses a strong magnet. It does not use X-rays. An MRI can show more details about a medical problem than other tests. Tell a health care provider about:  Any allergies you have.  All medicines you are taking. This includes vitamins, herbs, eye drops, creams, and over-the-counter medicines.  Any surgeries you have had.  Any medical problems you have.  Any metal you may have in your body. This includes: ? Any new joint, such as a man-made (artificial) knee or hip. ? Any implanted devices, such as a pacemaker. ? An ear implant with metal (cochlear implant). ? An artificial heart valve. ? An object in your eye that has metal. ? Metal splinters. ? Pieces of a bullet. ? A port for insulin or chemotherapy.  Any tattoos you have.  If you have a birth control implant, such as an IUD.  Whether you are pregnant, may be pregnant, or are breastfeeding.  Any fear of small spaces (claustrophobia). You may be given a medicine to help you relax. What are the risks? Generally, this is a safe test. However, problems may occur. These include:  If you have metal in your body near the area being tested, it may be hard to get clear pictures.  If you are pregnant, you should avoid MRI tests during the first three months of pregnancy.  If you are breastfeeding and dye will be used during your test, you may need to stop until the dye leaves your body.  If dye is used, there is a risk of an allergic reaction to the dye. You can take medicines to prevent this reaction or to treat it if you have symptoms.  If dye is used, it can cause damage to your kidneys. Drinking plenty of water before and after the test can help prevent this. What  happens before the test?  You will be asked to take off all metal. This includes: ? Your watch, jewelry, and other metal things. ? Hearing aids. ? Dentures. ? An underwire bra. ? Makeup.  Braces and dental fillings normally are not a problem.  If you are breastfeeding, ask your doctor if you need to pump before your test. You may need to stop breastfeeding for a time if dye will be used. What happens during the test?  You may be given earplugs or headphones to listen to music. The MRI machine can be noisy.  You will lie down on a table.  If dye will be used, an IV tube will be placed into one of your veins. Dye will be given through your IV tube.  The table will slide into a tunnel. The tunnel has magnets inside of it. When you are inside the tunnel, you will still be able to talk to your doctor.  The tunnel will scan your body and make images. You will be asked to lie very still. Your doctor will tell you when you can move.  When all images are taken, the table will slide out of the tunnel. The test can take 30 minutes to over an hour. The test may vary among doctors and hospitals.   What can I expect after test?  If you were given a medicine to help you relax, you may be monitored  until you leave the hospital or clinic. This includes checking your blood pressure, heart rate, breathing rate, and blood oxygen level.  If dye was used: ? It will leave your body through your pee (urine). This takes about a day. ? You may be told to drink plenty of fluids. This helps your body get rid of the dye. ? Do not breastfeed your child until your doctor says that this is safe. Follow these instructions at home:  You may go back to your normal activities right away, or as told by your doctor.  It is up to you to get your test results. Ask how to get your results when they are ready.  Keep all follow-up visits. Summary  Magnetic resonance imaging (MRI) is a painless test that takes  pictures of the inside of your body.  Dye may be used to get MRI pictures that are even more clear.  Before your MRI, be sure to tell your doctor about any metal you may have in your body.  Talk with your doctor about what your test results mean. This information is not intended to replace advice given to you by your health care provider. Make sure you discuss any questions you have with your health care provider. Document Revised: 06/04/2019 Document Reviewed: 06/04/2019 Elsevier Patient Education  2021 Else[employer] Inc.  Lumbar Puncture A lumbar puncture, also called a spinal tap, is a procedure that is done to remove a small amount of the fluid that surrounds the brain and spinal cord (cerebrospinal fluid, CSF). The fluid is then examined in the lab. This procedure may be done to:  Help diagnose various problems, such as meningitis, encephalitis, multiple sclerosis, and other infections.  Remove fluid and relieve pressure that occurs with certain types of headaches.  Look for bleeding within the brain and spinal cord areas (central nervous system).  Place medicine into the spinal fluid. Tell a health care provider about:  Any allergies you have.  All medicines you are taking, including vitamins, herbs, eye drops, creams, and over-the-counter medicines like aspirin or NSAIDs.  Any problems you or family members have had with anesthetic medicines.  Any blood disorders you have.  Any surgeries you have had.  Any medical conditions you have.  Whether you are pregnant or may be pregnant. What are the risks? Generally, this is a safe procedure. However, problems may occur, including:  Infection at the insertion site that can spread to the bone or spinal fluid.  Bleeding.  Spinal headache. This is a severe headache that occurs when there is a leak of spinal fluid.  Leakage of CSF.  Allergic reactions to medicines or dyes.  Damage to other structures or organs. What happens  before the procedure? Staying hydrated Follow instructions from your health care provider about hydration, which may include:  Up to 2 hours before the procedure - you may continue to drink clear liquids, such as water, clear fruit juice, black coffee, and plain tea. Eating and drinking restrictions Follow instructions from your health care provider about eating and drinking. If you will be given a medicine to help you relax (sedative), these instructions may include:  8 hours before the procedure - stop eating heavy meals or foods such as meat, fried foods, or fatty foods.  6 hours before the procedure - stop eating light meals or foods, such as toast or cereal.  6 hours before the procedure - stop drinking milk or drinks that contain milk.  2 hours before the procedure -  stop drinking clear liquids. Medicines  Ask your health care provider about: ? Changing or stopping your regular medicines. This is especially important if you are taking diabetes medicines or blood thinners. ? Taking medicines such as aspirin and ibuprofen. These medicines can thin your blood. Do not take these medicines before your procedure if your health care provider instructs you not to.  You may be given antibiotic medicine to help prevent infection. If so, take the antibiotic as told by your health care provider. General instructions  You may have a blood sample taken.  You may be asked to shower with a germ-killing soap.  Ask your health care provider how your surgical site will be marked or identified.  Plan to have someone take you home from the hospital or clinic. This is especially important if you will be given a sedative.  If you will be going home right after the procedure, plan to have someone with you for 24 hours. What happens during the procedure?  You may lie down on your side with your knees bent, or you may sit with your head resting on a pillow on your lap. ? How you are positioned  depends on your age and size. ? You will be positioned so that the spaces between the bones of the spine (vertebrae) are as wide as possible. This will make it easier to pass the needle into the spinal canal.  The skin on your lower back (lumbar region) will be cleaned.  You will be given an injection of medicine to numb your lower back area (local anesthetic).  You may be given pain medicine or a sedative.  A small needle will be inserted into your lower back until it enters the space that contains the cerebrospinal fluid. The needle will not enter the spinal cord.  Fluid will be collected into tubes. It will be sent to a lab for examination.  The needle will be withdrawn, and a bandage (dressing) will be placed over the area. The procedure may vary among health care providers and hospitals.   What happens after the procedure?  Your blood pressure, heart rate, breathing rate, and blood oxygen level will be monitored until any medicines you were given have worn off.  You may need to stay lying down for a while.  You will need to drink plenty of fluids and caffeine to help prevent a headache.  Do not drive for 24 hours if you received a sedative. Summary  A lumbar puncture, also called a spinal tap, is a procedure that is done to remove a small amount of the cerebrospinal fluid, CSF. This may be done to help diagnose a wide variety of conditions.  Before the procedure, tell your health care provider about all medicines you are taking, including vitamins, herbs, eye drops, creams, and over-the-counter medicines.  Before the procedure, ask your health care provider about changing or stopping your regular medicines. This is especially important if you are taking blood thinners.  Your lower back will be numbed with an injection before the needle is placed into your spinal canal.  After the procedure, you will lie down for a while and you will drink plenty of fluids. This information is  not intended to replace advice given to you by your health care provider. Make sure you discuss any questions you have with your health care provider. Document Revised: 03/15/2016 Document Reviewed: 03/15/2016 Elsevier Patient Education  2021 [employer].

## 2020-03-11 NOTE — Progress Notes (Signed)
See procedure note.

## 2020-03-12 ENCOUNTER — Ambulatory Visit (INDEPENDENT_AMBULATORY_CARE_PROVIDER_SITE_OTHER): Payer: Medicaid Other | Admitting: Pediatrics

## 2020-03-12 ENCOUNTER — Ambulatory Visit (INDEPENDENT_AMBULATORY_CARE_PROVIDER_SITE_OTHER): Payer: Medicaid Other | Admitting: "Endocrinology

## 2020-03-12 ENCOUNTER — Other Ambulatory Visit (INDEPENDENT_AMBULATORY_CARE_PROVIDER_SITE_OTHER): Payer: Medicaid Other | Admitting: Pharmacist

## 2020-03-14 NOTE — Progress Notes (Signed)
Full Name: Lucas Cunningham Gender: Male MRN #: [id number] Date of Birth: 2000-11-27    Visit Date: 03/11/2020 08:34 Age: 20 Years Examining Physician: Naomie Dean, MD  Requesting Provider: Keturah Shavers, MD Primary Care Provider:  Jonette Pesa, NP  History: Progressive muscle weakness    Summary: Nerve Conduction studies were performed on all 4 extremities. EMG needle study was performed on the right upper and right lower extremities.  The right median motor nerve showed delayed distal onset latency (6.9 ms, normal less than 4.4) and reduced amplitude (0.1 mV, normal greater than 4) and decreased conduction velocity (46m/s, N>49).   The left median motor nerve showed delayed distal onset latency (6.0 ms, normal less than 4.4) and reduced amplitude (0.4 mV, normal greater than 4) and decreased conduction velocity (74m/s, N>49). The right ulnar motor nerve showed decreased conduction velocity (below elbow to wrist, 10 m/s, normal greater than 49) and decreased conduction velocity (above elbow to below elbow, 13 m/s, normal greater than 49).  The right and left peroneal motor conduction showed no response.  The right and left tibial motor nerve showed no response.  The right radial motor nerve showed delayed distal onset latency (4.2 ms, normal less than 2.9) and reduced amplitude (0.4 V, normal greater than 2). The left radial motor nerve showed delayed distal onset latency (3.7 ms, normal less than 2.9).  The right radial sensory nerve showed decreased amplitude (9 V, normal greater than 15).  The right median orthodromic sensory nerve showed delayed distal peak latency (3.9 ms, normal less than 3.4) and decreased amplitude (7 V, normal greater than 10). The left median orthodromic sensory nerve showed delayed distal peak latency (3.6 ms, normal less than 3.4) and decreased amplitude (5 V, normal greater than 10).  The right median F wave showed delayed latency (42.6 ms,  normal less than 31).  The right ulnar F wave showed delayed latency (57.5 ms, normal less than 32).  The left median F wave showed delayed latency (34.8 ms, normal less than 31).  The left ulnar F wave showed delayed latency (35.3 ms, normal less than 32).  All remaining nerves (as indicated in the following tables) were within normal limits. The right extensor digitorum communis, right extensor indicis proprius, right first dorsal interosseous, right tibialis anterior, right gastrocnemius, right extensor hallucis longus showed increased spontaneous activity.  The right pronator teres and right flexor carpi radialis showed increased spontaneous activity and diminished motor unit recruitment. All remaining muscles (as indicated in the following tables) were within normal limits.      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  Evidence of primary demyelination is present. The acute/ongoing denervation and chronic neurogenic changes seen on EMG are consistent with secondary axonal changes. This fulfills the electrodiagnostic criteria for CIDP.   ------------------------------- Naomie Dean, M.D.  [facility] [address], [address] [place], [place] [address] Tel: [phone] Fax: [phone]  Verbal informed consent was obtained from the patient, patient was informed of potential risk of procedure, including bruising, bleeding, hematoma formation, infection, muscle weakness, muscle pain, numbness, among others.        MNC    Nerve / Sites Muscle Latency Ref. Amplitude Ref. Rel Amp Segments Distance Velocity Ref. Area    ms ms mV mV %  cm m/s m/s mVms  R Median - APB     Wrist APB 6.9 ?4.4 0.1 ?4.0 100 Wrist - APB  7   0.5     Upper arm APB 32.8  0.0  31.5 Upper arm - Wrist 27 10 ?49 0.2  L Median - APB     Wrist APB 6.0 ?4.4 0.4 ?4.0 100 Wrist - APB 7   1.1     Upper arm APB 26.1  0.6  140 Upper arm - Wrist 26 13 ?49 2.1   R Ulnar - ADM     Wrist ADM 3.1 ?3.3 10.4 ?6.0 100 Wrist - ADM 7   37.5     B.Elbow ADM 27.2  0.1  1.29 B.Elbow - Wrist 23 10 ?49 0.3     A.Elbow ADM 33.4  0.1  83 A.Elbow - B.Elbow 8 13 ?49 0.3  L Ulnar - ADM     Wrist ADM 2.8 ?3.3 9.5 ?6.0 100 Wrist - ADM 7   32.0     B.Elbow ADM 7.4  5.5  57.9 B.Elbow - Wrist 25 55 ?49 20.6     A.Elbow ADM 9.2  5.2  94.4 A.Elbow - B.Elbow 10 55 ?49 19.6  R Peroneal - EDB     Ankle EDB NR ?6.5 NR ?2.0 NR Ankle - EDB 9   NR     Fib head EDB NR  NR  NR Fib head - Ankle 32 NR ?44 NR  L Peroneal - EDB     Ankle EDB NR ?6.5 NR ?2.0 NR Ankle - EDB 9   NR     Fib head EDB NR  NR  NR Fib head - Ankle 30 NR ?44 NR  R Tibial - AH     Ankle AH NR ?5.8 NR ?4.0 NR Ankle - AH 9 NR  NR  L Tibial - AH     Ankle AH NR ?5.8 NR ?4.0 NR Ankle - AH 9 NR  NR  R Radial - EIP     Forearm EIP 4.2 ?2.9 0.4 ?2.0 100 Forearm - EIP 4  ?49 1.9     Elbow EIP NR  NR  NR Elbow - Forearm  NR  NR     Spiral Gr EIP NR  NR  NR Spiral Gr - Elbow  NR  NR  L Radial - EIP     Forearm EIP 3.7 ?2.9 2.7 ?2.0 100 Forearm - EIP 4  ?49 10.1     Elbow EIP 7.4  2.7  99.4 Elbow - Forearm 17 46  15.9     Spiral Gr EIP 11.5  1.6  60 Spiral Gr - Elbow 17 41  9.1                          SNC    Nerve / Sites Rec. Site Peak Lat Ref.  Amp Ref. Segments Distance Peak Diff Ref.    ms ms V V  cm ms ms  R Radial - Anatomical snuff box (Forearm)     Forearm Wrist 2.8 ?2.9 9 ?15 Forearm - Wrist 10    L Radial - Anatomical snuff box (Forearm)     Forearm Wrist 2.4 ?2.9 23 ?15 Forearm - Wrist 10    R Sural - Ankle (Calf)     Calf Ankle 3.4 ?4.4 7 ?6 Calf - Ankle 14    L Sural - Ankle (Calf)     Calf Ankle 3.3 ?4.4 6 ?6 Calf - Ankle 14    R Superficial peroneal - Ankle     Lat leg  Ankle 3.3 ?4.4 6 ?6 Lat leg - Ankle 14    L Superficial peroneal - Ankle     Lat leg Ankle 3.6 ?4.4 6 ?6 Lat leg - Ankle 14    R Median, Ulnar - Transcarpal comparison     Median Palm Wrist 2.8 ?2.2 16 ?35 Median Palm -  Wrist 8       Ulnar Palm Wrist 2.6 ?2.2 12 ?12 Ulnar Palm - Wrist 8          Median Palm - Ulnar Palm  0.2 ?0.4  R Median - Orthodromic (Dig II, Mid palm)     Dig II Wrist 3.9 ?3.4 7 ?10 Dig II - Wrist 13    L Median - Orthodromic (Dig II, Mid palm)     Dig II Wrist 3.6 ?3.4 5 ?10 Dig II - Wrist 13    R Ulnar - Orthodromic, (Dig V, Mid palm)     Dig V Wrist 3.1 ?3.1 9 ?5 Dig V - Wrist 11    L Ulnar - Orthodromic, (Dig V, Mid palm)     Dig V Wrist 3.1 ?3.1 7 ?5 Dig V - Wrist 58                             F  Wave    Nerve F Lat Ref.   ms ms  R Median - APB 42.6 ?31.0  R Ulnar - ADM 57.5 ?32.0  L Median - APB 34.8 ?31.0  L Ulnar - ADM 35.3 ?32.0             EMG Summary Table    Spontaneous MUAP Recruitment  Muscle IA Fib PSW Fasc Other Amp Dur. Poly Pattern  R. Deltoid Normal None None None _______ Normal Normal Normal Normal  R. Triceps brachii Normal None None None _______ Normal Normal Normal Normal  R. Extensor digitorum communis Normal None 3+ None _______    No Activity  R. Extensor indicis proprius Normal None 3+ None _______      R. Brachioradialis Normal None None None _______      R. Pronator teres Normal None 2+ None _______ Normal Normal Normal Reduced  R. Flexor carpi radialis Normal None 2+ None _______ Normal Normal Normal Reduced  R. First dorsal interosseous Normal None 3+ None _______    No Activity  R. Iliopsoas Normal None None None _______ Normal Normal Normal Normal  R Iliopsoas Normal None None None _______ Normal Normal Normal Normal  R. Vastus medialis Normal None None None _______ Normal Normal Normal Normal  R. Tibialis anterior Normal None 3+ None _______    No Activity  R. Gastrocnemius (Medial head) Normal None 3+ None _______    No Activity  R. Extensor hallucis longus N ormal None 3+ None _______    No Activity  R Biceps femoris (long head) Normal None None None _______ Normal Normal Normal Normal  R Cervical paraspinals (low) Normal None None None  _______ Normal Normal Normal Normal  R Gluteus maximus Normal None None None _______ Normal Normal Normal Normal  R Gluteus medius Normal None None None _______ Normal Normal Normal Normal  R Lumbar paraspinals (low) Normal None None None _______ Normal Normal Normal Normal

## 2020-03-14 NOTE — Progress Notes (Deleted)
  Endocrinology provider: Dr. Meehan (upcoming appt 03/24/2020 10:30 am)  Dietitian: Katherine Mikelaites, RD (no upcoming appt) -Previous appts on 06/10/18, 02/25/19, and 12/22/2019  Behavioral health specialist: Dr. Cupito (upcoming appt 03/24/2020 1:00 pm) -No prior appt  Patient referred by Dr. Brennan for diabetes education. PMH is significant for T1DM with severe insulin resistance, acanthosis, diabetic polyneuropathy, morbid obesity, prior hx of COVID-19 infection (10/2019), and depression. Patient was lost to follow up from 02/25/19 to 12/03/19. At prior appt with Dr. Brennan on 12/03/19, multiple issues were discussed. Patient states Spring/Summer 2021 he lost focus on his DM management due to how his mother passed away (now living with his grandmother). He has been guessing at his Novolog doses (not following the sliding scale plan provided by Dr Badik on 11/04/18). He has SEVERE neuropathy; he can't stand up from a sitting position without using his arms for support and he also experiences numbness and loss of strength in the palm of his right hand. He was previously followed by Dr. Nabizadeh, but has "aged out" of that practice. He was a no show for two NP appointments at [facility], once in August when his mother died and once in September when hew was admitted with DKA. Dr. Brennan asked the grandmother to call [facility] and obtain a new appointment for him. Patient did mention though at prior appt with Dr Brennan that he wants help in managing his DM. Dr. Brennan advised patient to continue Tresiba 100 units daily (200 units/mL pen) and to re-start Novolog sliding scale + fixed dose of Novolog 10 units three times daily prior to meals. Dr. Brennan would like to transition patient to a 2 component plan (carb + correction factor) for rapid acting insulin in the future.   At prior appt on 03/04/20, patient changed from office visit to virtual visit due to feeling sick. He stated he had  a runny nose and sore throat. He denied abdominal pain, Kussamaul respirations, fruity breath. He last checked his ketones on 03/02/20, which were trace. He was unable leave house right now to get COVID-19 tested due to ice on his porch. He was having issues with adherence with Tresiba and Novolog. He had was takiing TDD 130 units. BG were 97% very high, 3% high per Dexcom Clarity report. Increased TDD 130 --> 156 (20% increase); new basal dose was Tresiba U200 66 units daily and new bolus dose will be Humalog U200 30 units three times daily (10:30 AM, 3:30 PM, 6:30 PM). Continued metformin 1 g BID. Assisted patient download realarm app onto his computer to assist with adherence. Also discussed plan with grandmother who wrote instructions down to also assist with adherence. Emailed patient information about COVID-19 testing sites for when weather improved as well as at home COVID-19 testing incase weather did not improve.  School: not in school right now; planning to re-enrol in [employer] (he is unsure of time of re-enroll)  Insurance Coverage: Managed Medicaid ([employer] plan; ID # [id number])  Diabetes Diagnosis: 04/23/18  Family History: T2DM (maternal grandmother, paternal grandfather); T1DM (paternal grandmother); DM (uncle)  Preferred Pharmacy [id number] - [place], [place] - [address]  [address], [place] [address]  Phone:  [phone] Fax:  [phone]  DEA #:  [id number]  Medication Adherence -Patient denies consistent adherence adherence with Tresiba and Afrezza/Humalog -Current diabetes medications include: Tresiba 66 units daily (200 units/mL pen), HumalogU200 (times/dosing below), metformin   XR 500 mg daily after breakfast and 1000 mg daily after dinner i. Humalog ii. 10:30 AM: 30 units iii. 3:30 PM: 30 units iv. 6:30 PM: 30 units -Prior diabetes medications include: Afrezza (more challenging to use considering  patient's dexterity)  Injection Sites (*** changes since prior appt on 03/04/2020) -Patient-reports injection sites are right arm, abdomen, right leg --Patient reports independently injecting DM medications. --Patient reports rotating injection sites  Diet (*** changes since prior appt on 03/04/2020) Patient reported dietary habits (gets $90 in food stamps each month) Eats 2 meals/day and 1 snacks/day Breakfast (8:30-9:00 am): 3 eggs with 2 pieces of whole wheat nature's own brand toast, no [place] breast or bologna Lunch ("anytime"; 12-1PM or sometimes ~3-4PM): sandwich (mayonaise, bologna, cheese) Dinner (10-11pm): skips Snacks: no snacks  Drinks: water (24 bottle case in 2-3 days), a little bit of milk  Exercise (*** changes since prior appt on 03/04/2020) Patient-reported exercise habits: walks around house (10 min at a time, 2x daily), sometimes walk around porch; sometimes leg kicks in bed everyday for 5 min, doing exercises recommended by PT (using band, marching) does for 5 minutes each day    Dexcom Clarity Report ***   There were no vitals filed for this visit.  Lab Results  Component Value Date   HGBA1C >14 01/26/2020   HGBA1C 14.4 (H) 10/21/2019   HGBA1C 14.0 (A) 02/25/2019   HGBA1C >14 02/25/2019    Lab Results  Component Value Date   CPEPTIDE 3.8 04/23/2018       Assessment:  Patient's goal with DM management: "Get diabetes controlled as independently as possible"  Medication Management/monitoring:  ***  I am concerned for patient as I follow with him since October 2021 almost weekly and patient struggles to follow directions even though I provide written handout and/or email him a copy of instructions and he will repeat back instructions to me prior to ending appointment, however, he often forgets what he was instructed to do. He is very adamant about being independent with his care, but I feel like it is challenging for him. I wonder if lack of adherence  could possibly be due to underlying psychiatric or neurologic condition.    Plan: 2. Medications:  a. *** Tresiba U200 66 units daily b. *** Humalog U200  i. 30 units three times daily (10:30 am, 3:30 pm, 6:30 pm) c. *** metformin 1000 mg twice daily 3. Monitoring a. Continue Dexcom G6 CGM b. Provided Dexcom sensor/transmitter sample c. Lucas Cunningham has a diagnosis of diabetes, checks blood glucose readings > 4x per day, treats with > 3 insulin injections, and requires frequent adjustments to insulin regimen. This patient will be seen every six months, minimally, to assess adherence to their CGM regimen and diabetes treatment plan 4. Follow Up: ***  This appointment required *** minutes of patient care (this includes precharting, chart review, review of results, virtual care, etc.).  Thank you for involving clinical pharmacist/diabetes educator to assist in providing this patient's care.  Zachery Conch, PharmD, CPP, CDCES

## 2020-03-14 NOTE — Progress Notes (Unsigned)
Interval history March 11, 2020: Patient is here with his grandmother for evaluation.  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  I gave them literature to read.  I spent over 40  minutes of face-to-face and non-face-to-face time with patient on the  1. Bilateral foot-drop   2. Right arm weakness    diagnosis.  This included previsit chart review, lab review, study review, order entry, electronic health record documentation, patient education on the different diagnostic and therapeutic options, counseling and coordination of care, risks and benefits of management, compliance, or risk factor reduction.  This does not include time spent on EMG nerve conduction study.

## 2020-03-15 ENCOUNTER — Other Ambulatory Visit: Payer: Self-pay

## 2020-03-15 ENCOUNTER — Ambulatory Visit
Admission: RE | Admit: 2020-03-15 | Discharge: 2020-03-15 | Disposition: A | Discharge status: Home / Self Care | Payer: Medicaid Other | Source: Ambulatory Visit | Attending: Neurology | Admitting: Neurology

## 2020-03-15 DIAGNOSIS — G6181 Chronic inflammatory demyelinating polyneuritis: Secondary | ICD-10-CM

## 2020-03-15 DIAGNOSIS — M21371 Foot drop, right foot: Secondary | ICD-10-CM

## 2020-03-15 DIAGNOSIS — M21331 Wrist drop, right wrist: Secondary | ICD-10-CM

## 2020-03-15 DIAGNOSIS — R29898 Other symptoms and signs involving the musculoskeletal system: Secondary | ICD-10-CM

## 2020-03-15 DIAGNOSIS — G1229 Other motor neuron disease: Secondary | ICD-10-CM

## 2020-03-15 DIAGNOSIS — R531 Weakness: Secondary | ICD-10-CM | POA: Diagnosis not present

## 2020-03-15 MED ORDER — GADOBENATE DIMEGLUMINE 529 MG/ML IV SOLN
20.0000 mL | Freq: Once | INTRAVENOUS | Status: AC | PRN
  Administered 2020-03-15: 20 mL via INTRAVENOUS

## 2020-03-16 ENCOUNTER — Telehealth: Payer: Self-pay | Admitting: *Deleted

## 2020-03-16 ENCOUNTER — Other Ambulatory Visit (INDEPENDENT_AMBULATORY_CARE_PROVIDER_SITE_OTHER): Payer: Medicaid Other | Admitting: Pharmacist

## 2020-03-16 NOTE — Telephone Encounter (Signed)
-----   Message from Anson Fret, MD sent at 03/15/2020  5:20 PM EST ----- The brain looks fine. He has an LP and 2 more MRIs pending thanks

## 2020-03-16 NOTE — Telephone Encounter (Signed)
Called pt. LVM asking for call back. If pt or Venetia (on DPR) call back, please advise that his MRI of brain looks fine. We will call when the other upcoming tests have resulted.

## 2020-03-17 ENCOUNTER — Other Ambulatory Visit: Payer: Self-pay

## 2020-03-17 ENCOUNTER — Other Ambulatory Visit (HOSPITAL_COMMUNITY)
Admission: RE | Admit: 2020-03-17 | Discharge: 2020-03-17 | Disposition: A | Discharge status: Home / Self Care | Payer: Medicaid Other | Source: Ambulatory Visit | Attending: Neurology | Admitting: Neurology

## 2020-03-17 ENCOUNTER — Ambulatory Visit
Admission: RE | Admit: 2020-03-17 | Discharge: 2020-03-17 | Disposition: A | Discharge status: Home / Self Care | Payer: Medicaid Other | Source: Ambulatory Visit | Attending: Neurology | Admitting: Neurology

## 2020-03-17 VITALS — BP 135/65 | HR 81

## 2020-03-17 DIAGNOSIS — G6181 Chronic inflammatory demyelinating polyneuritis: Secondary | ICD-10-CM

## 2020-03-17 NOTE — Progress Notes (Signed)
65ml of blood drawn from pts left hand for LP blood work. 1 successful attempt, pt tolerated well. Gauze and tape applied after hemostasis achieved.

## 2020-03-17 NOTE — Discharge Instructions (Signed)

## 2020-03-18 LAB — CYTOLOGY - NON PAP

## 2020-03-21 NOTE — Progress Notes (Deleted)
  Endocrinology provider: Dr. Meehan (upcoming appt 03/24/2020 10:30 am)  Dietitian: Katherine Mikelaites, RD (no upcoming appt) -Previous appts on 06/10/18, 02/25/19, and 12/22/2019  Behavioral health specialist: Dr. Cupito (upcoming appt 03/24/2020 1:00 pm) -No prior appt  Patient referred by Dr. Brennan for diabetes education. PMH is significant for T1DM with severe insulin resistance, acanthosis, diabetic polyneuropathy, morbid obesity, prior hx of COVID-19 infection (10/2019), and depression. Patient was lost to follow up from 02/25/19 to 12/03/19. At prior appt with Dr. Brennan on 12/03/19, multiple issues were discussed. Patient states Spring/Summer 2021 he lost focus on his DM management due to how his mother passed away (now living with his grandmother). He has been guessing at his Novolog doses (not following the sliding scale plan provided by Dr Badik on 11/04/18). He has SEVERE neuropathy; he can't stand up from a sitting position without using his arms for support and he also experiences numbness and loss of strength in the palm of his right hand. He was previously followed by Dr. Nabizadeh, but has "aged out" of that practice. He was a no show for two NP appointments at [facility], once in August when his mother died and once in September when hew was admitted with DKA. Dr. Brennan asked the grandmother to call [facility] and obtain a new appointment for him. Patient did mention though at prior appt with Dr Brennan that he wants help in managing his DM. Dr. Brennan advised patient to continue Tresiba 100 units daily (200 units/mL pen) and to re-start Novolog sliding scale + fixed dose of Novolog 10 units three times daily prior to meals. Dr. Brennan would like to transition patient to a 2 component plan (carb + correction factor) for rapid acting insulin in the future.   At prior appt on 03/04/20, patient changed from office visit to virtual visit due to feeling sick. He stated he had  a runny nose and sore throat. He denied abdominal pain, Kussamaul respirations, fruity breath. He last checked his ketones on 03/02/20, which were trace. He was unable leave house right now to get COVID-19 tested due to ice on his porch. He was having issues with adherence with Tresiba and Novolog. He had was takiing TDD 130 units. BG were 97% very high, 3% high per Dexcom Clarity report. Increased TDD 130 --> 156 (20% increase); new basal dose was Tresiba U200 66 units daily and new bolus dose will be Humalog U200 30 units three times daily (10:30 AM, 3:30 PM, 6:30 PM). Continued metformin 1 g BID. Assisted patient download realarm app onto his computer to assist with adherence. Also discussed plan with grandmother who wrote instructions down to also assist with adherence. Emailed patient information about COVID-19 testing sites for when weather improved as well as at home COVID-19 testing incase weather did not improve.  School: not in school right now; planning to re-enrol in [employer] (he is unsure of time of re-enroll)  Insurance Coverage: Managed Medicaid ([employer] plan; ID # [id number])  Diabetes Diagnosis: 04/23/18  Family History: T2DM (maternal grandmother, paternal grandfather); T1DM (paternal grandmother); DM (uncle)  Preferred Pharmacy [id number] - [place], [place] - [address]  [address], [place] [address]  Phone:  [phone] Fax:  [phone]  DEA #:  [id number]  Medication Adherence -Patient denies consistent adherence adherence with Tresiba and Afrezza/Humalog -Current diabetes medications include: Tresiba 66 units daily (200 units/mL pen), HumalogU200 (times/dosing below), metformin   XR 500 mg daily after breakfast and 1000 mg daily after dinner i. Humalog ii. 10:30 AM: 30 units iii. 3:30 PM: 30 units iv. 6:30 PM: 30 units -Prior diabetes medications include: Afrezza (more challenging to use considering  patient's dexterity)  Injection Sites (*** changes since prior appt on 03/04/2020) -Patient-reports injection sites are right arm, abdomen, right leg --Patient reports independently injecting DM medications. --Patient reports rotating injection sites  Diet (*** changes since prior appt on 03/04/2020) Patient reported dietary habits (gets $90 in food stamps each month) Eats 2 meals/day and 1 snacks/day Breakfast (8:30-9:00 am): 3 eggs with 2 pieces of whole wheat nature's own brand toast, no turkey breast or bologna Lunch ("anytime"; 12-1PM or sometimes ~3-4PM): sandwich (mayonaise, bologna, cheese) Dinner (10-11pm): skips Snacks: no snacks  Drinks: water (24 bottle case in 2-3 days), a little bit of milk  Exercise (*** changes since prior appt on 03/04/2020) Patient-reported exercise habits: walks around house (10 min at a time, 2x daily), sometimes walk around porch; sometimes leg kicks in bed everyday for 5 min, doing exercises recommended by PT (using band, marching) does for 5 minutes each day    Dexcom Clarity Report ***   There were no vitals filed for this visit.  Lab Results  Component Value Date   HGBA1C >14 01/26/2020   HGBA1C 14.4 (H) 10/21/2019   HGBA1C 14.0 (A) 02/25/2019   HGBA1C >14 02/25/2019    Lab Results  Component Value Date   CPEPTIDE 3.8 04/23/2018       Assessment:  Patient's goal with DM management: "Get diabetes controlled as independently as possible"  Medication Management/monitoring:  ***  I am concerned for patient as I follow with him since October 2021 almost weekly and patient struggles to follow directions even though I provide written handout and/or email him a copy of instructions and he will repeat back instructions to me prior to ending appointment, however, he often forgets what he was instructed to do. He is very adamant about being independent with his care, but I feel like it is challenging for him. I wonder if lack of adherence  could possibly be due to underlying psychiatric or neurologic condition.    Plan: 2. Medications:  a. *** Tresiba U200 66 units daily b. *** Humalog U200  i. 30 units three times daily (10:30 am, 3:30 pm, 6:30 pm) c. *** metformin 1000 mg twice daily 3. Monitoring a. Continue Dexcom G6 CGM b. Provided Dexcom sensor/transmitter sample c. Labarron Maden has a diagnosis of diabetes, checks blood glucose readings > 4x per day, treats with > 3 insulin injections, and requires frequent adjustments to insulin regimen. This patient will be seen every six months, minimally, to assess adherence to their CGM regimen and diabetes treatment plan 4. Follow Up: ***  This appointment required *** minutes of patient care (this includes precharting, chart review, review of results, virtual care, etc.).  Thank you for involving clinical pharmacist/diabetes educator to assist in providing this patient's care.  Alecia Doi, PharmD, CPP, CDCES  

## 2020-03-22 ENCOUNTER — Ambulatory Visit: Payer: Medicaid Other | Admitting: Occupational Therapy

## 2020-03-22 ENCOUNTER — Ambulatory Visit: Payer: Medicaid Other | Admitting: Rehabilitation

## 2020-03-24 ENCOUNTER — Ambulatory Visit (INDEPENDENT_AMBULATORY_CARE_PROVIDER_SITE_OTHER): Payer: Medicaid Other | Admitting: Pediatrics

## 2020-03-24 ENCOUNTER — Telehealth (INDEPENDENT_AMBULATORY_CARE_PROVIDER_SITE_OTHER): Payer: Self-pay | Admitting: Pharmacist

## 2020-03-24 ENCOUNTER — Encounter (INDEPENDENT_AMBULATORY_CARE_PROVIDER_SITE_OTHER): Payer: Medicaid Other | Admitting: Psychology

## 2020-03-24 ENCOUNTER — Ambulatory Visit (INDEPENDENT_AMBULATORY_CARE_PROVIDER_SITE_OTHER): Payer: Medicaid Other | Admitting: Pharmacist

## 2020-03-24 NOTE — Progress Notes (Deleted)
Pediatric Endocrinology Diabetes Consultation Follow-up Visit  Lucas Cunningham [birth year] [id number]  Chief Complaint: Follow-up {DIABETES TYPE HQIO:96295}    Waynard Edwards, NP   HPI: Lucas Cunningham  is a 20 y.o. male presenting for follow-up of {DIABETES TYPE PLUS:20287}. T1DM with severe insulin resistance, acanthosis, diabetic polyneuropathy leading to difficulty with giving injections and ambulating, morbid obesity, prior hx of COVID-19 infection (10/2019), and depression. There is also a concern about food insecurity.   he is accompanied to this visit by his {family members:20773}. His mother passed away and during that time from January 2021-October 2021 was lost to follow up. He has had follow up with our dietician in November 2021, and frequently meeting with our pharmacist/CDE in the interim, almost weekly (last 03/04/20) at times with a concern of difficulty remembering written and verbal instructions.  Since last visit to [facility] on 12/03/19, he has been well.  No ER visits or hospitalizations.  Insulin regimen:  *** Tresiba U200 66 units daily Humalog U200 fixed dose of 30 units (10:30AM, 3:30 PM, 6:30PM)  Realarm app on computer ***  Medication regimen: Metformin IR $RemoveBefo'1000mg'uTVFiERwIPa$  BID   Hypoglycemia: {can/cannot:17900} feel most low blood sugars.  No glucagon needed recently.  Blood glucose download: *** meter Average glucose checks ***/day Average glucose *** Pattern of ***  CGM download: Using Dexcom G6 continuous glucose monitor    Med-alert ID: {ACTION; IS/IS MWU:[id number]} currently wearing. Injection/Pump sites: {body part:18749} Annual labs due: *** Ophthalmology due: ***. Flu vaccine: Covid Vaccine: *** January 2022    3. ROS: Greater than 10 systems reviewed with pertinent positives listed in HPI, otherwise neg. Constitutional: weight loss/gain, energy level Eyes: No changes in vision Ears/Nose/Mouth/Throat: No difficulty swallowing. Cardiovascular: No  palpitations Respiratory: No increased work of breathing Gastrointestinal: No constipation or diarrhea. No abdominal pain Genitourinary: No nocturia, no polyuria Musculoskeletal: No joint pain Neurologic: Normal sensation, no tremor Endocrine: No polydipsia.  No hyperpigmentation Psychiatric: Normal affect  Past Medical History:  *** Past Medical History:  Diagnosis Date  . Diabetes mellitus without complication ([place])    type 1    Medications:  Outpatient Encounter Medications as of 03/24/2020  Medication Sig Note  . Accu-Chek FastClix Lancets MISC 1 Device by Does not apply route as directed. Use with Fastclix lancet device to check blood sugar up to 8x daily. (diagnosis code E10.65) (Patient not taking: Reported on 02/26/2020)   . acetone, urine, test strip 1 strip by Does not apply route as needed for high blood sugar. Up to 8x daily if necessary (Patient not taking: Reported on 02/26/2020)   . Continuous Blood Gluc Receiver (DEXCOM G6 RECEIVER) DEVI 1 Device by Does not apply route as directed.   . Continuous Blood Gluc Sensor (DEXCOM G6 SENSOR) MISC Inject 1 applicator into the skin as directed. (change sensor every 10 days)   . Continuous Blood Gluc Transmit (DEXCOM G6 TRANSMITTER) MISC Inject 1 Device into the skin as directed. (re-use up to 8x with each new sensor)   . gabapentin (NEURONTIN) 300 MG capsule Take 1 capsule (300 mg total) by mouth 2 (two) times daily. (Patient not taking: Reported on 02/26/2020) 12/03/2019: Has not started taking  . Glucagon (BAQSIMI TWO PACK) 3 MG/DOSE POWD Place 1 spray into the nose as directed. (Patient not taking: Reported on 02/26/2020)   . Glucose Blood (BLOOD GLUCOSE TEST STRIPS) STRP 1 Device by Other route as directed. To check blood sugar up to 8x per day   . Insulin Degludec (TRESIBA FLEXTOUCH) 200  UNIT/ML SOPN 100 Units by Subconjunctival route daily. (Patient taking differently: Inject 100 Units into the skin at bedtime.)   . insulin lispro  (HUMALOG KWIKPEN) 200 UNIT/ML KwikPen Inject up to 100 units daily per provider instructions   . Insulin Pen Needle (BD PEN NEEDLE NANO U/F) 32G X 4 MM MISC Inject up to 8 times per day.   . Insulin Regular Human (AFREZZA) 4 & 8 & 12 units POWD Inhale up to 96 units daily   . ketoconazole (NIZORAL) 2 % cream Apply to fet daily.   . Lancets Misc. (ACCU-CHEK FASTCLIX LANCET) KIT 1 Device by Does not apply route as directed. Use with lancets to check blood sugar up to 8x daily.  (diagnosis code E10.65) (Patient not taking: Reported on 02/26/2020)   . metFORMIN (GLUCOPHAGE) 1000 MG tablet Take 1 tablet (1,000 mg total) by mouth 2 (two) times daily with a meal.   . ondansetron (ZOFRAN ODT) 8 MG disintegrating tablet Take 1 tablet (8 mg total) by mouth every 8 (eight) hours as needed for nausea or vomiting. (Patient not taking: Reported on 02/26/2020)    No facility-administered encounter medications on file as of 03/24/2020.    Allergies: No Known Allergies  Surgical History: Past Surgical History:  Procedure Laterality Date  . NO PAST SURGERIES      Family History:  Family History  Problem Relation Age of Onset  . Heart disease Mother   . Hypertension Mother   . Diabetes Maternal Grandmother   . Diabetes Maternal Grandfather   . Diabetes Paternal Grandmother   . Migraines Neg Hx   . Seizures Neg Hx   . Autism Neg Hx   . ADD / ADHD Neg Hx   . Anxiety disorder Neg Hx   . Depression Neg Hx   . Bipolar disorder Neg Hx   . Schizophrenia Neg Hx    ***   Social History: Social History   Social History Narrative   Lives with his grandmother. He graduated HS 2021   Right handed   Caffeine: 2 cups/day     Physical Exam:  There were no vitals filed for this visit. There were no vitals taken for this visit. Body mass index: body mass index is unknown because there is no height or weight on file. Blood pressure percentiles are not available for patients who are 18 years or older.  Ht  Readings from Last 3 Encounters:  02/18/20 $RemoveB'5\' 11"'isqrfcqv$  (1.803 m) (70 %, Z= 0.51)*  12/22/19 5' 11.26" (1.81 m) (73 %, Z= 0.61)*  10/21/19 6' (1.829 m) (81 %, Z= 0.88)*   * Growth percentiles are based on CDC (Boys, 2-20 Years) data.   Wt Readings from Last 3 Encounters:  02/19/20 275 lb 6.4 oz (124.9 kg) (>99 %, Z= 2.77)*  02/18/20 284 lb (128.8 kg) (>99 %, Z= 2.88)*  02/05/20 277 lb 3.2 oz (125.7 kg) (>99 %, Z= 2.79)*   * Growth percentiles are based on CDC (Boys, 2-20 Years) data.    Physical Exam   Labs: Last hemoglobin A1c:  Lab Results  Component Value Date   HGBA1C >14 01/26/2020   Results for orders placed or performed during the hospital encounter of 03/17/20  CSF culture   Specimen: Lumbar Puncture; Cerebrospinal Fluid  Result Value Ref Range   MICRO NUMBER: [id number]    SPECIMEN QUALITY: Adequate    Source CEREBROSPINAL FLUID (CSF)    STATUS: FINAL    GRAM STAIN: No organisms seen White blood cells seen (  A)    Result: No Growth   Fungus culture w smear   Specimen: Lumbar Puncture; Cerebrospinal Fluid  Result Value Ref Range   MICRO NUMBER: [id number]    SPECIMEN QUALITY: Adequate    Source: CEREBROSPINAL FLUID (CSF)    STATUS: PRELIMINARY    SMEAR: No fungal elements seen.    CULTURE:      No fungi isolated to date. Culture is examined weekly for a total of 28 days incubation. A change in status will result in an updated culture report.  Protein, CSF  Result Value Ref Range   Total Protein, CSF 31 15 - 45 mg/dL  VDRL, CSF  Result Value Ref Range   VDRL Quant, CSF Nonreactive Nonreactiv  CSF cell count with differential  Result Value Ref Range   Color, CSF COLORLESS COLORLESS   Appearance, CSF CLEAR CLEAR   RBC Count, CSF 263 (H) 0 cells/uL   WBC, CSF 16 (H) 0 - 5 cells/uL   Segmented Neutrophils-CSF 2 0 - 6 %   Lymphs, CSF 96 (H) 40 - 80 %   Monocyte/Macrophage 2 (L) 15 - 45 %   Basophils, % 0 0 %  Borrelia species DNA (FLUID), PCR ([place])  Result Value  Ref Range   SOURCE CSF    B. burgdorferi DNA Not detected Not Detect  Glucose, CSF  Result Value Ref Range   Glucose, CSF 187 (H) 40 - 80 mg/dL  Extra Specimen  Result Value Ref Range   Extra tube recieved     Specimen type recieved Serum Separator (SST)   Cytology - non pap  Result Value Ref Range   CYTOLOGY - NON GYN      CYTOLOGY - NON PAP CASE: [id number] PATIENT: [facility] Non-Gynecological Cytology Report     Clinical History: None Provided Specimen Submitted:  A. CEREBROSPINAL FLUID, SPINAL TAP:   FINAL MICROSCOPIC DIAGNOSIS: - No malignant cells identified  SPECIMEN ADEQUACY: Satisfactory for evaluation  GROSS: Received is/are 2 cc's of clear colorless fluid.  (GW:gw) Smears: 0 Concentration Method (Thin Prep): 1 Cell Block: 0 Additional Studies: N/A     Final Diagnosis performed by Gillie Manners, MD.   Electronically signed 03/18/2020 Technical and / or Professional components performed at [facility]. [facility], [place] [address], [place], [place] [address].  Immunohistochemistry Technical component (if applicable) was performed at [facility]. [address], [place], [place], [place] [address].   IMMUNOHISTOCHEMISTRY DISCLAIMER (if applicable): Some of these immunohistochemical stains may have been developed and the performanc e characteristics determine by [facility]. Some may not have been cleared or approved by the U.S. Food and Drug Administration. The FDA has determined that such clearance or approval is not necessary. This test is used for clinical purposes. It should not be regarded as investigational or for research. This laboratory is certified under the [place] (CLIA-88) as qualified to perform high complexity clinical laboratory testing.  The controls stained appropriately.     Lab Results  Component Value Date   HGBA1C >14 01/26/2020   HGBA1C  14.4 (H) 10/21/2019   HGBA1C 14.0 (A) 02/25/2019   HGBA1C >14 02/25/2019    Lab Results  Component Value Date   CREATININE 0.46 (L) 02/18/2020    Assessment/Plan: Lucas Cunningham is a 20 y.o. male with {Diagnoses; diabetes:14078} A1c is {Desc; above/below:16086} goal of 7% or lower.  ***  When a patient is on insulin, intensive monitoring of blood glucose levels and  continuous insulin titration is vital to avoid hyperglycemia and hypoglycemia. Severe hypoglycemia can lead to seizure or death. Hyperglycemia can lead to ketosis requiring ICU admission and intravenous insulin.   1. Uncontrolled type 1 diabetes mellitus with hyperglycemia (HCC) ***   Insulin Regimen: Basal: Bolus:   Carb ratio:   ISF:   Target:  {Plan; diabetes:14079} {CHL THERAPIES; DIABETES COUNSELING:21519}  Follow-up:   No follow-ups on file.  * No order type specified *  Medical decision-making:  I spent *** minutes dedicated to the care of this patient on the date of this encounter to include pre-visit review of laboratory studies, glucose logs/continuous glucose monitor logs, ***diabetes education, ***pump downloads, ***school orders, progress notes, face-to-face time with the patient, and post visit ordering of testing.  Thank you for the opportunity to participate in the care of our mutual patient. Please do not hesitate to contact me should you have any questions regarding the assessment or treatment plan.   Sincerely,   Al Corpus, MD

## 2020-03-24 NOTE — Telephone Encounter (Signed)
Returned call to patient, he had questions about where he can place the [facility] sensors.  Explained they can go anywhere you put insulin, common places are stomach, arms, thighs.  He verbalized understanding.  He also asked if he had to change it every 3 months in which I replied yes.  I heard grandmother in background tell him he has a new one.  He then asked about his appointment as he cancelled but he thought they rescheduled for today since he has a stomach bug, I looked and they were rescheduled for march 9th.  Relayed him that information.  I reminded him that with being sick his blood sugars may change.  He can upload his Dexcom (he said he remembers how) and call us to review his blood sugars at anytime.  He verbalized understanding.

## 2020-03-24 NOTE — Telephone Encounter (Signed)
  Who's calling (name and relationship to patient) : [name] ( self)  Best contact number: [phone]  Provider they see: Dr. Ladona Ridgel  Reason for call: Patient is having trouble with the placement of his transmitters and has questions regarding what to do with the gray piece? Please Advise     PRESCRIPTION REFILL ONLY  Name of prescription:  Pharmacy:

## 2020-03-24 NOTE — Telephone Encounter (Signed)
Called patient's grandmother on 03/24/2020 at 11:04 AM and left HIPAA-compliant VM with instructions to call [facility] back.  Plan to discuss rescheduling virtual appt for today or anytime within next few weeks before 04/21/20. Patient has cancelled prior appts with me 03/12/20 and 03/16/20. He has been sick recently and has had issues with transportation in the inclement weather (snow).I have been following him closely for more optimal accountability in regards to DM management and insulin titration considering extent of his hyperglycemia. I worry about patient having > 1 month follow up with me (last appt 03/04/20)  Thank you for involving pharmacy/diabetes educator to assist in providing this patient's care.   Zachery Conch, PharmD, CPP, CDCES

## 2020-03-25 NOTE — Telephone Encounter (Signed)
Spoke with pt's grandmother Lucas Cunningham (on [place]) and advised per Dr Lucia Gaskins that pt's MRI brain looks fine, no concerns. Pt still has upcoming tests which we will call results when we have them. She verbalized understanding and appreciation for the call.

## 2020-03-26 ENCOUNTER — Telehealth: Payer: Self-pay | Admitting: Neurology

## 2020-03-26 ENCOUNTER — Other Ambulatory Visit: Payer: Self-pay

## 2020-03-26 ENCOUNTER — Encounter: Payer: Self-pay | Admitting: Occupational Therapy

## 2020-03-26 ENCOUNTER — Ambulatory Visit: Payer: Medicaid Other | Admitting: Occupational Therapy

## 2020-03-26 ENCOUNTER — Ambulatory Visit: Payer: Medicaid Other | Attending: Pediatrics | Admitting: Physical Therapy

## 2020-03-26 DIAGNOSIS — M21372 Foot drop, left foot: Secondary | ICD-10-CM | POA: Insufficient documentation

## 2020-03-26 DIAGNOSIS — R2681 Unsteadiness on feet: Secondary | ICD-10-CM | POA: Diagnosis not present

## 2020-03-26 DIAGNOSIS — M25531 Pain in right wrist: Secondary | ICD-10-CM | POA: Insufficient documentation

## 2020-03-26 DIAGNOSIS — R208 Other disturbances of skin sensation: Secondary | ICD-10-CM

## 2020-03-26 DIAGNOSIS — R2689 Other abnormalities of gait and mobility: Secondary | ICD-10-CM

## 2020-03-26 DIAGNOSIS — M6281 Muscle weakness (generalized): Secondary | ICD-10-CM | POA: Insufficient documentation

## 2020-03-26 DIAGNOSIS — R278 Other lack of coordination: Secondary | ICD-10-CM

## 2020-03-26 DIAGNOSIS — R29818 Other symptoms and signs involving the nervous system: Secondary | ICD-10-CM

## 2020-03-26 DIAGNOSIS — M21371 Foot drop, right foot: Secondary | ICD-10-CM | POA: Diagnosis not present

## 2020-03-26 NOTE — Therapy (Addendum)
[facility] [address] [place], [place], [address] Phone: [phone]   Fax:  [phone]  Physical Therapy Treatment/Medicaid Re-auth  Patient Details  Name: Lucas Cunningham MRN: [id number] Date of Birth: [birth year] Referring Provider (PT): [name]   Encounter Date: 03/26/2020   PT End of Session - 03/26/20 1622    Visit Number 11    Number of Visits 23    Authorization Type [employer]    Authorization Time Period 16 visits 12/8-2/02/22 - awaiting auth for more visits    Authorization - Visit Number 9    Authorization - Number of Visits 16    PT Start Time 7782    PT Stop Time 1314    PT Time Calculation (min) 43 min    Equipment Utilized During Treatment Gait belt    Activity Tolerance Patient tolerated treatment well    Behavior During Therapy WFL for tasks assessed/performed           Past Medical History:  Diagnosis Date  . Diabetes mellitus without complication ([place])    type 1    Past Surgical History:  Procedure Laterality Date  . NO PAST SURGERIES      There were no vitals filed for this visit.   Subjective Assessment - 03/26/20 1236    Subjective Saw Dr. Jaynee Eagles recently, per notes Dr. 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  Has not heard about the AFOs from [facility] or has not given them a call. Forgot about having a PT appt today, came today wearing crocs.    Limitations Walking;Standing;House hold activities    Patient Stated Goals "I want to be able to bring in groceries and help around the house."    Currently in Pain? No/denies    Pain Onset More than a month ago                             03/26/20 0001  Transfers  Transfers Sit to Stand;Stand to Sit  Sit to Stand 5: Supervision  Sit to Stand Details (indicate cue type and reason) pt performs with very wide BOS  Five time sit to stand  comments  23.53 with BUE support from mat table with 1st attempt, 21.34 for 2nd attempt with BUE support from mat table - RW anteriorly in front of pt  Ambulation/Gait  Ambulation/Gait Yes  Ambulation/Gait Assistance 5: Supervision  Ambulation/Gait Assistance Details pt wore crocs to today's session - unable to ambulate with B AFOs for re-auth. pt with steppage gait pattern  Ambulation Distance (Feet)  (clinic distances)  Assistive device Rolling walker  Gait Pattern Step-through pattern;Decreased stride length;Decreased dorsiflexion - right;Decreased dorsiflexion - left;Right steppage;Left steppage;Wide base of support;Trunk flexed  Ambulation Surface Level;Indoor  Gait velocity 17 seconds = 1.92 ft/sec with RW and no AFOs  High Level Balance  High Level Balance Comments static standing at RW: needing UE support, able to let go for approx. 5 seconds at a time, stood for a total of 2 minutes and 30 seconds with min guard       Access Code: [email or web address] URL: [email or web address] Date: 03/26/2020 Prepared by: Janann August  Reviewed pt's HEP with pt consistently performing at home. See MedBridge for more details.   Exercises Seated Heel Raise - 1 x daily - 5 x weekly - 1 sets - 10 reps Seated Ankle Plantarflexion with Resistance - 1 x daily - 5  x weekly - 1 sets - 10 reps - pt unable to perform at this time due to being unable to grip theraband  Seated Knee Extension with Anchored Resistance - 1 x daily - 5 x weekly - 1 sets - 10 reps - with red tband  Seated Knee Flexion with Anchored Resistance - 1 x daily - 5 x weekly - 1 sets - 10 reps- upgraded to green band  Standing March with Counter Support - 1 x daily - 5 x weekly - 1 sets - 10 reps Standing Hip Abduction with Counter Support - 1 x daily - 5 x weekly - 1 sets - 10 reps Wide Stance with Head Nods and Counter Support - 1 x daily - 5 x weekly - 1 sets - 10 reps Wide Stance with Head Rotations and Counter Support - 1  x daily - 5 x weekly - 1 sets - 10 reps Wide Stance with Counter Support - 1 x daily - 5 x weekly - 1 sets - 10 reps      03/26/20 1919  PT Education  Education Details phone number to reach out to [facility], progress towards goals, re-cert, reviewed current HEP.  Person(s) Educated Patient  Methods Explanation;Demonstration  Comprehension Verbalized understanding;Returned demonstration       PT Short Term Goals - 02/19/20 1024      PT SHORT TERM GOAL #1   Title Pt will be IND with initial HEP in order to indicate improved function and balance (Target Date: 02/14/2020)    Baseline 02/19/20: met with current program    Status Achieved    Target Date 02/14/20      PT SHORT TERM GOAL #2   Title Will assess B AFOs and get [facility] involved as needed to decrease fall risk and improve efficiency of gait.    Baseline 02/19/20: appt has been set for Gerald Stabs with [facility] to come for brace assessment on 02/25/20    Status Achieved      PT SHORT TERM GOAL #3   Title Pt will improve gait speed to >/= 2.16 ft/sec w/ LRAD and B AFO as needed to indicate dec fall risk.    Baseline 02/19/20: 2.28 ft/sec with RW and bil PLS AFO's with heel wedge    Time --    Period --    Status Achieved      PT SHORT TERM GOAL #4   Title Pt will improve 5TSS to </=18 secs with BUE support in order to indicate improved strength.    Baseline 02/19/20: 17.31 sec's with bil UE assist from standard height surface    Time --    Period --    Status Achieved      PT SHORT TERM GOAL #5   Title Will assess TUG and update STG/LTG as able    Baseline 02/19/20: 19.88 sec's with bil PLS AFO's with heel wedges    Time --    Period --    Status Achieved      PT SHORT TERM GOAL #6   Title Pt will be able to stand statically without support x 15 secs in order to indicate improved balance and strength.    Baseline 02/19/20: with bil PLS AFO's with heel wedges pt able to hold balance 3-5 sec's before needing UE support.    Time --     Period --    Status Not Met          Revised STGs for re-cert:   PT  Short Term Goals - 03/28/20 1617      PT SHORT TERM GOAL #1   Title Pt will obtain B AFOs in order to to decrease fall risk and improve efficiency of gait. ALL STGS DUE 04/18/20    Baseline pt assessed by orthotist, has not yet received braces.    Time 3    Period Weeks    Status New    Target Date 04/18/20      PT SHORT TERM GOAL #2   Title Pt will be able to stand statically without support x10  secs at RW with min guard in order to indicate improved balance and strength.    Baseline max 5 seconds today on 03/26/20    Time 3    Period Weeks    Status New      PT SHORT TERM GOAL #3   Title Pt will improve gait speed to >/= 2.16 ft/sec w/ LRAD and B AFO as needed to indicate dec fall risk.    Baseline 03/26/20: 1.92 ft/sec with RW with pt not wearing AFOs (pt not in appropriate shoe wear)    Time 3    Period Weeks    Status New      PT SHORT TERM GOAL #4   Title Pt will improve 5TSS to </=18 secs with BUE support in order to indicate improved strength.    Baseline 03/26/20: 21.34 for 2nd attempt with BUE support from mat table    Time 3    Period Weeks    Status New      PT SHORT TERM GOAL #5   Title Pt will decr TUG with B AFOs and RW to 17 seconds or less in order to demo decr fall risk.    Baseline on 02/19/20: 19.88 sec's with bil PLS AFO's with heel wedges    Time 3    Period Weeks    Status New      PT SHORT TERM GOAL #6   Baseline --             PT Long Term Goals - 03/26/20 1245      PT LONG TERM GOAL #1   Title Pt will be IND with final HEP in order to indicate improved functional mobility and dec fall risk.  (Target Date: 03/15/20)    Baseline Independent with current HEP    Time 8    Period Weeks    Status Met     PT LONG TERM GOAL #2   Title Pt will improve gait speed to >/=2.62 ft/sec with LRAD and B AFOs as needed in order to indicate safe community ambulation.    Baseline 1.56  ft/sec withRW and no AFOs, 17 seconds = 1.92 ft/sec with RW and no AFOs on 03/26/20    Time 8    Period Weeks    Status Not Met      PT LONG TERM GOAL #3   Title Pt will improve 5TSS to </=16 secs with single UE support only in order to indicate improved functional strength.    Baseline 21.75 secs with BUE support, 21.34 for 2nd attempt with BUE support from mat table on 03/26/20    Time 8    Period Weeks    Status Not Met      PT LONG TERM GOAL #4   Title Pt will ambulate x 500' over unlevel paved surfaces w/ LRAD and B AFOs as needed at mod I level in order to indicate safe  community mobility.    Baseline unable to assess today on 03/26/20 due to time constraints   Time 8    Period Weeks    Status New      PT LONG TERM GOAL #5   Title Pt will perform dynamic standing balance with intermittent UE support x 5 mins in order to perform ADLs safely at home.    Baseline Needs BUE support as this time, unable to stand statically without support, on 03/26/20: static standing needing UE support, able to let go for approx. 5 seconds at a time    Time 8    Period Weeks    Status Not Met          Revised/ongoing LTGs for re-auth:      PT Long Term Goals - 03/28/20 1622      PT LONG TERM GOAL #1   Title Pt will be IND with final HEP in order to indicate improved functional mobility and dec fall risk.  ALL LTGS DUE 05/09/20    Baseline independent with current HEP, will continue to benefit from additions    Time 6    Period Weeks    Status New    Target Date 05/09/20      PT LONG TERM GOAL #2   Title Pt will improve gait speed to >/=2.62 ft/sec with LRAD and B AFOs as needed in order to indicate safe community ambulation.    Baseline 17 seconds = 1.92 ft/sec with RW and no AFOs on 03/26/20    Time 6    Period Weeks    Status On-going      PT LONG TERM GOAL #3   Title Pt will improve 5TSS to </=16 secs with single UE support only in order to indicate improved functional strength.     Baseline 21.75 secs with BUE support, 21.34 for 2nd attempt with BUE support from mat table on 03/26/20    Time 6    Period Weeks    Status On-going      PT LONG TERM GOAL #4   Title Pt will ambulate x 500' over unlevel paved surfaces w/ LRAD and B AFOs as needed at mod I level in order to indicate safe community mobility.    Baseline due to time constraints, unable to assess on 03/26/20    Time 6    Period Weeks    Status On-going      PT LONG TERM GOAL #5   Title Pt will perform dynamic standing balance with intermittent UE support x 3 mins in order to perform ADLs safely at home.    Baseline on 03/26/20: static standing needing UE support, able to let go for approx. 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 Awaiting IVIG treatment. Pt missed a couple of appts due to undergoing testing. Pt was unaware that he had a PT appointment today and wore crocs to session - unable to assess goals with pt wearing B AFOs. Pt still had not heard from [facility] regarding his AFOs (orthotist present at a session in January). Provided pt with [facility]'s contact information to pt to reach out regarding AFOs and diabetic shoes. Today's skilled session  focused on assessing pt's goals for Medicaid re-auth. Pt did not meet LTGs #2, 3, and 5. Pt performed 5x sit <> stand today with BUE support from mat table in 21.34 seconds with a wide BOS. Pt's gait speed today with no AFs was 1.92 ft/sec with RW, indicating pt is a limited community ambulator. Pt unable to stand for 5 minutes performing dynamic tasks at RW, only able to perform static balance for approx. 5 seconds. Pt is currently independent with current HEP and performs consistently. Unable to check outdoor gait goal due to pt not wearing appropriate foot wear for AFOs and due  to time constraints. Will re-auth for an additional 2x week for 6 weeks to continue to focus on strengthening, gait, balance, in order to improve functional indpendence, decr fall risk, and improve community mobility. Goals revised/updated as appropriate.  Personal Factors and Comorbidities Comorbidity 3+;Time since onset of injury/illness/exacerbation  Comorbidities see above  Examination-Activity Limitations Bathing;Dressing;Locomotion Level;Transfers;Squat;Stairs;Stand  Examination-Participation Restrictions Community Activity;Driving;School;Yard Work (school)  Pt will benefit from skilled therapeutic intervention in order to improve on the following deficits Abnormal gait;Decreased activity tolerance;Decreased balance;Decreased endurance;Decreased knowledge of precautions;Decreased knowledge of use of DME;Decreased mobility;Decreased safety awareness;Decreased strength;Difficulty walking;Impaired perceived functional ability;Impaired sensation;Impaired UE functional use;Postural dysfunction  Stability/Clinical Decision Making Evolving/Moderate complexity  Rehab Potential Good  PT Frequency 2x / week  PT Duration 6 weeks  PT Treatment/Interventions ADLs/Self Care Home Management;DME Instruction;Gait training;Stair training;Functional mobility training;Therapeutic activities;Therapeutic exercise;Balance training;Neuromuscular re-education;Patient/family education;Orthotic Fit/Training;Passive range of motion;Vestibular  PT Next Visit Plan did pt call [facility]? Continue to work with [place] PLS braces with quarter heel wedges.  Try elastic laces if he brings new shoes or could try with his current shoes.  Continue BLE strength, activity tolerance and balance.  PT Home Exercise Plan Follow up to see if [facility] has contacted him (they have all info)  Consulted and Agree with Plan of Care Patient      Patient will benefit from skilled therapeutic intervention in order to improve the following deficits  and impairments:     Visit Diagnosis: Other symptoms and signs involving the nervous system  Other lack of coordination  Muscle weakness (generalized)  Other abnormalities of gait and mobility  Unsteadiness on feet     Problem List Patient Active Problem List   Diagnosis Date Noted  . Autonomic neuropathy associated with type 1 diabetes mellitus ([place]) 12/03/2019  . DKA, type 1 ([place]) 10/23/2019  . DKA (diabetic ketoacidosis) ([place]) 10/21/2019  . COVID-19 virus infection 10/21/2019  . Diabetic polyneuropathy associated with type 1 diabetes mellitus ([place]) 02/26/2019  . Leg weakness, bilateral 02/25/2019  . Postprandial bloating 02/25/2019  . Sensory problems with limbs 02/25/2019  . Ketonuria 11/04/2018  . Diabetes ([place]) 04/23/2018  . Uncontrolled type 1 diabetes mellitus with hyperglycemia ([place]) 05/24/2017  . Morbid childhood obesity with BMI greater than 99th percentile for age [facility]) 05/24/2017  . Insulin resistance 05/24/2017  . Acanthosis 05/24/2017    Arliss Journey, PT, DPT  03/26/2020, 4:24 PM  [facility] [address] [place], [place], [address] Phone: [phone]   Fax:  [phone]  Name: Kunta Hilleary MRN: [id number] Date of Birth: 25-Jun-2000

## 2020-03-26 NOTE — Telephone Encounter (Signed)
Been waiting for MRI of the cervical spine and lumbar spine with and without contrast to hopefully complete work-up and then start IVIG if still appropriate based on the results.  They were scheduled at [facility] imaging but on review today it did not look like they were scheduled anymore.  I called and left a message for Lucas Cunningham, she was out of the office, but her call he called me back and said that there was an issue and that they were canceled because his insurance wanted him to go to [place].  However after further evaluation insurance approved having it done at [facility] imaging, they are currently looking for an appointment for him to get MRI of the lumbar spine and cervical spine [name] FYI).  Based on those results, we will likely start IVIG for chronic inflammatory demyelinating polyneuropathy.

## 2020-03-26 NOTE — Therapy (Signed)
[facility] [address] [place], [place], [address] Phone: [phone]   Fax:  [phone]  Occupational Therapy Treatment  Patient Details  Name: Lucas Cunningham MRN: [id number] Date of Birth: Feb 04, 2001 Referring Provider (OT): Percell Miller, NP   Encounter Date: 03/26/2020   OT End of Session - 03/26/20 1318    Visit Number 10    Number of Visits 17    Date for OT Re-Evaluation 03/11/20    Authorization Type [facility] Medicaid    Authorization Time Period 4/13 in 2022  27 OT/PT/ST (Week 6 of 8 in POC) 6 OT visits approved 03/26/2020 - 05/12/20    Authorization - Visit Number 1   in 2022   Authorization - Number of Visits 6    OT Start Time 1318    OT Stop Time 1400    OT Time Calculation (min) 42 min    Activity Tolerance Patient tolerated treatment well    Behavior During Therapy [facility] for tasks assessed/performed           Past Medical History:  Diagnosis Date  . Diabetes mellitus without complication ([place])    type 1    Past Surgical History:  Procedure Laterality Date  . NO PAST SURGERIES      There were no vitals filed for this visit.   Subjective Assessment - 03/26/20 1409    Subjective  Pt denies any pain. Pt says his hand feels "heavy"    Patient is accompanied by: Family member   grandmother - waited in lobby   Pertinent History PMH Type I diabetes, depression, peripheral neuropathy    Limitations Fall Risk. Sensation deficits in RUE    Patient Stated Goals Pt wants "to be able to grab things and write and use my fingers"    Currently in Pain? No/denies                        OT Treatments/Exercises (OP) - 03/26/20 1331      Exercises   Exercises Hand      Neurological Re-education Exercises   Grasp and Release Thumb Opposition    Other Grasp and Release Exercises  working on tip pinch and lateral pinch bilaterally with L>R with tip pinch. Pt unable to complete tip pinch  with RUE without writs drop and with supination for lifting 1 inch block    Other Grasp and Release Exercises  tip pinch with semi circle pegs with LUE with 1 inch blocks in 3-5th digits for encouraging tip pinch. Pt with moderate difficulty and drops. Min difficulty once 1 inch block was removed. Pt continued to use just 1&2 digits with tip pinch. Pt was unable to place gray pegs (small) into board but removed with min difficulty.                    OT Short Term Goals - 03/08/20 1235      OT SHORT TERM GOAL #1   Title Pt will be independent with initial HEP targeting grip strength and range of motion    Baseline not issued yet    Time 4    Period Weeks    Status On-going    Target Date 02/12/20      OT SHORT TERM GOAL #2   Title Pt will verbalize understanding of sensory strategies for RUE and safety    Baseline have not reviewed    Time 4    Period Weeks  Status On-going   03/08/20:  not fully met, needs reinforcement     OT SHORT TERM GOAL #3   Title Pt will increase grip strength in LUE by 5 lbs and RUE by 2 lbs for increase in functional use of BUE for ADLs and IADLs    Baseline RUE 5 LUE 29.5    Time 4    Period Weeks    Status On-going   03/08/20:  R-3.3lbs, L-27.3lb     OT SHORT TERM GOAL #4   Title Pt will perform donning and doffing socks and shoes with supervision for increase in independence with ADLs    Baseline unable to don socks consistently    Time 4    Period Weeks    Status On-going   03/08/20:  unable to put socks on, but able to put on worn shoes     OT SHORT TERM GOAL #5   Title Pt will perform simple cold meal prep (sandwich) and/or light housekeeping with supervision    Baseline not completing at this time    Time 4    Period Weeks    Status On-going   03/08/20:  Pt reports that he has warmed things in microwave or sandwich, straightening room            OT Long Term Goals - 01/15/20 1505      OT LONG TERM GOAL #1   Title Pt will be  independent with updated HEP 03/11/2020    Baseline not issued yet    Time 8    Period Weeks    Status New    Target Date 03/11/20      OT LONG TERM GOAL #2   Title Pt will perform simple warm meal prep and/or light house keeping task with mod I and good safety awareness    Baseline not completing at eval    Time 8    Period Weeks    Status New      OT LONG TERM GOAL #3   Title Pt will improve grip strengthening in LUE by 10 lbs and RUE by 6 lbs for increased ability to perform clothing management for ADLs    Baseline LUE 29.5 RUE 5    Time 8    Period Weeks    Status New      OT LONG TERM GOAL #4   Title Pt will increased box and blocks score in RUE to 12 blocks or more for increase in functional use of RUE    Baseline RUE 6    Time 8    Period Weeks    Status New      OT LONG TERM GOAL #5   Title Pt will report decrease pain in RUE wrist with functional use to no more than 6/10    Baseline 9/10    Time 8    Period Weeks    Status New      OT LONG TERM GOAL #6   Title Pt will complete basic ADLs mod Independent with adapted equipment and strategies PRN    Baseline difficulty with donning socks, cutting up food and fasteners    Time 8    Period Weeks    Status New                 Plan - 03/26/20 1338    Clinical Impression Statement Pt recently received CIDP diagnosis. Awaiting auth for IVIG.    OT Occupational Profile and History Problem Focused Assessment -  Including review of records relating to presenting problem    Occupational performance deficits (Please refer to evaluation for details): IADL's;ADL's;Leisure;Social Participation    Body Structure / Function / Physical Skills ADL;Balance;Coordination;Decreased knowledge of use of DME;Edema;Flexibility;FMC;Dexterity;Strength;GMC;Pain;Tone;UE functional use;ROM;IADL;Sensation    Psychosocial Skills Coping Strategies    Rehab Potential Good    Clinical Decision Making Limited treatment options, no task  modification necessary    Comorbidities Affecting Occupational Performance: None    Modification or Assistance to Complete Evaluation  No modification of tasks or assist necessary to complete eval    OT Frequency 2x / week    OT Duration 8 weeks    OT Treatment/Interventions Self-care/ADL training;Moist Heat;DME and/or AE instruction;Splinting;Balance training;Therapeutic activities;Aquatic Therapy;Therapeutic exercise;Ultrasound;Electrical Stimulation;Cryotherapy;Neuromuscular education;Paraffin;Energy conservation;Manual Therapy;Patient/family education;Passive range of motion;Functional Mobility Training    Plan R dorsal? long opponens splint for incr RUE functional use, coordination/functional use HEP bilateral hands and tip pinch L hand, consider hold waiting for IVIG vs. visit limits    Recommended Other Services Pt is receiving PT    Consulted and Agree with Plan of Care Patient;Family member/caregiver    Family Member Consulted grandmother           Patient will benefit from skilled therapeutic intervention in order to improve the following deficits and impairments:   Body Structure / Function / Physical Skills: ADL,Balance,Coordination,Decreased knowledge of use of DME,Edema,Flexibility,FMC,Dexterity,Strength,GMC,Pain,Tone,UE functional use,ROM,IADL,Sensation   Psychosocial Skills: Coping Strategies   Visit Diagnosis: Other lack of coordination  Other symptoms and signs involving the nervous system  Other disturbances of skin sensation  Muscle weakness (generalized)  Other abnormalities of gait and mobility  Unsteadiness on feet    Problem List Patient Active Problem List   Diagnosis Date Noted  . 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 DKA, type 1 ([place]) 10/23/2019  . DKA (diabetic ketoacidosis) ([place]) 10/21/2019  . COVID-19 virus infection 10/21/2019  . Diabetic polyneuropathy associated with type 1 diabetes mellitus ([place])  02/26/2019  . Leg weakness, bilateral 02/25/2019  . Postprandial bloating 02/25/2019  . Sensory problems with limbs 02/25/2019  . Ketonuria 11/04/2018  . Diabetes ([place]) 04/23/2018  . Uncontrolled type 1 diabetes mellitus with hyperglycemia ([place]) 05/24/2017  . Morbid childhood obesity with BMI greater than 99th percentile for age [facility]) 05/24/2017  . Insulin resistance 05/24/2017  . Acanthosis 05/24/2017    Zachery Conch MOT, OTR/L  03/26/2020, 2:10 PM  [facility] [address] [place], [place], [address] Phone: [phone]   Fax:  [phone]  Name: Lucas Cunningham MRN: [id number] Date of Birth: July 17, 2000

## 2020-03-27 ENCOUNTER — Other Ambulatory Visit: Payer: Medicaid Other

## 2020-03-28 NOTE — Addendum Note (Signed)
Addended by: Drake Leach on: 03/28/2020 04:33 PM   Modules accepted: Orders

## 2020-03-29 NOTE — Telephone Encounter (Signed)
I was able to speak with the pt's grandmother Lucas Cunningham, who stated the MRIs are now going to be on Valarie Merino this Friday the 18th at 9:15 AM.

## 2020-03-30 ENCOUNTER — Other Ambulatory Visit: Payer: Medicaid Other

## 2020-03-31 ENCOUNTER — Ambulatory Visit: Payer: Medicaid Other | Admitting: Rehabilitation

## 2020-03-31 ENCOUNTER — Other Ambulatory Visit: Payer: Self-pay

## 2020-03-31 ENCOUNTER — Encounter: Payer: Self-pay | Admitting: Rehabilitation

## 2020-03-31 ENCOUNTER — Ambulatory Visit: Payer: Medicaid Other | Admitting: Occupational Therapy

## 2020-03-31 DIAGNOSIS — M6281 Muscle weakness (generalized): Secondary | ICD-10-CM

## 2020-03-31 DIAGNOSIS — M25531 Pain in right wrist: Secondary | ICD-10-CM

## 2020-03-31 DIAGNOSIS — M21372 Foot drop, left foot: Secondary | ICD-10-CM

## 2020-03-31 DIAGNOSIS — R2681 Unsteadiness on feet: Secondary | ICD-10-CM

## 2020-03-31 DIAGNOSIS — M21371 Foot drop, right foot: Secondary | ICD-10-CM

## 2020-03-31 DIAGNOSIS — R29818 Other symptoms and signs involving the nervous system: Secondary | ICD-10-CM | POA: Diagnosis not present

## 2020-03-31 DIAGNOSIS — R278 Other lack of coordination: Secondary | ICD-10-CM

## 2020-03-31 DIAGNOSIS — R2689 Other abnormalities of gait and mobility: Secondary | ICD-10-CM

## 2020-03-31 NOTE — Therapy (Signed)
[facility] [address] [place], [place], [address] Phone: [phone]   Fax:  [phone]  Occupational Therapy Treatment  Patient Details  Name: Lucas Cunningham MRN: [id number] Date of Birth: March 18, 2000 Referring Provider (OT): Percell Miller, NP   Encounter Date: 03/31/2020   OT End of Session - 03/31/20 1235    Visit Number 11    Number of Visits 17    Date for OT Re-Evaluation 03/11/20    Authorization Type [employer]    Authorization Time Period (Week 7 of 8 in POC) 6 OT visits approved 03/26/2020 - 05/12/20    Authorization - Visit Number 2    Authorization - Number of Visits 6    OT Start Time 7124    OT Stop Time 1315    OT Time Calculation (min) 44 min    Activity Tolerance Patient tolerated treatment well    Behavior During Therapy [facility] for tasks assessed/performed           Past Medical History:  Diagnosis Date  . Diabetes mellitus without complication ([place])    type 1    Past Surgical History:  Procedure Laterality Date  . NO PAST SURGERIES      There were no vitals filed for this visit.                 OT Treatments/Exercises (OP) - 03/31/20 1245      Splinting   Splinting trialed different wrist braces for RUE - pre fab and determining if need for custom. After investigation, custom dorsal wrist extension splint seems to be best suited for patient. Initiated fabrication of custom splint.                    OT Short Term Goals - 03/08/20 1235      OT SHORT TERM GOAL #1   Title Pt will be independent with initial HEP targeting grip strength and range of motion    Baseline not issued yet    Time 4    Period Weeks    Status On-going    Target Date 02/12/20      OT SHORT TERM GOAL #2   Title Pt will verbalize understanding of sensory strategies for RUE and safety    Baseline have not reviewed    Time 4    Period Weeks    Status On-going   03/08/20:  not  fully met, needs reinforcement     OT SHORT TERM GOAL #3   Title Pt will increase grip strength in LUE by 5 lbs and RUE by 2 lbs for increase in functional use of BUE for ADLs and IADLs    Baseline RUE 5 LUE 29.5    Time 4    Period Weeks    Status On-going   03/08/20:  R-3.3lbs, L-27.3lb     OT SHORT TERM GOAL #4   Title Pt will perform donning and doffing socks and shoes with supervision for increase in independence with ADLs    Baseline unable to don socks consistently    Time 4    Period Weeks    Status On-going   03/08/20:  unable to put socks on, but able to put on worn shoes     OT SHORT TERM GOAL #5   Title Pt will perform simple cold meal prep (sandwich) and/or light housekeeping with supervision    Baseline not completing at this time    Time 4  Period Weeks    Status On-going   03/08/20:  Pt reports that he has warmed things in microwave or sandwich, straightening room            OT Long Term Goals - 01/15/20 1505      OT LONG TERM GOAL #1   Title Pt will be independent with updated HEP 03/11/2020    Baseline not issued yet    Time 8    Period Weeks    Status New    Target Date 03/11/20      OT LONG TERM GOAL #2   Title Pt will perform simple warm meal prep and/or light house keeping task with mod I and good safety awareness    Baseline not completing at eval    Time 8    Period Weeks    Status New      OT LONG TERM GOAL #3   Title Pt will improve grip strengthening in LUE by 10 lbs and RUE by 6 lbs for increased ability to perform clothing management for ADLs    Baseline LUE 29.5 RUE 5    Time 8    Period Weeks    Status New      OT LONG TERM GOAL #4   Title Pt will increased box and blocks score in RUE to 12 blocks or more for increase in functional use of RUE    Baseline RUE 6    Time 8    Period Weeks    Status New      OT LONG TERM GOAL #5   Title Pt will report decrease pain in RUE wrist with functional use to no more than 6/10    Baseline  9/10    Time 8    Period Weeks    Status New      OT LONG TERM GOAL #6   Title Pt will complete basic ADLs mod Independent with adapted equipment and strategies PRN    Baseline difficulty with donning socks, cutting up food and fasteners    Time 8    Period Weeks    Status New                 Plan - 03/31/20 1424    Clinical Impression Statement Pt recently received CIDP diagnosis. Awaiting auth for IVIG. Will benefit from custom splint for strengthening grasp with RUE    OT Occupational Profile and History Problem Focused Assessment - Including review of records relating to presenting problem    Occupational performance deficits (Please refer to evaluation for details): IADL's;ADL's;Leisure;Social Participation    Body Structure / Function / Physical Skills ADL;Balance;Coordination;Decreased knowledge of use of DME;Edema;Flexibility;FMC;Dexterity;Strength;GMC;Pain;Tone;UE functional use;ROM;IADL;Sensation    Psychosocial Skills Coping Strategies    Rehab Potential Good    Clinical Decision Making Limited treatment options, no task modification necessary    Comorbidities Affecting Occupational Performance: None    Modification or Assistance to Complete Evaluation  No modification of tasks or assist necessary to complete eval    OT Frequency 2x / week    OT Duration 8 weeks    OT Treatment/Interventions Self-care/ADL training;Moist Heat;DME and/or AE instruction;Splinting;Balance training;Therapeutic activities;Aquatic Therapy;Therapeutic exercise;Ultrasound;Electrical Stimulation;Cryotherapy;Neuromuscular education;Paraffin;Energy conservation;Manual Therapy;Patient/family education;Passive range of motion;Functional Mobility Training    Plan R dorsal? long opponens splint for incr RUE functional use, coordination/functional use HEP bilateral hands and tip pinch L hand, consider hold waiting for IVIG vs. visit limits    Recommended Other Services Pt is receiving PT  Consulted  and Agree with Plan of Care Patient;Family member/caregiver    Family Member Consulted grandmother           Patient will benefit from skilled therapeutic intervention in order to improve the following deficits and impairments:   Body Structure / Function / Physical Skills: ADL,Balance,Coordination,Decreased knowledge of use of DME,Edema,Flexibility,FMC,Dexterity,Strength,GMC,Pain,Tone,UE functional use,ROM,IADL,Sensation   Psychosocial Skills: Coping Strategies   Visit Diagnosis: Other lack of coordination  Other symptoms and signs involving the nervous system  Pain in right wrist  Muscle weakness (generalized)    Problem List Patient Active Problem List   Diagnosis Date Noted  . Autonomic neuropathy associated with type 1 diabetes mellitus ([place]) 12/03/2019  . DKA, type 1 ([place]) 10/23/2019  . DKA (diabetic ketoacidosis) ([place]) 10/21/2019  . COVID-19 virus infection 10/21/2019  . Diabetic polyneuropathy associated with type 1 diabetes mellitus ([place]) 02/26/2019  . Leg weakness, bilateral 02/25/2019  . Postprandial bloating 02/25/2019  . Sensory problems with limbs 02/25/2019  . Ketonuria 11/04/2018  . Diabetes ([place]) 04/23/2018  . Uncontrolled type 1 diabetes mellitus with hyperglycemia ([place]) 05/24/2017  . Morbid childhood obesity with BMI greater than 99th percentile for age [facility]) 05/24/2017  . Insulin resistance 05/24/2017  . Acanthosis 05/24/2017    Zachery Conch MOT, OTR/L  03/31/2020, 2:29 PM  [facility] [address] [place], [place], [address] Phone: [phone]   Fax:  [phone]  Name: Lucas Cunningham MRN: [id number] Date of Birth: [birth year]

## 2020-03-31 NOTE — Therapy (Signed)
[facility] [address] [place], [place], [address] Phone: [phone]   Fax:  [phone]  Physical Therapy Treatment  Patient Details  Name: Lucas Cunningham MRN: [id number] Date of Birth: [birth year] Referring Provider (PT): Lance Morin Torric NP   Encounter Date: 03/31/2020   PT End of Session - 03/31/20 1455    Visit Number 12    Number of Visits 23    Authorization Type [employer]    Authorization Time Period 16 visits 12/8-2/02/22 - awaiting auth for more visits (used 9 visits), 12 visits approved from 2/16-5/27    Authorization - Visit Number 1    Authorization - Number of Visits 12    PT Start Time 1146    PT Stop Time 1230    PT Time Calculation (min) 44 min    Equipment Utilized During Treatment Gait belt    Activity Tolerance Patient tolerated treatment well    Behavior During Therapy WFL for tasks assessed/performed           Past Medical History:  Diagnosis Date  . Diabetes mellitus without complication ([facility])    type 1    Past Surgical History:  Procedure Laterality Date  . NO PAST SURGERIES      There were no vitals filed for this visit.   Subjective Assessment - 03/31/20 1152    Subjective No changes, reports he did get lumbar puncture and MRI but reports that it got lost.    Patient is accompained by: Family member    Limitations Walking;Standing;House hold activities    Patient Stated Goals "I want to be able to bring in groceries and help around the house."    Currently in Pain? No/denies                             [facility] Adult PT Treatment/Exercise - 03/31/20 1222      Transfers   Transfers Sit to Stand;Stand to Sit    Sit to Stand 5: Supervision;4: Min guard    Sit to Stand Details Manual facilitation for weight shifting;Verbal cues for sequencing;Verbal cues for technique    Stand to Sit 5: Supervision    Stand to Sit Details (indicate cue  type and reason) Verbal cues for sequencing;Verbal cues for technique    Comments Performed x 10 reps to work on LE strength.  Elevated mat to slightly below bed level to work on use of single UE support to stand and eventually no UE support (needs support quickly upon standing.  Encouraged him to practice this at home for strengthening exercise.      Ambulation/Gait   Ambulation/Gait Yes    Ambulation/Gait Assistance 4: Min guard;4: Min assist;5: Supervision    Ambulation/Gait Assistance Details Pt is S with use of RW both with and without AFOs.  He did wear tennis shoes today so donned B Thuasne PLS AFOs (no wedges per pt request) during session.  Pt assessed outdoor gait as PT did not have time last session.  He is able to ambulate x 500' at S level with min cues for safety on unlevel surfaces and decreasing hip and knee flexion.  Discussed decreasing AD during session as pt reports he still ambulates in home without device and short distance across grass without device at home as RW remains in car.  PT did not feel safe assessing gait outdoors without device but did do in session in // bars with  quad tip cane and without AD and pt did okay, increasing BOS but overall did well.  PT suggested trialing B loftstrand crutches however due to poor grip strength in RUE, could only utilize L loft strand correctly.  Did trial this in // bars first and then out into gym x 40' then x 115' at min A level with cues for sequencing and technique as well as narrowing BOS slightly.  Feel that this would be a good progression for pt in future.  He is unsure as he feels weaker in the morning, but discussed that he can still keep RW and use in the am and progress with loft strands.  Pt in agreement.    Ambulation Distance (Feet) 500 Feet   outdoors, see above for indoors   Assistive device Rolling walker;L Forearm Crutch   quad tip cane   Gait Pattern Step-through pattern;Decreased stride length;Decreased dorsiflexion -  right;Decreased dorsiflexion - left;Right steppage;Left steppage;Wide base of support;Trunk flexed    Ambulation Surface Level;Indoor;Unlevel;Outdoor    Gait velocity 2.67 ft/sec with RW and B AFOs                    PT Short Term Goals - 03/31/20 1205      PT SHORT TERM GOAL #1   Title Pt will obtain B AFOs in order to to decrease fall risk and improve efficiency of gait. ALL STGS DUE 04/18/20    Baseline pt assessed by orthotist, has not yet received braces.    Time 3    Period Weeks    Status New    Target Date 04/18/20      PT SHORT TERM GOAL #2   Title Pt will be able to stand statically without support x10  secs at RW with min guard in order to indicate improved balance and strength.    Baseline max 5 seconds today on 03/26/20    Time 3    Period Weeks    Status New      PT SHORT TERM GOAL #3   Title Pt will improve gait speed to >/= 2.16 ft/sec w/ LRAD and B AFO as needed to indicate dec fall risk.    Baseline 03/26/20: 1.92 ft/sec with RW with pt not wearing AFOs (pt not in appropriate shoe wear), 2.67 ft/sec with B AFOs and RW on 03/31/20    Time 3    Period Weeks    Status Achieved      PT SHORT TERM GOAL #4   Title Pt will improve 5TSS to </=18 secs with BUE support in order to indicate improved strength.    Baseline 03/26/20: 21.34 for 2nd attempt with BUE support from mat table    Time 3    Period Weeks    Status New      PT SHORT TERM GOAL #5   Title Pt will decr TUG with B AFOs and RW to 17 seconds or less in order to demo decr fall risk.    Baseline on 02/19/20: 19.88 sec's with bil PLS AFO's with heel wedges    Time 3    Period Weeks    Status New             PT Long Term Goals - 03/28/20 1622      PT LONG TERM GOAL #1   Title Pt will be IND with final HEP in order to indicate improved functional mobility and dec fall risk.  ALL LTGS DUE 05/09/20  Baseline independent with current HEP, will continue to benefit from additions    Time 6     Period Weeks    Status New    Target Date 05/09/20      PT LONG TERM GOAL #2   Title Pt will improve gait speed to >/=2.62 ft/sec with LRAD and B AFOs as needed in order to indicate safe community ambulation.    Baseline 17 seconds = 1.92 ft/sec with RW and no AFOs on 03/26/20    Time 6    Period Weeks    Status On-going      PT LONG TERM GOAL #3   Title Pt will improve 5TSS to </=16 secs with single UE support only in order to indicate improved functional strength.    Baseline 21.75 secs with BUE support, 21.34 for 2nd attempt with BUE support from mat table on 03/26/20    Time 6    Period Weeks    Status On-going      PT LONG TERM GOAL #4   Title Pt will ambulate x 500' over unlevel paved surfaces w/ LRAD and B AFOs as needed at mod I level in order to indicate safe community mobility.    Baseline due to time constraints, unable to assess on 03/26/20    Time 6    Period Weeks    Status On-going      PT LONG TERM GOAL #5   Title Pt will perform dynamic standing balance with intermittent UE support x 3 mins in order to perform ADLs safely at home.    Baseline on 03/26/20: static standing needing UE support, able to let go for approx. 5 seconds at a time, stood for a total on 2 minutes and 30 seconds    Time 6    Period Weeks    Status Revised                 Plan - 03/31/20 1458    Clinical Impression Statement Pt hopefull of treatment of IVIG given new diagnosis of CIPD (still awaiting test results).  Continue to see progress esp when using B AFOs.  PT waiting to hear from orthotist about delivery date on AFOs, has diabetic shoe fitting next week on 2/24.    Personal Factors and Comorbidities Comorbidity 3+;Time since onset of injury/illness/exacerbation    Comorbidities see above    Examination-Activity Limitations Bathing;Dressing;Locomotion Level;Transfers;Squat;Stairs;Stand    Examination-Participation Restrictions Community Activity;Driving;School;Yard Work   [employer]ical [employer]    Stability/Clinical Decision Making Evolving/Moderate complexity    Rehab Potential Good    PT Frequency 2x / week    PT Duration 6 weeks    PT Treatment/Interventions ADLs/Self Care Home Management;DME Instruction;Gait training;Stair training;Functional mobility training;Therapeutic activities;Therapeutic exercise;Balance training;Neuromuscular re-education;Patient/family education;Orthotic Fit/Training;Passive range of motion;Vestibular    PT Next Visit Plan Work on gait with L loftstrand.  Continue to work with Thuasne PLS braces with quarter heel wedges.  Try elastic laces if he brings new shoes or could try with his current shoes.  Continue BLE strength, activity tolerance and balance.    PT Home Exercise Plan Gavyn Ybarra [facility].    Consulted and Agree with Plan of Care Patient           Patient will benefit from skilled therapeutic intervention in order to improve the following deficits and impairments:  Abnormal gait,Decreased activity tolerance,Decreased balance,Decreased endurance,Decreased knowledge of precautions,Decreased knowledge of use of DME,Decreased mobility,Decreased safety awareness,Decreased strength,Difficulty walking,Impaired perceived functional ability,Impaired  sensation,Impaired UE functional use,Postural dysfunction  Visit Diagnosis: Muscle weakness (generalized)  Other abnormalities of gait and mobility  Unsteadiness on feet  Foot drop, left  Foot drop, right     Problem List Patient Active Problem List   Diagnosis Date Noted  . 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 DKA, type 1 ([facility]) 10/23/2019  . DKA (diabetic ketoacidosis) ([facility]) 10/21/2019  . COVID-19 virus infection 10/21/2019  . Diabetic polyneuropathy associated with type 1 diabetes mellitus ([facility]) 02/26/2019  . Leg weakness, bilateral 02/25/2019  . Postprandial bloating 02/25/2019  . Sensory problems with limbs 02/25/2019  .  Ketonuria 11/04/2018  . Diabetes ([facility]) 04/23/2018  . Uncontrolled type 1 diabetes mellitus with hyperglycemia ([facility]) 05/24/2017  . Morbid childhood obesity with BMI greater than 99th percentile for age [facility]) 05/24/2017  . Insulin resistance 05/24/2017  . Acanthosis 05/24/2017    Harriet Butte, PT, MPT [facility] [address] [place], [place], [address] Phone: [phone]   Fax:  [phone] 03/31/20, 3:01 PM  Name: Yamato Kopf MRN: [id number] Date of Birth: 2000/02/18

## 2020-04-02 ENCOUNTER — Ambulatory Visit: Payer: Medicaid Other | Admitting: Occupational Therapy

## 2020-04-02 ENCOUNTER — Ambulatory Visit: Payer: Medicaid Other | Admitting: Physical Therapy

## 2020-04-03 ENCOUNTER — Telehealth: Payer: Self-pay | Admitting: Neurology

## 2020-04-03 NOTE — Telephone Encounter (Signed)
[name], MRI Lumbar and Cervical still not scheduled. Can you call Lucas Cunningham and see what is going on? Maybe dana or emily can help get these scheduled? Patient is very young, his mother recently died and he is living with his elderly grandmother who appears not to understand circumstances, I think they need help.

## 2020-04-05 ENCOUNTER — Ambulatory Visit: Payer: Medicaid Other | Admitting: Occupational Therapy

## 2020-04-05 ENCOUNTER — Ambulatory Visit: Payer: Medicaid Other | Admitting: Physical Therapy

## 2020-04-05 NOTE — Telephone Encounter (Signed)
Wonderful, thanks!  

## 2020-04-05 NOTE — Telephone Encounter (Signed)
Carollee Herter from [facility] said that the patient had MRI's on Friday 04/02/20. I put the results in Debra's box.

## 2020-04-06 ENCOUNTER — Telehealth: Payer: Self-pay | Admitting: *Deleted

## 2020-04-06 NOTE — Telephone Encounter (Signed)
Called pt's grandmother Lucas Cunningham (on [place]) and LVM advising I would give her a call tomorrow as office is currently closed.

## 2020-04-06 NOTE — Telephone Encounter (Signed)
-----   Message from Anson Fret, MD sent at 04/05/2020  6:05 PM EST ----- MRI of the cervical and lumbar spine unremarkable. I would like to initiate IVIG. Let them know the results and that we are going to order the medication and keep him updated. If they are ok with that, let's go forward with ordering IVIG please thanks

## 2020-04-07 ENCOUNTER — Ambulatory Visit: Payer: Medicaid Other | Admitting: Rehabilitation

## 2020-04-07 ENCOUNTER — Telehealth (INDEPENDENT_AMBULATORY_CARE_PROVIDER_SITE_OTHER): Payer: Self-pay | Admitting: Pharmacist

## 2020-04-07 ENCOUNTER — Encounter: Payer: Self-pay | Admitting: Rehabilitation

## 2020-04-07 ENCOUNTER — Other Ambulatory Visit: Payer: Self-pay

## 2020-04-07 ENCOUNTER — Ambulatory Visit: Payer: Medicaid Other | Admitting: Occupational Therapy

## 2020-04-07 ENCOUNTER — Encounter: Payer: Self-pay | Admitting: Occupational Therapy

## 2020-04-07 DIAGNOSIS — M21371 Foot drop, right foot: Secondary | ICD-10-CM

## 2020-04-07 DIAGNOSIS — R2681 Unsteadiness on feet: Secondary | ICD-10-CM

## 2020-04-07 DIAGNOSIS — R278 Other lack of coordination: Secondary | ICD-10-CM

## 2020-04-07 DIAGNOSIS — R29818 Other symptoms and signs involving the nervous system: Secondary | ICD-10-CM | POA: Diagnosis not present

## 2020-04-07 DIAGNOSIS — M6281 Muscle weakness (generalized): Secondary | ICD-10-CM

## 2020-04-07 DIAGNOSIS — M21372 Foot drop, left foot: Secondary | ICD-10-CM

## 2020-04-07 DIAGNOSIS — R2689 Other abnormalities of gait and mobility: Secondary | ICD-10-CM

## 2020-04-07 DIAGNOSIS — M25531 Pain in right wrist: Secondary | ICD-10-CM

## 2020-04-07 NOTE — Telephone Encounter (Signed)
Who's calling (name and relationship to patient) : Lucas Cunningham   Best contact number: [phone]  Provider they see: Dr. Ladona Ridgel   Reason for call: Requesting a call back from Dr. Ladona Ridgel   Call ID:      PRESCRIPTION REFILL ONLY  Name of prescription:  Pharmacy:

## 2020-04-07 NOTE — Therapy (Signed)
[facility] [address] [place], [place], [address] Phone: [phone]   Fax:  [phone]  Physical Therapy Treatment  Patient Details  Name: Lucas Cunningham MRN: [id number] Date of Birth: 20-Jul-2000 Referring Provider (PT): Lance MorinSkinner-Kiser, Kawanna Torric NP   Encounter Date: 04/07/2020   PT End of Session - 04/07/20 1159    Visit Number 13    Number of Visits 23    Authorization Type [employer]    Authorization Time Period 16 visits 12/8-2/02/22 - awaiting auth for more visits (used 9 visits), 12 visits approved from 2/16-5/27    Authorization - Visit Number 2    Authorization - Number of Visits 12    PT Start Time 1148    PT Stop Time 1230    PT Time Calculation (min) 42 min    Equipment Utilized During Treatment Gait belt    Activity Tolerance Patient tolerated treatment well    Behavior During Therapy WFL for tasks assessed/performed           Past Medical History:  Diagnosis Date  . Diabetes mellitus without complication (HCC)    type 1    Past Surgical History:  Procedure Laterality Date  . NO PAST SURGERIES      There were no vitals filed for this visit.   Subjective Assessment - 04/07/20 1158    Subjective Doing better since missing last session due to pain in legs and hands.    Limitations Walking;Standing;House hold activities    Patient Stated Goals "I want to be able to bring in groceries and help around the house."    Currently in Pain? No/denies                             [facility] Adult PT Treatment/Exercise - 04/07/20 0001      Transfers   Transfers Sit to Stand;Stand to Sit    Sit to Stand 5: Supervision;4: Min guard      Ambulation/Gait   Ambulation/Gait Yes    Ambulation/Gait Assistance 5: Supervision    Ambulation/Gait Assistance Details Throughout session not for gait training purposes so did not don AFOs during session as focus was strengthening.  S  level with RW today with B steppage gait.    Ambulation Distance (Feet) 100 Feet    Assistive device Rolling walker    Gait Pattern Step-through pattern;Decreased stride length;Decreased dorsiflexion - right;Decreased dorsiflexion - left;Right steppage;Left steppage;Wide base of support;Trunk flexed    Ambulation Surface Level;Indoor    Stairs Yes    Stairs Assistance 4: Min guard;4: Min assist    Stairs Assistance Details (indicate cue type and reason) Stair negotiation for strengthening with BUE support.  Pt heavily reliant on UEs for both ascending and descending, but due to positioning has increased pain in hands when ascending.  Tactile tapping at quads for increased activation when ascending.    Stair Management Technique Two rails;Step to pattern;Forwards    Number of Stairs 4    Height of Stairs 6      Neuro Re-ed    Neuro Re-ed Details  Wall bumps with RW in front of him for support.  Feet approx 6" from wall x 10 reps.  Began with BUE support>single UE support to only touching once completely upright.  Had him move feet slightly closer to wall and attempt to do without UE support.  Pt was able to do a few without support but overall  needed at least light single UE support.  Attempted to do limits of stability on rocker board, however due to poor ankle PF, needed assist from PT and heavy cues and support for technique, therefore DC task.      Exercises   Other Exercises  Supine BLE bridges x 10 reps with 3-5 sec hold, BLE bridge with hip abd (green band) x 10 reps, BLE bridging with abd without UEs being in contact with mat x 10 reps.  SL clam abd x 10 reps each side with green band (provided green band for home).  Hooklying marching x 10 reps>bicycles x 10 reps.  Note difficulty maintaining post pelvic tilt, therefore PT placed hand for tactile cues and only had him perform 2 bicycle reps each side before resting which seemed to improve technique.  Ended with side plank (on elbow) x 5 reps  each.                    PT Short Term Goals - 03/31/20 1205      PT SHORT TERM GOAL #1   Title Pt will obtain B AFOs in order to to decrease fall risk and improve efficiency of gait. ALL STGS DUE 04/18/20    Baseline pt assessed by orthotist, has not yet received braces.    Time 3    Period Weeks    Status New    Target Date 04/18/20      PT SHORT TERM GOAL #2   Title Pt will be able to stand statically without support x10  secs at RW with min guard in order to indicate improved balance and strength.    Baseline max 5 seconds today on 03/26/20    Time 3    Period Weeks    Status New      PT SHORT TERM GOAL #3   Title Pt will improve gait speed to >/= 2.16 ft/sec w/ LRAD and B AFO as needed to indicate dec fall risk.    Baseline 03/26/20: 1.92 ft/sec with RW with pt not wearing AFOs (pt not in appropriate shoe wear), 2.67 ft/sec with B AFOs and RW on 03/31/20    Time 3    Period Weeks    Status Achieved      PT SHORT TERM GOAL #4   Title Pt will improve 5TSS to </=18 secs with BUE support in order to indicate improved strength.    Baseline 03/26/20: 21.34 for 2nd attempt with BUE support from mat table    Time 3    Period Weeks    Status New      PT SHORT TERM GOAL #5   Title Pt will decr TUG with B AFOs and RW to 17 seconds or less in order to demo decr fall risk.    Baseline on 02/19/20: 19.88 sec's with bil PLS AFO's with heel wedges    Time 3    Period Weeks    Status New             PT Long Term Goals - 03/28/20 1622      PT LONG TERM GOAL #1   Title Pt will be IND with final HEP in order to indicate improved functional mobility and dec fall risk.  ALL LTGS DUE 05/09/20    Baseline independent with current HEP, will continue to benefit from additions    Time 6    Period Weeks    Status New    Target Date 05/09/20  PT LONG TERM GOAL #2   Title Pt will improve gait speed to >/=2.62 ft/sec with LRAD and B AFOs as needed in order to indicate safe  community ambulation.    Baseline 17 seconds = 1.92 ft/sec with RW and no AFOs on 03/26/20    Time 6    Period Weeks    Status On-going      PT LONG TERM GOAL #3   Title Pt will improve 5TSS to </=16 secs with single UE support only in order to indicate improved functional strength.    Baseline 21.75 secs with BUE support, 21.34 for 2nd attempt with BUE support from mat table on 03/26/20    Time 6    Period Weeks    Status On-going      PT LONG TERM GOAL #4   Title Pt will ambulate x 500' over unlevel paved surfaces w/ LRAD and B AFOs as needed at mod I level in order to indicate safe community mobility.    Baseline due to time constraints, unable to assess on 03/26/20    Time 6    Period Weeks    Status On-going      PT LONG TERM GOAL #5   Title Pt will perform dynamic standing balance with intermittent UE support x 3 mins in order to perform ADLs safely at home.    Baseline on 03/26/20: static standing needing UE support, able to let go for approx. 5 seconds at a time, stood for a total on 2 minutes and 30 seconds    Time 6    Period Weeks    Status Revised                 Plan - 04/07/20 1257    Clinical Impression Statement Per notes, Dr. Lucia Gaskins has reached out to grandmother about starting IVIG.  Recommended that they call or go over to [facility] today to set this up.  Session focused on standing balance and BLE strengthening.  Pt able to progress abd to green band vs red at home.    Personal Factors and Comorbidities Comorbidity 3+;Time since onset of injury/illness/exacerbation    Comorbidities see above    Examination-Activity Limitations Bathing;Dressing;Locomotion Level;Transfers;Squat;Stairs;Stand    Examination-Participation Restrictions Community Activity;Driving;School;Yard Work   Associate Professor Evolving/Moderate complexity    Rehab Potential Good    PT Frequency 2x / week    PT Duration 6 weeks    PT Treatment/Interventions ADLs/Self Care  Home Management;DME Instruction;Gait training;Stair training;Functional mobility training;Therapeutic activities;Therapeutic exercise;Balance training;Neuromuscular re-education;Patient/family education;Orthotic Fit/Training;Passive range of motion;Vestibular    PT Next Visit Plan Work on gait with L loftstrand and B AFOs as able.  Continue to work with [place] PLS braces with quarter heel wedges.  Try elastic laces if he brings new shoes or could try with his current shoes.  Continue BLE strength, activity tolerance and balance.    PT Home Exercise Plan Arin Peral waiting to hear from [facility] about AFOs.    Consulted and Agree with Plan of Care Patient           Patient will benefit from skilled therapeutic intervention in order to improve the following deficits and impairments:  Abnormal gait,Decreased activity tolerance,Decreased balance,Decreased endurance,Decreased knowledge of precautions,Decreased knowledge of use of DME,Decreased mobility,Decreased safety awareness,Decreased strength,Difficulty walking,Impaired perceived functional ability,Impaired sensation,Impaired UE functional use,Postural dysfunction  Visit Diagnosis: Muscle weakness (generalized)  Other abnormalities of gait and mobility  Unsteadiness on feet  Foot drop, left  Foot  drop, right     Problem List Patient Active Problem List   Diagnosis Date Noted  . 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 DKA, type 1 (HCC) 10/23/2019  . DKA (diabetic ketoacidosis) (HCC) 10/21/2019  . COVID-19 virus infection 10/21/2019  . Diabetic polyneuropathy associated with type 1 diabetes mellitus (HCC) 02/26/2019  . Leg weakness, bilateral 02/25/2019  . Postprandial bloating 02/25/2019  . Sensory problems with limbs 02/25/2019  . Ketonuria 11/04/2018  . Diabetes (HCC) 04/23/2018  . Uncontrolled type 1 diabetes mellitus with hyperglycemia (HCC) 05/24/2017  . Morbid childhood obesity with BMI  greater than 99th percentile for age [facility]) 05/24/2017  . Insulin resistance 05/24/2017  . Acanthosis 05/24/2017    Harriet Butte, PT, MPT [facility] [address] [place], [place], [address] Phone: [phone]   Fax:  [phone] 04/07/20, 12:59 PM  Name: Lucas Cunningham MRN: [id number] Date of Birth: 2000/03/21

## 2020-04-07 NOTE — Telephone Encounter (Signed)
Spoke with pt. He is aware of unremarkable MRI c-spine and L-spine and that Dr Lucia Gaskins wants to proceed with IVIG. Pt is amenable and is aware that we will use [facility]. He verbalized appreciation and at this time did not have any questions. He knows to call if he has any later.

## 2020-04-07 NOTE — Telephone Encounter (Signed)
Called patient on 04/07/2020 at 3:43 PM and left HIPAA-compliant VM with instructions to call [facility] back.  Thank you for involving pharmacy/diabetes educator to assist in providing this patient's care.   Zachery Conch, PharmD, CPP, CDCES

## 2020-04-07 NOTE — Telephone Encounter (Signed)
Spoke with patient's grandmother [name]. She is aware the MRI cervical and lumbar were both unremarkable and Dr Lucia Gaskins would like to initiate IVIG. She is amenable to this and also says the patient is fine with it. She will be home around 5 pm and I will be able to speak with the patient to discuss this as well.   I have begun printing results and office notes for IVIG authorization. Also spoke with Porfirio Mylar at [facility] and confirmed they do take pt's insurance and can proceed with auth as soon as we get everything faxed to them.

## 2020-04-07 NOTE — Therapy (Signed)
[facility] [address] [place], [place], [address] Phone: [phone]   Fax:  [phone]  Occupational Therapy Treatment & Recertification  Patient Details  Name: Lucas Cunningham MRN: [id number] Date of Birth: 22-Nov-2000 Referring Provider (OT): Percell Miller, NP   Encounter Date: 04/07/2020   OT End of Session - 04/07/20 1330    Visit Number 12    Number of Visits 18    Date for OT Re-Evaluation 03/11/20    Authorization Type [employer]    Authorization Time Period (Week 8 of 8 in POC) 6 OT visits approved 03/26/2020 - 05/12/20    Authorization - Visit Number 3    Authorization - Number of Visits 6    OT Start Time 9233    OT Stop Time 1316    OT Time Calculation (min) 41 min    Activity Tolerance Patient tolerated treatment well    Behavior During Therapy [facility] for tasks assessed/performed           Past Medical History:  Diagnosis Date  . Diabetes mellitus without complication ([place])    type 1    Past Surgical History:  Procedure Laterality Date  . NO PAST SURGERIES      There were no vitals filed for this visit.                 OT Treatments/Exercises (OP) - 04/07/20 1410      Neurological Re-education Exercises   Grasp and Release Thumb Opposition;Digit Abduction/Adduction - pt worked on picking up 1 inch blocks and large pegs with RUE with donning of custom splint for maintaining wrist extension in RUE. Pt able to complete tip and lateral pinch on blocks with RUE.      Splinting   Splinting finished fabricating dorsal wrist extension splint                    OT Short Term Goals - 04/07/20 1306      OT SHORT TERM GOAL #1   Title Pt will be independent with initial HEP targeting grip strength and range of motion    Baseline not issued yet    Time 4    Period Weeks    Status On-going    Target Date 02/12/20      OT SHORT TERM GOAL #2   Title Pt will verbalize  understanding of sensory strategies for RUE and safety    Baseline have not reviewed    Time 4    Period Weeks    Status Achieved   received education on sensory strategies and safety for poor sensation in BUE     OT SHORT TERM GOAL #3   Title Pt will increase grip strength in LUE by 5 lbs and RUE by 2 lbs for increase in functional use of BUE for ADLs and IADLs    Baseline RUE 5 LUE 29.5    Time 4    Period Weeks    Status On-going   04/07/20 LUE 14.5 lbs RUE 2.4 lbs     OT SHORT TERM GOAL #4   Title Pt will perform donning and doffing socks and shoes with supervision for increase in independence with ADLs    Baseline unable to don socks consistently    Time 4    Period Weeks    Status Partially Met   04/07/20 - pt is not able to put socks on without physical assistance but able to put shoes on.  OT SHORT TERM GOAL #5   Title Pt will perform simple cold meal prep (sandwich) and/or light housekeeping with supervision    Baseline not completing at this time    Time 4    Period Weeks    Status Achieved   04/07/20 - pt reports making cereal but not sandwich and participating in some home management            OT Long Term Goals - 04/07/20 1311      OT [place] #1   Title Pt will be independent with updated HEP 03/11/2020    Baseline not issued yet    Time 8    Period Weeks    Status On-going      OT LONG TERM GOAL #2   Title Pt will perform simple warm meal prep and/or light house keeping task with mod I and good safety awareness    Baseline not completing at eval    Time 8    Period Weeks    Status Deferred   cooked an egg with min to mod A but demonstrating much difficulty with BUE use for tasks     OT LONG TERM GOAL #3   Title Pt will improve grip strengthening in LUE by 10 lbs and RUE by 6 lbs for increased ability to perform clothing management for ADLs    Baseline LUE 29.5 RUE 5    Time 8    Period Weeks    Status New   will see how BUE respond to IVIG if  approved for CIDP     OT LONG TERM GOAL #4   Title Pt will increased box and blocks score in RUE to 12 blocks or more for increase in functional use of RUE    Baseline RUE 6    Time 8    Period Weeks    Status On-going   04/07/20 - 7 blocks     OT LONG TERM GOAL #5   Title Pt will report decrease pain in RUE wrist with functional use to no more than 6/10    Baseline 9/10    Time 8    Period Weeks    Status Achieved   3/10     OT LONG TERM GOAL #6   Title Pt will verbalize understanding of adapted equipment and strategies PRN for basic ADLs and IADLs.    Baseline difficulty with donning socks, cutting up food and fasteners    Time 8    Period Weeks    Status Revised                 Plan - 04/07/20 1331    Clinical Impression Statement Pt has met 2/5 STGs and 1/6 LTGs. Pt with limited improvement with functional use of BUE d/t nature of condition. Pt has improved grasp and release with RUE with use of new custom splint fabricated for wrist extension. Pt demonstrates ability to pick up 1 inch blocks now with RUE with tip pinch and lateral pinch with use of splint. Pt continues ot work towards increasing overal functional in [place]. Skilled occupational therapy continues ot be beneficial for maintaining current function and learning adapted strategies. With potential receival of IVIG, pt could benefit from increasing BUE strength and functional use.    OT Occupational Profile and History Problem Focused Assessment - Including review of records relating to presenting problem    Occupational performance deficits (Please refer to evaluation for details): IADL's;ADL's;Leisure;Social Participation  Body Structure / Function / Physical Skills ADL;Balance;Coordination;Decreased knowledge of use of DME;Edema;Flexibility;FMC;Dexterity;Strength;GMC;Pain;Tone;UE functional use;ROM;IADL;Sensation    Psychosocial Skills Coping Strategies    Rehab Potential Good    Clinical Decision Making Limited  treatment options, no task modification necessary    Comorbidities Affecting Occupational Performance: None    Modification or Assistance to Complete Evaluation  No modification of tasks or assist necessary to complete eval    OT Frequency 1x / week    OT Duration 6 weeks   1x/week for 6 weeks after renewal   OT Treatment/Interventions Self-care/ADL training;Moist Heat;DME and/or AE instruction;Splinting;Balance training;Therapeutic activities;Aquatic Therapy;Therapeutic exercise;Ultrasound;Electrical Stimulation;Cryotherapy;Neuromuscular education;Paraffin;Energy conservation;Manual Therapy;Patient/family education;Passive range of motion;Functional Mobility Training    Plan right splint made.(in box under laptop on my desk), work on tip and lateral pinch with splint on RUE, check on status of IVIG    Recommended Other Services Pt is receiving PT    Consulted and Agree with Plan of Care Patient;Family member/caregiver    Family Member Consulted grandmother           Patient will benefit from skilled therapeutic intervention in order to improve the following deficits and impairments:   Body Structure / Function / Physical Skills: ADL,Balance,Coordination,Decreased knowledge of use of DME,Edema,Flexibility,FMC,Dexterity,Strength,GMC,Pain,Tone,UE functional use,ROM,IADL,Sensation   Psychosocial Skills: Coping Strategies   Visit Diagnosis: Muscle weakness (generalized) - Plan: Ot plan of care cert/re-cert  Other abnormalities of gait and mobility - Plan: Ot plan of care cert/re-cert  Unsteadiness on feet - Plan: Ot plan of care cert/re-cert  Pain in right wrist - Plan: Ot plan of care cert/re-cert  Other lack of coordination - Plan: Ot plan of care cert/re-cert    Problem List Patient Active Problem List   Diagnosis Date Noted  . Autonomic neuropathy associated with type 1 diabetes mellitus ([place]) 12/03/2019  . DKA, type 1 ([place]) 10/23/2019  . DKA (diabetic ketoacidosis) ([place])  10/21/2019  . COVID-19 virus infection 10/21/2019  . Diabetic polyneuropathy associated with type 1 diabetes mellitus ([place]) 02/26/2019  . Leg weakness, bilateral 02/25/2019  . Postprandial bloating 02/25/2019  . Sensory problems with limbs 02/25/2019  . Ketonuria 11/04/2018  . Diabetes ([place]) 04/23/2018  . Uncontrolled type 1 diabetes mellitus with hyperglycemia ([place]) 05/24/2017  . Morbid childhood obesity with BMI greater than 99th percentile for age [facility]) 05/24/2017  . Insulin resistance 05/24/2017  . Acanthosis 05/24/2017    Zachery Conch MOT, OTR/L  04/07/2020, 2:31 PM  [facility] [address] [place], [place], [address] Phone: [phone]   Fax:  [phone]  Name: Min Collymore MRN: [id number] Date of Birth: 2000-07-17

## 2020-04-08 ENCOUNTER — Telehealth: Payer: Self-pay | Admitting: *Deleted

## 2020-04-08 NOTE — Telephone Encounter (Signed)
Per Dr. Lucia Gaskins, start IVIG with loading dose of 2 g/kg (actual body weight) over 3 days via IV followed by 1 g/kg (actual body weight) over 1-2 days as tolerated by patient every 4 weeks via IV. Pre-treatment of tylenol PO 650 mg x1 as well as Benadryl 50 mg PO x 1.    IVIG order form completed. No preference in IVIG brand except that consideration needs to be made for pt's type I Diabetes. I have included this on the order form. This was signed by Dr Lucia Gaskins and faxed to [facility] [phone]) along with office notes, results of all labs, imaging, and procedures ordered by Dr Lucia Gaskins. Received a receipt of confirmation.

## 2020-04-08 NOTE — Telephone Encounter (Signed)
Received order for bilateral AFO for Dr Lucia Gaskins to sign. Orders signed and  Faxed back to [facility]. Received a receipt of confirmation.

## 2020-04-14 ENCOUNTER — Ambulatory Visit: Payer: Medicaid Other | Admitting: Occupational Therapy

## 2020-04-14 ENCOUNTER — Ambulatory Visit: Payer: Medicaid Other | Admitting: Rehabilitation

## 2020-04-15 LAB — FUNGUS CULTURE W SMEAR
CULTURE:: NO GROWTH
MICRO NUMBER:: 11486690
SMEAR:: NONE SEEN
SPECIMEN QUALITY:: ADEQUATE

## 2020-04-15 LAB — CSF CULTURE W GRAM STAIN
MICRO NUMBER:: 11486691
Result:: NO GROWTH
SPECIMEN QUALITY:: ADEQUATE

## 2020-04-15 LAB — PROTEIN, CSF: Total Protein, CSF: 31 mg/dL (ref 15–45)

## 2020-04-15 LAB — BORRELIA SPECIES DNA, FLUID, PCR: B. burgdorferi DNA: NOT DETECTED

## 2020-04-15 LAB — CSF CELL COUNT WITH DIFFERENTIAL
Basophils, %: 0 %
Lymphs, CSF: 96 % — ABNORMAL HIGH (ref 40–80)
Monocyte/Macrophage: 2 % — ABNORMAL LOW (ref 15–45)
RBC Count, CSF: 263 cells/uL — ABNORMAL HIGH
Segmented Neutrophils-CSF: 2 % (ref 0–6)
WBC, CSF: 16 cells/uL — ABNORMAL HIGH (ref 0–5)

## 2020-04-15 LAB — GLUCOSE, CSF: Glucose, CSF: 187 mg/dL — ABNORMAL HIGH (ref 40–80)

## 2020-04-15 LAB — EXTRA SPECIMEN

## 2020-04-15 LAB — VDRL, CSF: VDRL Quant, CSF: NONREACTIVE

## 2020-04-15 NOTE — Telephone Encounter (Signed)
Late entry from 04/14/20. [facility] drew up loading dose orders for Dr Lucia Gaskins to sign and faxed them to our office. Orders were signed by Dr Lucia Gaskins and faxed back to [facility].    Today I received a message from [place] w/ [facility] stating patient has been approved for IVIG with $0 OOP pay for patient. She stated nursing was being setup for the pt.

## 2020-04-16 NOTE — Progress Notes (Deleted)
Endocrinology provider: Dr. Quincy Sheehan (upcoming appt 04/21/2020 10:30 am)  Dietitian: [name], RD (no upcoming appt) -Previous appts on 06/10/18, 02/25/19, and 12/22/2019  Behavioral health specialist: Dr. Huntley Dec (upcoming appt 04/21/2020 11:30 am) -No prior appt  Patient referred by Dr. Fransico Michael for diabetes education. PMH is significant for T1DM with severe insulin resistance, acanthosis, diabetic polyneuropathy, morbid obesity, prior hx of COVID-19 infection 11/18/2019), and depression. Patient was lost to follow up from 02/25/19 to 12/03/19. At prior appt with Dr. Fransico Michael on 12/03/19, multiple issues were discussed. Patient states Spring/Summer 2021 he lost focus on his DM management due to how his mother passed away (now living with his grandmother). He has been guessing at his Novolog doses (not following the sliding scale plan provided by Dr Vanessa Glencoe on 11/04/18). He has SEVERE neuropathy; he can't stand up from a sitting position without using his arms for support and he also experiences numbness and loss of strength in the palm of his right hand. He was previously followed by Dr. Devonne Doughty, but has "aged out" of that practice. He was a no show for two NP appointments at [facility], once in October 18, 2022 when his mother died and once in 11/18/22 when hew was admitted with DKA. Dr. Fransico Michael asked the grandmother to call [facility] and obtain a new appointment for him. Patient did mention though at prior appt with Dr Fransico Michael that he wants help in managing his DM. Dr. Fransico Michael advised patient to continue Tresiba 100 units daily (200 units/mL pen) and to re-start Novolog sliding scale + fixed dose of Novolog 10 units three times daily prior to meals. Dr. Fransico Michael would like to transition patient to a 2 component plan (carb + correction factor) for rapid acting insulin in the future.  At prior appt on 03/04/20, patient changed from office visit to virtual visit due to feeling sick. He stated he had  a runny nose and sore throat. He denied abdominal pain, Kussamaul respirations, fruity breath. He last checked his ketones on 03/02/20, which were trace. He was unable leave house right now to get COVID-19 tested due to ice on his porch. He was having issues with adherence with [place] and Novolog. He had was takiing TDD 130 units. BG were 97% very high, 3% high per Dexcom Clarity report. Increased TDD 130 --> 156 (20% increase); new basal dose was [place] U200 66 units daily and new bolus dose will be Humalog U200 30 units three times daily (10:30 AM, 3:30 PM, 6:30 PM). Continued metformin 1 g BID. Assisted patient download realarm app onto his computer to assist with adherence. Also discussed plan with grandmother who wrote instructions down to also assist with adherence. Emailed patient information about COVID-19 testing sites for when weather improved as well as at home COVID-19 testing incase weather did not improve.  ***  School: not in school right now; planning to re-enrol in [employer] (he is unsure of time of re-enroll)  Insurance Coverage: Managed Medicaid [facility] plan; ID # [id number])  Diabetes Diagnosis: 04/23/18  Family History: T2DM (maternal grandmother, paternal grandfather); T1DM (paternal grandmother); DM (uncle)  Preferred Pharmacy [facility] [phone] - [place], [place] - [address] AT [facility] [address]  [address] [place] [address]  Phone:  [phone] Fax:  [phone]  DEA #:  [id number]  Medication Adherence -Patient denies consistent adherence adherence with [name] and Afrezza/Humalog -Current diabetes medications include: Tresiba 66 units daily (200 units/mL pen), HumalogU200 (times/dosing below),  metformin XR 500 mg daily after breakfast and 1000 mg daily after dinner i. Humalog ii. 10:30 AM: 30 units iii. 3:30 PM: 30 units iv. 6:30 PM: 30 units -Prior diabetes medications include: Afrezza (more challenging to use  considering patient's dexterity)  Injection Sites (*** changes since prior appt on 03/04/2020) -Patient-reports injection sites are right arm, abdomen, right leg --Patient reports independently injecting DM medications. --Patient reports rotating injection sites  Diet (*** changes since prior appt on 03/04/2020) Patient reported dietary habits (gets $90 in food stamps each month) Eats 2 meals/day and 1 snacks/day Breakfast (8:30-9:00 am): 3 eggs with 2 pieces of whole wheat nature's own brand toast, no [place] breast or bologna Lunch ("anytime"; 12-1PM or sometimes ~3-4PM): sandwich (mayonaise, bologna, cheese) Dinner (10-11pm): skips Snacks: no snacks  Drinks: water (24 bottle case in 2-3 days), a little bit of milk  Exercise (*** changes since prior appt on 03/04/2020) Patient-reported exercise habits: walks around house (10 min at a time, 2x daily), sometimes walk around porch; sometimes leg kicks in bed everyday for 5 min, doing exercises recommended by PT (using band, marching) does for 5 minutes each day    Dexcom Clarity Report ***   There were no vitals filed for this visit.  Lab Results  Component Value Date   HGBA1C >14 01/26/2020   HGBA1C 14.4 (H) 10/21/2019   HGBA1C 14.0 (A) 02/25/2019   HGBA1C >14 02/25/2019    Lab Results  Component Value Date   CPEPTIDE 3.8 04/23/2018       Assessment:  Patient's goal with DM management: "Get diabetes controlled as independently as possible"  Medication Management/monitoring:  ***  I am concerned for patient as I follow with him since October 2021 almost weekly and patient struggles to follow directions even though I provide written handout and/or email him a copy of instructions and he will repeat back instructions to me prior to ending appointment, however, he often forgets what he was instructed to do. He is very adamant about being independent with his care, but I feel like it is challenging for him. I wonder if lack  of adherence could possibly be due to underlying psychiatric or neurologic condition.    Plan: 2. Medications:  a. *** Tresiba U200 66 units daily b. *** Humalog U200  i. 30 units three times daily (10:30 am, 3:30 pm, 6:30 pm) c. *** metformin 1000 mg twice daily 3. Monitoring a. Continue Dexcom G6 CGM b. Provided Dexcom sensor/transmitter sample c. Misael Mcgaha has a diagnosis of diabetes, checks blood glucose readings > 4x per day, treats with > 3 insulin injections, and requires frequent adjustments to insulin regimen. This patient will be seen every six months, minimally, to assess adherence to their CGM regimen and diabetes treatment plan 4. Follow Up: ***  This appointment required *** minutes of patient care (this includes precharting, chart review, review of results, virtual care, etc.).  Thank you for involving clinical pharmacist/diabetes educator to assist in providing this patient's care.  Zachery Conch, PharmD, CPP, CDCES

## 2020-04-16 NOTE — Telephone Encounter (Signed)
No they take care of everything.

## 2020-04-16 NOTE — Telephone Encounter (Signed)
Awesome! Do we have to do anything to get it started?

## 2020-04-19 ENCOUNTER — Ambulatory Visit: Payer: Medicaid Other | Admitting: Rehabilitation

## 2020-04-20 ENCOUNTER — Telehealth (INDEPENDENT_AMBULATORY_CARE_PROVIDER_SITE_OTHER): Payer: Self-pay | Admitting: Pharmacist

## 2020-04-20 ENCOUNTER — Ambulatory Visit: Payer: Medicaid Other | Admitting: Physical Therapy

## 2020-04-20 ENCOUNTER — Ambulatory Visit: Payer: Medicaid Other | Admitting: Occupational Therapy

## 2020-04-20 NOTE — Telephone Encounter (Signed)
Who's calling (name and relationship to patient) : Lucas Cunningham   Best contact number: [phone]  Provider they see: Dr. Ladona Ridgel   Reason for call: Calling to speak with Dr. Ladona Ridgel  Call ID:      PRESCRIPTION REFILL ONLY  Name of prescription:  Pharmacy:

## 2020-04-21 ENCOUNTER — Other Ambulatory Visit (INDEPENDENT_AMBULATORY_CARE_PROVIDER_SITE_OTHER): Payer: Medicaid Other | Admitting: Pharmacist

## 2020-04-21 ENCOUNTER — Encounter (INDEPENDENT_AMBULATORY_CARE_PROVIDER_SITE_OTHER): Payer: Medicaid Other | Admitting: Psychology

## 2020-04-21 ENCOUNTER — Ambulatory Visit (INDEPENDENT_AMBULATORY_CARE_PROVIDER_SITE_OTHER): Payer: Medicaid Other | Admitting: Pediatrics

## 2020-04-22 ENCOUNTER — Ambulatory Visit: Payer: Medicaid Other | Admitting: Rehabilitation

## 2020-04-23 ENCOUNTER — Ambulatory Visit: Payer: Medicaid Other | Admitting: Physical Therapy

## 2020-04-23 NOTE — Progress Notes (Deleted)
Endocrinology provider: Dr. Quincy Sheehan (upcoming appt 04/21/2020 10:30 am)  Dietitian: [name], RD (no upcoming appt) -Previous appts on 06/10/18, 02/25/19, and 12/22/2019  Behavioral health specialist: Dr. Huntley Dec (upcoming appt 04/21/2020 11:30 am) -No prior appt  Patient referred by Dr. Fransico Michael for diabetes education. PMH is significant for T1DM with severe insulin resistance, acanthosis, diabetic polyneuropathy, morbid obesity, prior hx of COVID-19 infection 2019-11-03), and depression. Patient was lost to follow up from 02/25/19 to 12/03/19. At prior appt with Dr. Fransico Michael on 12/03/19, multiple issues were discussed. Patient states Spring/Summer 2021 he lost focus on his DM management due to how his mother passed away (now living with his grandmother). He has been guessing at his Novolog doses (not following the sliding scale plan provided by Dr Vanessa Shickshinny on 11/04/18). He has SEVERE neuropathy; he can't stand up from a sitting position without using his arms for support and he also experiences numbness and loss of strength in the palm of his right hand. He was previously followed by Dr. Devonne Doughty, but has "aged out" of that practice. He was a no show for two NP appointments at [facility], once in 10/03/22 when his mother died and once in 11/03/2022 when hew was admitted with DKA. Dr. Fransico Michael asked the grandmother to call [facility] and obtain a new appointment for him. Patient did mention though at prior appt with Dr Fransico Michael that he wants help in managing his DM. Dr. Fransico Michael advised patient to continue Tresiba 100 units daily (200 units/mL pen) and to re-start Novolog sliding scale + fixed dose of Novolog 10 units three times daily prior to meals. Dr. Fransico Michael would like to transition patient to a 2 component plan (carb + correction factor) for rapid acting insulin in the future.  At prior appt on 03/04/20, patient changed from office visit to virtual visit due to feeling sick. He stated he had  a runny nose and sore throat. He denied abdominal pain, Kussamaul respirations, fruity breath. He last checked his ketones on 03/02/20, which were trace. He was unable leave house right now to get COVID-19 tested due to ice on his porch. He was having issues with adherence with [place] and Novolog. He had was takiing TDD 130 units. BG were 97% very high, 3% high per Dexcom Clarity report. Increased TDD 130 --> 156 (20% increase); new basal dose was [place] U200 66 units daily and new bolus dose will be Humalog U200 30 units three times daily (10:30 AM, 3:30 PM, 6:30 PM). Continued metformin 1 g BID. Assisted patient download realarm app onto his computer to assist with adherence. Also discussed plan with grandmother who wrote instructions down to also assist with adherence. Emailed patient information about COVID-19 testing sites for when weather improved as well as at home COVID-19 testing incase weather did not improve.  ***  School: not in school right now; planning to re-enrol in [employer] (he is unsure of time of re-enroll)  Insurance Coverage: Managed Medicaid [facility] plan; ID # [id number])  Diabetes Diagnosis: 04/23/18  Family History: T2DM (maternal grandmother, paternal grandfather); T1DM (paternal grandmother); DM (uncle)  Preferred Pharmacy [facility] [phone] - [place], [place] - [address] AT [facility] OF [address]  [address] [place] [address]  Phone:  [phone] Fax:  [phone]  DEA #:  [id number]  Medication Adherence -Patient denies consistent adherence adherence with [name] and Afrezza/Humalog -Current diabetes medications include: Tresiba 66 units daily (200 units/mL pen), HumalogU200 (times/dosing below),  metformin XR 500 mg daily after breakfast and 1000 mg daily after dinner i. Humalog ii. 10:30 AM: 30 units iii. 3:30 PM: 30 units iv. 6:30 PM: 30 units -Prior diabetes medications include: Afrezza (more challenging to use  considering patient's dexterity)  Injection Sites (*** changes since prior appt on 03/04/2020) -Patient-reports injection sites are right arm, abdomen, right leg --Patient reports independently injecting DM medications. --Patient reports rotating injection sites  Diet (*** changes since prior appt on 03/04/2020) Patient reported dietary habits (gets $90 in food stamps each month) Eats 2 meals/day and 1 snacks/day Breakfast (8:30-9:00 am): 3 eggs with 2 pieces of whole wheat nature's own brand toast, no turkey breast or bologna Lunch ("anytime"; 12-1PM or sometimes ~3-4PM): sandwich (mayonaise, bologna, cheese) Dinner (10-11pm): skips Snacks: no snacks  Drinks: water (24 bottle case in 2-3 days), a little bit of milk  Exercise (*** changes since prior appt on 03/04/2020) Patient-reported exercise habits: walks around house (10 min at a time, 2x daily), sometimes walk around porch; sometimes leg kicks in bed everyday for 5 min, doing exercises recommended by PT (using band, marching) does for 5 minutes each day    Dexcom Clarity Report ***   There were no vitals filed for this visit.  Lab Results  Component Value Date   HGBA1C >14 01/26/2020   HGBA1C 14.4 (H) 10/21/2019   HGBA1C 14.0 (A) 02/25/2019   HGBA1C >14 02/25/2019    Lab Results  Component Value Date   CPEPTIDE 3.8 04/23/2018       Assessment:  Patient's goal with DM management: "Get diabetes controlled as independently as possible"  Medication Management/monitoring:  ***  I am concerned for patient as I follow with him since October 2021 almost weekly and patient struggles to follow directions even though I provide written handout and/or email him a copy of instructions and he will repeat back instructions to me prior to ending appointment, however, he often forgets what he was instructed to do. He is very adamant about being independent with his care, but I feel like it is challenging for him. I wonder if lack  of adherence could possibly be due to underlying psychiatric or neurologic condition.    Plan: 2. Medications:  a. *** Tresiba U200 66 units daily b. *** Humalog U200  i. 30 units three times daily (10:30 am, 3:30 pm, 6:30 pm) c. *** metformin 1000 mg twice daily 3. Monitoring a. Continue Dexcom G6 CGM b. Provided Dexcom sensor/transmitter sample c. [place] Dillin has a diagnosis of diabetes, checks blood glucose readings > 4x per day, treats with > 3 insulin injections, and requires frequent adjustments to insulin regimen. This patient will be seen every six months, minimally, to assess adherence to their CGM regimen and diabetes treatment plan 4. Follow Up: ***  This appointment required *** minutes of patient care (this includes precharting, chart review, review of results, virtual care, etc.).  Thank you for involving clinical pharmacist/diabetes educator to assist in providing this patient's care.  Shironda Kain, PharmD, CPP, CDCES  

## 2020-04-26 ENCOUNTER — Ambulatory Visit: Payer: Medicaid Other | Admitting: Rehabilitation

## 2020-04-26 ENCOUNTER — Ambulatory Visit: Payer: Medicaid Other | Admitting: Occupational Therapy

## 2020-04-28 ENCOUNTER — Ambulatory Visit: Payer: Medicaid Other | Admitting: Physical Therapy

## 2020-04-30 ENCOUNTER — Other Ambulatory Visit (INDEPENDENT_AMBULATORY_CARE_PROVIDER_SITE_OTHER): Payer: Medicaid Other | Admitting: Pharmacist

## 2020-05-03 ENCOUNTER — Ambulatory Visit: Payer: Medicaid Other | Admitting: Occupational Therapy

## 2020-05-03 ENCOUNTER — Encounter: Payer: Self-pay | Admitting: Rehabilitation

## 2020-05-03 ENCOUNTER — Other Ambulatory Visit: Payer: Self-pay

## 2020-05-03 ENCOUNTER — Ambulatory Visit: Payer: Medicaid Other | Attending: Pediatrics | Admitting: Rehabilitation

## 2020-05-03 DIAGNOSIS — M21371 Foot drop, right foot: Secondary | ICD-10-CM | POA: Diagnosis present

## 2020-05-03 DIAGNOSIS — R29818 Other symptoms and signs involving the nervous system: Secondary | ICD-10-CM | POA: Insufficient documentation

## 2020-05-03 DIAGNOSIS — R208 Other disturbances of skin sensation: Secondary | ICD-10-CM | POA: Diagnosis present

## 2020-05-03 DIAGNOSIS — R278 Other lack of coordination: Secondary | ICD-10-CM | POA: Diagnosis present

## 2020-05-03 DIAGNOSIS — M25531 Pain in right wrist: Secondary | ICD-10-CM | POA: Insufficient documentation

## 2020-05-03 DIAGNOSIS — M6281 Muscle weakness (generalized): Secondary | ICD-10-CM

## 2020-05-03 DIAGNOSIS — M21372 Foot drop, left foot: Secondary | ICD-10-CM | POA: Insufficient documentation

## 2020-05-03 DIAGNOSIS — R2681 Unsteadiness on feet: Secondary | ICD-10-CM | POA: Diagnosis present

## 2020-05-03 DIAGNOSIS — R2689 Other abnormalities of gait and mobility: Secondary | ICD-10-CM | POA: Insufficient documentation

## 2020-05-03 NOTE — Therapy (Signed)
[facility] [address] [place], [place], [address] Phone: [phone]   Fax:  [phone]  Physical Therapy Treatment  Patient Details  Name: Lucas Cunningham MRN: [id number] Date of Birth: 2000-11-09 Referring Provider (PT): [name]   Encounter Date: 05/03/2020   PT End of Session - 05/03/20 1251    Visit Number 14    Number of Visits 23    Authorization Type [employer]    Authorization Time Period 16 visits 12/8-2/02/22 - awaiting auth for more visits (used 9 visits), 12 visits approved from 2/16-5/27    Authorization - Visit Number 3    Authorization - Number of Visits 12    PT Start Time 7673    PT Stop Time 1232    PT Time Calculation (min) 45 min    Equipment Utilized During Treatment Gait belt    Activity Tolerance Patient tolerated treatment well    Behavior During Therapy [facility] for tasks assessed/performed           Past Medical History:  Diagnosis Date  . Diabetes mellitus without complication ([place])    type 1    Past Surgical History:  Procedure Laterality Date  . NO PAST SURGERIES      There were no vitals filed for this visit.   Subjective Assessment - 05/03/20 1152    Subjective Has noticed improvement in LEs since starting IVIG, can't really tell a huge difference in hands yet.    Limitations Walking;Standing;House hold activities    Patient Stated Goals "I want to be able to bring in groceries and help around the house."    Currently in Pain? Yes    Pain Score 7     Pain Location Hand    Pain Orientation Right;Left    Pain Descriptors / Indicators Aching;Throbbing    Pain Type Neuropathic pain;Acute pain   Has gotten worse since IVIG   Pain Onset 1 to 4 weeks ago    Pain Frequency Intermittent    Aggravating Factors  cold air, hypersensitive    Pain Relieving Factors sleeping, being warm                             [facility] Adult PT  Treatment/Exercise - 05/03/20 1200      Transfers   Transfers Sit to Stand;Stand to Sit    Sit to Stand 5: Supervision    Sit to Stand Details (indicate cue type and reason) Heavy reliance on UEs on mat and backs of legs on mat for stability    Five time sit to stand comments  26.10 secs with BUE support    Stand to Sit 5: Supervision    Stand to Sit Details (indicate cue type and reason) Verbal cues for sequencing;Verbal cues for technique      Ambulation/Gait   Ambulation/Gait Yes    Ambulation/Gait Assistance 5: Supervision    Ambulation/Gait Assistance Details Pt ambulated into clinic and x 115' without AFOs to assess gait as of late as he has not been seen in approx 1 month.  Note that he reports R hand at times slips off RW making it difficult to ambulate at home.  Therefore OT present in session helping with R hand orthosis.  Provided with longer strap so that pt is able to do on his own.  Then Brooke Pace Present during session to provide pt with B anterior [place] AFOs.  Donned and  ambulated x 230' at S level with markedly increased gait speed vs without AFOs.  Assessed gait speed, see below.  Min cues for upright posture and increased hip extension throughout.    Ambulation Distance (Feet) 115 Feet   then 230'   Assistive device Rolling walker   B anterior [place] AFOs   Gait Pattern Step-through pattern;Decreased stride length;Decreased dorsiflexion - right;Decreased dorsiflexion - left;Right steppage;Left steppage;Wide base of support;Trunk flexed    Gait velocity 2.44 ft/sec with B AFOs and RW      Standardized Balance Assessment   Standardized Balance Assessment Timed Up and Go Test      Timed Up and Go Test   TUG Normal TUG    Normal TUG (seconds) 28.88   secs with RW and B AFOs                   PT Short Term Goals - 05/03/20 1208      PT SHORT TERM GOAL #1   Title Pt will obtain B AFOs in order to to decrease fall risk and improve efficiency of gait. ALL STGS  DUE 04/18/20    Baseline received 05/03/20    Time 3    Period Weeks    Status Achieved    Target Date 04/18/20      PT SHORT TERM GOAL #2   Title Pt will be able to stand statically without support x10  secs at RW with min guard in order to indicate improved balance and strength.    Baseline unable to let go of RW today 05/03/20    Time 3    Period Weeks    Status Not Met      PT SHORT TERM GOAL #3   Title Pt will improve gait speed to >/= 2.16 ft/sec w/ LRAD and B AFO as needed to indicate dec fall risk.    Baseline 2.44 ft/sec with B AFOs and RW    Time 3    Period Weeks    Status Achieved      PT SHORT TERM GOAL #4   Title Pt will improve 5TSS to </=18 secs with BUE support in order to indicate improved strength.    Baseline 26.10 secs with BUE support from mat table 05/03/20    Time 3    Period Weeks    Status Not Met      PT SHORT TERM GOAL #5   Title Pt will decr TUG with B AFOs and RW to 17 seconds or less in order to demo decr fall risk.    Baseline 28.88 secs with B AFOs and RW    Time 3    Period Weeks    Status Not Met             PT Long Term Goals - 03/28/20 1622      PT LONG TERM GOAL #1   Title Pt will be IND with final HEP in order to indicate improved functional mobility and dec fall risk.  ALL LTGS DUE 05/09/20    Baseline independent with current HEP, will continue to benefit from additions    Time 6    Period Weeks    Status New    Target Date 05/09/20      PT LONG TERM GOAL #2   Title Pt will improve gait speed to >/=2.62 ft/sec with LRAD and B AFOs as needed in order to indicate safe community ambulation.    Baseline 17 seconds = 1.92  ft/sec with RW and no AFOs on 03/26/20    Time 6    Period Weeks    Status On-going      PT LONG TERM GOAL #3   Title Pt will improve 5TSS to </=16 secs with single UE support only in order to indicate improved functional strength.    Baseline 21.75 secs with BUE support, 21.34 for 2nd attempt with BUE support  from mat table on 03/26/20    Time 6    Period Weeks    Status On-going      PT LONG TERM GOAL #4   Title Pt will ambulate x 500' over unlevel paved surfaces w/ LRAD and B AFOs as needed at mod I level in order to indicate safe community mobility.    Baseline due to time constraints, unable to assess on 03/26/20    Time 6    Period Weeks    Status On-going      PT LONG TERM GOAL #5   Title Pt will perform dynamic standing balance with intermittent UE support x 3 mins in order to perform ADLs safely at home.    Baseline on 03/26/20: static standing needing UE support, able to let go for approx. 5 seconds at a time, stood for a total on 2 minutes and 30 seconds    Time 6    Period Weeks    Status Revised                 Plan - 05/03/20 1253    Clinical Impression Statement Skilled session focused on re-assessment of STGs as pt has not been seen in approx 1 month.  He demonstrates decline on almost all outcome measures despite getting B AFOs today.  He reports he has had a decline in mobility at home and sleeps a lot.  Recommend he begin a wear schedule with AFOs and get up every hour at home and walk around the house to increase mobility.  Also recommend that he contact Dr. Ferdinand Lango office regarding nerve pain as he is not taking anything for nerve pain at this time and this is a limiting factor for him coming to therapy.  Scheduled him through April 15th (next infusion) to address deficits, but discussed with pt and grandmother that he will need to come consistently or we will have to take him off of our schedule.  Pt and grandmother verbalized understanding.    Personal Factors and Comorbidities Comorbidity 3+;Time since onset of injury/illness/exacerbation    Comorbidities see above    Examination-Activity Limitations Bathing;Dressing;Locomotion Level;Transfers;Squat;Stairs;Stand    Examination-Participation Restrictions Community Activity;Driving;School;Yard Work   Chemical engineer Evolving/Moderate complexity    Rehab Potential Good    PT Frequency 2x / week    PT Duration 6 weeks    PT Treatment/Interventions ADLs/Self Care Home Management;DME Instruction;Gait training;Stair training;Functional mobility training;Therapeutic activities;Therapeutic exercise;Balance training;Neuromuscular re-education;Patient/family education;Orthotic Fit/Training;Passive range of motion;Vestibular    PT Next Visit Plan He now has anterior Thuasne AFOs but does well with them so kept them.  Has diabetic shoe app[facility] at [facility] on 4/7 so can continue to remind him of this.  Work towards [employer] as I only scheduled him through next infusion date to avoid missing so many appts.  Continue BLE strength, activity tolerance and balance.    Consulted and Agree with Plan of Care Patient    Family Member Consulted grandmother           Patient will benefit from  skilled therapeutic intervention in order to improve the following deficits and impairments:  Abnormal gait,Decreased activity tolerance,Decreased balance,Decreased endurance,Decreased knowledge of precautions,Decreased knowledge of use of DME,Decreased mobility,Decreased safety awareness,Decreased strength,Difficulty walking,Impaired perceived functional ability,Impaired sensation,Impaired UE functional use,Postural dysfunction  Visit Diagnosis: Muscle weakness (generalized)  Other abnormalities of gait and mobility  Unsteadiness on feet  Foot drop, left  Foot drop, right     Problem List Patient Active Problem List   Diagnosis Date Noted  . Autonomic neuropathy associated with type 1 diabetes mellitus ([place]) 12/03/2019  . DKA, type 1 ([place]) 10/23/2019  . DKA (diabetic ketoacidosis) ([place]) 10/21/2019  . COVID-19 virus infection 10/21/2019  . Diabetic polyneuropathy associated with type 1 diabetes mellitus ([place]) 02/26/2019  . Leg weakness, bilateral 02/25/2019  . Postprandial bloating 02/25/2019  .  Sensory problems with limbs 02/25/2019  . Ketonuria 11/04/2018  . Diabetes ([place]) 04/23/2018  . Uncontrolled type 1 diabetes mellitus with hyperglycemia ([place]) 05/24/2017  . Morbid childhood obesity with BMI greater than 99th percentile for age [facility]) 05/24/2017  . Insulin resistance 05/24/2017  . Acanthosis 05/24/2017    Cameron Sprang, PT, MPT [facility] [address] [place], [place], [address] Phone: [phone]   Fax:  [phone] 05/03/20, 1:01 PM  Name: Lucas Cunningham MRN: [id number] Date of Birth: [birth year]

## 2020-05-05 ENCOUNTER — Ambulatory Visit: Payer: Medicaid Other | Admitting: Physical Therapy

## 2020-05-05 ENCOUNTER — Encounter: Payer: Self-pay | Admitting: Physical Therapy

## 2020-05-05 ENCOUNTER — Encounter: Payer: Self-pay | Admitting: Occupational Therapy

## 2020-05-05 ENCOUNTER — Ambulatory Visit: Payer: Medicaid Other | Admitting: Occupational Therapy

## 2020-05-05 ENCOUNTER — Other Ambulatory Visit: Payer: Self-pay

## 2020-05-05 DIAGNOSIS — R29818 Other symptoms and signs involving the nervous system: Secondary | ICD-10-CM

## 2020-05-05 DIAGNOSIS — M6281 Muscle weakness (generalized): Secondary | ICD-10-CM

## 2020-05-05 DIAGNOSIS — R278 Other lack of coordination: Secondary | ICD-10-CM

## 2020-05-05 DIAGNOSIS — R2689 Other abnormalities of gait and mobility: Secondary | ICD-10-CM

## 2020-05-05 DIAGNOSIS — M25531 Pain in right wrist: Secondary | ICD-10-CM

## 2020-05-05 DIAGNOSIS — R2681 Unsteadiness on feet: Secondary | ICD-10-CM

## 2020-05-05 DIAGNOSIS — R208 Other disturbances of skin sensation: Secondary | ICD-10-CM

## 2020-05-05 DIAGNOSIS — M21372 Foot drop, left foot: Secondary | ICD-10-CM

## 2020-05-05 DIAGNOSIS — M21371 Foot drop, right foot: Secondary | ICD-10-CM

## 2020-05-05 NOTE — Therapy (Signed)
[facility] [address] [place], [place], [address] Phone: [phone]   Fax:  [phone]  Occupational Therapy Treatment  Patient Details  Name: Lucas Cunningham MRN: [id number] Date of Birth: 2000-08-06 Referring Provider (OT): Percell Miller, NP   Encounter Date: 05/05/2020   OT End of Session - 05/05/20 1500    Visit Number 13    Number of Visits 18    Date for OT Re-Evaluation 03/11/20    Authorization Type [employer]    Authorization Time Period (Week 8 of 8 in POC)Patient has not been seen since 04/07/20 6 OT visits approved 03/26/2020 - 05/12/20    Authorization - Visit Number 4    Authorization - Number of Visits 6    OT Start Time 1406    OT Stop Time 1445    OT Time Calculation (min) 39 min    Activity Tolerance Patient tolerated treatment well    Behavior During Therapy [facility] for tasks assessed/performed           Past Medical History:  Diagnosis Date  . Diabetes mellitus without complication ([place])    type 1    Past Surgical History:  Procedure Laterality Date  . NO PAST SURGERIES      There were no vitals filed for this visit.   Subjective Assessment - 05/05/20 1414    Subjective  Denies pain, feels his legs have improved since IVIG, has not noticed a change in his upper extremities    Pertinent History PMH Type I diabetes, depression, peripheral neuropathy    Limitations Fall Risk. Sensation deficits in RUE    Patient Stated Goals Pt wants "to be able to grab things and write and use my fingers"    Currently in Pain? No/denies    Pain Score 0-No pain                        OT Treatments/Exercises (OP) - 05/05/20 0001      ADLs   Eating Patient issued red cylindrical foam to add to feeding utensils.  Patient able to pick up fork with right hand if wearing dorsal wrist cock up splint.    LB Dressing Patient issued elastic shoelaces as he is unable to tie shoes, and is  walking with laces dragging.  Laces may or may not work with new AFO's - patient was not wearing braces he received on Monday.      Wrist Exercises   Other wrist exercises Right wrist extension in gravity eliminated position - only trace activation noted, inconsistently.  Limited wrist extension noted in Left wrist as well - may benefit from wrist support for LUE as well.  With right brace on, patient better able to pick up and release 1 inch blocks.  Patient lacks PIP flexion in index finger on left hand, making prehensile skills challenging.                  OT Education - 05/05/20 1500    Education Details elastic shoe laces and cylindrical foam to build up utensils    Person(s) Educated Patient    Methods Explanation;Demonstration;Tactile cues;Verbal cues    Comprehension Verbalized understanding;Need further instruction            OT Short Term Goals - 05/05/20 1505      OT SHORT TERM GOAL #1   Title Pt will be independent with initial HEP targeting grip strength and range of motion  Baseline not issued yet    Time 4    Period Weeks    Status On-going    Target Date 02/12/20      OT SHORT TERM GOAL #2   Title Pt will verbalize understanding of sensory strategies for RUE and safety    Baseline have not reviewed    Time 4    Period Weeks    Status Achieved   received education on sensory strategies and safety for poor sensation in BUE     OT SHORT TERM GOAL #3   Title Pt will increase grip strength in LUE by 5 lbs and RUE by 2 lbs for increase in functional use of BUE for ADLs and IADLs    Baseline RUE 5 LUE 29.5    Time 4    Period Weeks    Status On-going   04/07/20 LUE 14.5 lbs RUE 2.4 lbs     OT SHORT TERM GOAL #4   Title Pt will perform donning and doffing socks and shoes with supervision for increase in independence with ADLs    Baseline unable to don socks consistently    Time 4    Period Weeks    Status Partially Met   04/07/20 - pt is not able to put  socks on without physical assistance but able to put shoes on.     OT SHORT TERM GOAL #5   Title Pt will perform simple cold meal prep (sandwich) and/or light housekeeping with supervision    Baseline not completing at this time    Time 4    Period Weeks    Status Achieved   04/07/20 - pt reports making cereal but not sandwich and participating in some home management            OT Long Term Goals - 05/05/20 1505      OT LONG TERM GOAL #1   Title Pt will be independent with updated HEP 03/11/2020    Baseline not issued yet    Time 8    Period Weeks    Status On-going      OT LONG TERM GOAL #2   Title Pt will perform simple warm meal prep and/or light house keeping task with mod I and good safety awareness    Baseline not completing at eval    Time 8    Period Weeks    Status Deferred   cooked an egg with min to mod A but demonstrating much difficulty with BUE use for tasks     OT LONG TERM GOAL #3   Title Pt will improve grip strengthening in LUE by 10 lbs and RUE by 6 lbs for increased ability to perform clothing management for ADLs    Baseline LUE 29.5 RUE 5    Time 8    Period Weeks    Status New   will see how BUE respond to IVIG if approved for CIDP     OT LONG TERM GOAL #4   Title Pt will increased box and blocks score in RUE to 12 blocks or more for increase in functional use of RUE    Baseline RUE 6    Time 8    Period Weeks    Status On-going   04/07/20 - 7 blocks     OT LONG TERM GOAL #5   Title Pt will report decrease pain in RUE wrist with functional use to no more than 6/10    Baseline 9/10  Time 8    Period Weeks    Status Achieved   3/10     OT LONG TERM GOAL #6   Title Pt will verbalize understanding of adapted equipment and strategies PRN for basic ADLs and IADLs.    Baseline difficulty with donning socks, cutting up food and fasteners    Time 8    Period Weeks    Status Revised                 Plan - 05/05/20 1502    Clinical  Impression Statement Patient seems to have further decline in strength and active movement in LUE despite recent IVIG.    OT Occupational Profile and History Problem Focused Assessment - Including review of records relating to presenting problem    Occupational performance deficits (Please refer to evaluation for details): IADL's;ADL's;Leisure;Social Participation    Body Structure / Function / Physical Skills ADL;Balance;Coordination;Decreased knowledge of use of DME;Edema;Flexibility;FMC;Dexterity;Strength;GMC;Pain;Tone;UE functional use;ROM;IADL;Sensation    Psychosocial Skills Coping Strategies    Rehab Potential Good    Clinical Decision Making Limited treatment options, no task modification necessary    Comorbidities Affecting Occupational Performance: None    Modification or Assistance to Complete Evaluation  No modification of tasks or assist necessary to complete eval    OT Frequency 1x / week    OT Duration 6 weeks   1x/week for 6 weeks after renewal   OT Treatment/Interventions Self-care/ADL training;Moist Heat;DME and/or AE instruction;Splinting;Balance training;Therapeutic activities;Aquatic Therapy;Therapeutic exercise;Ultrasound;Electrical Stimulation;Cryotherapy;Neuromuscular education;Paraffin;Energy conservation;Manual Therapy;Patient/family education;Passive range of motion;Functional Mobility Training    Plan right splint made.(in box under laptop on my desk), work on tip and lateral pinch with splint on RUE, May benefit from L dorsal wrist cock up as well.  Address index PIP flexion left    Recommended Other Services Pt is receiving PT    Consulted and Agree with Plan of Care Patient;Family member/caregiver    Family Member Consulted grandmother           Patient will benefit from skilled therapeutic intervention in order to improve the following deficits and impairments:   Body Structure / Function / Physical Skills: ADL,Balance,Coordination,Decreased knowledge of use of  DME,Edema,Flexibility,FMC,Dexterity,Strength,GMC,Pain,Tone,UE functional use,ROM,IADL,Sensation   Psychosocial Skills: Coping Strategies   Visit Diagnosis: Muscle weakness (generalized)  Unsteadiness on feet  Pain in right wrist  Other lack of coordination  Other symptoms and signs involving the nervous system  Other disturbances of skin sensation    Problem List Patient Active Problem List   Diagnosis Date Noted  . Autonomic neuropathy associated with type 1 diabetes mellitus ([place]) 12/03/2019  . DKA, type 1 ([place]) 10/23/2019  . DKA (diabetic ketoacidosis) ([place]) 10/21/2019  . COVID-19 virus infection 10/21/2019  . Diabetic polyneuropathy associated with type 1 diabetes mellitus ([place]) 02/26/2019  . Leg weakness, bilateral 02/25/2019  . Postprandial bloating 02/25/2019  . Sensory problems with limbs 02/25/2019  . Ketonuria 11/04/2018  . Diabetes ([place]) 04/23/2018  . Uncontrolled type 1 diabetes mellitus with hyperglycemia ([place]) 05/24/2017  . Morbid childhood obesity with BMI greater than 99th percentile for age [facility]) 05/24/2017  . Insulin resistance 05/24/2017  . Acanthosis 05/24/2017    Mariah Milling, OTR/L 05/05/2020, 3:06 PM  [facility] [address] [place], [place], [address] Phone: [phone]   Fax:  [phone]  Name: Lucas Cunningham MRN: [id number] Date of Birth: 08/24/2000

## 2020-05-07 NOTE — Therapy (Signed)
[facility] [address] [place], [place], [address] Phone: [phone]   Fax:  [phone]  Physical Therapy Treatment  Patient Details  Name: Lucas Cunningham MRN: [id number] Date of Birth: [birth year] Referring Provider (PT): [name]   Encounter Date: 05/05/2020    05/05/20 1453  PT Visits / Re-Eval  Visit Number 15  Number of Visits 23  Authorization  Authorization Type [employer]  Authorization Time Period 16 visits 12/8-2/02/22 - awaiting auth for more visits (used 9 visits), 12 visits approved from 2/16-5/27  Authorization - Visit Number 4  Authorization - Number of Visits 12  PT Time Calculation  PT Start Time 1448  PT Stop Time 1530  PT Time Calculation (min) 42 min  PT - End of Session  Equipment Utilized During Treatment Gait belt  Activity Tolerance Patient tolerated treatment well  Behavior During Therapy [facility] for tasks assessed/performed     Past Medical History:  Diagnosis Date  . Diabetes mellitus without complication ([place])    type 1    Past Surgical History:  Procedure Laterality Date  . NO PAST SURGERIES      There were no vitals filed for this visit.      05/05/20 1452  Symptoms/Limitations  Subjective No new complaints. Has not worn the AFO's since getting them on Monday, hasn't thought to ask grandmother to help put them on.  Limitations Walking;Standing;House hold activities  How long can you stand comfortably? 10 mins  How long can you walk comfortably? 5 mins  Patient Stated Goals "I want to be able to bring in groceries and help around the house."  Pain Assessment  Currently in Pain? No/denies  Pain Score 0       05/05/20 1455  Transfers  Transfers Sit to Stand;Stand to Sit  Sit to Stand 5: Supervision  Stand to Sit 5: Supervision  Ambulation/Gait  Ambulation/Gait Yes  Ambulation/Gait Assistance 5: Supervision  Ambulation/Gait  Assistance Details cues to decrease toe out with gait right>left side and for posture. No issues noted with use of elastic laces OT applied to shoes.  Ambulation Distance (Feet) 230 Feet (x1, plus around gym with session)  Assistive device Rolling walker;Other (Comment) (bil AFO's, right hand orthotic)  Gait Pattern Step-through pattern;Decreased stride length;Decreased dorsiflexion - right;Decreased dorsiflexion - left;Right steppage;Left steppage;Wide base of support;Trunk flexed  Ambulation Surface Level;Indoor  Self-Care  Self-Care Other Self-Care Comments  Other Self-Care Comments  had pt work on self donning braces with sneakers with elastic laces. cues needed along with increased time.  Exercises  Exercises Other Exercises  Other Exercises  seated at edge of mat: with 2# ankle weights for long arc quads with 3 sec holds, then alternating marching for 10 reps each; with green band resistance for hamstring curls for 10 reps each side. cues for slow and controlled movements with all ex's.  Knee/Hip Exercises: Aerobic  Other Aerobic Scifit LE's level 3.5 x 6 minutes with goal >/= 45 rpm for strengthening and activitity tolerance.        PT Short Term Goals - 05/03/20 1208      PT SHORT TERM GOAL #1   Title Pt will obtain B AFOs in order to to decrease fall risk and improve efficiency of gait. ALL STGS DUE 04/18/20    Baseline received 05/03/20    Time 3    Period Weeks    Status Achieved    Target Date 04/18/20      PT  SHORT TERM GOAL #2   Title Pt will be able to stand statically without support x10  secs at RW with min guard in order to indicate improved balance and strength.    Baseline unable to let go of RW today 05/03/20    Time 3    Period Weeks    Status Not Met      PT SHORT TERM GOAL #3   Title Pt will improve gait speed to >/= 2.16 ft/sec w/ LRAD and B AFO as needed to indicate dec fall risk.    Baseline 2.44 ft/sec with B AFOs and RW    Time 3    Period Weeks     Status Achieved      PT SHORT TERM GOAL #4   Title Pt will improve 5TSS to </=18 secs with BUE support in order to indicate improved strength.    Baseline 26.10 secs with BUE support from mat table 05/03/20    Time 3    Period Weeks    Status Not Met      PT SHORT TERM GOAL #5   Title Pt will decr TUG with B AFOs and RW to 17 seconds or less in order to demo decr fall risk.    Baseline 28.88 secs with B AFOs and RW    Time 3    Period Weeks    Status Not Met             PT Long Term Goals - 03/28/20 1622      PT LONG TERM GOAL #1   Title Pt will be IND with final HEP in order to indicate improved functional mobility and dec fall risk.  ALL LTGS DUE 05/09/20    Baseline independent with current HEP, will continue to benefit from additions    Time 6    Period Weeks    Status New    Target Date 05/09/20      PT LONG TERM GOAL #2   Title Pt will improve gait speed to >/=2.62 ft/sec with LRAD and B AFOs as needed in order to indicate safe community ambulation.    Baseline 17 seconds = 1.92 ft/sec with RW and no AFOs on 03/26/20    Time 6    Period Weeks    Status On-going      PT LONG TERM GOAL #3   Title Pt will improve 5TSS to </=16 secs with single UE support only in order to indicate improved functional strength.    Baseline 21.75 secs with BUE support, 21.34 for 2nd attempt with BUE support from mat table on 03/26/20    Time 6    Period Weeks    Status On-going      PT LONG TERM GOAL #4   Title Pt will ambulate x 500' over unlevel paved surfaces w/ LRAD and B AFOs as needed at mod I level in order to indicate safe community mobility.    Baseline due to time constraints, unable to assess on 03/26/20    Time 6    Period Weeks    Status On-going      PT LONG TERM GOAL #5   Title Pt will perform dynamic standing balance with intermittent UE support x 3 mins in order to perform ADLs safely at home.    Baseline on 03/26/20: static standing needing UE support, able to let go  for approx. 5 seconds at a time, stood for a total on 2 minutes and 30 seconds  Time 6    Period Weeks    Status Revised              05/05/20 1454  Plan  Clinical Impression Statement Today's skilled session initially focused on pt self donning bil braces with shoes that OT placed elastic laces on with increased time/effort needed for pt to complete task without assistance. No issued noted with gait using braces and elastic laces in session today. Remainder of session focused on LE strengthening with no issues reported or noted in session.  Personal Factors and Comorbidities Comorbidity 3+;Time since onset of injury/illness/exacerbation  Comorbidities see above  Examination-Activity Limitations Bathing;Dressing;Locomotion Level;Transfers;Squat;Stairs;Stand  Examination-Participation Restrictions Community Activity;Driving;School;Yard Work (school)  Pt will benefit from skilled therapeutic intervention in order to improve on the following deficits Abnormal gait;Decreased activity tolerance;Decreased balance;Decreased endurance;Decreased knowledge of precautions;Decreased knowledge of use of DME;Decreased mobility;Decreased safety awareness;Decreased strength;Difficulty walking;Impaired perceived functional ability;Impaired sensation;Impaired UE functional use;Postural dysfunction  Stability/Clinical Decision Making Evolving/Moderate complexity  Rehab Potential Good  PT Frequency 2x / week  PT Duration 6 weeks  PT Treatment/Interventions ADLs/Self Care Home Management;DME Instruction;Gait training;Stair training;Functional mobility training;Therapeutic activities;Therapeutic exercise;Balance training;Neuromuscular re-education;Patient/family education;Orthotic Fit/Training;Passive range of motion;Vestibular  PT Next Visit Plan LTGs due 05/09/20.Marland Kitchen  Has diabetic shoe appt at [facility] on 4/7 so can continue to remind him of this.  Work towards [employer] as I only scheduled him through next infusion  date to avoid missing so many appts.  Continue BLE strength, activity tolerance and balance.  Consulted and Agree with Plan of Care Patient  Family Member Consulted grandmother         Patient will benefit from skilled therapeutic intervention in order to improve the following deficits and impairments:  Abnormal gait,Decreased activity tolerance,Decreased balance,Decreased endurance,Decreased knowledge of precautions,Decreased knowledge of use of DME,Decreased mobility,Decreased safety awareness,Decreased strength,Difficulty walking,Impaired perceived functional ability,Impaired sensation,Impaired UE functional use,Postural dysfunction  Visit Diagnosis: Muscle weakness (generalized)  Other abnormalities of gait and mobility  Unsteadiness on feet  Foot drop, left  Foot drop, right     Problem List Patient Active Problem List   Diagnosis Date Noted  . Autonomic neuropathy associated with type 1 diabetes mellitus ([place]) 12/03/2019  . DKA, type 1 ([place]) 10/23/2019  . DKA (diabetic ketoacidosis) ([place]) 10/21/2019  . COVID-19 virus infection 10/21/2019  . Diabetic polyneuropathy associated with type 1 diabetes mellitus ([place]) 02/26/2019  . Leg weakness, bilateral 02/25/2019  . Postprandial bloating 02/25/2019  . Sensory problems with limbs 02/25/2019  . Ketonuria 11/04/2018  . Diabetes ([place]) 04/23/2018  . Uncontrolled type 1 diabetes mellitus with hyperglycemia ([place]) 05/24/2017  . Morbid childhood obesity with BMI greater than 99th percentile for age [facility]) 05/24/2017  . Insulin resistance 05/24/2017  . Acanthosis 05/24/2017   Willow Ora, PTA, [facility] [address] [place], [place] [address] [phone] 05/07/20, 10:21 AM   Name: Deegan Valentino MRN: [id number] Date of Birth: 2001/01/25

## 2020-05-10 ENCOUNTER — Other Ambulatory Visit: Payer: Self-pay

## 2020-05-10 ENCOUNTER — Ambulatory Visit: Payer: Medicaid Other | Admitting: Occupational Therapy

## 2020-05-10 ENCOUNTER — Encounter: Payer: Self-pay | Admitting: Occupational Therapy

## 2020-05-10 ENCOUNTER — Ambulatory Visit: Payer: Medicaid Other | Admitting: Rehabilitation

## 2020-05-10 ENCOUNTER — Encounter: Payer: Self-pay | Admitting: Rehabilitation

## 2020-05-10 DIAGNOSIS — M6281 Muscle weakness (generalized): Secondary | ICD-10-CM

## 2020-05-10 DIAGNOSIS — R2689 Other abnormalities of gait and mobility: Secondary | ICD-10-CM

## 2020-05-10 DIAGNOSIS — R208 Other disturbances of skin sensation: Secondary | ICD-10-CM

## 2020-05-10 DIAGNOSIS — R2681 Unsteadiness on feet: Secondary | ICD-10-CM

## 2020-05-10 DIAGNOSIS — M25531 Pain in right wrist: Secondary | ICD-10-CM

## 2020-05-10 DIAGNOSIS — R278 Other lack of coordination: Secondary | ICD-10-CM

## 2020-05-10 DIAGNOSIS — M21372 Foot drop, left foot: Secondary | ICD-10-CM

## 2020-05-10 DIAGNOSIS — R29818 Other symptoms and signs involving the nervous system: Secondary | ICD-10-CM

## 2020-05-10 DIAGNOSIS — M21371 Foot drop, right foot: Secondary | ICD-10-CM

## 2020-05-10 NOTE — Patient Instructions (Signed)
Access Code: [email or web address] URL: [email or web address] Date: 05/10/2020 Prepared by: Harriet Butte  Exercises Seated Ankle Plantarflexion with Resistance - 1 x daily - 5 x weekly - 1 sets - 10 reps Seated Knee Extension with Anchored Resistance - 1 x daily - 5 x weekly - 1 sets - 10 reps Seated Knee Flexion with Anchored Resistance - 1 x daily - 5 x weekly - 1 sets - 10 reps Standing March with Counter Support - 1 x daily - 5 x weekly - 1 sets - 10 reps Standing Hip Abduction with Counter Support - 1 x daily - 5 x weekly - 1 sets - 10 reps Wide Stance with Counter Support - 1 x daily - 5 x weekly - 1 sets - 10 reps Lateral Weight Shift with Arm Raise - 1 x daily - 7 x weekly - 1 sets - 10 reps

## 2020-05-10 NOTE — Therapy (Signed)
[facility] [address] [place], [place], [address] Phone: [phone]   Fax:  [phone]  Occupational Therapy Treatment & Recertification  Patient Details  Name: Lucas Cunningham MRN: [id number] Date of Birth: 2000-08-06 Referring Provider (OT): Percell Miller, NP   Encounter Date: 05/10/2020   OT End of Session - 05/10/20 1104    Visit Number 14    Number of Visits 22   +8 at renewal - may be less d/t scheduling   Date for OT Re-Evaluation 06/07/20    Authorization Type [employer]    Authorization Time Period Renewal completed 05/10/2020 for 4 weeks    Authorization - Visit Number --    Authorization - Number of Visits --    OT Start Time 1102    OT Stop Time 1145    OT Time Calculation (min) 43 min    Activity Tolerance Patient tolerated treatment well    Behavior During Therapy [facility] for tasks assessed/performed           Past Medical History:  Diagnosis Date  . Diabetes mellitus without complication ([place])    type 1    Past Surgical History:  Procedure Laterality Date  . NO PAST SURGERIES      There were no vitals filed for this visit.   Subjective Assessment - 05/10/20 1105    Subjective  Pt denies any pain. Pt reports feels like someone is hitting his toes.    Pertinent History PMH Type I diabetes, depression, peripheral neuropathy    Limitations Fall Risk. Sensation deficits in RUE    Patient Stated Goals Pt wants "to be able to grab things and write and use my fingers"    Currently in Pain? No/denies            Splint: pt with difficulty with doffing RUE dorsal wrist cock up splint - unable to get adequate grip on straps for pulling off for doffing. Pt able to don with increased time. Pt would benefit from LUE dorsal wrist cock up splint.   spoon with beans with BUE (right with use of dorsal wrist cock up splint). Pt also practiced with use of universal cuff on BUE. Pt with increased  coordination and accuracy with use of universal cuff with using spoon and narrower barrel. Pt practiced handwriting and writing name with RUE.  Donning and doffing bilateral AFOs with min A for pulling strap tight on BLE    Pt with benefit from dorsal wrist cock up splint on RUE and with use of universal cuff bilaterally today.                    OT Short Term Goals - 05/10/20 1108      OT SHORT TERM GOAL #1   Title Pt will be independent with initial HEP targeting grip strength and range of motion    Baseline not issued yet    Time 4    Period Weeks    Status Achieved    Target Date 02/12/20      OT SHORT TERM GOAL #2   Title Pt will verbalize understanding of sensory strategies for RUE and safety    Baseline have not reviewed    Time 4    Period Weeks    Status Achieved   received education on sensory strategies and safety for poor sensation in BUE     OT SHORT TERM GOAL #3   Title Pt will increase grip strength in  LUE by 5 lbs and RUE by 2 lbs for increase in functional use of BUE for ADLs and IADLs    Baseline RUE 5 LUE 29.5    Time 4    Period Weeks    Status Not Met   04/07/20 LUE 14.5 lbs RUE 2.4 lbs  05/10/20 LUE 13.4bs RUE 1.9 lbs     OT SHORT TERM GOAL #4   Title Pt will perform donning and doffing socks and shoes with supervision for increase in independence with ADLs    Baseline unable to don socks consistently    Time 4    Period Weeks    Status Partially Met   pt is not able to put socks on without physical assistance but able to put shoes on. d/t limited grip pt has increased difficulty with donning socks and new AFOs. Pt has been issued elastic shoelaces     OT SHORT TERM GOAL #5   Title Pt will perform simple cold meal prep (sandwich) and/or light housekeeping with supervision    Baseline not completing at this time    Time 4    Period Weeks    Status Achieved   04/07/20 - pt reports making cereal but not sandwich and participating in some  home management            OT Long Term Goals - 05/10/20 1114      OT [place] #1   Title Pt will be independent with updated HEP 03/11/2020    Baseline not issued yet    Time 8    Period Weeks    Status On-going      OT LONG TERM GOAL #2   Title Pt will perform simple warm meal prep and/or light house keeping task with mod I and good safety awareness    Baseline not completing at eval    Time 8    Period Weeks    Status Deferred   cooked an egg with min to mod A but demonstrating much difficulty with BUE use for tasks     OT LONG TERM GOAL #3   Title Pt will improve grip strengthening in LUE by 10 lbs and RUE by 6 lbs for increased ability to perform clothing management for ADLs    Baseline LUE 29.5 RUE 5    Time 8    Period Weeks    Status Deferred   Pt has had decline in grip strength since eval d/t neuropathy and CIPD     OT LONG TERM GOAL #4   Title Pt will increased box and blocks score in RUE to 12 blocks or more for increase in functional use of RUE    Baseline RUE 6    Time 8    Period Weeks    Status On-going   04/07/20 - 7 blocks     OT LONG TERM GOAL #5   Title Pt will report decrease pain in RUE wrist with functional use to no more than 6/10    Baseline 9/10    Time 8    Period Weeks    Status Achieved   3/10     Long Term Additional Goals   Additional Long Term Goals Yes      OT LONG TERM GOAL #6   Title Pt will verbalize understanding of adapted equipment and strategies PRN for basic ADLs and IADLs.    Baseline difficulty with donning socks, cutting up food and fasteners    Time 8  Period Weeks    Status On-going   continue to address - pt received elastic shoelaces and foam grip.     OT LONG TERM GOAL #7   Title Pt will be compliant and verbalize understanding of wear and care instructions for any splints and/or orthoses PRN    Baseline RUE dorsal wrist cock up splint made - continue to address and work on one for the left.    Time 4     Period Weeks    Status New                 Plan - 05/10/20 1116    Clinical Impression Statement Patient seems to have further decline in strength and active movement in LUE despite recent IVIG. Continue to address splints/orthoses for maximizing function and independence with ADLs and IADLs for renewal period. Pt demonstrates improved functional use of RUE and LUE with use of splints for maintaining wrist extension. Pt with diffiuclty with donning/doffing and continuing to work on progessing in this area. Will continue to address splinting, wear and care, adapted strategies and equipment for increasing independence.    OT Occupational Profile and History Problem Focused Assessment - Including review of records relating to presenting problem    Occupational performance deficits (Please refer to evaluation for details): IADL's;ADL's;Leisure;Social Participation    Body Structure / Function / Physical Skills ADL;Balance;Coordination;Decreased knowledge of use of DME;Edema;Flexibility;FMC;Dexterity;Strength;GMC;Pain;Tone;UE functional use;ROM;IADL;Sensation    Psychosocial Skills Coping Strategies    Rehab Potential Good    Clinical Decision Making Limited treatment options, no task modification necessary    Comorbidities Affecting Occupational Performance: None    Modification or Assistance to Complete Evaluation  No modification of tasks or assist necessary to complete eval    OT Frequency 2x / week    OT Duration 4 weeks   2x/week for 4 weeks after renewal could be different d/t scheduling conflicts.   OT Treatment/Interventions Self-care/ADL training;Moist Heat;DME and/or AE instruction;Splinting;Balance training;Therapeutic activities;Aquatic Therapy;Therapeutic exercise;Ultrasound;Electrical Stimulation;Cryotherapy;Neuromuscular education;Paraffin;Energy conservation;Manual Therapy;Patient/family education;Passive range of motion;Functional Mobility Training    Plan May benefit from L  dorsal wrist cock up as well.  Address index PIP flexion left, any adapted strategies/equipment for increasing independence and function. pt with benefit from u-cuff and could benefit from more practice with it. possible information about getting one?    Recommended Other Services Pt is receiving PT    Consulted and Agree with Plan of Care Patient;Family member/caregiver    Family Member Consulted grandmother           Patient will benefit from skilled therapeutic intervention in order to improve the following deficits and impairments:   Body Structure / Function / Physical Skills: ADL,Balance,Coordination,Decreased knowledge of use of DME,Edema,Flexibility,FMC,Dexterity,Strength,GMC,Pain,Tone,UE functional use,ROM,IADL,Sensation   Psychosocial Skills: Coping Strategies   Visit Diagnosis: Muscle weakness (generalized) - Plan: Ot plan of care cert/re-cert  Pain in right wrist - Plan: Ot plan of care cert/re-cert  Other lack of coordination - Plan: Ot plan of care cert/re-cert  Other symptoms and signs involving the nervous system - Plan: Ot plan of care cert/re-cert  Other disturbances of skin sensation - Plan: Ot plan of care cert/re-cert  Unsteadiness on feet - Plan: Ot plan of care cert/re-cert    Problem List Patient Active Problem List   Diagnosis Date Noted  . Autonomic neuropathy associated with type 1 diabetes mellitus ([place]) 12/03/2019  . DKA, type 1 ([place]) 10/23/2019  . DKA (diabetic ketoacidosis) ([place]) 10/21/2019  . COVID-19 virus infection 10/21/2019  .  Diabetic polyneuropathy associated with type 1 diabetes mellitus ([place]) 02/26/2019  . Leg weakness, bilateral 02/25/2019  . Postprandial bloating 02/25/2019  . Sensory problems with limbs 02/25/2019  . Ketonuria 11/04/2018  . Diabetes ([place]) 04/23/2018  . Uncontrolled type 1 diabetes mellitus with hyperglycemia ([place]) 05/24/2017  . Morbid childhood obesity with BMI greater than 99th percentile for age [facility]) 05/24/2017   . Insulin resistance 05/24/2017  . Acanthosis 05/24/2017    Zachery Conch MOT, OTR/L  05/10/2020, 12:09 PM  [facility] [address] [place], [place], [address] Phone: [phone]   Fax:  [phone]  Name: Lucas Cunningham MRN: [id number] Date of Birth: [birth year]

## 2020-05-10 NOTE — Therapy (Signed)
[facility] [address] [place], [place], [address] Phone: [phone]   Fax:  [phone]  Physical Therapy Treatment  Patient Details  Name: Lucas Cunningham MRN: [id number] Date of Birth: 30-Apr-2000 Referring Provider (PT): [name]   Encounter Date: 05/10/2020   PT End of Session - 05/10/20 1241    Visit Number 16    Number of Visits 23    Authorization Type [employer]    Authorization Time Period 16 visits 12/8-2/02/22 - awaiting auth for more visits (used 9 visits), 12 visits approved from 2/16-5/27    Authorization - Visit Number 5    Authorization - Number of Visits 12    PT Start Time 1146    PT Stop Time 1233    PT Time Calculation (min) 47 min    Equipment Utilized During Treatment Gait belt    Activity Tolerance Patient tolerated treatment well    Behavior During Therapy WFL for tasks assessed/performed           Past Medical History:  Diagnosis Date  . Diabetes mellitus without complication ([place])    type 1    Past Surgical History:  Procedure Laterality Date  . NO PAST SURGERIES      There were no vitals filed for this visit.   Subjective Assessment - 05/10/20 1149    Subjective Is wearing AFOs a little more, still not consistently everyday.    Limitations Walking;Standing;House hold activities    Patient Stated Goals "I want to be able to bring in groceries and help around the house."    Currently in Pain? No/denies                             [facility] Adult PT Treatment/Exercise - 05/10/20 1237      Transfers   Transfers Sit to Stand;Stand to Sit    Sit to Stand 5: Supervision    Sit to Stand Details (indicate cue type and reason) Min cues for safety and waiting until standing to place hand on hand orthosis    Stand to Sit 5: Supervision    Stand to Sit Details Cues for controlled descent      Ambulation/Gait   Ambulation/Gait Yes     Ambulation/Gait Assistance 5: Supervision    Ambulation/Gait Assistance Details Throughout session small distances with RW, B AFOs and hand orthosis at S level with min cues for posture and forward gaze with less hip/knee flexion.    Ambulation Distance (Feet) 100 Feet    Assistive device Rolling walker;Other (Comment)    Gait Pattern Step-through pattern;Decreased stride length;Decreased dorsiflexion - right;Decreased dorsiflexion - left;Right steppage;Left steppage;Wide base of support;Trunk flexed    Ambulation Surface Level;Indoor      High Level Balance   High Level Balance Comments Reviewed standing HEP, see pt instructions for details on exercises performed.  Added lateral weight shifts with upward/lateral reaching on cabinet without opposite UE support for more dynamic balance challenge.      Exercises   Other Exercises  Performed seated HEP for BLE strength with red theraband.  See pt instructions for details on exercises performed.  Had to have pt used tied theraband to complete resisted PF but he was able to do task.  Needs cues throughout for slower controlled movement.            Seated Scifit with BLEs only at level 2.5 resistance x 5 mins maintaining  rpms in 60's throughout for improved cardiovascular endurance.  Pt tolerated well.       PT Education - 05/10/20 1240    Education Details updated HEP, reminded about appt at [facility] for diabetic shoe fitting.    Person(s) Educated Patient    Methods Explanation;Demonstration;Handout    Comprehension Returned demonstration;Verbalized understanding            PT Short Term Goals - 05/03/20 1208      PT SHORT TERM GOAL #1   Title Pt will obtain B AFOs in order to to decrease fall risk and improve efficiency of gait. ALL STGS DUE 04/18/20    Baseline received 05/03/20    Time 3    Period Weeks    Status Achieved    Target Date 04/18/20      PT SHORT TERM GOAL #2   Title Pt will be able to stand statically without  support x10  secs at RW with min guard in order to indicate improved balance and strength.    Baseline unable to let go of RW today 05/03/20    Time 3    Period Weeks    Status Not Met      PT SHORT TERM GOAL #3   Title Pt will improve gait speed to >/= 2.16 ft/sec w/ LRAD and B AFO as needed to indicate dec fall risk.    Baseline 2.44 ft/sec with B AFOs and RW    Time 3    Period Weeks    Status Achieved      PT SHORT TERM GOAL #4   Title Pt will improve 5TSS to </=18 secs with BUE support in order to indicate improved strength.    Baseline 26.10 secs with BUE support from mat table 05/03/20    Time 3    Period Weeks    Status Not Met      PT SHORT TERM GOAL #5   Title Pt will decr TUG with B AFOs and RW to 17 seconds or less in order to demo decr fall risk.    Baseline 28.88 secs with B AFOs and RW    Time 3    Period Weeks    Status Not Met             PT Long Term Goals - 05/10/20 1252      PT LONG TERM GOAL #1   Title Pt will be IND with final HEP in order to indicate improved functional mobility and dec fall risk.  ALL LTGS DUE 05/28/20 (updated due to missing 3 weeks)    Baseline independent with current HEP, will continue to benefit from additions    Time 6    Period Weeks    Status New      PT LONG TERM GOAL #2   Title Pt will improve gait speed to >/=2.62 ft/sec with LRAD and B AFOs as needed in order to indicate safe community ambulation.    Baseline 17 seconds = 1.92 ft/sec with RW and no AFOs on 03/26/20    Time 6    Period Weeks    Status On-going      PT LONG TERM GOAL #3   Title Pt will improve 5TSS to </=16 secs with single UE support only in order to indicate improved functional strength.    Baseline 21.75 secs with BUE support, 21.34 for 2nd attempt with BUE support from mat table on 03/26/20    Time 6    Period  Weeks    Status On-going      PT LONG TERM GOAL #4   Title Pt will ambulate x 500' over unlevel paved surfaces w/ LRAD and B AFOs as  needed at mod I level in order to indicate safe community mobility.    Baseline due to time constraints, unable to assess on 03/26/20    Time 6    Period Weeks    Status On-going      PT LONG TERM GOAL #5   Title Pt will perform dynamic standing balance with intermittent UE support x 3 mins in order to perform ADLs safely at home.    Baseline on 03/26/20: static standing needing UE support, able to let go for approx. 5 seconds at a time, stood for a total on 2 minutes and 30 seconds    Time 6    Period Weeks    Status Revised                 Plan - 05/10/20 1245    Clinical Impression Statement Skilled session focused on going over HEP as it has been months since issued.  PT updated slightly and condensed somewhat to allow increased compliance.  Continue to educated on appt at [facility] for diabetic shoe fitting and also importance of building up tolerance to AFOs, checking skin when removing to ensure no skin breakdown.  Pt verbalized understanding.    Personal Factors and Comorbidities Comorbidity 3+;Time since onset of injury/illness/exacerbation    Comorbidities see above    Examination-Activity Limitations Bathing;Dressing;Locomotion Level;Transfers;Squat;Stairs;Stand    Examination-Participation Restrictions Community Activity;Driving;School;Yard Work   Occupational hygienist Evolving/Moderate complexity    Rehab Potential Good    PT Frequency 2x / week    PT Duration 6 weeks    PT Treatment/Interventions ADLs/Self Care Home Management;DME Instruction;Gait training;Stair training;Functional mobility training;Therapeutic activities;Therapeutic exercise;Balance training;Neuromuscular re-education;Patient/family education;Orthotic Fit/Training;Passive range of motion;Vestibular    PT Next Visit Plan LTGs due 05/09/20 (I updated the date)  Has diabetic shoe appt at [facility] on 4/7 so can continue to remind him of this.  Work towards [employer] as I only scheduled him through  next infusion date to avoid missing so many appts.  Continue BLE strength, activity tolerance and balance.    Consulted and Agree with Plan of Care Patient    Family Member Consulted grandmother           Patient will benefit from skilled therapeutic intervention in order to improve the following deficits and impairments:  Abnormal gait,Decreased activity tolerance,Decreased balance,Decreased endurance,Decreased knowledge of precautions,Decreased knowledge of use of DME,Decreased mobility,Decreased safety awareness,Decreased strength,Difficulty walking,Impaired perceived functional ability,Impaired sensation,Impaired UE functional use,Postural dysfunction  Visit Diagnosis: Muscle weakness (generalized)  Unsteadiness on feet  Other abnormalities of gait and mobility  Foot drop, left  Foot drop, right     Problem List Patient Active Problem List   Diagnosis Date Noted  . Autonomic neuropathy associated with type 1 diabetes mellitus ([place]) 12/03/2019  . DKA, type 1 ([place]) 10/23/2019  . DKA (diabetic ketoacidosis) ([place]) 10/21/2019  . COVID-19 virus infection 10/21/2019  . Diabetic polyneuropathy associated with type 1 diabetes mellitus ([place]) 02/26/2019  . Leg weakness, bilateral 02/25/2019  . Postprandial bloating 02/25/2019  . Sensory problems with limbs 02/25/2019  . Ketonuria 11/04/2018  . Diabetes ([place]) 04/23/2018  . Uncontrolled type 1 diabetes mellitus with hyperglycemia ([place]) 05/24/2017  . Morbid childhood obesity with BMI greater than 99th percentile for age [facility]) 05/24/2017  . Insulin  resistance 05/24/2017  . Acanthosis 05/24/2017    Cameron Sprang, PT, MPT [facility] [address] [place], [place], [address] Phone: [phone]   Fax:  [phone] 05/10/20, 12:54 PM  Name: Lucas Cunningham MRN: [id number] Date of Birth: Sep 18, 2000

## 2020-05-11 ENCOUNTER — Ambulatory Visit (INDEPENDENT_AMBULATORY_CARE_PROVIDER_SITE_OTHER): Payer: Medicaid Other | Admitting: Pediatrics

## 2020-05-12 ENCOUNTER — Ambulatory Visit: Payer: Medicaid Other | Admitting: Physical Therapy

## 2020-05-13 ENCOUNTER — Other Ambulatory Visit: Payer: Self-pay

## 2020-05-13 ENCOUNTER — Encounter: Payer: Self-pay | Admitting: Physical Therapy

## 2020-05-13 ENCOUNTER — Ambulatory Visit: Payer: Medicaid Other | Admitting: Physical Therapy

## 2020-05-13 DIAGNOSIS — M21372 Foot drop, left foot: Secondary | ICD-10-CM

## 2020-05-13 DIAGNOSIS — M6281 Muscle weakness (generalized): Secondary | ICD-10-CM

## 2020-05-13 DIAGNOSIS — M21371 Foot drop, right foot: Secondary | ICD-10-CM

## 2020-05-13 DIAGNOSIS — R2681 Unsteadiness on feet: Secondary | ICD-10-CM

## 2020-05-13 DIAGNOSIS — R2689 Other abnormalities of gait and mobility: Secondary | ICD-10-CM

## 2020-05-13 NOTE — Therapy (Signed)
[facility] [address] [place], [place], [address] Phone: [phone]   Fax:  [phone]  Physical Therapy Treatment  Patient Details  Name: Lucas Cunningham MRN: [id number] Date of Birth: Jul 21, 2000 Referring Provider (PT): Lucas Cagey Torric NP   Encounter Date: 05/13/2020   PT End of Session - 05/13/20 1112    Visit Number 17    Number of Visits 23    Authorization Type [employer]    Authorization Time Period 16 visits 12/8-2/02/22 - awaiting auth for more visits (used 9 visits), 12 visits approved from 2/16-5/27    Authorization - Visit Number 6    Authorization - Number of Visits 12    PT Start Time 1110   pt running late for session   PT Stop Time 1145    PT Time Calculation (min) 35 min    Equipment Utilized During Treatment Gait belt    Activity Tolerance Patient tolerated treatment well;No increased pain    Behavior During Therapy WFL for tasks assessed/performed           Past Medical History:  Diagnosis Date  . Diabetes mellitus without complication ([place])    type 1    Past Surgical History:  Procedure Laterality Date  . NO PAST SURGERIES      There were no vitals filed for this visit.   Subjective Assessment - 05/13/20 1110    Subjective Reports walking to the [facility] store and back home yesterday. Also walked around the store. Was on his feet for over 3 hours with no issues. Had his braces on. Has been wearing them more. No issues with new ex's. from last session.    Limitations Walking;Standing;House hold activities    How long can you stand comfortably? 10 mins    How long can you walk comfortably? 5 mins    Patient Stated Goals "I want to be able to bring in groceries and help around the house."    Currently in Pain? No/denies    Pain Score 0-No pain                 [facility] Adult PT Treatment/Exercise - 05/13/20 1113      Transfers   Transfers Sit to  Stand;Stand to Sit    Sit to Stand 5: Supervision    Stand to Sit 5: Supervision      Ambulation/Gait   Ambulation/Gait Yes    Ambulation/Gait Assistance 5: Supervision    Ambulation/Gait Assistance Details around gym with session    Assistive device Rolling walker;Other (Comment)    Gait Pattern Step-through pattern;Decreased stride length;Decreased dorsiflexion - right;Decreased dorsiflexion - left;Right steppage;Left steppage;Wide base of support;Trunk flexed    Ambulation Surface Level;Indoor      High Level Balance   High Level Balance Activities Side stepping;Marching forwards;Backward walking    High Level Balance Comments in parallel bars: 3 laps each/each way with light UE support, min guard assist with cues on form, weight shifting.      Neuro Re-ed    Neuro Re-ed Details  for balance/NMR/strengthening: in standing working on static balance with decreased UE support. Pt started with bil UE support on sturdy surface working on alternating UE raises for ~5 reps each side. then worked en bloc on removing bil UEs from support with progressively longer holds before needing UE support again. Cues on posture, weight shifting to assist with balance. Max time 16 sec's x2 reps. Min guard to min assist for balance; then  back to bil light support:      Knee/Hip Exercises: Aerobic   Other Aerobic Scifit LE's level 3.5 x 8 minutes with goal >/= 45 rpm for strengthening and activitity tolerance.               PT Short Term Goals - 05/03/20 1208      PT SHORT TERM GOAL #1   Title Pt will obtain B AFOs in order to to decrease fall risk and improve efficiency of gait. ALL STGS DUE 04/18/20    Baseline received 05/03/20    Time 3    Period Weeks    Status Achieved    Target Date 04/18/20      PT SHORT TERM GOAL #2   Title Pt will be able to stand statically without support x10  secs at RW with min guard in order to indicate improved balance and strength.    Baseline unable to let go of  RW today 05/03/20    Time 3    Period Weeks    Status Not Met      PT SHORT TERM GOAL #3   Title Pt will improve gait speed to >/= 2.16 ft/sec w/ LRAD and B AFO as needed to indicate dec fall risk.    Baseline 2.44 ft/sec with B AFOs and RW    Time 3    Period Weeks    Status Achieved      PT SHORT TERM GOAL #4   Title Pt will improve 5TSS to </=18 secs with BUE support in order to indicate improved strength.    Baseline 26.10 secs with BUE support from mat table 05/03/20    Time 3    Period Weeks    Status Not Met      PT SHORT TERM GOAL #5   Title Pt will decr TUG with B AFOs and RW to 17 seconds or less in order to demo decr fall risk.    Baseline 28.88 secs with B AFOs and RW    Time 3    Period Weeks    Status Not Met             PT Long Term Goals - 05/10/20 1252      PT LONG TERM GOAL #1   Title Pt will be IND with final HEP in order to indicate improved functional mobility and dec fall risk.  ALL LTGS DUE 05/28/20 (updated due to missing 3 weeks)    Baseline independent with current HEP, will continue to benefit from additions    Time 6    Period Weeks    Status New      PT LONG TERM GOAL #2   Title Pt will improve gait speed to >/=2.62 ft/sec with LRAD and B AFOs as needed in order to indicate safe community ambulation.    Baseline 17 seconds = 1.92 ft/sec with RW and no AFOs on 03/26/20    Time 6    Period Weeks    Status On-going      PT LONG TERM GOAL #3   Title Pt will improve 5TSS to </=16 secs with single UE support only in order to indicate improved functional strength.    Baseline 21.75 secs with BUE support, 21.34 for 2nd attempt with BUE support from mat table on 03/26/20    Time 6    Period Weeks    Status On-going      PT LONG TERM GOAL #4   Title Pt  will ambulate x 500' over unlevel paved surfaces w/ LRAD and B AFOs as needed at mod I level in order to indicate safe community mobility.    Baseline due to time constraints, unable to assess on  03/26/20    Time 6    Period Weeks    Status On-going      PT LONG TERM GOAL #5   Title Pt will perform dynamic standing balance with intermittent UE support x 3 mins in order to perform ADLs safely at home.    Baseline on 03/26/20: static standing needing UE support, able to let go for approx. 5 seconds at a time, stood for a total on 2 minutes and 30 seconds    Time 6    Period Weeks    Status Revised                 Plan - 05/13/20 1112    Clinical Impression Statement Today's skilled session continued to focus on strengthening and balance training with no issues reported or noted in session. Did also briefly discuss safety with walking to the store. Pt then stated he had his sister with him, was not alone. The pt is progressing toward goals and should benefit from continued PT to progress toward unmet goals.    Personal Factors and Comorbidities Comorbidity 3+;Time since onset of injury/illness/exacerbation    Comorbidities see above    Examination-Activity Limitations Bathing;Dressing;Locomotion Level;Transfers;Squat;Stairs;Stand    Examination-Participation Restrictions Community Activity;Driving;School;Yard Work   Occupational hygienist Evolving/Moderate complexity    Rehab Potential Good    PT Frequency 2x / week    PT Duration 6 weeks    PT Treatment/Interventions ADLs/Self Care Home Management;DME Instruction;Gait training;Stair training;Functional mobility training;Therapeutic activities;Therapeutic exercise;Balance training;Neuromuscular re-education;Patient/family education;Orthotic Fit/Training;Passive range of motion;Vestibular    PT Next Visit Plan LTGs due 05/09/20 (I updated the date)  Has diabetic shoe appt at [facility] on 4/7 so can continue to remind him of this.  Work towards [employer] as I only scheduled him through next infusion date to avoid missing so many appts.  Continue BLE strength, activity tolerance and balance.    Consulted and Agree with  Plan of Care Patient    Family Member Consulted grandmother           Patient will benefit from skilled therapeutic intervention in order to improve the following deficits and impairments:  Abnormal gait,Decreased activity tolerance,Decreased balance,Decreased endurance,Decreased knowledge of precautions,Decreased knowledge of use of DME,Decreased mobility,Decreased safety awareness,Decreased strength,Difficulty walking,Impaired perceived functional ability,Impaired sensation,Impaired UE functional use,Postural dysfunction  Visit Diagnosis: Muscle weakness (generalized)  Unsteadiness on feet  Other abnormalities of gait and mobility  Foot drop, left  Foot drop, right     Problem List Patient Active Problem List   Diagnosis Date Noted  . Autonomic neuropathy associated with type 1 diabetes mellitus ([place]) 12/03/2019  . DKA, type 1 ([place]) 10/23/2019  . DKA (diabetic ketoacidosis) ([place]) 10/21/2019  . COVID-19 virus infection 10/21/2019  . Diabetic polyneuropathy associated with type 1 diabetes mellitus ([place]) 02/26/2019  . Leg weakness, bilateral 02/25/2019  . Postprandial bloating 02/25/2019  . Sensory problems with limbs 02/25/2019  . Ketonuria 11/04/2018  . Diabetes ([place]) 04/23/2018  . Uncontrolled type 1 diabetes mellitus with hyperglycemia ([place]) 05/24/2017  . Morbid childhood obesity with BMI greater than 99th percentile for age [facility]) 05/24/2017  . Insulin resistance 05/24/2017  . Acanthosis 05/24/2017    Willow Ora, PTA, [facility] [address], [address]  [place], [place] [address] [phone] 05/13/20, 7:41 PM   Name: Tremain Rucinski MRN: [id number] Date of Birth: 2000/03/02

## 2020-05-17 ENCOUNTER — Encounter: Payer: Self-pay | Admitting: Occupational Therapy

## 2020-05-17 ENCOUNTER — Ambulatory Visit: Payer: Medicaid Other | Attending: Pediatrics | Admitting: Occupational Therapy

## 2020-05-17 ENCOUNTER — Other Ambulatory Visit: Payer: Self-pay

## 2020-05-17 ENCOUNTER — Encounter: Payer: Self-pay | Admitting: Rehabilitation

## 2020-05-17 ENCOUNTER — Ambulatory Visit: Payer: Medicaid Other | Admitting: Rehabilitation

## 2020-05-17 DIAGNOSIS — R208 Other disturbances of skin sensation: Secondary | ICD-10-CM | POA: Diagnosis present

## 2020-05-17 DIAGNOSIS — R29818 Other symptoms and signs involving the nervous system: Secondary | ICD-10-CM | POA: Diagnosis present

## 2020-05-17 DIAGNOSIS — R2681 Unsteadiness on feet: Secondary | ICD-10-CM | POA: Diagnosis present

## 2020-05-17 DIAGNOSIS — M21371 Foot drop, right foot: Secondary | ICD-10-CM | POA: Insufficient documentation

## 2020-05-17 DIAGNOSIS — M6281 Muscle weakness (generalized): Secondary | ICD-10-CM | POA: Diagnosis present

## 2020-05-17 DIAGNOSIS — M21372 Foot drop, left foot: Secondary | ICD-10-CM | POA: Insufficient documentation

## 2020-05-17 DIAGNOSIS — R2689 Other abnormalities of gait and mobility: Secondary | ICD-10-CM | POA: Insufficient documentation

## 2020-05-17 DIAGNOSIS — R278 Other lack of coordination: Secondary | ICD-10-CM | POA: Insufficient documentation

## 2020-05-17 DIAGNOSIS — M25531 Pain in right wrist: Secondary | ICD-10-CM | POA: Insufficient documentation

## 2020-05-17 NOTE — Therapy (Signed)
[facility] [address] [place], [place], [address] Phone: [phone]   Fax:  [phone]  Occupational Therapy Treatment  Patient Details  Name: Lucas Cunningham MRN: [id number] Date of Birth: 15-Oct-2000 Referring Provider (OT): Percell Miller, NP   Encounter Date: 05/17/2020   OT End of Session - 05/17/20 1112    Visit Number 15    Number of Visits 22   +8 at renewal - may be less d/t scheduling   Date for OT Re-Evaluation 06/07/20    Authorization Type [employer]    Authorization Time Period Renewal completed 05/10/2020 for 4 weeks    Authorization - Visit Number 5    Authorization - Number of Visits 6    OT Start Time 1109   pt arrived late   OT Stop Time 1145    OT Time Calculation (min) 36 min    Activity Tolerance Patient tolerated treatment well    Behavior During Therapy [facility] for tasks assessed/performed           Past Medical History:  Diagnosis Date  . Diabetes mellitus without complication ([place])    type 1    Past Surgical History:  Procedure Laterality Date  . NO PAST SURGERIES      There were no vitals filed for this visit.   Subjective Assessment - 05/17/20 1111    Subjective  My other hand is messed up now.    Pertinent History PMH Type I diabetes, depression, peripheral neuropathy    Limitations Fall Risk. Sensation deficits in RUE    Patient Stated Goals Pt wants "to be able to grab things and write and use my fingers"    Currently in Pain? No/denies             Began fabrication of L dorsal wrist splint for support for functional tasks; however, unable to complete due to time constraints.--continue fabrication next session.  Picking up dominos and placing in container with mod difficulty, improved with repetition.  Cued to attempt with 2 point pinch.         OT Short Term Goals - 05/10/20 1108      OT SHORT TERM GOAL #1   Title Pt will be independent with initial HEP  targeting grip strength and range of motion    Baseline not issued yet    Time 4    Period Weeks    Status Achieved    Target Date 02/12/20      OT SHORT TERM GOAL #2   Title Pt will verbalize understanding of sensory strategies for RUE and safety    Baseline have not reviewed    Time 4    Period Weeks    Status Achieved   received education on sensory strategies and safety for poor sensation in BUE     OT SHORT TERM GOAL #3   Title Pt will increase grip strength in LUE by 5 lbs and RUE by 2 lbs for increase in functional use of BUE for ADLs and IADLs    Baseline RUE 5 LUE 29.5    Time 4    Period Weeks    Status Not Met   04/07/20 LUE 14.5 lbs RUE 2.4 lbs  05/10/20 LUE 13.4bs RUE 1.9 lbs     OT SHORT TERM GOAL #4   Title Pt will perform donning and doffing socks and shoes with supervision for increase in independence with ADLs    Baseline unable to don socks consistently  Time 4    Period Weeks    Status Partially Met   pt is not able to put socks on without physical assistance but able to put shoes on. d/t limited grip pt has increased difficulty with donning socks and new AFOs. Pt has been issued elastic shoelaces     OT SHORT TERM GOAL #5   Title Pt will perform simple cold meal prep (sandwich) and/or light housekeeping with supervision    Baseline not completing at this time    Time 4    Period Weeks    Status Achieved   04/07/20 - pt reports making cereal but not sandwich and participating in some home management            OT Long Term Goals - 05/10/20 1114      OT [place] #1   Title Pt will be independent with updated HEP 03/11/2020    Baseline not issued yet    Time 8    Period Weeks    Status On-going      OT LONG TERM GOAL #2   Title Pt will perform simple warm meal prep and/or light house keeping task with mod I and good safety awareness    Baseline not completing at eval    Time 8    Period Weeks    Status Deferred   cooked an egg with min to  mod A but demonstrating much difficulty with BUE use for tasks     OT LONG TERM GOAL #3   Title Pt will improve grip strengthening in LUE by 10 lbs and RUE by 6 lbs for increased ability to perform clothing management for ADLs    Baseline LUE 29.5 RUE 5    Time 8    Period Weeks    Status Deferred   Pt has had decline in grip strength since eval d/t neuropathy and CIPD     OT LONG TERM GOAL #4   Title Pt will increased box and blocks score in RUE to 12 blocks or more for increase in functional use of RUE    Baseline RUE 6    Time 8    Period Weeks    Status On-going   04/07/20 - 7 blocks     OT LONG TERM GOAL #5   Title Pt will report decrease pain in RUE wrist with functional use to no more than 6/10    Baseline 9/10    Time 8    Period Weeks    Status Achieved   3/10     Long Term Additional Goals   Additional Long Term Goals Yes      OT LONG TERM GOAL #6   Title Pt will verbalize understanding of adapted equipment and strategies PRN for basic ADLs and IADLs.    Baseline difficulty with donning socks, cutting up food and fasteners    Time 8    Period Weeks    Status On-going   continue to address - pt received elastic shoelaces and foam grip.     OT LONG TERM GOAL #7   Title Pt will be compliant and verbalize understanding of wear and care instructions for any splints and/or orthoses PRN    Baseline RUE dorsal wrist cock up splint made - continue to address and work on one for the left.    Time 4    Period Weeks    Status New  Plan - 05/17/20 1112    Clinical Impression Statement Began fabrication of L wrist splint to incr LUE functional use, but unable to complete today due to time constaints.  Pt demo continued difficulty with L 2point pinch.    OT Occupational Profile and History Problem Focused Assessment - Including review of records relating to presenting problem    Occupational performance deficits (Please refer to evaluation for details):  IADL's;ADL's;Leisure;Social Participation    Body Structure / Function / Physical Skills ADL;Balance;Coordination;Decreased knowledge of use of DME;Edema;Flexibility;FMC;Dexterity;Strength;GMC;Pain;Tone;UE functional use;ROM;IADL;Sensation    Psychosocial Skills Coping Strategies    Rehab Potential Good    Clinical Decision Making Limited treatment options, no task modification necessary    Comorbidities Affecting Occupational Performance: None    Modification or Assistance to Complete Evaluation  No modification of tasks or assist necessary to complete eval    OT Frequency 2x / week    OT Duration 4 weeks   2x/week for 4 weeks after renewal could be different d/t scheduling conflicts.   OT Treatment/Interventions Self-care/ADL training;Moist Heat;DME and/or AE instruction;Splinting;Balance training;Therapeutic activities;Aquatic Therapy;Therapeutic exercise;Ultrasound;Electrical Stimulation;Cryotherapy;Neuromuscular education;Paraffin;Energy conservation;Manual Therapy;Patient/family education;Passive range of motion;Functional Mobility Training    Plan Complete L dorsal wrist cock up splint and practice with use .  Address index PIP flexion left, any adapted strategies/equipment for increasing independence and function. pt with benefit from u-cuff and could benefit from more practice with it. possible information about getting one?    Recommended Other Services Pt is receiving PT    Consulted and Agree with Plan of Care Patient;Family member/caregiver    Family Member Consulted grandmother           Patient will benefit from skilled therapeutic intervention in order to improve the following deficits and impairments:   Body Structure / Function / Physical Skills: ADL,Balance,Coordination,Decreased knowledge of use of DME,Edema,Flexibility,FMC,Dexterity,Strength,GMC,Pain,Tone,UE functional use,ROM,IADL,Sensation   Psychosocial Skills: Coping Strategies   Visit Diagnosis: Muscle weakness  (generalized)  Unsteadiness on feet  Pain in right wrist  Other lack of coordination  Other symptoms and signs involving the nervous system  Other disturbances of skin sensation    Problem List Patient Active Problem List   Diagnosis Date Noted  . Autonomic neuropathy associated with type 1 diabetes mellitus ([place]) 12/03/2019  . DKA, type 1 ([place]) 10/23/2019  . DKA (diabetic ketoacidosis) ([place]) 10/21/2019  . COVID-19 virus infection 10/21/2019  . Diabetic polyneuropathy associated with type 1 diabetes mellitus ([place]) 02/26/2019  . Leg weakness, bilateral 02/25/2019  . Postprandial bloating 02/25/2019  . Sensory problems with limbs 02/25/2019  . Ketonuria 11/04/2018  . Diabetes ([place]) 04/23/2018  . Uncontrolled type 1 diabetes mellitus with hyperglycemia ([place]) 05/24/2017  . Morbid childhood obesity with BMI greater than 99th percentile for age [facility]) 05/24/2017  . Insulin resistance 05/24/2017  . Acanthosis 05/24/2017    [facility] 05/17/2020, 12:21 PM  [facility] [address] [place], [place], [address] Phone: [phone]   Fax:  [phone]  Name: Dwan Hemmelgarn MRN: [id number] Date of Birth: [birth year]   Vianne Bulls, OTR/L [facility] [address] [place], [place]  [address] [phone] phone [phone] 05/17/20 12:21 PM

## 2020-05-17 NOTE — Therapy (Signed)
[facility] [address] [place], [place], [address] Phone: [phone]   Fax:  [phone]  Physical Therapy Treatment  Patient Details  Name: Lucas Cunningham MRN: [id number] Date of Birth: 26-Sep-2000 Referring Provider (PT): [name]   Encounter Date: 05/17/2020   PT End of Session - 05/17/20 1322    Visit Number 18    Number of Visits 23    Authorization Type [employer]    Authorization Time Period 16 visits 12/8-2/02/22 - awaiting auth for more visits (used 9 visits), 12 visits approved from 2/16-5/27    Authorization - Visit Number 7    Authorization - Number of Visits 12    PT Start Time 4270    PT Stop Time 1230    PT Time Calculation (min) 43 min    Equipment Utilized During Treatment Gait belt    Activity Tolerance Patient tolerated treatment well;No increased pain    Behavior During Therapy WFL for tasks assessed/performed           Past Medical History:  Diagnosis Date  . Diabetes mellitus without complication ([place])    type 1    Past Surgical History:  Procedure Laterality Date  . NO PAST SURGERIES      There were no vitals filed for this visit.   Subjective Assessment - 05/17/20 1150    Subjective Doing okay, having some pain in first toe on both sides.  He also reports that he is to have an infusion this week instead of next week.  He was unable to verify and reports that grandmother told him this.    Limitations Walking;Standing;House hold activities    Patient Stated Goals "I want to be able to bring in groceries and help around the house."    Currently in Pain? Yes    Pain Score 5     Pain Location Toe (Comment which one)   big toe   Pain Orientation Right;Left    Pain Descriptors / Indicators Cramping    Pain Type Acute pain;Neuropathic pain    Pain Onset 1 to 4 weeks ago    Pain Frequency Intermittent    Aggravating Factors  nothing    Pain Relieving  Factors nothing                             [facility] Adult PT Treatment/Exercise - 05/17/20 1155      Transfers   Transfers Sit to Stand;Stand to Sit    Sit to Stand 5: Supervision    Stand to Sit 5: Supervision      Ambulation/Gait   Ambulation/Gait Yes    Ambulation/Gait Assistance 5: Supervision    Ambulation/Gait Assistance Details Around gym with RW and R HO and B AFOs at S level.  Min cues for posture and keeping hips forward throughout.  Esp at end of session when leaving clinic had him try to use RW less and keep posture more erect as he tends to flex posture forward and lock knees into extension.    Ambulation Distance (Feet) 100 Feet   x 2 reps   Assistive device Rolling walker   R hand orthosis and B AFOs   Gait Pattern Step-through pattern;Decreased stride length;Decreased dorsiflexion - right;Decreased dorsiflexion - left;Right steppage;Left steppage;Wide base of support;Trunk flexed    Ambulation Surface Level;Indoor      Neuro Re-ed    Neuro Re-ed Details  In // bars  working on dynamic standing balance:  feet apart attempting to hold ball with BUEs however this was too difficult therefore had pt lift one arm into flexion and then before it returned to bar, lift opposite UE into flexion x 10 reps.  He began with little lifts then cued to increase shoulder flexion and increase time in unsupported balance x another 10 reps.  Then had pt work on lateral weight shift and forward/retro step x 10 reps on each side with light faciliation at hips/pelvis as he tends to want to rotate trunk during motion.  Progressed to having him hit light ball back to PT x 10 reps on each side with as little support from opposite hand then PT tossing to random side.  Pt did well incorporating step strategy as needed, he does need intermittent support esp when cued to keep knees "soft" and not locked in extension.  Ended with pt ambulating x 4 laps in // bars without support with same  emphasis as stepping tasks from before, with foward/lateral weight shift and keeping quads active but knees unlocked.  Pt did well with task, reporting this "feels weird".  Educated how muscles are more active in this position and to work on this at home in hallway rather than ambulating with knees in extension out of "habit".  Pt verbalized understanding.      Knee/Hip Exercises: Aerobic   Stepper Scifit stepper with BLEs only at level 4 resistance x 6 mins for overall strengthening and endurance.  Pt tolerated well.                    PT Short Term Goals - 05/03/20 1208      PT SHORT TERM GOAL #1   Title Pt will obtain B AFOs in order to to decrease fall risk and improve efficiency of gait. ALL STGS DUE 04/18/20    Baseline received 05/03/20    Time 3    Period Weeks    Status Achieved    Target Date 04/18/20      PT SHORT TERM GOAL #2   Title Pt will be able to stand statically without support x10  secs at RW with min guard in order to indicate improved balance and strength.    Baseline unable to let go of RW today 05/03/20    Time 3    Period Weeks    Status Not Met      PT SHORT TERM GOAL #3   Title Pt will improve gait speed to >/= 2.16 ft/sec w/ LRAD and B AFO as needed to indicate dec fall risk.    Baseline 2.44 ft/sec with B AFOs and RW    Time 3    Period Weeks    Status Achieved      PT SHORT TERM GOAL #4   Title Pt will improve 5TSS to </=18 secs with BUE support in order to indicate improved strength.    Baseline 26.10 secs with BUE support from mat table 05/03/20    Time 3    Period Weeks    Status Not Met      PT SHORT TERM GOAL #5   Title Pt will decr TUG with B AFOs and RW to 17 seconds or less in order to demo decr fall risk.    Baseline 28.88 secs with B AFOs and RW    Time 3    Period Weeks    Status Not Met  PT Long Term Goals - 05/10/20 1252      PT LONG TERM GOAL #1   Title Pt will be IND with final HEP in order to indicate  improved functional mobility and dec fall risk.  ALL LTGS DUE 05/28/20 (updated due to missing 3 weeks)    Baseline independent with current HEP, will continue to benefit from additions    Time 6    Period Weeks    Status New      PT LONG TERM GOAL #2   Title Pt will improve gait speed to >/=2.62 ft/sec with LRAD and B AFOs as needed in order to indicate safe community ambulation.    Baseline 17 seconds = 1.92 ft/sec with RW and no AFOs on 03/26/20    Time 6    Period Weeks    Status On-going      PT LONG TERM GOAL #3   Title Pt will improve 5TSS to </=16 secs with single UE support only in order to indicate improved functional strength.    Baseline 21.75 secs with BUE support, 21.34 for 2nd attempt with BUE support from mat table on 03/26/20    Time 6    Period Weeks    Status On-going      PT LONG TERM GOAL #4   Title Pt will ambulate x 500' over unlevel paved surfaces w/ LRAD and B AFOs as needed at mod I level in order to indicate safe community mobility.    Baseline due to time constraints, unable to assess on 03/26/20    Time 6    Period Weeks    Status On-going      PT LONG TERM GOAL #5   Title Pt will perform dynamic standing balance with intermittent UE support x 3 mins in order to perform ADLs safely at home.    Baseline on 03/26/20: static standing needing UE support, able to let go for approx. 5 seconds at a time, stood for a total on 2 minutes and 30 seconds    Time 6    Period Weeks    Status Revised                 Plan - 05/17/20 1323    Clinical Impression Statement Skilled session focused on BLE strengthening as well as balance with decreasing UE support working in more dynamic conditions as he would at home.  Pt is making slow progress, however feel that carryover is difficult as he verbalizes that he does many things out of "habit."  He reports he has infusion this week, however when POC was set, he reports it is 4/15.  Discussed that POC would end 4/15 to  take a break and allow him to work on things at home and allow time for recovery from infusions.  Pt verbalized understanding.    Personal Factors and Comorbidities Comorbidity 3+;Time since onset of injury/illness/exacerbation    Comorbidities see above    Examination-Activity Limitations Bathing;Dressing;Locomotion Level;Transfers;Squat;Stairs;Stand    Examination-Participation Restrictions Community Activity;Driving;School;Yard Work   Occupational hygienist Evolving/Moderate complexity    Rehab Potential Good    PT Frequency 2x / week    PT Duration 6 weeks    PT Treatment/Interventions ADLs/Self Care Home Management;DME Instruction;Gait training;Stair training;Functional mobility training;Therapeutic activities;Therapeutic exercise;Balance training;Neuromuscular re-education;Patient/family education;Orthotic Fit/Training;Passive range of motion;Vestibular    PT Next Visit Plan [facility] at [facility] on [facility]7 so can continue to remind him of this.  Work towards [employer]  as I only scheduled him through next infusion date to avoid missing so many appts.  Continue BLE strength, activity tolerance and balance.    Consulted and Agree with Plan of Care Patient    Family Member Consulted grandmother           Patient will benefit from skilled therapeutic intervention in order to improve the following deficits and impairments:  Abnormal gait,Decreased activity tolerance,Decreased balance,Decreased endurance,Decreased knowledge of precautions,Decreased knowledge of use of DME,Decreased mobility,Decreased safety awareness,Decreased strength,Difficulty walking,Impaired perceived functional ability,Impaired sensation,Impaired UE functional use,Postural dysfunction  Visit Diagnosis: Muscle weakness (generalized)  Unsteadiness on feet  Other disturbances of skin sensation  Other abnormalities of gait and mobility  Foot drop, left  Foot drop, right     Problem  List Patient Active Problem List   Diagnosis Date Noted  . Autonomic neuropathy associated with type 1 diabetes mellitus ([place]) 12/03/2019  . DKA, type 1 ([place]) 10/23/2019  . DKA (diabetic ketoacidosis) ([place]) 10/21/2019  . COVID-19 virus infection 10/21/2019  . Diabetic polyneuropathy associated with type 1 diabetes mellitus ([place]) 02/26/2019  . Leg weakness, bilateral 02/25/2019  . Postprandial bloating 02/25/2019  . Sensory problems with limbs 02/25/2019  . Ketonuria 11/04/2018  . Diabetes ([place]) 04/23/2018  . Uncontrolled type 1 diabetes mellitus with hyperglycemia ([place]) 05/24/2017  . Morbid childhood obesity with BMI greater than 99th percentile for age [facility]) 05/24/2017  . Insulin resistance 05/24/2017  . Acanthosis 05/24/2017    Cameron Sprang, PT, MPT [facility] [address] [place], [place], [address] Phone: [phone]   Fax:  [phone] 05/17/20, 1:27 PM  Name: Rockie Schnoor MRN: [id number] Date of Birth: Feb 03, 2001

## 2020-05-18 ENCOUNTER — Ambulatory Visit (INDEPENDENT_AMBULATORY_CARE_PROVIDER_SITE_OTHER): Payer: Medicaid Other | Admitting: Pediatrics

## 2020-05-19 ENCOUNTER — Ambulatory Visit: Payer: Medicaid Other | Admitting: Rehabilitation

## 2020-05-24 ENCOUNTER — Ambulatory Visit: Payer: Medicaid Other | Admitting: Rehabilitation

## 2020-05-24 ENCOUNTER — Ambulatory Visit: Payer: Medicaid Other | Admitting: Occupational Therapy

## 2020-05-24 ENCOUNTER — Encounter: Payer: Self-pay | Admitting: Rehabilitation

## 2020-05-24 ENCOUNTER — Other Ambulatory Visit: Payer: Self-pay

## 2020-05-24 DIAGNOSIS — M21371 Foot drop, right foot: Secondary | ICD-10-CM

## 2020-05-24 DIAGNOSIS — R2681 Unsteadiness on feet: Secondary | ICD-10-CM

## 2020-05-24 DIAGNOSIS — R208 Other disturbances of skin sensation: Secondary | ICD-10-CM

## 2020-05-24 DIAGNOSIS — R2689 Other abnormalities of gait and mobility: Secondary | ICD-10-CM

## 2020-05-24 DIAGNOSIS — M6281 Muscle weakness (generalized): Secondary | ICD-10-CM

## 2020-05-24 DIAGNOSIS — R29818 Other symptoms and signs involving the nervous system: Secondary | ICD-10-CM

## 2020-05-24 DIAGNOSIS — M21372 Foot drop, left foot: Secondary | ICD-10-CM

## 2020-05-24 DIAGNOSIS — R278 Other lack of coordination: Secondary | ICD-10-CM

## 2020-05-24 NOTE — Therapy (Signed)
[facility] [address] [place], [place], [address] Phone: [phone]   Fax:  [phone]  Occupational Therapy Treatment  Patient Details  Name: Lucas Cunningham MRN: [id number] Date of Birth: September 14, 2000 Referring Provider (OT): Percell Miller, NP   Encounter Date: 05/24/2020   OT End of Session - 05/24/20 2202    Visit Number 16    Number of Visits 22   +8 at renewal - may be less d/t scheduling   Date for OT Re-Evaluation 06/07/20    Authorization Type [employer]    Authorization Time Period Renewal completed 05/10/2020 for 4 weeks    OT Start Time 1230    OT Stop Time 1315    OT Time Calculation (min) 45 min    Activity Tolerance Patient tolerated treatment well    Behavior During Therapy [facility] for tasks assessed/performed           Past Medical History:  Diagnosis Date  . Diabetes mellitus without complication ([place])    type 1    Past Surgical History:  Procedure Laterality Date  . NO PAST SURGERIES      There were no vitals filed for this visit.   Subjective Assessment - 05/24/20 1233    Subjective  "feels like when your hand falls asleep and it comes back, that's what it feels like"    Pertinent History PMH Type I diabetes, depression, peripheral neuropathy    Limitations Fall Risk. Sensation deficits in RUE    Patient Stated Goals Pt wants "to be able to grab things and write and use my fingers"    Currently in Pain? No/denies               TREATMENT:  Splints: practiced donning and doffing. Pt required min A For donning LUE dorsal wrist cock up splint. Pt reported that the splints work well but he wouldn't think about using them at home, therefore would not be useful.   Functional use of BUE: with wear of BUE dorsal wrist cock up splints. Pt with picking up and placing 1 inch cubes with good tip pinch and Elimination of supination for picking up objects. Pt with increased difficulty  with smaller object of large pegs with BUE and placing into foam board. Pt worked on removing large pegs from peg board with very limited extension for release of object with RUE and poor strength making picking up large pegs difficulty. Pt with increaesed ability to grasp objects with use of splints. Simulated brushing teeth with splints on - pt with benefit of using wrist for maintaining neutral wrist position while brushing teeth.  Universal Cuff: simulated brushing teeth with difficulty with maintaining wrist extension for task. Pt interested in using u-cuff for self-feeding and wants more information about obtaining one for home.                  OT Short Term Goals - 05/10/20 1108      OT SHORT TERM GOAL #1   Title Pt will be independent with initial HEP targeting grip strength and range of motion    Baseline not issued yet    Time 4    Period Weeks    Status Achieved    Target Date 02/12/20      OT SHORT TERM GOAL #2   Title Pt will verbalize understanding of sensory strategies for RUE and safety    Baseline have not reviewed    Time 4    Period  Weeks    Status Achieved   received education on sensory strategies and safety for poor sensation in BUE     OT SHORT TERM GOAL #3   Title Pt will increase grip strength in LUE by 5 lbs and RUE by 2 lbs for increase in functional use of BUE for ADLs and IADLs    Baseline RUE 5 LUE 29.5    Time 4    Period Weeks    Status Not Met   04/07/20 LUE 14.5 lbs RUE 2.4 lbs  05/10/20 LUE 13.4bs RUE 1.9 lbs     OT SHORT TERM GOAL #4   Title Pt will perform donning and doffing socks and shoes with supervision for increase in independence with ADLs    Baseline unable to don socks consistently    Time 4    Period Weeks    Status Partially Met   pt is not able to put socks on without physical assistance but able to put shoes on. d/t limited grip pt has increased difficulty with donning socks and new AFOs. Pt has been issued elastic  shoelaces     OT SHORT TERM GOAL #5   Title Pt will perform simple cold meal prep (sandwich) and/or light housekeeping with supervision    Baseline not completing at this time    Time 4    Period Weeks    Status Achieved   04/07/20 - pt reports making cereal but not sandwich and participating in some home management            OT Long Term Goals - 05/10/20 1114      OT [place] #1   Title Pt will be independent with updated HEP 03/11/2020    Baseline not issued yet    Time 8    Period Weeks    Status On-going      OT LONG TERM GOAL #2   Title Pt will perform simple warm meal prep and/or light house keeping task with mod I and good safety awareness    Baseline not completing at eval    Time 8    Period Weeks    Status Deferred   cooked an egg with min to mod A but demonstrating much difficulty with BUE use for tasks     OT LONG TERM GOAL #3   Title Pt will improve grip strengthening in LUE by 10 lbs and RUE by 6 lbs for increased ability to perform clothing management for ADLs    Baseline LUE 29.5 RUE 5    Time 8    Period Weeks    Status Deferred   Pt has had decline in grip strength since eval d/t neuropathy and CIPD     OT LONG TERM GOAL #4   Title Pt will increased box and blocks score in RUE to 12 blocks or more for increase in functional use of RUE    Baseline RUE 6    Time 8    Period Weeks    Status On-going   04/07/20 - 7 blocks     OT LONG TERM GOAL #5   Title Pt will report decrease pain in RUE wrist with functional use to no more than 6/10    Baseline 9/10    Time 8    Period Weeks    Status Achieved   3/10     Long Term Additional Goals   Additional Long Term Goals Yes      OT LONG TERM  GOAL #6   Title Pt will verbalize understanding of adapted equipment and strategies PRN for basic ADLs and IADLs.    Baseline difficulty with donning socks, cutting up food and fasteners    Time 8    Period Weeks    Status On-going   continue to address - pt  received elastic shoelaces and foam grip.     OT LONG TERM GOAL #7   Title Pt will be compliant and verbalize understanding of wear and care instructions for any splints and/or orthoses PRN    Baseline RUE dorsal wrist cock up splint made - continue to address and work on one for the left.    Time 4    Period Weeks    Status New                 Plan - 05/24/20 1339    Clinical Impression Statement Continued difficulty with functional use of BUE and maintaining indepenence with ADLs and IADLs. Pt with limited initiation for using adapted strategies in order to continue increasing independence with ADLs and IADLs.    OT Occupational Profile and History Problem Focused Assessment - Including review of records relating to presenting problem    Occupational performance deficits (Please refer to evaluation for details): IADL's;ADL's;Leisure;Social Participation    Body Structure / Function / Physical Skills ADL;Balance;Coordination;Decreased knowledge of use of DME;Edema;Flexibility;FMC;Dexterity;Strength;GMC;Pain;Tone;UE functional use;ROM;IADL;Sensation    Psychosocial Skills Coping Strategies    Rehab Potential Good    Clinical Decision Making Limited treatment options, no task modification necessary    Comorbidities Affecting Occupational Performance: None    Modification or Assistance to Complete Evaluation  No modification of tasks or assist necessary to complete eval    OT Frequency 2x / week    OT Duration 4 weeks   2x/week for 4 weeks after renewal could be different d/t scheduling conflicts.   OT Treatment/Interventions Self-care/ADL training;Moist Heat;DME and/or AE instruction;Splinting;Balance training;Therapeutic activities;Aquatic Therapy;Therapeutic exercise;Ultrasound;Electrical Stimulation;Cryotherapy;Neuromuscular education;Paraffin;Energy conservation;Manual Therapy;Patient/family education;Passive range of motion;Functional Mobility Training    Plan Complete L dorsal  wrist cock up splint and practice with use .  look into obtaining universal cuffs, focus more on increasing independence with adapted startegies and/or equipment with current functional level.    Recommended Other Services Pt is receiving PT    Consulted and Agree with Plan of Care Patient;Family member/caregiver    Family Member Consulted grandmother           Patient will benefit from skilled therapeutic intervention in order to improve the following deficits and impairments:   Body Structure / Function / Physical Skills: ADL,Balance,Coordination,Decreased knowledge of use of DME,Edema,Flexibility,FMC,Dexterity,Strength,GMC,Pain,Tone,UE functional use,ROM,IADL,Sensation   Psychosocial Skills: Coping Strategies   Visit Diagnosis: Muscle weakness (generalized)  Other disturbances of skin sensation  Unsteadiness on feet  Other abnormalities of gait and mobility  Other lack of coordination  Other symptoms and signs involving the nervous system    Problem List Patient Active Problem List   Diagnosis Date Noted  . Autonomic neuropathy associated with type 1 diabetes mellitus ([place]) 12/03/2019  . DKA, type 1 ([place]) 10/23/2019  . DKA (diabetic ketoacidosis) ([place]) 10/21/2019  . COVID-19 virus infection 10/21/2019  . Diabetic polyneuropathy associated with type 1 diabetes mellitus ([place]) 02/26/2019  . Leg weakness, bilateral 02/25/2019  . Postprandial bloating 02/25/2019  . Sensory problems with limbs 02/25/2019  . Ketonuria 11/04/2018  . Diabetes ([place]) 04/23/2018  . Uncontrolled type 1 diabetes mellitus with hyperglycemia ([place]) 05/24/2017  . Morbid childhood obesity  with BMI greater than 99th percentile for age [facility] 05/24/2017  . Insulin resistance 05/24/2017  . Acanthosis 05/24/2017    Zachery Conch MOT, OTR/L  05/24/2020, 1:43 PM  [facility] [address] [place], [place], [address] Phone: [phone]    Fax:  [phone]  Name: Lucas Cunningham MRN: [id number] Date of Birth: 11/17/2000

## 2020-05-24 NOTE — Therapy (Signed)
[facility] [address] [place], [place], [address] Phone: [phone]   Fax:  [phone]  Physical Therapy Treatment  Patient Details  Name: Lucas Cunningham MRN: [id number] Date of Birth: May 11, 2000 Referring Provider (PT): Lance Morin Torric NP   Encounter Date: 05/24/2020   PT End of Session - 05/24/20 1237    Visit Number 19    Number of Visits 23    Authorization Type [employer]    Authorization Time Period 16 visits 12/8-2/02/22 - awaiting auth for more visits (used 9 visits), 12 visits approved from 2/16-5/27    Authorization - Visit Number 8    Authorization - Number of Visits 12    PT Start Time 1150    PT Stop Time 1230    PT Time Calculation (min) 40 min    Equipment Utilized During Treatment Gait belt    Activity Tolerance Patient tolerated treatment well;No increased pain    Behavior During Therapy WFL for tasks assessed/performed           Past Medical History:  Diagnosis Date  . Diabetes mellitus without complication ([facility])    type 1    Past Surgical History:  Procedure Laterality Date  . NO PAST SURGERIES      There were no vitals filed for this visit.   Subjective Assessment - 05/24/20 1156    Subjective Had infusion two days last week (Wed/Thurs).  Had to call and reschedule visit with [facility] to get diabetic shoe fitting.    Patient is accompained by: Family member    Limitations Walking;Standing;House hold activities    Patient Stated Goals "I want to be able to bring in groceries and help around the house."    Currently in Pain? No/denies                             [facility] Adult PT Treatment/Exercise - 05/24/20 1209      Transfers   Transfers Sit to Stand;Stand to Sit    Sit to Stand 5: Supervision    Stand to Sit 5: Supervision    Comments Worked on sit<>stand from elevated mat to RW with as little UE support as possible to work on improved LE  strength and improved hip extension.      Ambulation/Gait   Ambulation/Gait Yes    Ambulation/Gait Assistance 5: Supervision;4: Min guard    Ambulation/Gait Assistance Details Pt able to ambulate at S level on unlevel paved surfaces with RW and B AFOs (preferred to remove HO at the time), however needed more min/guard esp for walker management when on uneven grassy surface to lift RW and place in correct positioning.    Ambulation Distance (Feet) 500 Feet    Assistive device Rolling walker    Gait Pattern Step-through pattern;Decreased stride length;Decreased dorsiflexion - right;Decreased dorsiflexion - left;Right steppage;Left steppage;Wide base of support;Trunk flexed    Ambulation Surface Level;Outdoor    Stairs Yes    Stairs Assistance 4: Min guard;4: Careers adviser Details (indicate cue type and reason) Step up/down to first step only with BUE support x 8 reps on each side with cues for achieving upright posture throughout    Curb 5: Supervision    Curb Details (indicate cue type and reason) Attempted to educate on appropriate way to perform curb step with RW, however pt reporting he has his own way of performing.  He "worksScientist, forensic down  off curb, however his feet are still very far back from curb step causing him to be very far foward over RW, causing safety concerns therefore PT educated and demonstrated getting all the way to edge of curb prior to descending curb.      Neuro Re-ed    Neuro Re-ed Details  At counter top worked on hip bumps off counter x 10 reps with RW in front for single UE support only with cues for slower motion and improved hip extension to allow "softer" knees as he continues to maintain locked into extension for stability.                  PT Education - 05/24/20 1236    Education Details Educated pt again that this would be his last week so that he may take a break from PT to work at home, get diabetic shoes and go through remaining IVIG  infusions. Pt did not recall this conversation from previous sessions.    Person(s) Educated Patient    Methods Explanation    Comprehension Verbalized understanding            PT Short Term Goals - 05/03/20 1208      PT SHORT TERM GOAL #1   Title Pt will obtain B AFOs in order to to decrease fall risk and improve efficiency of gait. ALL STGS DUE 04/18/20    Baseline received 05/03/20    Time 3    Period Weeks    Status Achieved    Target Date 04/18/20      PT SHORT TERM GOAL #2   Title Pt will be able to stand statically without support x10  secs at RW with min guard in order to indicate improved balance and strength.    Baseline unable to let go of RW today 05/03/20    Time 3    Period Weeks    Status Not Met      PT SHORT TERM GOAL #3   Title Pt will improve gait speed to >/= 2.16 ft/sec w/ LRAD and B AFO as needed to indicate dec fall risk.    Baseline 2.44 ft/sec with B AFOs and RW    Time 3    Period Weeks    Status Achieved      PT SHORT TERM GOAL #4   Title Pt will improve 5TSS to </=18 secs with BUE support in order to indicate improved strength.    Baseline 26.10 secs with BUE support from mat table 05/03/20    Time 3    Period Weeks    Status Not Met      PT SHORT TERM GOAL #5   Title Pt will decr TUG with B AFOs and RW to 17 seconds or less in order to demo decr fall risk.    Baseline 28.88 secs with B AFOs and RW    Time 3    Period Weeks    Status Not Met             PT Long Term Goals - 05/10/20 1252      PT LONG TERM GOAL #1   Title Pt will be IND with final HEP in order to indicate improved functional mobility and dec fall risk.  ALL LTGS DUE 05/28/20 (updated due to missing 3 weeks)    Baseline independent with current HEP, will continue to benefit from additions    Time 6    Period Weeks    Status New  PT LONG TERM GOAL #2   Title Pt will improve gait speed to >/=2.62 ft/sec with LRAD and B AFOs as needed in order to indicate safe  community ambulation.    Baseline 17 seconds = 1.92 ft/sec with RW and no AFOs on 03/26/20    Time 6    Period Weeks    Status On-going      PT LONG TERM GOAL #3   Title Pt will improve 5TSS to </=16 secs with single UE support only in order to indicate improved functional strength.    Baseline 21.75 secs with BUE support, 21.34 for 2nd attempt with BUE support from mat table on 03/26/20    Time 6    Period Weeks    Status On-going      PT LONG TERM GOAL #4   Title Pt will ambulate x 500' over unlevel paved surfaces w/ LRAD and B AFOs as needed at mod I level in order to indicate safe community mobility.    Baseline due to time constraints, unable to assess on 03/26/20    Time 6    Period Weeks    Status On-going      PT LONG TERM GOAL #5   Title Pt will perform dynamic standing balance with intermittent UE support x 3 mins in order to perform ADLs safely at home.    Baseline on 03/26/20: static standing needing UE support, able to let go for approx. 5 seconds at a time, stood for a total on 2 minutes and 30 seconds    Time 6    Period Weeks    Status Revised                 Plan - 05/24/20 1237    Clinical Impression Statement Skilled session focused on assessment of gait over varying outdoor surfaces to prepare for DC on Wednesday.  He is able to ambulate over unlevel surfaces at a S level however due to weakness in RUE needs min/guard to min A mostly for walker management over grassy surfaces.  Also continue to work on BLE strengthening esp in hips and knees to allow for more independent gait.  Will plan for LTG assessment and D/C at next sesssion.    Personal Factors and Comorbidities Comorbidity 3+;Time since onset of injury/illness/exacerbation    Comorbidities see above    Examination-Activity Limitations Bathing;Dressing;Locomotion Level;Transfers;Squat;Stairs;Stand    Examination-Participation Restrictions Community Activity;Driving;School;Yard Work   Chemical engineer Evolving/Moderate complexity    Rehab Potential Good    PT Frequency 2x / week    PT Duration 6 weeks    PT Treatment/Interventions ADLs/Self Care Home Management;DME Instruction;Gait training;Stair training;Functional mobility training;Therapeutic activities;Therapeutic exercise;Balance training;Neuromuscular re-education;Patient/family education;Orthotic Fit/Training;Passive range of motion;Vestibular    PT Next Visit Plan LTGs and DC    Consulted and Agree with Plan of Care Patient    Family Member Consulted grandmother           Patient will benefit from skilled therapeutic intervention in order to improve the following deficits and impairments:  Abnormal gait,Decreased activity tolerance,Decreased balance,Decreased endurance,Decreased knowledge of precautions,Decreased knowledge of use of DME,Decreased mobility,Decreased safety awareness,Decreased strength,Difficulty walking,Impaired perceived functional ability,Impaired sensation,Impaired UE functional use,Postural dysfunction  Visit Diagnosis: Muscle weakness (generalized)  Unsteadiness on feet  Other disturbances of skin sensation  Other abnormalities of gait and mobility  Foot drop, left  Foot drop, right     Problem List Patient Active Problem List   Diagnosis Date Noted  .  Autonomic neuropathy associated with type 1 diabetes mellitus ([place]) 12/03/2019  . DKA, type 1 ([place]) 10/23/2019  . DKA (diabetic ketoacidosis) ([place]) 10/21/2019  . COVID-19 virus infection 10/21/2019  . Diabetic polyneuropathy associated with type 1 diabetes mellitus ([place]) 02/26/2019  . Leg weakness, bilateral 02/25/2019  . Postprandial bloating 02/25/2019  . Sensory problems with limbs 02/25/2019  . Ketonuria 11/04/2018  . Diabetes ([place]) 04/23/2018  . Uncontrolled type 1 diabetes mellitus with hyperglycemia ([place]) 05/24/2017  . Morbid childhood obesity with BMI greater than 99th percentile for age [facility])  05/24/2017  . Insulin resistance 05/24/2017  . Acanthosis 05/24/2017    Cameron Sprang, PT, MPT [facility] [address] [place], [place], [address] Phone: [phone]   Fax:  [phone] 05/24/20, 12:44 PM  Name: Lucas Cunningham MRN: [id number] Date of Birth: 09-11-2000

## 2020-05-25 ENCOUNTER — Ambulatory Visit (INDEPENDENT_AMBULATORY_CARE_PROVIDER_SITE_OTHER): Payer: Medicaid Other | Admitting: Pediatrics

## 2020-05-25 ENCOUNTER — Encounter (INDEPENDENT_AMBULATORY_CARE_PROVIDER_SITE_OTHER): Payer: Self-pay | Admitting: Pediatrics

## 2020-05-25 ENCOUNTER — Telehealth (INDEPENDENT_AMBULATORY_CARE_PROVIDER_SITE_OTHER): Payer: Self-pay

## 2020-05-25 ENCOUNTER — Encounter (INDEPENDENT_AMBULATORY_CARE_PROVIDER_SITE_OTHER): Payer: Self-pay | Admitting: Dietician

## 2020-05-25 VITALS — BP 130/90 | HR 88 | Wt 270.2 lb

## 2020-05-25 DIAGNOSIS — R030 Elevated blood-pressure reading, without diagnosis of hypertension: Secondary | ICD-10-CM | POA: Diagnosis not present

## 2020-05-25 DIAGNOSIS — E1065 Type 1 diabetes mellitus with hyperglycemia: Secondary | ICD-10-CM

## 2020-05-25 DIAGNOSIS — E559 Vitamin D deficiency, unspecified: Secondary | ICD-10-CM

## 2020-05-25 DIAGNOSIS — G6181 Chronic inflammatory demyelinating polyneuritis: Secondary | ICD-10-CM | POA: Insufficient documentation

## 2020-05-25 DIAGNOSIS — Z9114 Patient's other noncompliance with medication regimen: Secondary | ICD-10-CM

## 2020-05-25 DIAGNOSIS — F32A Depression, unspecified: Secondary | ICD-10-CM

## 2020-05-25 DIAGNOSIS — Z91148 Patient's other noncompliance with medication regimen for other reason: Secondary | ICD-10-CM | POA: Insufficient documentation

## 2020-05-25 DIAGNOSIS — E782 Mixed hyperlipidemia: Secondary | ICD-10-CM

## 2020-05-25 LAB — POCT GLUCOSE (DEVICE FOR HOME USE): POC Glucose: 526 mg/dl — AB (ref 70–99)

## 2020-05-25 LAB — POCT GLYCOSYLATED HEMOGLOBIN (HGB A1C): Hemoglobin A1C: 13.9 % — AB (ref 4.0–5.6)

## 2020-05-25 LAB — POCT URINALYSIS DIPSTICK: Glucose, UA: POSITIVE — AB

## 2020-05-25 MED ORDER — TRESIBA FLEXTOUCH 200 UNIT/ML ~~LOC~~ SOPN: 100.0000 [IU] | PEN_INJECTOR | Freq: Every day | SUBCUTANEOUS | 6 refills | Status: DC

## 2020-05-25 MED ORDER — BD PEN NEEDLE NANO U/F 32G X 4 MM MISC: 6 refills | Status: DC

## 2020-05-25 MED ORDER — TRULICITY 0.75 MG/0.5ML ~~LOC~~ SOAJ: 0.7500 mg | SUBCUTANEOUS | 6 refills | Status: DC

## 2020-05-25 NOTE — Addendum Note (Signed)
Addended by: Buena Irish on: 05/25/2020 05:09 PM   Modules accepted: Orders

## 2020-05-25 NOTE — Progress Notes (Signed)
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    Skinner-Kiser, Tor Netters, NP   HPI: Lucas Cunningham  is a 20 y.o. 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  Last c-peptide 3.8 ng/mL 04/23/2018. 10/21/2019 he was again admitted for DKA with Covid-19 infection.  he is accompanied to this visit by his grandmother. And sister.  1. His diabetes management has been complicated by the death of his mother in May 28, 2019 and then his uncle died via suicide in 01-27-20.  He is depressed. He lives with his grandmother who also has diabetes.  She is fearful of giving him injections for fear of hurting him.  2. Since last visit to [facility] on 12/03/19 by Dr. Tobe Sos and Dr. Lovena Le March 04, 2020, he has been well.  He no showed to psychology appt with Dr. Mellody Dance 03/24/2020. They no showed to multiple appts with me. No ER visits or hospitalizations. He is receiving OT. He forgot to take his insulin today.  His last injection was last night. He misses multiple doses of insulin per day due to sleeping, or feeling pain in hands, so he decides to sleep.  He lacks the strength to push the plunger, and has to open boxes and pen with his mouth.  He has to use his mouth to put the pen needle on the insulin pen.   He also admits to drinking juice.    He has lost 5 pounds unintentionally.  Insulin regimen: Tresiba 80 units daily at bedtime (200 units/mL pen), HumalogU200 (times/dosing below), metformin XR 1000 mg daily after breakfast and 1000 mg daily afternoon i. Humalog ii. breakfast: 30 units iii. lunch: 30 units iv. dinner: 30  units His grandmother is giving at least one shot a day when his hands are tired.   Hypoglycemia: can feel most low blood sugars.  No glucagon needed recently.  Blood glucose download: Did not bring meter.  CGM download: Using Dexcom G6 continuous glucose monitor. Did not bring receiver.   Med-alert ID: is not currently wearing. Injection/Pump sites: trunk and upper extremity Annual labs due: needs fasting lipid panel, microalbumin/Cr ratio Ophthalmology due: when they can go. Flu vaccine:  COVID vaccine: Had Covid September 2021    3. ROS: Greater than 10 systems reviewed with pertinent positives listed in HPI, otherwise neg. Constitutional: weight loss, energy level poor Eyes: No changes in vision Ears/Nose/Mouth/Throat: No difficulty swallowing. Cardiovascular: No palpitations Respiratory: No increased work of breathing Gastrointestinal: No constipation or diarrhea. No abdominal pain Genitourinary: No nocturia, no polyuria Musculoskeletal: No joint pain Neurologic: Normal sensation, no tremor Endocrine: No polydipsia.  No hyperpigmentation Psychiatric: flat affect  Past Medical History:  Past Medical History:  Diagnosis Date  . CIDP (chronic inflammatory demyelinating polyneuropathy) ([place])   . Diabetes mellitus without complication ([place])    type 1    Medications: He is not taking Gabapentin.  Outpatient Encounter Medications as of 05/25/2020  Medication Sig Note  . atorvastatin (LIPITOR) 10 MG tablet Take 1 tablet by mouth daily.   . Continuous Blood Gluc Sensor (DEXCOM G6 SENSOR) MISC Inject 1 applicator into the skin as directed. (change sensor every 10 days)   . Continuous Blood  Gluc Transmit (DEXCOM G6 TRANSMITTER) MISC Inject 1 Device into the skin as directed. (re-use up to 8x with each new sensor)   . Insulin Degludec (TRESIBA FLEXTOUCH) 200 UNIT/ML SOPN 100 Units by Subconjunctival route daily. (Patient taking differently: Inject 100 Units into the skin at  bedtime.)   . insulin lispro (HUMALOG KWIKPEN) 200 UNIT/ML KwikPen Inject up to 100 units daily per provider instructions   . Insulin Pen Needle (BD PEN NEEDLE NANO U/F) 32G X 4 MM MISC Inject up to 8 times per day.   . metFORMIN (GLUCOPHAGE) 1000 MG tablet Take 1 tablet (1,000 mg total) by mouth 2 (two) times daily with a meal.   . PRESCRIPTION MEDICATION Inject into the vein. IVIG every 4 weeks. Order for new start sent to [facility] on 04/08/2020.   . Accu-Chek FastClix Lancets MISC 1 Device by Does not apply route as directed. Use with Fastclix lancet device to check blood sugar up to 8x daily. (diagnosis code E10.65) (Patient not taking: No sig reported)   . acetone, urine, test strip 1 strip by Does not apply route as needed for high blood sugar. Up to 8x daily if necessary (Patient not taking: No sig reported)   . Continuous Blood Gluc Receiver (DEXCOM G6 RECEIVER) DEVI 1 Device by Does not apply route as directed. (Patient not taking: Reported on 05/25/2020)   . gabapentin (NEURONTIN) 300 MG capsule Take 1 capsule (300 mg total) by mouth 2 (two) times daily. (Patient not taking: No sig reported) 12/03/2019: Has not started taking  . Glucagon (BAQSIMI TWO PACK) 3 MG/DOSE POWD Place 1 spray into the nose as directed. (Patient not taking: No sig reported)   . Glucose Blood (BLOOD GLUCOSE TEST STRIPS) STRP 1 Device by Other route as directed. To check blood sugar up to 8x per day (Patient not taking: Reported on 05/25/2020)   . Insulin Regular Human (AFREZZA) 4 & 8 & 12 units POWD Inhale up to 96 units daily (Patient not taking: Reported on 05/25/2020)   . ketoconazole (NIZORAL) 2 % cream Apply to fet daily. (Patient not taking: Reported on 05/25/2020)   . Lancets Misc. (ACCU-CHEK FASTCLIX LANCET) KIT 1 Device by Does not apply route as directed. Use with lancets to check blood sugar up to 8x daily.  (diagnosis code E10.65) (Patient not taking: No sig reported)   . ondansetron (ZOFRAN ODT)  8 MG disintegrating tablet Take 1 tablet (8 mg total) by mouth every 8 (eight) hours as needed for nausea or vomiting. (Patient not taking: No sig reported)    No facility-administered encounter medications on file as of 05/25/2020.    Allergies: No Known Allergies  Surgical History: Past Surgical History:  Procedure Laterality Date  . NO PAST SURGERIES      Family History:  Family History  Problem Relation Age of Onset  . Heart disease Mother   . Hypertension Mother   . Diabetes Maternal Grandmother   . Diabetes Maternal Grandfather   . Diabetes Paternal Grandmother   . Migraines Neg Hx   . Seizures Neg Hx   . Autism Neg Hx   . ADD / ADHD Neg Hx   . Anxiety disorder Neg Hx   . Depression Neg Hx   . Bipolar disorder Neg Hx   . Schizophrenia Neg Hx      Social History: Social History   Social History Narrative   Lives with his grandmother. He graduated HS 2021   Right handed   Caffeine:  2 cups/day     Physical Exam:  Vitals:   05/25/20 1401  BP: 130/90  Pulse: 88  Weight: 270 lb 3.2 oz (122.6 kg)   BP 130/90   Pulse 88   Wt 270 lb 3.2 oz (122.6 kg) Comment: with shoes  BMI 37.69 kg/m  Body mass index: body mass index is 37.69 kg/m. Blood pressure percentiles are not available for patients who are 18 years or older.  Ht Readings from Last 3 Encounters:  02/18/20 $RemoveB'5\' 11"'jNXGwRLw$  (1.803 m) (70 %, Z= 0.51)*  12/22/19 5' 11.26" (1.81 m) (73 %, Z= 0.61)*  10/21/19 6' (1.829 m) (81 %, Z= 0.88)*   * Growth percentiles are based on CDC (Boys, 2-20 Years) data.   Wt Readings from Last 3 Encounters:  05/25/20 270 lb 3.2 oz (122.6 kg) (>99 %, Z= 2.70)*  02/19/20 275 lb 6.4 oz (124.9 kg) (>99 %, Z= 2.77)*  02/18/20 284 lb (128.8 kg) (>99 %, Z= 2.88)*   * Growth percentiles are based on CDC (Boys, 2-20 Years) data.    Physical Exam Vitals reviewed.  Constitutional:      Appearance: Normal appearance. He is obese.  HENT:     Head: Normocephalic and atraumatic.      Nose: Nose normal.     Mouth/Throat:     Mouth: Mucous membranes are moist.  Eyes:     Extraocular Movements: Extraocular movements intact.     Comments: Allergic shiners  Cardiovascular:     Rate and Rhythm: Normal rate and regular rhythm.     Pulses: Normal pulses.     Heart sounds: No murmur heard.   Pulmonary:     Effort: Pulmonary effort is normal. No respiratory distress.  Abdominal:     General: There is no distension.     Palpations: Abdomen is soft.     Tenderness: There is no abdominal tenderness.  Musculoskeletal:     Cervical back: Normal range of motion and neck supple.     Comments: Decreased ROM  Skin:    General: Skin is warm.     Capillary Refill: Capillary refill takes less than 2 seconds.     Comments: Dry skin and moderate acanthosis  Neurological:     Mental Status: He is alert.     Motor: Weakness present.     Gait: Gait abnormal.     Comments: Using walker  Psychiatric:        Behavior: Behavior normal.     Comments: Flat affect      Labs: Last hemoglobin A1c:  Lab Results  Component Value Date   HGBA1C 13.9 (A) 05/25/2020   Results for orders placed or performed in visit on 05/25/20  POCT Glucose (Device for Home Use)  Result Value Ref Range   Glucose Fasting, POC     POC Glucose 526 (A) 70 - 99 mg/dl  POCT glycosylated hemoglobin (Hb A1C)  Result Value Ref Range   Hemoglobin A1C 13.9 (A) 4.0 - 5.6 %   HbA1c POC (<> result, manual entry)     HbA1c, POC (prediabetic range)     HbA1c, POC (controlled diabetic range)    POCT urinalysis dipstick  Result Value Ref Range   Color, UA     Clarity, UA     Glucose, UA Positive (A) Negative   Bilirubin, UA     Ketones, UA large    Spec Grav, UA     Blood, UA     pH, UA  Protein, UA     Urobilinogen, UA     Nitrite, UA     Leukocytes, UA     Appearance     Odor      Lab Results  Component Value Date   HGBA1C 13.9 (A) 05/25/2020   HGBA1C >14 01/26/2020   HGBA1C 14.4 (H)  10/21/2019    Lab Results  Component Value Date   CREATININE 0.46 (L) 02/18/2020    Ref. Range 02/18/2020 11:53  TSH Latest Ref Range: 0.450 - 4.500 uIU/mL 3.240    Assessment/Plan: Lucas Cunningham is a 20 y.o. male with Diabetes mellitus Type I, under poor control. Diabetes related complications: peripheral neuropathy and cardiovascular disease A1c is above goal of 7% or lower.  His diabetes management is complicated by depression and disability caused by chronic inflammatory demyelinating neuropathy.  He is unable to use the insulin pens properly to give his insulin.  He is unable to use Afreeza.  His grandmother makes sure and hands him his oral medications.  His grandmother was trained on giving injections today, but he often does not ask for help and she is scared to give him injections leading to insulin omission.  I am concerned that he could be hospitalized again.  He needs home health nursing to give his medications due to his inability to use his hands.   He had no symptoms of DKA and vital signs are stable.  In office, he received 16 unit of FiASP and 0.25mg  of Ozempic. He declined ER evaluation, so labs obtained in the office.  Due to his age, if admitted, he would have to go to the adult side.    When a patient is on insulin, intensive monitoring of blood glucose levels and continuous insulin titration is vital to avoid hyperglycemia and hypoglycemia. Severe hypoglycemia can lead to seizure or death. Hyperglycemia can lead to ketosis requiring ICU admission and intravenous insulin.   1. Uncontrolled type 1 diabetes mellitus with hyperglycemia (HCC)  - COLLECTION CAPILLARY BLOOD SPECIMEN - POCT Glucose (Device for Home Use) - POCT glycosylated hemoglobin (Hb A1C) - POCT urinalysis dipstick   Insulin Regimen: Basal: Tresiba 80 units Bolus: Humalog U200   Decrease Fixed dose of 20 units before each meal  Other medicaitons: Ozempic 0.25mg  SQ weekly on Tuesdays Metformin 1000mg   BID Atorvastatin 10 mg QAM  -We will call the disability assistance program to see what documentation they need. [phone], [phone] -We will also call [place] -Labs to be obtained today and if abnormal, will send to the ED -In the future, needs fasting lipid panel and microalbumin/Cr ratio     Discussed general issues about diabetes pathophysiology and management. Neurosurgeon distributed. Addressed ADA diet. Restarted insulin: as above. Labs: BMP, 25 OH Vitamin D, and cystatin C. Diabetes educator referral. Referral to psychology reminded to get eye exam  Follow-up:   Return in about 4 weeks (around 06/22/2020). to see me and follow up with Dr. Lovena Le (CDE/Pharmacist in 1 week). He has agreed that if he is unable to show that he can receive medication at home and check glucose, he is willing to be admitted to the hospital.   Medical decision-making:  I spent 120 minutes dedicated to the care of this patient on the date of this encounter to include pre-visit review of laboratory studies, diabetes education, insulin and GLP-1 administration, progress notes, face-to-face time with the patient, and post visit ordering of testing.  Thank you for the opportunity to participate in the  care of our mutual patient. Please do not hesitate to contact me should you have any questions regarding the assessment or treatment plan.   Sincerely,   Al Corpus, MD

## 2020-05-25 NOTE — Telephone Encounter (Signed)
-----   Message from Silvana Newness, MD sent at 05/25/2020  4:19 PM EDT ----- Please help with advanced [facility] and disability. Please call the disability assistance program to see what documentation they need. [phone], or [phone] -He needs home nursing, the hospital SW recommended calling [facility] [phone] -Can we also reschedule him to see Dr. Huntley Dec?

## 2020-05-25 NOTE — Patient Instructions (Addendum)
  DISCHARGE INSTRUCTIONS FOR Lucas Cunningham  05/25/2020  HbA1c Goals: Our ultimate goal is to achieve the lowest possible HbA1c while avoiding recurrent severe hypoglycemia.  However all HbA1c goals must be individualized. Age appropriate goals per the American Diabetes Association Clinical Standards are provided in chart above.  My Hemoglobin A1c History:  Lab Results  Component Value Date   HGBA1C 13.9 (A) 05/25/2020   HGBA1C >14 01/26/2020   HGBA1C 14.4 (H) 10/21/2019   HGBA1C 14.0 (A) 02/25/2019   HGBA1C >14 02/25/2019   HGBA1C 10.5 (A) 11/04/2018   HGBA1C 10.7 (H) 04/23/2018   HGBA1C 5.9 05/24/2017    My goal HbA1c is: < 7 %  This is equivalent to an average blood glucose of:  HbA1c % = Average BG  6  120   7  150   8  180   9  210   10  240   11  270   12  300   13  330    Insulin:   -Tresiba 80 units nightly  -Humalog fixed dose of 20 units before meals  -Ozempic 0.25mg  weekly (dose received today) --> Next Dose Tuesday 06/01/2020)  Medications:  -Metformin 1000mg  before breakfast and dinner -Atorvastatin 10 mg in the morning  Please allow 3 days for prescription refill requests!  Check Blood Glucose:   Before breakfast, before lunch, before dinner, at bedtime, and for symptoms of high or low blood glucose as a minimum.   Check BG 2 hours after meals if adjusting doses.    Check more frequently on days with more activity than normal.    Check in the middle of the night when evening insulin doses are changed, on days with extra activity in the evening, and if you suspect overnight low glucoses are occurring.   Send a MyChart message as needed for patterns of high or low glucose levels, or severe low glucoses.  As a general rule, ALWAYS call to review your child's blood glucoses IF:  Your child has a seizure  You have to use glucagon or glucose gel to bring up the blood sugar   IF you notice a pattern of high blood sugars  If in a week, your child  has:  1 blood glucose that is 40 or less   2 blood glucoses that are 50 or less at the same time of day  3 blood glucoses that are 60 or less at the same time of day  Phone:   Ketones:  Check urine or blood ketones if blood glucose is greater than 300 mg/dL (injections) or [name] mg/dL (pump), when ill, or if having symptoms of ketones.   Call if Urine Ketones are moderate or large  Call if Blood Ketones are moderate (1-1.5) or large (more than1.5)  Exercise Plan:   Any activity that makes you sweat most days for 60 minutes.   Safety:  Wear Medical Alert at ALL Times    Other:  Schedule an eye exam yearly and a dental exam and cleaning every 6 months.  Get a flu vaccine yearly unless contraindicated.

## 2020-05-26 ENCOUNTER — Other Ambulatory Visit: Payer: Self-pay

## 2020-05-26 ENCOUNTER — Encounter: Payer: Self-pay | Admitting: Rehabilitation

## 2020-05-26 ENCOUNTER — Telehealth (INDEPENDENT_AMBULATORY_CARE_PROVIDER_SITE_OTHER): Payer: Self-pay | Admitting: Pediatrics

## 2020-05-26 ENCOUNTER — Ambulatory Visit: Payer: Medicaid Other | Admitting: Rehabilitation

## 2020-05-26 DIAGNOSIS — R2689 Other abnormalities of gait and mobility: Secondary | ICD-10-CM

## 2020-05-26 DIAGNOSIS — M6281 Muscle weakness (generalized): Secondary | ICD-10-CM

## 2020-05-26 DIAGNOSIS — M21371 Foot drop, right foot: Secondary | ICD-10-CM

## 2020-05-26 DIAGNOSIS — M21372 Foot drop, left foot: Secondary | ICD-10-CM

## 2020-05-26 DIAGNOSIS — R208 Other disturbances of skin sensation: Secondary | ICD-10-CM

## 2020-05-26 DIAGNOSIS — R2681 Unsteadiness on feet: Secondary | ICD-10-CM

## 2020-05-26 NOTE — Therapy (Signed)
[facility] [address] [place], [place], [address] Phone: [phone]   Fax:  [phone]  Physical Therapy Treatment and D/C Summary   Patient Details  Name: Lucas Cunningham MRN: [id number] Date of Birth: 2000/11/16 Referring Provider (PT): [name]   Encounter Date: 05/26/2020   PT End of Session - 05/26/20 1304    Visit Number 20    Number of Visits 23    Authorization Type [employer]    Authorization Time Period 16 visits 12/8-2/02/22 - awaiting auth for more visits (used 9 visits), 12 visits approved from 2/16-5/27    Authorization - Visit Number 9    Authorization - Number of Visits 12    PT Start Time 3545    PT Stop Time 6256    PT Time Calculation (min) 43 min    Equipment Utilized During Treatment Gait belt    Activity Tolerance Patient tolerated treatment well;No increased pain    Behavior During Therapy WFL for tasks assessed/performed           Past Medical History:  Diagnosis Date  . CIDP (chronic inflammatory demyelinating polyneuropathy) ([place])   . Diabetes mellitus without complication ([place])    type 1    Past Surgical History:  Procedure Laterality Date  . NO PAST SURGERIES      There were no vitals filed for this visit.   Subjective Assessment - 05/26/20 1105    Subjective Doing okay today, no changes since last visit.    Limitations Walking;Standing;House hold activities    Patient Stated Goals "I want to be able to bring in groceries and help around the house."    Currently in Pain? No/denies                             [facility] Adult PT Treatment/Exercise - 05/26/20 1115      Transfers   Transfers Sit to Stand;Stand to Sit    Sit to Stand 5: Supervision    Five time sit to stand comments  27.53 secs with BUE support from arm chair, 22.40 secs with BUE support from elevated mat.  Continues to need BLE support on mat for stability.     Stand to Sit 5: Supervision      Ambulation/Gait   Ambulation/Gait Yes    Ambulation/Gait Assistance 6: Modified independent (Device/Increase time)    Ambulation/Gait Assistance Details Mod I for gait outdoors (unlevel paved surfaces) x 1000' with RW, hand orthosis and B AFOs.    Ambulation Distance (Feet) 1000 Feet    Assistive device Rolling walker    Gait Pattern Step-through pattern;Decreased stride length;Decreased dorsiflexion - right;Decreased dorsiflexion - left;Right steppage;Left steppage;Wide base of support;Trunk flexed    Ambulation Surface Unlevel;Outdoor;Paved    Gait velocity 2.61 ft/sec with RW and B AFOs    Curb 5: Supervision    Curb Details (indicate cue type and reason) Had pt perform again during session today with RW and use of hand orthosis to allow for improved lifting of RW.  He did better today and remained closer to RW when lifting/lowering.  Did provide min cues for stepping through esp when ascending curb to allow for improved balance.      Self-Care   Self-Care Other Self-Care Comments    Other Self-Care Comments  Discussed taking break from PT at this time and allow him to finish IVIG treatments and get diabetic shoes.  Feel that  this is good time for DC as he is very resistant to implement education given during PT at home, therefore progress has been very limited. He also reports often times that recommendations are a "hassel" and therefore doesn't do them at home.                    PT Short Term Goals - 05/03/20 1208      PT SHORT TERM GOAL #1   Title Pt will obtain B AFOs in order to to decrease fall risk and improve efficiency of gait. ALL STGS DUE 04/18/20    Baseline received 05/03/20    Time 3    Period Weeks    Status Achieved    Target Date 04/18/20      PT SHORT TERM GOAL #2   Title Pt will be able to stand statically without support x10  secs at RW with min guard in order to indicate improved balance and strength.    Baseline unable  to let go of RW today 05/03/20    Time 3    Period Weeks    Status Not Met      PT SHORT TERM GOAL #3   Title Pt will improve gait speed to >/= 2.16 ft/sec w/ LRAD and B AFO as needed to indicate dec fall risk.    Baseline 2.44 ft/sec with B AFOs and RW    Time 3    Period Weeks    Status Achieved      PT SHORT TERM GOAL #4   Title Pt will improve 5TSS to </=18 secs with BUE support in order to indicate improved strength.    Baseline 26.10 secs with BUE support from mat table 05/03/20    Time 3    Period Weeks    Status Not Met      PT SHORT TERM GOAL #5   Title Pt will decr TUG with B AFOs and RW to 17 seconds or less in order to demo decr fall risk.    Baseline 28.88 secs with B AFOs and RW    Time 3    Period Weeks    Status Not Met             PT Long Term Goals - 05/26/20 1105      PT LONG TERM GOAL #1   Title Pt will be IND with final HEP in order to indicate improved functional mobility and dec fall risk.  ALL LTGS DUE 05/28/20 (updated due to missing 3 weeks)    Baseline met per pt report    Time 6    Period Weeks    Status Achieved      PT LONG TERM GOAL #2   Title Pt will improve gait speed to >/=2.62 ft/sec with LRAD and B AFOs as needed in order to indicate safe community ambulation.    Baseline 2.61 ft/sec with RW (hand orthosis) and B AFOs    Time 6    Period Weeks    Status Achieved      PT LONG TERM GOAL #3   Title Pt will improve 5TSS to </=16 secs with single UE support only in order to indicate improved functional strength.    Baseline 27.53 secs with BUE support on arm chair, 22.40 secs from slightly elevated mat but still with BUE support    Time 6    Period Weeks    Status Not Met      PT LONG  TERM GOAL #4   Title Pt will ambulate x 500' over unlevel paved surfaces w/ LRAD and B AFOs as needed at mod I level in order to indicate safe community mobility.    Baseline Pt met today x 1000' with RW, hand orthosis and B AFOs.    Time 6    Period  Weeks    Status Achieved      PT LONG TERM GOAL #5   Title Pt will perform dynamic standing balance with intermittent UE support x 3 mins in order to perform ADLs safely at home.    Baseline Able to stand dynamically x 3-4 mins with mostly only single UE support, but does still need BUE support at times for brief periods.    Time 6    Period Weeks    Status Partially Met             PHYSICAL THERAPY DISCHARGE SUMMARY  Visits from Start of Care: 20  Current functional level related to goals / functional outcomes: See LTGs above   Remaining deficits: Pt continues to have significant weakness in BLEs and balance issues because of this, however he has made limited progress due to being resistant to PT's recommendations.     Education / Equipment: HEP, B AFOs  Plan: Patient agrees to discharge.  Patient goals were partially met. Patient is being discharged due to lack of progress.  ?????          Plan - 05/26/20 1305    Clinical Impression Statement Skilled session focused on assessment of LTGs.  Pt has met 3/5 LTGs, partially meeting goal for standing balance and not meeting goal for 5TSS due to continued weakness.  Will D/C at this time to allow pt to finish IVIG and get diabetic shoes.  Also would like for him to work on mobility at home as he has been resistant to recommendations made by PT ans so progress has been very limited.    Personal Factors and Comorbidities Comorbidity 3+;Time since onset of injury/illness/exacerbation    Comorbidities see above    Examination-Activity Limitations Bathing;Dressing;Locomotion Level;Transfers;Squat;Stairs;Stand    Examination-Participation Restrictions Community Activity;Driving;School;Yard Work   Occupational hygienist Evolving/Moderate complexity    Rehab Potential Good    PT Frequency 2x / week    PT Duration 6 weeks    PT Treatment/Interventions ADLs/Self Care Home Management;DME Instruction;Gait  training;Stair training;Functional mobility training;Therapeutic activities;Therapeutic exercise;Balance training;Neuromuscular re-education;Patient/family education;Orthotic Fit/Training;Passive range of motion;Vestibular    PT Next Visit Plan LTGs and DC    Consulted and Agree with Plan of Care Patient    Family Member Consulted grandmother           Patient will benefit from skilled therapeutic intervention in order to improve the following deficits and impairments:  Abnormal gait,Decreased activity tolerance,Decreased balance,Decreased endurance,Decreased knowledge of precautions,Decreased knowledge of use of DME,Decreased mobility,Decreased safety awareness,Decreased strength,Difficulty walking,Impaired perceived functional ability,Impaired sensation,Impaired UE functional use,Postural dysfunction  Visit Diagnosis: Muscle weakness (generalized)  Other disturbances of skin sensation  Unsteadiness on feet  Other abnormalities of gait and mobility  Foot drop, left  Foot drop, right     Problem List Patient Active Problem List   Diagnosis Date Noted  . CIDP (chronic inflammatory demyelinating polyneuropathy) ([place]) 05/25/2020  . Noncompliance with medication treatment due to difficulty with dosing 05/25/2020  . Elevated blood pressure reading 05/25/2020  . Mixed hyperlipidemia 05/25/2020  . Morbid obesity ([place]) 05/25/2020  . Depression 05/25/2020  . 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 DKA, type 1 ([place]) 10/23/2019  . DKA (diabetic ketoacidosis) ([place]) 10/21/2019  . COVID-19 virus infection 10/21/2019  . Diabetic polyneuropathy associated with type 1 diabetes mellitus ([place]) 02/26/2019  . Leg weakness, bilateral 02/25/2019  . Postprandial bloating 02/25/2019  . Sensory problems with limbs 02/25/2019  . Ketonuria 11/04/2018  . Diabetes ([place]) 04/23/2018  . Uncontrolled type 1 diabetes mellitus with hyperglycemia ([place]) 05/24/2017   . Morbid childhood obesity with BMI greater than 99th percentile for age [facility]) 05/24/2017  . Insulin resistance 05/24/2017  . Acanthosis 05/24/2017    Denice Bors 05/26/2020, 1:18 PM  [facility] [address] [place], [place], [address] Phone: [phone]   Fax:  [phone]  Name: Ephraim Reichel MRN: [id number] Date of Birth: November 07, 2000

## 2020-05-26 NOTE — Telephone Encounter (Signed)
Dr. Ladona Ridgel sent [facility] referral yesterday.  Received information from Maralyn Sago T to start filling out for possible home care needs.   Patient has an appointment with Dr. Huntley Dec on 07/14/2020.

## 2020-05-26 NOTE — Telephone Encounter (Signed)
Who's calling (name and relationship to patient) : Lucas Cunningham  Best contact number: [phone]  Provider they see: Dr. Quincy Sheehan  Reason for call: Caller tamara with [facility] states she has urgent results [id number] - lab reference number  Call ID:  [id number]   PRESCRIPTION REFILL ONLY  Name of prescription:  Pharmacy:

## 2020-05-26 NOTE — Telephone Encounter (Signed)
Called [facility] back, glucose from lab draw yesterday was 410.   She stated they also faxed the results, I let her know we received them this am.  Fax was given to provider this am

## 2020-05-27 NOTE — Telephone Encounter (Signed)
Completed [facility] home [facility] care request sheet and faxed.  Called the disability assistance program, left voicemail with [place], [place] assistant for return phone call.

## 2020-05-28 LAB — BASIC METABOLIC PANEL
BUN/Creatinine Ratio: 19 (calc) (ref 6–22)
BUN: 9 mg/dL (ref 7–20)
CO2: 22 mmol/L (ref 20–32)
Calcium: 9.9 mg/dL (ref 8.9–10.4)
Chloride: 96 mmol/L — ABNORMAL LOW (ref 98–110)
Creat: 0.47 mg/dL — ABNORMAL LOW (ref 0.60–1.26)
Glucose, Bld: 410 mg/dL — ABNORMAL HIGH (ref 65–99)
Potassium: 3.9 mmol/L (ref 3.8–5.1)
Sodium: 132 mmol/L — ABNORMAL LOW (ref 135–146)

## 2020-05-28 LAB — CYSTATIN C WITH GLOMERULAR FILTRATION RATE, ESTIMATED (EGFR)
CYSTATIN C: 0.51 mg/L — ABNORMAL LOW (ref 0.52–1.35)
eGFR: 154 mL/min/{1.73_m2} (ref >=60)

## 2020-05-28 LAB — VITAMIN D 25 HYDROXY (VIT D DEFICIENCY, FRACTURES): Vit D, 25-Hydroxy: 32 ng/mL (ref 30–100)

## 2020-05-29 NOTE — Progress Notes (Deleted)
Endocrinology provider: Dr. Quincy Sheehan (upcoming appt 04/21/2020 10:30 am)  Dietitian: [name], RD (no upcoming appt) -Previous appts on 06/10/18, 02/25/19, and 12/22/2019  Behavioral health specialist: Dr. Huntley Dec (upcoming appt 04/21/2020 11:30 am) -No prior appt  Patient referred by Dr. Fransico Michael for diabetes education. PMH is significant for T1DM with severe insulin resistance, acanthosis, diabetic polyneuropathy, morbid obesity, prior hx of COVID-19 infection 11-01-19),  Depression, and chronic inflammatory demyelinating neuropathy. Patient was lost to follow up from 02/25/19 to 12/03/19. At prior appt with Dr. Fransico Michael on 12/03/19, multiple issues were discussed. Patient states Spring/Summer 2021 he lost focus on his DM management due to how his mother passed away (now living with his grandmother). He has been guessing at his Novolog doses (not following the sliding scale plan provided by Dr Vanessa Jasper on 11/04/18). He has SEVERE neuropathy; he can't stand up from a sitting position without using his arms for support and he also experiences numbness and loss of strength in the palm of his right hand. He was previously followed by Dr. Devonne Doughty, but has "aged out" of that practice. He was a no show for two NP appointments at [facility], once in October 01, 2022 when his mother died and once in 01-Nov-2022 when hew was admitted with DKA. Dr. Fransico Michael asked the grandmother to call [facility] and obtain a new appointment for him. Patient did mention though at prior appt with Dr Fransico Michael that he wants help in managing his DM. Dr. Fransico Michael advised patient to continue Tresiba 100 units daily (200 units/mL pen) and to re-start Novolog sliding scale + fixed dose of Novolog 10 units three times daily prior to meals. Dr. Fransico Michael would like to transition patient to a 2 component plan (carb + correction factor) for rapid acting insulin in the future.  At prior appt with myself on 03/04/20, patient changed from office  visit to virtual visit due to feeling sick. He stated he had a runny nose and sore throat. He denied abdominal pain, [place] respirations, fruity breath. He last checked his ketones on 03/02/20, which were trace. He was unable leave house right now to get COVID-19 tested due to ice on his porch. He was having issues with adherence with [place] and Novolog. He had was takiing TDD 130 units. BG were 97% very high, 3% high per Dexcom Clarity report. Increased TDD 130 --> 156 (20% increase); new basal dose was [place] U200 66 units daily and new bolus dose will be Humalog U200 30 units three times daily (10:30 AM, 3:30 PM, 6:30 PM). Continued metformin 1 g BID. Assisted patient download realarm app onto his computer to assist with adherence. Also discussed plan with grandmother who wrote instructions down to also assist with adherence. Emailed patient information about COVID-19 testing sites for when weather improved as well as at home COVID-19 testing incase weather did not improve.  At prior appt with Dr. Quincy Sheehan on 05/25/20, patient forgot his Dexcom receiver. His A1c was 13.9 (05/25/20). Dr. Quincy Sheehan decided to add GLP-1 agonist (originally Ozempic, but discussed with Dr. Quincy Sheehan and will rather do Trulicity due to insurance and autoinjector). She also discussed and came to the decision with Saurabh that home health is necessary.   ***  School: not in school right now; planning to re-enrol in [employer] (he is unsure of time of re-enroll)  Insurance Coverage: Managed Medicaid [facility] plan; ID # [id number])  Diabetes Diagnosis: 04/23/18  Family History: T2DM (maternal grandmother, paternal grandfather); T1DM (paternal grandmother); DM (uncle)  Preferred Pharmacy  [facility] [phone] - [place], [place] - [address] AT [facility] [address]  [address] [place] [address]  Phone:  [phone] Fax:  [phone]  DEA #:  [id number]  Medication Adherence -Patient denies  consistent adherence adherence with [name] and Afrezza/Humalog -Current diabetes medications include: Tresiba 66 units daily (200 units/mL pen), HumalogU200 (times/dosing below), metformin XR 500 mg daily after breakfast and 1000 mg daily after dinner i. Humalog ii. 10:30 AM: 30 units iii. 3:30 PM: 30 units iv. 6:30 PM: 30 units -Prior diabetes medications include: Afrezza (more challenging to use considering patient's dexterity)  Injection Sites (*** changes since prior appt on 03/04/2020) -Patient-reports injection sites are right arm, abdomen, right leg --Patient reports independently injecting DM medications. --Patient reports rotating injection sites  Diet (*** changes since prior appt on 03/04/2020) Patient reported dietary habits (gets $90 in food stamps each month) Eats 2 meals/day and 1 snacks/day Breakfast (8:30-9:00 am): 3 eggs with 2 pieces of whole wheat nature's own brand toast, no [place] breast or bologna Lunch ("anytime"; 12-1PM or sometimes ~3-4PM): sandwich (mayonaise, bologna, cheese) Dinner (10-11pm): skips Snacks: no snacks  Drinks: water (24 bottle case in 2-3 days), a little bit of milk  Exercise (*** changes since prior appt on 03/04/2020) Patient-reported exercise habits: walks around house (10 min at a time, 2x daily), sometimes walk around porch; sometimes leg kicks in bed everyday for 5 min, doing exercises recommended by PT (using band, marching) does for 5 minutes each day    Dexcom Clarity Report ***   There were no vitals filed for this visit.  Lab Results  Component Value Date   HGBA1C 13.9 (A) 05/25/2020   HGBA1C >14 01/26/2020   HGBA1C 14.4 (H) 10/21/2019    Lab Results  Component Value Date   CPEPTIDE 3.8 04/23/2018       Assessment:  Patient's goal with DM management: "Get diabetes controlled as independently as possible"  Medication Management/monitoring:  ***  I am concerned for patient as I have followed with him since  October 2021 almost weekly and patient struggles to follow directions even though I provide written handout and/or email him a copy of instructions and he will repeat back instructions to me prior to ending appointment, however, he often forgets what he was instructed to do. He is very adamant about being independent with his care, but I feel like it is challenging for him. I wonder if lack of adherence could possibly be due to underlying psychiatric or neurologic condition.    Plan: 2. Medications:  a. *** Tresiba U200 66 units daily b. *** Humalog U200  i. 30 units three times daily (10:30 am, 3:30 pm, 6:30 pm) c. *** metformin 1000 mg twice daily 3. Monitoring a. Continue Dexcom G6 CGM b. Provided Dexcom sensor/transmitter sample c. Aemon Koeller has a diagnosis of diabetes, checks blood glucose readings > 4x per day, treats with > 3 insulin injections, and requires frequent adjustments to insulin regimen. This patient will be seen every six months, minimally, to assess adherence to their CGM regimen and diabetes treatment plan 4. Follow Up: ***  This appointment required *** minutes of patient care (this includes precharting, chart review, review of results, face to face care, etc.).  Thank you for involving clinical pharmacist/diabetes educator to assist in providing this patient's care.  Zachery Conch, PharmD, CPP, CDCES

## 2020-06-01 ENCOUNTER — Ambulatory Visit: Payer: Medicaid Other | Admitting: Occupational Therapy

## 2020-06-02 ENCOUNTER — Ambulatory Visit: Payer: Medicaid Other | Admitting: Rehabilitation

## 2020-06-03 ENCOUNTER — Ambulatory Visit: Payer: Medicaid Other | Admitting: Occupational Therapy

## 2020-06-03 NOTE — Telephone Encounter (Signed)
Lucas Cunningham  Called back  Patient needs an inpatient stay for partnership with [facility] to be initiated or the patient must be homeless.  The other option is a referral to the [facility] who has a contract as well, she believes the contacts there are Science writer.

## 2020-06-03 NOTE — Telephone Encounter (Signed)
Received fax from [facility], they are unable to process due to his Health Plan. Called [facility], provided name and date of birth.  She stated they will review the chart in Epic and will call back to let us know what they can do.  Called disability assistance program, spoke with a representative who transferred me to the supervisors voicemail, Layla Barter.  Left HIPAA approved voicemail for return phone call.

## 2020-06-03 NOTE — Telephone Encounter (Signed)
[employer] called back, they do offer assistance to educate the primary care giver.  I mentioned we really were looking more for scheduled visits, weekly at a minimum but would like more than that each week to assist with his care.  She is going to reach out to the company again but will also reach out to some of her social worker connections at the hospital.

## 2020-06-04 ENCOUNTER — Other Ambulatory Visit (INDEPENDENT_AMBULATORY_CARE_PROVIDER_SITE_OTHER): Payer: Medicaid Other | Admitting: Pharmacist

## 2020-06-04 DIAGNOSIS — E1065 Type 1 diabetes mellitus with hyperglycemia: Secondary | ICD-10-CM

## 2020-06-04 NOTE — Progress Notes (Deleted)
Endocrinology provider: Dr. Quincy Sheehan (upcoming appt 04/21/2020 10:30 am)  Dietitian: [name], RD (no upcoming appt) -Previous appts on 06/10/18, 02/25/19, and 12/22/2019  Behavioral health specialist: Dr. Huntley Dec (upcoming appt 04/21/2020 11:30 am) -No prior appt  Patient referred by Dr. Fransico Michael for diabetes education. PMH is significant for T1DM with severe insulin resistance, acanthosis, diabetic polyneuropathy, morbid obesity, prior hx of COVID-19 infection 11-01-19),  Depression, and chronic inflammatory demyelinating neuropathy. Patient was lost to follow up from 02/25/19 to 12/03/19. At prior appt with Dr. Fransico Michael on 12/03/19, multiple issues were discussed. Patient states Spring/Summer 2021 he lost focus on his DM management due to how his mother passed away (now living with his grandmother). He has been guessing at his Novolog doses (not following the sliding scale plan provided by Dr Vanessa Jasper on 11/04/18). He has SEVERE neuropathy; he can't stand up from a sitting position without using his arms for support and he also experiences numbness and loss of strength in the palm of his right hand. He was previously followed by Dr. Devonne Doughty, but has "aged out" of that practice. He was a no show for two NP appointments at [facility], once in October 01, 2022 when his mother died and once in 01-Nov-2022 when hew was admitted with DKA. Dr. Fransico Michael asked the grandmother to call [facility] and obtain a new appointment for him. Patient did mention though at prior appt with Dr Fransico Michael that he wants help in managing his DM. Dr. Fransico Michael advised patient to continue Tresiba 100 units daily (200 units/mL pen) and to re-start Novolog sliding scale + fixed dose of Novolog 10 units three times daily prior to meals. Dr. Fransico Michael would like to transition patient to a 2 component plan (carb + correction factor) for rapid acting insulin in the future.  At prior appt with myself on 03/04/20, patient changed from office  visit to virtual visit due to feeling sick. He stated he had a runny nose and sore throat. He denied abdominal pain, [place] respirations, fruity breath. He last checked his ketones on 03/02/20, which were trace. He was unable leave house right now to get COVID-19 tested due to ice on his porch. He was having issues with adherence with [place] and Novolog. He had was takiing TDD 130 units. BG were 97% very high, 3% high per Dexcom Clarity report. Increased TDD 130 --> 156 (20% increase); new basal dose was [place] U200 66 units daily and new bolus dose will be Humalog U200 30 units three times daily (10:30 AM, 3:30 PM, 6:30 PM). Continued metformin 1 g BID. Assisted patient download realarm app onto his computer to assist with adherence. Also discussed plan with grandmother who wrote instructions down to also assist with adherence. Emailed patient information about COVID-19 testing sites for when weather improved as well as at home COVID-19 testing incase weather did not improve.  At prior appt with Dr. Quincy Sheehan on 05/25/20, patient forgot his Dexcom receiver. His A1c was 13.9 (05/25/20). Dr. Quincy Sheehan decided to add GLP-1 agonist (originally Ozempic, but discussed with Dr. Quincy Sheehan and will rather do Trulicity due to insurance and autoinjector). She also discussed and came to the decision with Lucas Cunningham that home health is necessary.   ***  School: not in school right now; planning to re-enrol in [employer] (he is unsure of time of re-enroll)  Insurance Coverage: Managed Medicaid [facility] plan; ID # [id number])  Diabetes Diagnosis: 04/23/18  Family History: T2DM (maternal grandmother, paternal grandfather); T1DM (paternal grandmother); DM (uncle)  Preferred Pharmacy  [id number] - [place], [place] - [address]  [address], [place] [address]  Phone:  [phone] Fax:  [phone]  DEA #:  [id number]  Medication Adherence -Patient denies  consistent adherence adherence with Tresiba and Afrezza/Humalog -Current diabetes medications include: Tresiba 66 units daily (200 units/mL pen), HumalogU200 (times/dosing below), metformin XR 500 mg daily after breakfast and 1000 mg daily after dinner i. Humalog ii. 10:30 AM: 30 units iii. 3:30 PM: 30 units iv. 6:30 PM: 30 units -Prior diabetes medications include: Afrezza (more challenging to use considering patient's dexterity)  Injection Sites (*** changes since prior appt on 03/04/2020) -Patient-reports injection sites are right arm, abdomen, right leg --Patient reports independently injecting DM medications. --Patient reports rotating injection sites  Diet (*** changes since prior appt on 03/04/2020) Patient reported dietary habits (gets $90 in food stamps each month) Eats 2 meals/day and 1 snacks/day Breakfast (8:30-9:00 am): 3 eggs with 2 pieces of whole wheat nature's own brand toast, no turkey breast or bologna Lunch ("anytime"; 12-1PM or sometimes ~3-4PM): sandwich (mayonaise, bologna, cheese) Dinner (10-11pm): skips Snacks: no snacks  Drinks: water (24 bottle case in 2-3 days), a little bit of milk  Exercise (*** changes since prior appt on 03/04/2020) Patient-reported exercise habits: walks around house (10 min at a time, 2x daily), sometimes walk around porch; sometimes leg kicks in bed everyday for 5 min, doing exercises recommended by PT (using band, marching) does for 5 minutes each day    Dexcom Clarity Report ***   There were no vitals filed for this visit.  Lab Results  Component Value Date   HGBA1C 13.9 (A) 05/25/2020   HGBA1C >14 01/26/2020   HGBA1C 14.4 (H) 10/21/2019    Lab Results  Component Value Date   CPEPTIDE 3.8 04/23/2018       Assessment:  Patient's goal with DM management: "Get diabetes controlled as independently as possible"  Medication Management/monitoring:  ***  I am concerned for patient as I have followed with him since  October 2021 almost weekly and patient struggles to follow directions even though I provide written handout and/or email him a copy of instructions and he will repeat back instructions to me prior to ending appointment, however, he often forgets what he was instructed to do. He is very adamant about being independent with his care, but I feel like it is challenging for him. I wonder if lack of adherence could possibly be due to underlying psychiatric or neurologic condition.    Plan: 2. Medications:  a. *** Tresiba U200 66 units daily b. *** Humalog U200  i. 30 units three times daily (10:30 am, 3:30 pm, 6:30 pm) c. *** metformin 1000 mg twice daily 3. Monitoring a. Continue Dexcom G6 CGM b. Provided Dexcom sensor/transmitter sample c. Lucas Cunningham has a diagnosis of diabetes, checks blood glucose readings > 4x per day, treats with > 3 insulin injections, and requires frequent adjustments to insulin regimen. This patient will be seen every six months, minimally, to assess adherence to their CGM regimen and diabetes treatment plan 4. Follow Up: ***  This appointment required *** minutes of patient care (this includes precharting, chart review, review of results, face to face care, etc.).  Thank you for involving clinical pharmacist/diabetes educator to assist in providing this patient's care.  Lucas Cunningham, PharmD, CPP, CDCES  

## 2020-06-04 NOTE — Telephone Encounter (Signed)
Spoke with Dr. Quincy Sheehan and Dr. Ladona Ridgel, Dr. Ladona Ridgel will reach out to the pharmacist at [facility] to see if this is an option for him.

## 2020-06-08 ENCOUNTER — Other Ambulatory Visit: Payer: Self-pay

## 2020-06-08 ENCOUNTER — Ambulatory Visit: Payer: Medicaid Other | Admitting: Occupational Therapy

## 2020-06-08 NOTE — Patient Outreach (Signed)
Medicaid Managed Care Social Work Note  06/08/2020 Name:  Lucas Cunningham MRN:  [id number] DOB:  November 08, 2000  Lucas Cunningham is an 20 y.o. year old male who is Cunningham primary patient of Lucas Cunningham, Lucas Netters, NP.  The [facility] Managed Care Coordination team was consulted for assistance with:  Level of Care Concerns  Lucas Cunningham was given information about Medicaid Managed CareCoordination services today. Lucas Cunningham agreed to services and verbal consent obtained.  Engaged with patient  for by telephone forinitial visit in response to referral for case management and/or care coordination services.   Assessments/Interventions:  Review of past medical history, allergies, medications, health status, including review of consultants reports, laboratory and other test data, was performed as part of comprehensive evaluation and provision of chronic care management services.  SDOH: (Social Determinant of Health) assessments and interventions performed: BSW contacted patient and spoke with Lucas Cunningham. Grandmother stated patient does need assistance with bathing but will not allow anyone to touch him. Grandmother states he has braces on his legs which slows him down. Patient unable to do too much of anything with his hands due to his neuropathy and has Cunningham jug in his room to use the bathroom. Grandmother states assistance with food is needed. She receives foodstamps for herself and has applied for patient, and still waiting for Cunningham response. BSW provided grandmother with food pantires to get them through. BSW contacted [facility] [phone] and placed Cunningham referral for [facility] services for patient. Grandmother would like for [facility] and pharmacist to contact her. BSW will submit the referral.   Advanced Directives Status:  Not addressed in this encounter.  Care Plan                 No Known Allergies  Medications Reviewed Today    Reviewed by Lucas Cunningham, PT (Physical Therapist) on 05/26/20 at 1105   Med List Status: <None>  Medication Order Taking? Sig Documenting Provider Last Dose Status Informant  Accu-Chek FastClix Lancets MISC [id number] No 1 Device by Does not apply route as directed. Use with Fastclix lancet device to check blood sugar up to 8x daily. (diagnosis code E10.65)  Patient not taking: No sig reported   Lucas Hock, MD Not Taking Active   acetone, urine, test strip [id number] No 1 strip by Does not apply route as needed for high blood sugar. Up to 8x daily if necessary  Patient not taking: No sig reported   Lucas Hock, MD Not Taking Active   atorvastatin (LIPITOR) 10 MG tablet [id number] No Take 1 tablet by mouth daily. [provider] Taking Active   Continuous Blood Gluc Receiver (DEXCOM G6 RECEIVER) DEVI [id number] No 1 Device by Does not apply route as directed.  Patient not taking: Reported on 05/25/2020   Lucas Hock, MD Not Taking Active   Continuous Blood Gluc Sensor (DEXCOM G6 SENSOR) MISC [id number] No Inject 1 applicator into the skin as directed. (change sensor every 10 days) Lucas Hock, MD Taking Active   Continuous Blood Gluc Transmit (DEXCOM G6 TRANSMITTER) MISC [id number] No Inject 1 Device into the skin as directed. (re-use up to 8x with each new sensor) Lucas Hock, MD Taking Active   Dulaglutide (TRULICITY) 4.38 OI/7.5ZV SOPN [id number]  Inject 0.75 mg into the skin once Cunningham week. Lucas Corpus, MD  Active   gabapentin (NEURONTIN) 300 MG capsule [id number] No Take 1 capsule (300 mg total) by mouth 2 (two) times daily.  Patient not taking: No sig reported  Lucas Lower, MD Not Taking Active            Med Note Lucas Cunningham   Wed Dec 03, 2019 11:22 AM) Has not started taking  Glucagon (BAQSIMI TWO PACK) 3 MG/DOSE POWD [id number] No Place 1 spray into the nose as directed.  Patient not taking: No sig reported   Lucas Hock, MD Not Taking Active   Glucose Blood (BLOOD GLUCOSE TEST STRIPS) STRP [id number]  No 1 Device by Other route as directed. To check blood sugar up to 8x per day  Patient not taking: Reported on 05/25/2020   Lucas Hock, MD Not Taking Active   insulin degludec (TRESIBA FLEXTOUCH) 200 UNIT/ML FlexTouch Pen [id number]  Inject 100 Units into the skin at bedtime. Lucas Corpus, MD  Active   insulin lispro [facility]) 200 UNIT/ML KwikPen [id number] No Inject up to 100 units daily per provider instructions Lucas Hock, MD Taking Active   Insulin Pen Needle (BD PEN NEEDLE NANO U/F) 32G X 4 MM MISC [id number]  Inject up to 8 times per day. Lucas Corpus, MD  Active   Insulin Regular Human [facility]) 4 & 8 & 12 units POWD [id number] No Inhale up to 96 units daily  Patient not taking: Reported on 05/25/2020   Lucas Hock, MD Not Taking Active   ketoconazole (NIZORAL) 2 % cream [id number] No Apply to fet daily.  Patient not taking: Reported on 05/25/2020   Lucas Hock, MD Not Taking Active   Lancets Misc. [facility] FASTCLIX LANCET) KIT [id number] No 1 Device by Does not apply route as directed. Use with lancets to check blood sugar up to 8x daily.  (diagnosis code E10.65)  Patient not taking: No sig reported   Lucas Hock, MD Not Taking Active   metFORMIN (GLUCOPHAGE) 1000 MG tablet [id number] No Take 1 tablet (1,000 mg total) by mouth 2 (two) times daily with Cunningham meal. Lucas Corpus, MD Taking Active   ondansetron (ZOFRAN ODT) 8 MG disintegrating tablet [id number] No Take 1 tablet (8 mg total) by mouth every 8 (eight) hours as needed for nausea or vomiting.  Patient not taking: No sig reported   Lucas Corpus, MD Not Taking Active   PRESCRIPTION MEDICATION [id number] No Inject into the vein. IVIG every 4 weeks. Order for new start sent to [place] on 04/08/2020. Melvenia Beam, MD Taking Active           Patient Active Problem List   Diagnosis Date Noted  . CIDP (chronic inflammatory demyelinating polyneuropathy) ([place]) 05/25/2020   . Noncompliance with medication treatment due to difficulty with dosing 05/25/2020  . Elevated blood pressure reading 05/25/2020  . Mixed hyperlipidemia 05/25/2020  . Morbid obesity ([place]) 05/25/2020  . Depression 05/25/2020  . 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 DKA, type 1 ([place]) 10/23/2019  . DKA (diabetic ketoacidosis) ([place]) 10/21/2019  . COVID-19 virus infection 10/21/2019  . Diabetic polyneuropathy associated with type 1 diabetes mellitus ([place]) 02/26/2019  . Leg weakness, bilateral 02/25/2019  . Postprandial bloating 02/25/2019  . Sensory problems with limbs 02/25/2019  . Ketonuria 11/04/2018  . Diabetes ([place]) 04/23/2018  . Uncontrolled type 1 diabetes mellitus with hyperglycemia ([place]) 05/24/2017  . Morbid childhood obesity with BMI greater than 99th percentile for age [facility]) 05/24/2017  . Insulin resistance 05/24/2017  . Acanthosis 05/24/2017    Conditions to be addressed/monitored per PCP order:  level of care   There are no  care plans that you recently modified to display for this patient.   Follow up:  Patient agrees to Care Plan and Follow-up.  Plan: The Managed Medicaid care management team will reach out to the patient again over the next 14 days.  Date/time of next scheduled Social Work care management/care coordination outreach:  06/28/20  Mickel Fuchs, [place], [place]  High Risk Managed Medicaid Team

## 2020-06-08 NOTE — Patient Instructions (Signed)
Visit Information  Lucas Cunningham was given information about Medicaid Managed Care team care coordination services as a part of their [facility] Medicaid benefit. Lucas Cunningham verbally consented to engagement with the [facility] Managed Care team.   For questions related to your [facility] health plan, please call: [phone]  If you would like to schedule transportation through your [facility] plan, please call the following number at least 2 days in advance of your appointment: [phone]   Call the [facility] Crisis Line at [phone], at any time, 24 hours a day, 7 days a week. If you are in danger or need immediate medical attention call 911.  Lucas Cunningham - following are the goals we discussed in your visit today:  Goals Addressed   None     Social Worker will follow up in 14 days.   Gus Puma, BSW, [facility]  [place]  High Risk Managed Medicaid Team    Following is a copy of your plan of care:  There are no care plans to display for this patient.

## 2020-06-09 ENCOUNTER — Other Ambulatory Visit (INDEPENDENT_AMBULATORY_CARE_PROVIDER_SITE_OTHER): Payer: Medicaid Other | Admitting: Pharmacist

## 2020-06-10 ENCOUNTER — Ambulatory Visit: Payer: Medicaid Other | Admitting: Occupational Therapy

## 2020-06-11 ENCOUNTER — Other Ambulatory Visit: Payer: Self-pay

## 2020-06-11 ENCOUNTER — Other Ambulatory Visit: Payer: Self-pay | Admitting: *Deleted

## 2020-06-11 NOTE — Patient Outreach (Signed)
Medicaid Managed Care Social Work Note  06/11/2020 Name:  Lucas Cunningham MRN:  [id number] DOB:  2000/07/04  Lucas Cunningham is an 20 y.o. year old male who is a primary patient of Dante Gang, Tor Netters, NP.  The Medicaid Managed Care Coordination team was consulted for assistance with:  home health services  [name] was given information about Medicaid Managed CareCoordination services today. [name] agreed to services and verbal consent obtained.  Engaged with patient  for by telephone forinitial visit in response to referral for case management and/or care coordination services.   Assessments/Interventions:  Review of past medical history, allergies, medications, health status, including review of consultants reports, laboratory and other test data, was performed as part of comprehensive evaluation and provision of chronic care management services.  SDOH: (Social Determinant of Health) assessments and interventions performed:  BSW contacted patient and spoke with grandmother. She stated that due to patients neuropathy it makes it difficult for him to do a lot of things on his own. Grandmother states he does need assistance with bathing, with the braces on his legs it makes it difficult for him. Patient keeps a jug in his room for toileting. Grandmother states she does the best she can, but assistance is needed. BSW contacted [facility] and spoke with Jenny Reichmann, a referral for [facility] was submitted and [facility] will reach out to patient to complete an assessment.  Grandmother stated she has applied for disability for patient but has not received an answer yet, grandmother is the only income in the household and food is getting hard to keep in the home. Grandmother states she has completed an application for foodstamps for patient and is waiting for a response. BSW provided grandmother with food pantry resources.  BSW will follow up in 14 days.   Advanced Directives Status:  Not addressed in  this encounter.  Care Plan                 No Known Allergies  Medications Reviewed Today    Reviewed by Berniece Andreas, PT (Physical Therapist) on 05/26/20 at 1105  Med List Status: <None>  Medication Order Taking? Sig Documenting Provider Last Dose Status Informant  Accu-Chek FastClix Lancets MISC [id number] No 1 Device by Does not apply route as directed. Use with Fastclix lancet device to check blood sugar up to 8x daily. (diagnosis code E10.65)  Patient not taking: No sig reported   Sherrlyn Hock, MD Not Taking Active   acetone, urine, test strip [id number] No 1 strip by Does not apply route as needed for high blood sugar. Up to 8x daily if necessary  Patient not taking: No sig reported   Sherrlyn Hock, MD Not Taking Active   atorvastatin (LIPITOR) 10 MG tablet [id number] No Take 1 tablet by mouth daily. [provider] Taking Active   Continuous Blood Gluc Receiver (DEXCOM G6 RECEIVER) DEVI [id number] No 1 Device by Does not apply route as directed.  Patient not taking: Reported on 05/25/2020   Sherrlyn Hock, MD Not Taking Active   Continuous Blood Gluc Sensor (DEXCOM G6 SENSOR) MISC [id number] No Inject 1 applicator into the skin as directed. (change sensor every 10 days) Sherrlyn Hock, MD Taking Active   Continuous Blood Gluc Transmit (DEXCOM G6 TRANSMITTER) MISC [id number] No Inject 1 Device into the skin as directed. (re-use up to 8x with each new sensor) Sherrlyn Hock, MD Taking Active   Dulaglutide (TRULICITY) 7.35 HG/9.9ME SOPN [id number]  Inject 0.75 mg into  the skin once a week. Al Corpus, MD  Active   gabapentin (NEURONTIN) 300 MG capsule [id number] No Take 1 capsule (300 mg total) by mouth 2 (two) times daily.  Patient not taking: No sig reported   Teressa Lower, MD Not Taking Active            Med Note Zachary George, KELLY A   Wed Dec 03, 2019 11:22 AM) Has not started taking  Glucagon (BAQSIMI TWO PACK) 3 MG/DOSE POWD [id number] No Place 1  spray into the nose as directed.  Patient not taking: No sig reported   Sherrlyn Hock, MD Not Taking Active   Glucose Blood (BLOOD GLUCOSE TEST STRIPS) STRP [id number] No 1 Device by Other route as directed. To check blood sugar up to 8x per day  Patient not taking: Reported on 05/25/2020   Sherrlyn Hock, MD Not Taking Active   insulin degludec (TRESIBA FLEXTOUCH) 200 UNIT/ML FlexTouch Pen [id number]  Inject 100 Units into the skin at bedtime. Al Corpus, MD  Active   insulin lispro [facility]) 200 UNIT/ML KwikPen [id number] No Inject up to 100 units daily per provider instructions Sherrlyn Hock, MD Taking Active   Insulin Pen Needle (BD PEN NEEDLE NANO U/F) 32G X 4 MM MISC [id number]  Inject up to 8 times per day. Al Corpus, MD  Active   Insulin Regular Human [facility]) 4 & 8 & 12 units POWD [id number] No Inhale up to 96 units daily  Patient not taking: Reported on 05/25/2020   Sherrlyn Hock, MD Not Taking Active   ketoconazole (NIZORAL) 2 % cream [id number] No Apply to fet daily.  Patient not taking: Reported on 05/25/2020   Sherrlyn Hock, MD Not Taking Active   Lancets Misc. [facility] FASTCLIX LANCET) KIT [id number] No 1 Device by Does not apply route as directed. Use with lancets to check blood sugar up to 8x daily.  (diagnosis code E10.65)  Patient not taking: No sig reported   Sherrlyn Hock, MD Not Taking Active   metFORMIN (GLUCOPHAGE) 1000 MG tablet [id number] No Take 1 tablet (1,000 mg total) by mouth 2 (two) times daily with a meal. Al Corpus, MD Taking Active   ondansetron (ZOFRAN ODT) 8 MG disintegrating tablet [id number] No Take 1 tablet (8 mg total) by mouth every 8 (eight) hours as needed for nausea or vomiting.  Patient not taking: No sig reported   Al Corpus, MD Not Taking Active   PRESCRIPTION MEDICATION [id number] No Inject into the vein. IVIG every 4 weeks. Order for new start sent to [place] on 04/08/2020. Melvenia Beam, MD Taking Active           Patient Active Problem List   Diagnosis Date Noted  . CIDP (chronic inflammatory demyelinating polyneuropathy) ([place]) 05/25/2020  . Noncompliance with medication treatment due to difficulty with dosing 05/25/2020  . Elevated blood pressure reading 05/25/2020  . Mixed hyperlipidemia 05/25/2020  . Morbid obesity ([place]) 05/25/2020  . Depression 05/25/2020  . 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 DKA, type 1 ([place]) 10/23/2019  . DKA (diabetic ketoacidosis) ([place]) 10/21/2019  . COVID-19 virus infection 10/21/2019  . Diabetic polyneuropathy associated with type 1 diabetes mellitus ([place]) 02/26/2019  . Leg weakness, bilateral 02/25/2019  . Postprandial bloating 02/25/2019  . Sensory problems with limbs 02/25/2019  . Ketonuria 11/04/2018  . Diabetes ([place]) 04/23/2018  . Uncontrolled type 1 diabetes mellitus with hyperglycemia ([place]) 05/24/2017  . Morbid  childhood obesity with BMI greater than 99th percentile for age [facility]) 05/24/2017  . Insulin resistance 05/24/2017  . Acanthosis 05/24/2017    Conditions to be addressed/monitored per PCP order:  hh  Care Plan : General Social Work (Adult)  Updates made by Ethelda Chick since 06/11/2020 12:00 AM    Problem: HH Services   Onset Date: 06/09/2020      Follow up:  Patient agrees to Care Plan and Follow-up.  Plan: The Managed Medicaid care management team will reach out to the patient again over the next 14 days.  Date/time of next scheduled Social Work care management/care coordination outreach:  06/28/20  Mickel Fuchs, [place], [place]  High Risk Managed Medicaid Team

## 2020-06-11 NOTE — Patient Instructions (Signed)
Visit Information  [name] was given information about Medicaid Managed Care team care coordination services as a part of their [facility] Medicaid benefit. Virgle Arth verbally consented to engagement with the [facility] Managed Care team.   For questions related to your [facility] health plan, please call: [phone]  If you would like to schedule transportation through your [facility] plan, please call the following number at least 2 days in advance of your appointment: [phone]   Call the [facility] Crisis Line at [phone], at any time, 24 hours a day, 7 days a week. If you are in danger or need immediate medical attention call 911.  [name] - following are the goals we discussed in your visit today:  Goals Addressed            This Visit's Progress   . Protect My Health       Timeframe:  Long-Range Goal Priority:  High Start Date:  06/11/20                           Expected End Date: 09/10/20                      Follow Up Date  06/28/20   - schedule and keep appointment for annual check-up  - work with [name], BSW for assistance with arranging [facility] for healthcare management   Why is this important?    Screening tests can find diseases early when they are easier to treat.   Your doctor or nurse will talk with you about which tests are important for you.   Getting shots for common diseases like the flu and shingles will help prevent them.            Please see education materials related to diabetes provided as print materials.     Diabetes Mellitus Basics  Diabetes mellitus, or diabetes, is a long-term (chronic) disease. It occurs when the body does not properly use sugar (glucose) that is released from food after you eat. Diabetes mellitus may be caused by one or both of these problems:  Your pancreas does not make enough of a hormone called insulin.  Your body does not react in a normal way to the insulin that it  makes. Insulin lets glucose enter cells in your body. This gives you energy. If you have diabetes, glucose cannot get into cells. This causes high blood glucose (hyperglycemia). How to treat and manage diabetes You may need to take insulin or other diabetes medicines daily to keep your glucose in balance. If you are prescribed insulin, you will learn how to give yourself insulin by injection. You may need to adjust the amount of insulin you take based on the foods that you eat. You will need to check your blood glucose levels using a glucose monitor as told by your health care provider. The readings can help determine if you have low or high blood glucose. Generally, you should have these blood glucose levels:  Before meals (preprandial): 80-130 mg/dL (8.1-0.1 mmol/L).  After meals (postprandial): below 180 mg/dL (10 mmol/L).  Hemoglobin A1c (HbA1c) level: less than 7%. Your health care provider will set treatment goals for you. Keep all follow-up visits. This is important. Follow these instructions at home: Diabetes medicines Take your diabetes medicines every day as told by your health care provider. List your diabetes medicines here:  Name of medicine: ______________________________ ? Amount (dose): _______________ Time (a.m./p.m.):  _______________ Notes: ___________________________________  Name of medicine: ______________________________ ? Amount (dose): _______________ Time (a.m./p.m.): _______________ Notes: ___________________________________  Name of medicine: ______________________________ ? Amount (dose): _______________ Time (a.m./p.m.): _______________ Notes: ___________________________________ Insulin If you use insulin, list the types of insulin you use here:  Insulin type: ______________________________ ? Amount (dose): _______________ Time (a.m./p.m.): _______________Notes: ___________________________________  Insulin type: ______________________________ ? Amount  (dose): _______________ Time (a.m./p.m.): _______________ Notes: ___________________________________  Insulin type: ______________________________ ? Amount (dose): _______________ Time (a.m./p.m.): _______________ Notes: ___________________________________  Insulin type: ______________________________ ? Amount (dose): _______________ Time (a.m./p.m.): _______________ Notes: ___________________________________  Insulin type: ______________________________ ? Amount (dose): _______________ Time (a.m./p.m.): _______________ Notes: ___________________________________ Managing blood glucose Check your blood glucose levels using a glucose monitor as told by your health care provider. Write down the times that you check your glucose levels here:  Time: _______________ Notes: ___________________________________  Time: _______________ Notes: ___________________________________  Time: _______________ Notes: ___________________________________  Time: _______________ Notes: ___________________________________  Time: _______________ Notes: ___________________________________  Time: _______________ Notes: ___________________________________   Low blood glucose Low blood glucose (hypoglycemia) is when glucose is at or below 70 mg/dL (3.9 mmol/L). Symptoms may include:  Feeling: ? Hungry. ? Sweaty and clammy. ? Irritable or easily upset. ? Dizzy. ? Sleepy.  Having: ? A fast heartbeat. ? A headache. ? A change in your vision. ? Numbness around the mouth, lips, or tongue.  Having trouble with: ? Moving (coordination). ? Sleeping. Treating low blood glucose To treat low blood glucose, eat or drink something containing sugar right away. If you can think clearly and swallow safely, follow the 15:15 rule:  Take 15 grams of a fast-acting carb (carbohydrate), as told by your health care provider.  Some fast-acting carbs are: ? Glucose tablets: take 3-4 tablets. ? Hard candy: eat 3-5  pieces. ? Fruit juice: drink 4 oz (120 mL). ? Regular (not diet) soda: drink 4-6 oz (120-180 mL). ? Honey or sugar: eat 1 Tbsp (15 mL).  Check your blood glucose levels 15 minutes after you take the carb.  If your glucose is still at or below 70 mg/dL (3.9 mmol/L), take 15 grams of a carb again.  If your glucose does not go above 70 mg/dL (3.9 mmol/L) after 3 tries, get help right away.  After your glucose goes back to normal, eat a meal or a snack within 1 hour. Treating very low blood glucose If your glucose is at or below 54 mg/dL (3 mmol/L), you have very low blood glucose (severe hypoglycemia). This is an emergency. Do not wait to see if the symptoms will go away. Get medical help right away. Call your local emergency services (911 in the [place] Do not drive yourself to the hospital. Questions to ask your health care provider  Should I talk with a diabetes educator?  What equipment will I need to care for myself at home?  What diabetes medicines do I need? When should I take them?  How often do I need to check my blood glucose levels?  What number can I call if I have questions?  When is my follow-up visit?  Where can I find a support group for people with diabetes? Where to find more information  American Diabetes Association: www.diabetes.org  Association of Diabetes Care and Education Specialists: www.diabeteseducator.org Contact a health care provider if:  Your blood glucose is at or above 240 mg/dL (72.5 mmol/L) for 2 days in a row.  You have been sick or have had a fever for 2 days or more, and you are  not getting better.  You have any of these problems for more than 6 hours: ? You cannot eat or drink. ? You feel nauseous. ? You vomit. ? You have diarrhea. Get help right away if:  Your blood glucose is lower than 54 mg/dL (3 mmol/L).  You get confused.  You have trouble thinking clearly.  You have trouble breathing. These symptoms may represent a  serious problem that is an emergency. Do not wait to see if the symptoms will go away. Get medical help right away. Call your local emergency services (911 in the [place] Do not drive yourself to the hospital. Summary  Diabetes mellitus is a chronic disease that occurs when the body does not properly use sugar (glucose) that is released from food after you eat.  Take insulin and diabetes medicines as told.  Check your blood glucose every day, as often as told.  Keep all follow-up visits. This is important. This information is not intended to replace advice given to you by your health care provider. Make sure you discuss any questions you have with your health care provider. Document Revised: 06/03/2019 Document Reviewed: 06/03/2019 Elsevier Patient Education  2021 Elsevier Inc.   The Managed Medicaid care management team will reach out to the patient again over the next 7-14 days.   [name] RN, BSN [place]  [facility] RN Care Coordinator   Following is a copy of your plan of care:  Patient Care Plan: General Plan of Care (Adult)    Problem Identified: Interdisciplinary Collaborative/Care Coordination   Priority: High  Onset Date: 06/11/2020    Long-Range Goal: Patient-Specific Goal   Start Date: 06/11/2020  Expected End Date: 09/10/2020  This Visit's Progress: On track  Priority: High  Note:   Current Barriers:  . Care Management support, education, and care coordination needs related to DM Case Manager Clinical Goal(s):  . patient will work with BSW to address needs related to Level of care concerns and ADL IADL limitations in patient with DM Interventions:  . Collaborated with BSW to initiate plan of care to address needs related to Level of care concerns and ADL IADL limitations in patient with DM Self Care Activities:  . Patient will attend all scheduled provider appointments . Patient will work with BSW to address care coordination needs and will  continue to work with the clinical team to address health care and disease management related needs.   Patient Goals: - schedule and keep appointment for annual check-up  - work with [name], BSW for assistance with arranging [facility] for healthcare management Follow up plan:  The Managed Medicaid care management team will reach out to the patient again over the next 14 days.

## 2020-06-11 NOTE — Patient Outreach (Signed)
Medicaid Managed Care   Nurse Care Manager Note  06/11/2020 Name:  Lucas Cunningham MRN:  [id number] DOB:  Sep 04, 2000  Lucas Cunningham is an 20 y.o. year old male who is Cunningham primary patient of Lucas Cunningham, Lucas Netters, NP.  The [facility] Managed Care Coordination team was consulted for assistance with:    [name] and care coordination  [name] was given information about Medicaid Managed Care Coordination team services today. [name] agreed to services and verbal consent obtained.  Engaged with patient Other:unable to reach [name] today, RNCM performed Cunningham chart review and collaborated with MM BSW for interdisciplinary care coordination for health management in response to provider referral for case management and/or care coordination services.   Assessments/Interventions:  Review of past medical history, allergies, medications, health status, including review of consultants reports, laboratory and other test data, was performed as part of comprehensive evaluation and provision of chronic care management services.  SDOH (Social Determinants of Health) assessments and interventions performed:   Care Plan  No Known Allergies  Medications Reviewed Today    Reviewed by Lucas Cunningham, PT (Physical Therapist) on 05/26/20 at 1105  Med List Status: <None>  Medication Order Taking? Sig Documenting Provider Last Dose Status Informant  Accu-Chek FastClix Lancets MISC [id number] No 1 Device by Does not apply route as directed. Use with Fastclix lancet device to check blood sugar up to 8x daily. (diagnosis code E10.65)  Patient not taking: No sig reported   Lucas Hock, MD Not Taking Active   acetone, urine, test strip [id number] No 1 strip by Does not apply route as needed for high blood sugar. Up to 8x daily if necessary  Patient not taking: No sig reported   Lucas Hock, MD Not Taking Active   atorvastatin (LIPITOR) 10 MG tablet [id number] No Take 1 tablet by mouth daily.  [provider] Taking Active   Continuous Blood Gluc Receiver (DEXCOM G6 RECEIVER) DEVI [id number] No 1 Device by Does not apply route as directed.  Patient not taking: Reported on 05/25/2020   Lucas Hock, MD Not Taking Active   Continuous Blood Gluc Sensor (DEXCOM G6 SENSOR) MISC [id number] No Inject 1 applicator into the skin as directed. (change sensor every 10 days) Lucas Hock, MD Taking Active   Continuous Blood Gluc Transmit (DEXCOM G6 TRANSMITTER) MISC [id number] No Inject 1 Device into the skin as directed. (re-use up to 8x with each new sensor) Lucas Hock, MD Taking Active   Dulaglutide (TRULICITY) 1.51 VO/1.6WV SOPN [id number]  Inject 0.75 mg into the skin once Cunningham week. Lucas Corpus, MD  Active   gabapentin (NEURONTIN) 300 MG capsule [id number] No Take 1 capsule (300 mg total) by mouth 2 (two) times daily.  Patient not taking: No sig reported   Lucas Lower, MD Not Taking Active            Med Note Lucas Cunningham, Lucas Cunningham   Wed Dec 03, 2019 11:22 AM) Has not started taking  Glucagon (BAQSIMI TWO PACK) 3 MG/DOSE POWD [id number] No Place 1 spray into the nose as directed.  Patient not taking: No sig reported   Lucas Hock, MD Not Taking Active   Glucose Blood (BLOOD GLUCOSE TEST STRIPS) STRP [id number] No 1 Device by Other route as directed. To check blood sugar up to 8x per day  Patient not taking: Reported on 05/25/2020   Lucas Hock, MD Not Taking Active   insulin degludec (TRESIBA FLEXTOUCH) 200 UNIT/ML FlexTouch Pen  [id number]  Inject 100 Units into the skin at bedtime. Lucas Corpus, MD  Active   insulin lispro [facility]) 200 UNIT/ML KwikPen [id number] No Inject up to 100 units daily per provider instructions Lucas Hock, MD Taking Active   Insulin Pen Needle (BD PEN NEEDLE NANO U/F) 32G X 4 MM MISC [id number]  Inject up to 8 times per day. Lucas Corpus, MD  Active   Insulin Regular Human [facility]) 4 & 8 & 12 units POWD  [id number] No Inhale up to 96 units daily  Patient not taking: Reported on 05/25/2020   Lucas Hock, MD Not Taking Active   ketoconazole (NIZORAL) 2 % cream [id number] No Apply to fet daily.  Patient not taking: Reported on 05/25/2020   Lucas Hock, MD Not Taking Active   Lancets Misc. [facility] FASTCLIX LANCET) KIT [id number] No 1 Device by Does not apply route as directed. Use with lancets to check blood sugar up to 8x daily.  (diagnosis code E10.65)  Patient not taking: No sig reported   Lucas Hock, MD Not Taking Active   metFORMIN (GLUCOPHAGE) 1000 MG tablet [id number] No Take 1 tablet (1,000 mg total) by mouth 2 (two) times daily with Cunningham meal. Lucas Corpus, MD Taking Active   ondansetron (ZOFRAN ODT) 8 MG disintegrating tablet [id number] No Take 1 tablet (8 mg total) by mouth every 8 (eight) hours as needed for nausea or vomiting.  Patient not taking: No sig reported   Lucas Corpus, MD Not Taking Active   PRESCRIPTION MEDICATION [id number] No Inject into the vein. IVIG every 4 weeks. Order for new start sent to [facility] on 04/08/2020. Lucas Beam, MD Taking Active           Patient Active Problem List   Diagnosis Date Noted  . CIDP (chronic inflammatory demyelinating polyneuropathy) ([place]) 05/25/2020  . Noncompliance with medication treatment due to difficulty with dosing 05/25/2020  . Elevated blood pressure reading 05/25/2020  . Mixed hyperlipidemia 05/25/2020  . Morbid obesity ([place]) 05/25/2020  . Depression 05/25/2020  . 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 DKA, type 1 ([place]) 10/23/2019  . DKA (diabetic ketoacidosis) ([place]) 10/21/2019  . COVID-19 virus infection 10/21/2019  . Diabetic polyneuropathy associated with type 1 diabetes mellitus ([place]) 02/26/2019  . Leg weakness, bilateral 02/25/2019  . Postprandial bloating 02/25/2019  . Sensory problems with limbs 02/25/2019  . Ketonuria 11/04/2018   . Diabetes ([place]) 04/23/2018  . Uncontrolled type 1 diabetes mellitus with hyperglycemia ([place]) 05/24/2017  . Morbid childhood obesity with BMI greater than 99th percentile for age [facility]) 05/24/2017  . Insulin resistance 05/24/2017  . Acanthosis 05/24/2017    Conditions to be addressed/monitored per PCP order:  DM   Care Plan : General Plan of Care (Adult)  Updates made by Melissa Montane, RN since 06/11/2020 12:00 AM    Problem: Interdisciplinary Collaborative/Care Coordination   Priority: High  Onset Date: 06/11/2020    Long-Range Goal: Patient-Specific Goal   Start Date: 06/11/2020  Expected End Date: 09/10/2020  This Visit's Progress: On track  Priority: High  Note:   Current Barriers:  . Care Management support, education, and care coordination needs related to DM Case Manager Clinical Goal(s):  . patient will work with BSW to address needs related to Level of care concerns and ADL IADL limitations in patient with DM Interventions:  . Collaborated with BSW to initiate plan of care to address needs related to Level of  care concerns and ADL IADL limitations in patient with DM Self Care Activities:  . Patient will attend all scheduled provider appointments . Patient will work with BSW to address care coordination needs and will continue to work with the clinical team to address health care and disease management related needs.   Patient Goals: - schedule and keep appointment for annual check-up  - work with Ubaldo Glassing, BSW for assistance with arranging [facility] for healthcare management Follow up plan:  The Managed Medicaid care management team will reach out to the patient again over the next 14 days.       Follow Up:  Patient agrees to Care Plan and Follow-up.  Plan: The Managed Medicaid care management team will reach out to the patient again over the next 15 days.  Date/time of next scheduled RN care management/care coordination outreach:  06/28/20 with Ubaldo Glassing, [place]. RNCM will reach  out to the patient again over the next 14 days.  Lurena Joiner RN, BSN [place]  [employer] manager

## 2020-06-15 ENCOUNTER — Other Ambulatory Visit: Payer: Self-pay

## 2020-06-15 ENCOUNTER — Ambulatory Visit (INDEPENDENT_AMBULATORY_CARE_PROVIDER_SITE_OTHER): Payer: Medicaid Other | Admitting: Pharmacist

## 2020-06-15 VITALS — BP 124/76 | Wt 268.2 lb

## 2020-06-15 DIAGNOSIS — E1065 Type 1 diabetes mellitus with hyperglycemia: Secondary | ICD-10-CM

## 2020-06-15 LAB — POCT URINALYSIS DIPSTICK: Glucose, UA: POSITIVE — AB

## 2020-06-15 LAB — POCT GLUCOSE (DEVICE FOR HOME USE): POC Glucose: 324 mg/dl — AB (ref 70–99)

## 2020-06-15 NOTE — Progress Notes (Addendum)
Endocrinology provider: Dr. Quincy Sheehan (upcoming appt 04/21/2020 10:30 am)  Dietitian: [name], RD (no upcoming appt) -Previous appts on 06/10/18, 02/25/19, and 12/22/2019  Behavioral health specialist: Dr. Huntley Dec (upcoming appt 04/21/2020 11:30 am) -No prior appt  Patient referred by Dr. Fransico Michael for diabetes education. PMH is significant for T1DM with severe insulin resistance, acanthosis, diabetic polyneuropathy, morbid obesity, prior hx of COVID-19 infection November 01, 2019),  Depression, and chronic inflammatory demyelinating neuropathy. Patient was lost to follow up from 02/25/19 to 12/03/19. At prior appt with Dr. Fransico Michael on 12/03/19, multiple issues were discussed. Patient states Spring/Summer 2021 he lost focus on his DM management due to how his mother passed away (now living with his grandmother). He has been guessing at his Novolog doses (not following the sliding scale plan provided by Dr Vanessa [place] on 11/04/18). He has SEVERE neuropathy; he can't stand up from a sitting position without using his arms for support and he also experiences numbness and loss of strength in the palm of his right hand. He was previously followed by Dr. Devonne Doughty, but has "aged out" of that practice. He was a no show for two NP appointments at [facility], once in Oct 01, 2022 when his mother died and once in 2022-11-01 when hew was admitted with DKA. Dr. Fransico Michael asked the grandmother to call [facility] and obtain a new appointment for him. Patient did mention though at prior appt with Dr Fransico Michael that he wants help in managing his DM. Dr. Fransico Michael advised patient to continue Tresiba 100 units daily (200 units/mL pen) and to re-start Novolog sliding scale + fixed dose of Novolog 10 units three times daily prior to meals. Dr. Fransico Michael would like to transition patient to a 2 component plan (carb + correction factor) for rapid acting insulin in the future.  At prior appt with myself on 03/04/20, patient changed from office  visit to virtual visit due to feeling sick. He stated he had a runny nose and sore throat. He denied abdominal pain, [place] respirations, fruity breath. He last checked his ketones on 03/02/20, which were trace. He was unable leave house right now to get COVID-19 tested due to ice on his porch. He was having issues with adherence with [place] and Novolog. He had was takiing TDD 130 units. BG were 97% very high, 3% high per Dexcom Clarity report. Increased TDD 130 --> 156 (20% increase); new basal dose was [place] U200 66 units daily and new bolus dose will be Humalog U200 30 units three times daily (10:30 AM, 3:30 PM, 6:30 PM). Continued metformin 1 g BID. Assisted patient download realarm app onto his computer to assist with adherence. Also discussed plan with grandmother who wrote instructions down to also assist with adherence. Emailed patient information about COVID-19 testing sites for when weather improved as well as at home COVID-19 testing incase weather did not improve.  At prior appt with Dr. Quincy Sheehan on 05/25/20, patient forgot his Dexcom receiver. His A1c was 13.9 (05/25/20). Dr. Quincy Sheehan decided to add GLP-1 agonist (originally Ozempic, but discussed with Dr. Quincy Sheehan and will rather do Trulicity due to insurance and autoinjector). She also discussed and came to the decision with [place] that home health is necessary.   Patient and grandmother present for follow up appointment. Patient has brought his Dexcom supplies for assistance in applying new sensor/transmitter as well as setting up Dexcom receiver. He has not been wearing Dexcom and he forgot to bring his manual BG meter. He also brings Trulicity prescription to appt. He  forgot his BG meter. He reports he has been taking metformin XR 1000 mg in the AM and PM, Humalog 20 units 1x daily, and Tresiba 60-80 units daily, and Ozempic 0.5 mg once weekly. Patient has been taking Ozempic 0.5 mg subQ once weekly since 05/25/20; grandmother has been giving  Ozempic. He forgot to take Ozempic once on a Tuesday and now takes on Wednesdays. He has taken 3 doses so far.   He said home health representative has contacted him. He refused home health services related to assisting with ADL (such as assisting going to the bathroom and showering) as he states "I do not need help off the toilet and in the shower. He states "I told them I only needed help with my medicine but they wanted to help me with other stuff so I told them no". He states the "lady he talked to on the phone was thinking he was recently diagnosed with DM and the lady was trying to enroll him in programs he had been enrolled in previously."   He has been seeing Dr. Bonita Quin in [facility] every 3 months. He states he has been getting cholesterol pills and Dexcom supplies through her (has an active prescription for Dexcom through [name] at [facility] as well).  School: not in school right now; planning to re-enrol in [place] (he is unsure of time of re-enroll)  Insurance Coverage: Managed Medicaid [facility] plan; ID # [id number])  Diabetes Diagnosis: 04/23/18  Family History: T2DM (maternal grandmother, paternal grandfather); T1DM (paternal grandmother); DM (uncle)  Preferred Pharmacy [facility] [phone] - [place], [place] - [address] AT [facility] [address]  [address] [place] [address]  Phone:  [phone]  Fax:  [phone]  DEA #:  [id number]  Medication Adherence -Patient denies consistent adherence adherence with [name] and Afrezza/Humalog -Current diabetes medications include: metformin IR 1000 mg twice daily, Tresiba U200 60-80 units daily, Humalog U200 20 units daily -Prior diabetes medications include: Afrezza (more challenging to use considering patient's dexterity)  Injection Sites (no changes since prior appt on 03/04/2020) -Patient-reports injection sites are right arm, abdomen, right leg --Patient reports independently  injecting DM medications. --Patient reports rotating injection sites  Diet (no changes since prior appt on 03/04/2020) Patient reported dietary habits (gets $90 in food stamps each month) Eats 2 meals/day and 1 snacks/day Breakfast (8:30-9:00 am): 3 eggs with 2 pieces of whole wheat nature's own brand toast, no [place] breast or bologna Lunch ("anytime"; 12-1PM or sometimes ~3-4PM): sandwich (mayonaise, bologna, cheese) Dinner (10-11pm): skips Snacks: no snacks  Drinks: water (24 bottle case in 2-3 days), a little bit of milk  Exercise (no changes since prior appt on 03/04/2020) Patient-reported exercise habits: walks around house (10 min at a time, 2x daily), sometimes walk around porch; sometimes leg kicks in bed everyday for 5 min, doing exercises recommended by PT (using band, marching) does for 5 minutes each day    Dexcom Clarity Report - Unable to assess as he has not been wearing. He did not bring manual BG meter either.  Vitals:   06/15/20 1358  BP: 124/76    Lab Results  Component Value Date   HGBA1C 13.9 (A) 05/25/2020   HGBA1C >14 01/26/2020   HGBA1C 14.4 (H) 10/21/2019    Lab Results  Component Value Date   CPEPTIDE 3.8 04/23/2018       Assessment:  Patient's goal with DM management: "Get diabetes controlled as independently  as possible"  Medication Management/monitoring: Unable to fully assess DM management considering patient did not bring meter to appt. He denies s/x of hypoglycemia.Considering lack of adherence to medications and current social situation discussed case with Dr. Quincy Sheehan. To keep regimen as simple as possible Dr. Quincy Sheehan and I advised patient to change Ozempic 0.5 mg subQ once weekly to Trulicity 3 mg once weekly due to issues with dexterity. We will also STOP Novolog and advise patient to take Tresiba 40 units daily. Continue metformin IR 1000 mg twice daily. Counseled patient thoroughly on dosing and administration of Trulicity (also provided  written handout). He will take TWO of the Trulicity 0.75 mg subQ injections (total 1.5 mg subQ once weekly) for x 2 weeks. Then he will increase to Trulicity 3 mg subQ once weekly. Follow up within 1 month if patient is unable to have appt scheduled with internal medicine service.   I am concerned for patient as I have followed with him since October 2021 almost weekly and patient struggles to follow directions even though I provide written handouts, email him a copy of instructions, and he will repeat back instructions to me prior to ending appointment, however, he often forgets what he was instructed to do. He is very adamant about being independent with his care, but I feel he is not capable of being independent in regards to care.  Home Health: Thoroughly discusses benefits of home health. Patient is willing to accept all services of home health and is willing to relay this information at f/u call with [facility] regarding this information.   Internal Medicine Referral - BG upon arrival to clinic was 324 and patient had large ketones. Discussed potentially admitting patient to [facility] Internal Medicine service with Dr. Fransico Michael and Dr. Quincy Sheehan considering prior history of no shows to appt, current ketonuria/hyperglycemia, and to see if possible to fast track assistance with home health (appreciate Dr. Fransico Michael and Dr. Quincy Sheehan expertise and assistance). Internal medicine resident advised Dr. Fransico Michael if patient is stable to make follow up outpatient appt with clinic, however, if patient is acutely ill then discuss admission. Patient is not acutely ill. . Discussed with patient considering his age, complex disease states, and social situation it is ideal for him to solely follow with Internal Medicine provider (NOT [facility] or [facility]). Patient and grandmother are agreeable. Will send in referral to Internal Medicine service within [facility].    Plan: Medications:  Change Ozempic 0.5  mg subQ once weekly --> Trulicity 3 mg subQ once weekly Change Tresiba U200 66 units daily --> 40 units daily  STOP Humalog U200  30 units three times daily (10:30 am, 3:30 pm, 6:30 pm) Continue metformin 1000 mg twice daily Monitoring Continue Dexcom G6 CGM Provided Dexcom sensor/transmitter sample [employer] has a diagnosis of diabetes, checks blood glucose readings > 4x per day, treats with > 3 insulin injections, and requires frequent adjustments to insulin regimen. This patient will be seen every six months, minimally, to assess adherence to their CGM regimen and diabetes treatment plan Follow Up: within 1 month if patient is not able to have follow up with internal medicine clinic Placed referral for Internal Medicine   This appointment required 120 minutes of patient care (this includes precharting, chart review, review of results, face to face care, etc.).  Thank you for involving clinical pharmacist/diabetes educator to assist in providing this patient's care.  Zachery Conch, PharmD, CPP, CDCES  Molli Knock, MD, CDE

## 2020-06-16 ENCOUNTER — Ambulatory Visit (INDEPENDENT_AMBULATORY_CARE_PROVIDER_SITE_OTHER): Payer: Medicaid Other | Admitting: Psychology

## 2020-06-17 ENCOUNTER — Telehealth (INDEPENDENT_AMBULATORY_CARE_PROVIDER_SITE_OTHER): Payer: Self-pay

## 2020-06-17 NOTE — Telephone Encounter (Addendum)
Received fax from covermymeds that patient Sensor PA was about to expire and they initiated it.   Completed prior authorization on covermymeds  Key: [id number] - PA Case ID: [id number] 06/17/2020 - sent to plan 06/21/2020

## 2020-06-18 NOTE — Telephone Encounter (Signed)
Patient referred to Internal Medicine

## 2020-06-18 NOTE — Telephone Encounter (Signed)
A user error has taken place: encounter opened in error, closed for administrative reasons.

## 2020-06-21 ENCOUNTER — Ambulatory Visit: Payer: Medicaid Other | Attending: Pediatrics | Admitting: Occupational Therapy

## 2020-06-21 DIAGNOSIS — R278 Other lack of coordination: Secondary | ICD-10-CM | POA: Insufficient documentation

## 2020-06-21 DIAGNOSIS — M25531 Pain in right wrist: Secondary | ICD-10-CM | POA: Insufficient documentation

## 2020-06-21 DIAGNOSIS — R208 Other disturbances of skin sensation: Secondary | ICD-10-CM | POA: Insufficient documentation

## 2020-06-21 DIAGNOSIS — R2689 Other abnormalities of gait and mobility: Secondary | ICD-10-CM | POA: Insufficient documentation

## 2020-06-21 DIAGNOSIS — R2681 Unsteadiness on feet: Secondary | ICD-10-CM | POA: Insufficient documentation

## 2020-06-21 DIAGNOSIS — M6281 Muscle weakness (generalized): Secondary | ICD-10-CM | POA: Insufficient documentation

## 2020-06-22 ENCOUNTER — Ambulatory Visit (INDEPENDENT_AMBULATORY_CARE_PROVIDER_SITE_OTHER): Payer: Medicaid Other | Admitting: Pediatrics

## 2020-06-24 ENCOUNTER — Ambulatory Visit (INDEPENDENT_AMBULATORY_CARE_PROVIDER_SITE_OTHER): Payer: Medicaid Other | Admitting: Internal Medicine

## 2020-06-24 ENCOUNTER — Encounter: Payer: Self-pay | Admitting: Internal Medicine

## 2020-06-24 VITALS — BP 126/88 | HR 98 | Temp 98.2°F | Wt 280.4 lb

## 2020-06-24 DIAGNOSIS — E1065 Type 1 diabetes mellitus with hyperglycemia: Secondary | ICD-10-CM | POA: Diagnosis present

## 2020-06-24 DIAGNOSIS — G6181 Chronic inflammatory demyelinating polyneuritis: Secondary | ICD-10-CM | POA: Diagnosis not present

## 2020-06-24 LAB — GLUCOSE, CAPILLARY: Glucose-Capillary: 308 mg/dL — ABNORMAL HIGH (ref 70–99)

## 2020-06-24 NOTE — Progress Notes (Deleted)
Subjective:   Patient ID: Lucas Cunningham male   DOB: January 15, 2001 20 y.o.   MRN: [id number]  HPI: [name] is a 21 y.o. male with a past medical history of type 1 diabetes and CIDP who presents today for establishment of care.     Past Medical History:  Diagnosis Date  . CIDP (chronic inflammatory demyelinating polyneuropathy) ([place])   . Diabetes mellitus without complication ([facility])    type 1   Current Outpatient Medications  Medication Sig Dispense Refill  . Accu-Chek FastClix Lancets MISC 1 Device by Does not apply route as directed. Use with Fastclix lancet device to check blood sugar up to 8x daily. (diagnosis code E10.65) (Patient not taking: No sig reported) 300 each 11  . acetone, urine, test strip 1 strip by Does not apply route as needed for high blood sugar. Up to 8x daily if necessary (Patient not taking: No sig reported) 360 strip 11  . atorvastatin (LIPITOR) 10 MG tablet Take 1 tablet by mouth daily.    . Continuous Blood Gluc Receiver (DEXCOM G6 RECEIVER) DEVI 1 Device by Does not apply route as directed. (Patient not taking: No sig reported) 1 each 2  . Continuous Blood Gluc Receiver (DEXCOM G6 RECEIVER) DEVI USE 1 DEVICE AS DIRECTED. (Patient not taking: Reported on 06/15/2020) 1 each 2  . Continuous Blood Gluc Sensor (DEXCOM G6 SENSOR) MISC Inject 1 applicator into the skin as directed. (change sensor every 10 days) 3 each 11  . Continuous Blood Gluc Sensor (DEXCOM G6 SENSOR) MISC INJECT 1 APPLICATOR INTO THE SKIN AS DIRECTED. (CHANGE SENSOR EVERY 10 DAYS) 3 each 11  . Continuous Blood Gluc Transmit (DEXCOM G6 TRANSMITTER) MISC Inject 1 Device into the skin as directed. (re-use up to 8x with each new sensor) 1 each 3  . Continuous Blood Gluc Transmit (DEXCOM G6 TRANSMITTER) MISC INJECT 1 DEVICE INTO THE SKIN AS DIRECTED. (RE-USE UP TO 8X WITH EACH NEW SENSOR) 1 each 3  . Dulaglutide (TRULICITY) 1.70 YF/7.4BS SOPN Inject 0.75 mg into the skin once a week. 2 mL 6  .  gabapentin (NEURONTIN) 300 MG capsule Take 1 capsule (300 mg total) by mouth 2 (two) times daily. (Patient not taking: No sig reported) 60 capsule 5  . Glucagon (BAQSIMI TWO PACK) 3 MG/DOSE POWD Place 1 spray into the nose as directed. (Patient not taking: No sig reported) 2 each 3  . Glucose Blood (BLOOD GLUCOSE TEST STRIPS) STRP 1 Device by Other route as directed. To check blood sugar up to 8x per day (Patient not taking: No sig reported) 300 strip 11  . insulin degludec (TRESIBA FLEXTOUCH) 200 UNIT/ML FlexTouch Pen Inject 100 Units into the skin at bedtime. 30 mL 6  . insulin lispro (HUMALOG KWIKPEN) 200 UNIT/ML KwikPen Inject up to 100 units daily per provider instructions 30 mL 11  . Insulin Pen Needle (BD PEN NEEDLE NANO U/F) 32G X 4 MM MISC Inject up to 8 times per day. 250 each 6  . Insulin Regular Human (AFREZZA) 4 & 8 & 12 units POWD Inhale up to 96 units daily (Patient not taking: No sig reported) 360 each 11  . Insulin Regular Human 4 & 8 & 12 units POWD INHALE UP TO 96 UNITS DAILY (Patient not taking: Reported on 06/15/2020) 360 each 11  . ketoconazole (NIZORAL) 2 % cream Apply to fet daily. (Patient not taking: No sig reported) 30 g 6  . Lancets Misc. (ACCU-CHEK FASTCLIX LANCET) KIT 1 Device by Does  not apply route as directed. Use with lancets to check blood sugar up to 8x daily.  (diagnosis code E10.65) (Patient not taking: No sig reported) 1 kit 3  . metFORMIN (GLUCOPHAGE) 1000 MG tablet Take 1 tablet (1,000 mg total) by mouth 2 (two) times daily with a meal. 180 tablet 3  . ondansetron (ZOFRAN ODT) 8 MG disintegrating tablet Take 1 tablet (8 mg total) by mouth every 8 (eight) hours as needed for nausea or vomiting. (Patient not taking: No sig reported) 20 tablet 6  . ondansetron (ZOFRAN-ODT) 8 MG disintegrating tablet TAKE 1 TABLET (8 MG TOTAL) BY MOUTH EVERY 8 (EIGHT) HOURS AS NEEDED FOR NAUSEA OR VOMITING. (Patient not taking: Reported on 06/15/2020) 20 tablet 6  . PRESCRIPTION  MEDICATION Inject into the vein. IVIG every 4 weeks. Order for new start sent to [facility] on 04/08/2020.     No current facility-administered medications for this visit.   Family History  Problem Relation Age of Onset  . Heart disease Mother   . Hypertension Mother   . Diabetes Maternal Grandmother   . Diabetes Maternal Grandfather   . Diabetes Paternal Grandmother   . Migraines Neg Hx   . Seizures Neg Hx   . Autism Neg Hx   . ADD / ADHD Neg Hx   . Anxiety disorder Neg Hx   . Depression Neg Hx   . Bipolar disorder Neg Hx   . Schizophrenia Neg Hx    Social History   Socioeconomic History  . Marital status: Single    Spouse name: Not on file  . Number of children: Not on file  . Years of education: Not on file  . Highest education level: Not on file  Occupational History  . Not on file  Tobacco Use  . Smoking status: Passive Smoke Exposure - Never Smoker  . Smokeless tobacco: Never Used  Substance and Sexual Activity  . Alcohol use: Not Currently  . Drug use: Not Currently  . Sexual activity: Not Currently  Other Topics Concern  . Not on file  Social History Narrative   Lives with his grandmother. He graduated HS 2021   Right handed   Caffeine: 2 cups/day   Social Determinants of Health   Financial Resource Strain: Not on file  Food Insecurity: Food Insecurity Present  . Worried About Charity fundraiser in the Last Year: Sometimes true  . Ran Out of Food in the Last Year: Sometimes true  Transportation Needs: Not on file  Physical Activity: Not on file  Stress: Not on file  Social Connections: Not on file   Review of Systems: {Review Of Systems:30496} Objective:  Physical Exam: Vitals:   06/24/20 1540  BP: 126/88  Pulse: 98  Temp: 98.2 F (36.8 C)  TempSrc: Oral  SpO2: 98%  Weight: 280 lb 6.4 oz (127.2 kg)   {Exam, Complete:17964} Assessment & Plan:  1. ***

## 2020-06-24 NOTE — Assessment & Plan Note (Addendum)
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 Receiving IVIG for treatment. Mentions significant improvement in his symptoms. Going to rehab as well.  - F/u with neurology

## 2020-06-24 NOTE — Assessment & Plan Note (Addendum)
Lab Results  Component Value Date   HGBA1C 13.9 (A) 05/25/2020   Lucas Cunningham is a 20 yo Lucas w/ PMH of T1DM (insulin autoantibody positive) presenting to [facility] to establish care. He mentions he was advised to establish care with [facility] as he transition his care from his pediatrician. He mentions that he has a history of uncontrolled diabetes with difficulty with medication adherence. He mentions that he is currently on Tresiba and was recently told to stop his novolog. He also stopped his Humalog because he thought instruction to stop his Novolog included all short-acting insulin. He mentions that he has not started his Trulicity due to difficulty with med administration due to is CIDP. He mentions he has been on metformin since his first diagnosis when he was admitted for HHS.  A/P Present w/ long standing hx of uncontrolled type 1 diabetes with insulin resistance. Had prolonged discussion regarding his medication regimen. Advised that he should restart his insulin lispro as instruction to discontinue Novolog was because it was a duplicate medication for his Humalog. Also advised to take his [name] as prescribed. Discussed lack of evidence for improvement in glycemic control with metformin use in T1DM and option of discontinuing it to avoid GI side effects which Lucas Cunningham would like to do. Also stressed importance of using his insulin as prescribed. Also advised to bring his glucometer on his next visit. Patient is unsure if his pediatric endocrinology office will no longer follow him. - May need referral for endocrinology if patient is no longer following his previous endocrinologist office advised patient to reach out to their office for clarification - Restart insulin lispro 30 units TID qc - C/w Insulin degludec 100 units qhs - C/w dulaglutide 0.75mg  weekly (per ADA 2021 guideline, small benefit in a1c and improve weight loss) - D/c metformin - Bring glucometer next visit - BMP - Referral to podiatry as  pt unable to perform his own foot care

## 2020-06-24 NOTE — Patient Instructions (Signed)
Thank you for allowing [name] to provide your care today. Today we discussed your diabetes    I have ordered bmp labs for you. I will call if any are abnormal.    Today we made no changes to your medications.    Please make sure to resume your humalog Please stop your metformin  Please follow-up in 3 months.    Should you have any questions or concerns please call the internal medicine clinic at [phone].     Correction Insulin Correction insulin, also called a supplemental dose, is a small amount of insulin that can be used to lower your blood sugar (glucose) if it is too high. This dose brings your glucose level back to the target range. You will be instructed to check your glucose at certain times of the day and to use correction insulin as needed to lower your blood glucose. Correction insulin is primarily used as part of diabetes management. It may also be prescribed for people who do not have diabetes. What is a correction scale? A correction scale, also called a sliding scale, is prescribed by your health care provider to help you determine when you need correction insulin. Your correction scale is based on your individual treatment goals, and it has two parts:  Ranges of blood glucose levels.  How much correction insulin to give yourself if your blood sugar falls within a certain range. If your blood glucose is in your desired range, you will not need correction insulin. What type of insulin do I need? You may be prescribed rapid-acting or short-acting insulin as correction insulin. Talk with your health care provider or pharmacist about which type of correction insulin to take and when to take it. Rapid-acting insulin This insulin:  Starts working in the body (onset) in as little as 15 minutes.  Is at its highest strength (peak) in 1.5-2 hours.  Lasts (duration) for 4-5 hours. Short-acting insulin This insulin:  Starts working in the body (onset) in about 30  minutes.  Is at its highest strength (peak) in 2-3 hours.  Lasts (duration) for 3-6 hours. How do I manage my blood glucose with correction insulin? Giving a correction dose  Check your blood glucose as directed by your health care provider.  Use your correction scale to find the range that your blood glucose is in.  Identify the units of insulin that match your blood glucose range.  Give yourself the dose of correction insulin that your health care provider has prescribed in your correction scale. Always make sure you are using the right type of insulin.   Keeping a blood glucose log  Write down your blood glucose test results and the amount of insulin that you give yourself. Do this every time you check blood glucose or take insulin. Bring this log with you to your medical visits. This information will help your health care provider manage your medicines.  Note anything that may affect your blood glucose, such as: ? Changes in normal exercise or activity. ? Changes in your normal schedule, such as changes in your sleep routine, going on vacation, changing your diet, or holidays. ? New over-the-counter or prescription medicines. ? Illness, stress, or anxiety. ? Changes in the time that you took your medicine or insulin. ? Changes in your meals, such as skipping a meal, having a late meal, or dining out. ? Eating things that may affect blood glucose, such as snacks, meal portions that are larger than normal, drinks that contain sugar, or  eating less than usual.   Why do I need correction insulin if I do not have diabetes? If you do not have diabetes, your health care provider may prescribe insulin because:  Keeping your blood glucose in the target range is important for your overall health.  You are taking medicines that cause your blood glucose to be higher than normal. Contact a health care provider if:  You have high blood glucose that you are not able to correct with correction  insulin.  You develop a low blood glucose that you are not able to treat yourself.  Your blood glucose is often too low. Get help right away if:  You become unresponsive. If this happens, someone else should call emergency services (911 in the [place] right away.  Your blood glucose is lower than 54 mg/dL (3.0 mmol/L).  You become confused or you have trouble thinking clearly.  You have difficulty breathing. Summary  Correction insulin is primarily used in diabetes management. It can also be prescribed for people who do not have diabetes.  Correction insulin is a small amount of insulin that can be used to lower your blood glucose if it is too high. It brings your glucose level to the target range.  You will be instructed to check your blood glucose at certain times of the day and to use correction insulin as needed. Always keep a log of your blood glucose values and the amount of insulin you take.  Talk with your health care provider or pharmacist about the type of correction insulin to take and when to take it. This information is not intended to replace advice given to you by your health care provider. Make sure you discuss any questions you have with your health care provider. Document Revised: 12/02/2018 Document Reviewed: 12/02/2018 Elsevier Patient Education  2021 [employer].

## 2020-06-24 NOTE — Progress Notes (Signed)
CC: High sugars  HPI: Mr.Duilio Tremblay is a 20 y.o. with PMH listed below presenting with complaint of hyperglycemia. Please see problem based assessment and plan for further details.  Past Medical History:  Diagnosis Date  . CIDP (chronic inflammatory demyelinating polyneuropathy) (HCC)   . Diabetes mellitus without complication (HCC)    type 1   Review of Systems: Review of Systems  Constitutional: Negative for chills, fever and malaise/fatigue.  Eyes: Negative for blurred vision.  Respiratory: Negative for shortness of breath.   Cardiovascular: Negative for chest pain, palpitations and leg swelling.  Gastrointestinal: Negative for constipation, diarrhea, nausea and vomiting.  Neurological: Positive for focal weakness and weakness.  All other systems reviewed and are negative.   Physical Exam: Vitals:   06/24/20 1540  BP: 126/88  Pulse: 98  Temp: 98.2 F (36.8 C)  TempSrc: Oral  SpO2: 98%  Weight: 280 lb 6.4 oz (127.2 kg)   Gen: Well-developed, well nourished, NAD HEENT: NCAT head, hearing intact CV: RRR, S1, S2 normal Pulm: CTAB, No rales, no wheezes Extm: ROM intact, Peripheral pulses intact, No peripheral edema Skin: Dry, Warm, normal turgor, no wounds Neuro: Bilateral wrist drop, bilateral foot drop, proximal muscle strength bilateral UE 3/5, bilateral lower extremities 5/5 on proximal muscles, 2/5 on plantar / dorsiflexion of feet, pinprick intact bilateral feet  Assessment & Plan:   Uncontrolled type 1 diabetes mellitus with hyperglycemia (HCC) Lab Results  Component Value Date   HGBA1C 13.9 (A) 05/25/2020   Mr.Sambrano is a 20 yo M w/ PMH of T1DM (insulin autoantibody positive) presenting to [facility] to establish care. He mentions he was advised to establish care with [facility] as he transition his care from his pediatrician. He mentions that he has a history of uncontrolled diabetes with difficulty with medication adherence. He mentions that he is currently on Tresiba  and was recently told to stop his novolog. He also stopped his Humalog because he thought instruction to stop his Novolog included all short-acting insulin. He mentions that he has not started his Trulicity due to difficulty with med administration due to is CIDP. He mentions he has been on metformin since his first diagnosis when he was admitted for HHS.  A/P Present w/ long standing hx of uncontrolled type 1 diabetes with insulin resistance. Had prolonged discussion regarding his medication regimen. Advised that he should restart his insulin lispro as instruction to discontinue Novolog was because it was a duplicate medication for his Humalog. Also advised to take his [name] as prescribed. Discussed lack of evidence for improvement in glycemic control with metformin use in T1DM and option of discontinuing it to avoid GI side effects which Mr.Fassler would like to do. Also stressed importance of using his insulin as prescribed. Also advised to bring his glucometer on his next visit. Patient is unsure if his pediatric endocrinology office will no longer follow him. - May need referral for endocrinology if patient is no longer following his previous endocrinologist office advised patient to reach out to their office for clarification - Restart insulin lispro 30 units TID qc - C/w Insulin degludec 100 units qhs - C/w dulaglutide 0.75mg  weekly (per ADA 2021 guideline, small benefit in a1c and improve weight loss) - D/c metformin - Bring glucometer next visit - BMP - Referral to podiatry as pt unable to perform his own foot care  CIDP (chronic inflammatory demyelinating polyneuropathy) (HCC) Diagnosed with CIDP after extensive work-up with Dr.Ahern in neurology. Receiving IVIG for treatment. Mentions significant improvement in  his symptoms. Going to rehab as well.  - F/u with neurology   Patient discussed with Dr. Antony Contras  -Judeth Cornfield, PGY3 [facility] Internal Medicine Pager: [phone]

## 2020-06-25 ENCOUNTER — Other Ambulatory Visit: Payer: Self-pay

## 2020-06-25 LAB — BMP8+ANION GAP
Anion Gap: 17 mmol/L (ref 10.0–18.0)
BUN/Creatinine Ratio: 33 — ABNORMAL HIGH (ref 9–20)
BUN: 16 mg/dL (ref 6–20)
CO2: 19 mmol/L — ABNORMAL LOW (ref 20–29)
Calcium: 10.4 mg/dL — ABNORMAL HIGH (ref 8.7–10.2)
Chloride: 96 mmol/L (ref 96–106)
Creatinine, Ser: 0.48 mg/dL — ABNORMAL LOW (ref 0.76–1.27)
Glucose: 284 mg/dL — ABNORMAL HIGH (ref 65–99)
Potassium: 4.5 mmol/L (ref 3.5–5.2)
Sodium: 132 mmol/L — ABNORMAL LOW (ref 134–144)
eGFR: 153 mL/min/{1.73_m2} (ref >59)

## 2020-06-28 ENCOUNTER — Encounter: Payer: Self-pay | Admitting: Occupational Therapy

## 2020-06-28 ENCOUNTER — Ambulatory Visit: Payer: Medicaid Other | Admitting: Occupational Therapy

## 2020-06-28 ENCOUNTER — Other Ambulatory Visit: Payer: Self-pay

## 2020-06-28 DIAGNOSIS — R278 Other lack of coordination: Secondary | ICD-10-CM | POA: Diagnosis present

## 2020-06-28 DIAGNOSIS — R208 Other disturbances of skin sensation: Secondary | ICD-10-CM

## 2020-06-28 DIAGNOSIS — M6281 Muscle weakness (generalized): Secondary | ICD-10-CM

## 2020-06-28 DIAGNOSIS — M25531 Pain in right wrist: Secondary | ICD-10-CM

## 2020-06-28 DIAGNOSIS — R2681 Unsteadiness on feet: Secondary | ICD-10-CM

## 2020-06-28 DIAGNOSIS — R2689 Other abnormalities of gait and mobility: Secondary | ICD-10-CM

## 2020-06-28 NOTE — Patient Outreach (Signed)
Medicaid Managed Care Social Work Note  06/28/2020 Name:  Lucas Cunningham MRN:  [id number] DOB:  July 31, 2000  Lucas Cunningham is an 20 y.o. year old male who is a primary patient of Lucas Cunningham, Lucas Netters, NP.  The Medicaid Managed Care Coordination team was consulted for assistance with:  home health services  [name] was given information about Medicaid Managed CareCoordination services today. Lucas Cunningham agreed to services and verbal consent obtained.  Engaged with patient  for by telephone forfollow up visit in response to referral for case management and/or care coordination services.   Assessments/Interventions:  Review of past medical history, allergies, medications, health status, including review of consultants reports, laboratory and other test data, was performed as part of comprehensive evaluation and provision of chronic care management services.  SDOH: (Social Determinant of Health) assessments and interventions performed:  BSW contacted patient and spoke with patient and grandmother. They both stated that they have not heard anything from [facility] about getting an assessment completed. BSW will contact [facility] to check on referral. Patient stated that they have started receiving foodstamps and have not heard anything about disabliity.    Advanced Directives Status:  Not addressed in this encounter.  Care Plan                 No Known Allergies  Medications Reviewed Today    Reviewed by Mosetta Anis, MD (Resident) on 06/24/20 at [place] List Status: <None>  Medication Order Taking? Sig Documenting Provider Last Dose Status Informant  Accu-Chek FastClix Lancets MISC [id number] No 1 Device by Does not apply route as directed. Use with Fastclix lancet device to check blood sugar up to 8x daily. (diagnosis code E10.65)  Patient not taking: No sig reported   Sherrlyn Hock, MD Not Taking Active   acetone, urine, test strip [id number] No 1 strip by Does not apply  route as needed for high blood sugar. Up to 8x daily if necessary  Patient not taking: No sig reported   Sherrlyn Hock, MD Not Taking Active   atorvastatin (LIPITOR) 10 MG tablet [id number] No Take 1 tablet by mouth daily. [provider] Taking Active   Continuous Blood Gluc Receiver (DEXCOM G6 RECEIVER) [place]VI [id number] No 1 Device by Does not apply route as directed.  Patient not taking: No sig reported   Sherrlyn Hock, MD Not Taking Active   Continuous Blood Gluc Receiver [facility] G6 RECEIVER) [place] [id number] No USE 1 DEVICE AS DIRECTED.  Patient not taking: Reported on 06/15/2020   Sherrlyn Hock, MD Not Taking Active   Continuous Blood Gluc Sensor (DEXCOM G6 SENSOR) MISC [id number] No Inject 1 applicator into the skin as directed. (change sensor every 10 days) Sherrlyn Hock, MD Taking Active   Continuous Blood Gluc Sensor (DEXCOM G6 SENSOR) MISC [id number] No INJECT 1 APPLICATOR INTO THE SKIN AS DIRECTED. (CHANGE SENSOR EVERY 10 DAYS) Sherrlyn Hock, MD Taking Active   Continuous Blood Gluc Transmit (DEXCOM G6 TRANSMITTER) MISC [id number] No Inject 1 Device into the skin as directed. (re-use up to 8x with each new sensor) Sherrlyn Hock, MD Taking Active   Continuous Blood Gluc Transmit (DEXCOM G6 TRANSMITTER) MISC [id number] No INJECT 1 DEVICE INTO THE SKIN AS DIRECTED. (RE-USE UP TO 8X WITH EACH NEW SENSOR) Sherrlyn Hock, MD Taking Active   Dulaglutide (TRULICITY) 3.01 SW/1.0XN SOPN [id number] No Inject 0.75 mg into the skin once a week. Al Corpus, MD Taking Active   gabapentin (NEURONTIN)  300 MG capsule [id number] No Take 1 capsule (300 mg total) by mouth 2 (two) times daily.  Patient not taking: No sig reported   Teressa Lower, MD Not Taking Active            Med Note Zachary George, KELLY A   Wed Dec 03, 2019 11:22 AM) Has not started taking  Glucagon (BAQSIMI TWO PACK) 3 MG/DOSE POWD [id number] No Place 1 spray into the nose as directed.  Patient not  taking: No sig reported   Sherrlyn Hock, MD Not Taking Active   Glucose Blood (BLOOD GLUCOSE TEST STRIPS) STRP [id number] No 1 Device by Other route as directed. To check blood sugar up to 8x per day  Patient not taking: No sig reported   Sherrlyn Hock, MD Not Taking Active   insulin degludec (TRESIBA FLEXTOUCH) 200 UNIT/ML FlexTouch Pen [id number] No Inject 100 Units into the skin at bedtime. Al Corpus, MD Taking Active   insulin lispro [facility]) 200 UNIT/ML KwikPen [id number] No Inject up to 100 units daily per provider instructions Sherrlyn Hock, MD Taking Active   Insulin Pen Needle (BD PEN NEEDLE NANO U/F) 32G X 4 MM MISC [id number] No Inject up to 8 times per day. Al Corpus, MD Taking Active   Insulin Regular Human [facility]) 4 & 8 & 12 units POWD [id number] No Inhale up to 96 units daily  Patient not taking: No sig reported   Sherrlyn Hock, MD Not Taking Active   Insulin Regular Human 4 & 8 & 12 units POWD [id number] No INHALE UP TO 96 UNITS DAILY  Patient not taking: Reported on 06/15/2020   Sherrlyn Hock, MD Not Taking Active   ketoconazole (NIZORAL) 2 % cream [id number] No Apply to fet daily.  Patient not taking: No sig reported   Sherrlyn Hock, MD Not Taking Active   Lancets Misc. (ACCU-CHEK FASTCLIX LANCET) KIT [id number] No 1 Device by Does not apply route as directed. Use with lancets to check blood sugar up to 8x daily.  (diagnosis code E10.65)  Patient not taking: No sig reported   Sherrlyn Hock, MD Not Taking Active   ondansetron (ZOFRAN ODT) 8 MG disintegrating tablet [id number] No Take 1 tablet (8 mg total) by mouth every 8 (eight) hours as needed for nausea or vomiting.  Patient not taking: No sig reported   Al Corpus, MD Not Taking Active   ondansetron (ZOFRAN-ODT) 8 MG disintegrating tablet [id number] No TAKE 1 TABLET (8 MG TOTAL) BY MOUTH EVERY 8 (EIGHT) HOURS AS NEEDED FOR NAUSEA OR VOMITING.  Patient not taking: Reported  on 06/15/2020   Al Corpus, MD Not Taking Active   PRESCRIPTION MEDICATION [id number] No Inject into the vein. IVIG every 4 weeks. Order for new start sent to [facility] on 04/08/2020. Melvenia Beam, MD Taking Active           Patient Active Problem List   Diagnosis Date Noted  . CIDP (chronic inflammatory demyelinating polyneuropathy) ([place]) 05/25/2020  . Noncompliance with medication treatment due to difficulty with dosing 05/25/2020  . Mixed hyperlipidemia 05/25/2020  . Depression 05/25/2020  . DKA, type 1 ([place]) 10/23/2019  . Diabetic polyneuropathy associated with type 1 diabetes mellitus ([place]) 02/26/2019  . Leg weakness, bilateral 02/25/2019  . Postprandial bloating 02/25/2019  . Sensory problems with limbs 02/25/2019  . Ketonuria 11/04/2018  . Diabetes ([place]) 04/23/2018  . Uncontrolled type 1 diabetes mellitus with hyperglycemia ([place]) 05/24/2017  . Morbid obesity ([place])  05/24/2017  . Insulin resistance 05/24/2017  . Acanthosis 05/24/2017    Conditions to be addressed/monitored per PCP order:  [place] : General Social Work (Adult)  Updates made by Ethelda Chick since 06/28/2020 12:00 AM    Problem: HH Services   Onset Date: 06/08/2020  Note:   Mendy Lapinsky is a 20 y.o. year old male who sees Skinner-Kiser, Lucas Netters, NP for primary care. The  [facility] Managed Care team was consulted for assistance with Limited access to food ADL IADL limitations. Mr. Nine was given information about Care Management services, agreed to services, and verbal consent for services was obtained.  Interventions:  . Patient interviewed and appropriate assessments performed . Collaborated with clinical team regarding patient needs  . SDOH (Social Determinants of Health) assessments performed: Yes .     Marland Kitchen Collaborated with [facility] for [facility] referral . BSW contacted patient and spoke with grandmother. She stated that due to patients neuropathy it makes it difficult  for him to do a lot of things on his own. Grandmother states he does need assistance with bathing, with the braces on his legs it makes it difficult for him. Patient keeps a jug in his room for toileting. Grandmother states she does the best she can, but assistance is needed. BSW contacted [facility] and spoke with Lucas Cunningham, a referral for [facility] was submitted and [facility] will reach out to patient to complete an assessment.  Cory Roughen stated she has applied for disability for patient but has not received an answer yet, grandmother is the only income in the household and food is getting hard to keep in the home. Grandmother states she has completed an application for foodstamps for patient and is waiting for a response. BSW provided grandmother with food pantry resources.  Marland Kitchen Update 06/28/20: BSW contacted patient and spoke with patient and grandmother. They both stated that they have not heard anything from [facility] about getting an assessment completed. BSW will contact [facility] to check on referral. Patient stated that they have started receiving foodstamps and have not heard anything about disabliity.   Plan:  . Over the next 30-60 days, patient will work with BSW to address needs related to ADL IADL limitations . Social Worker will follow up in 14 days.   Mickel Fuchs, BSW, [place]  High Risk Managed Medicaid Team           Follow up:  Patient agrees to Care Plan and Follow-up.  Plan: The Managed Medicaid care management team will reach out to the patient again over the next 14 days.  Date/time of next scheduled Social Work care management/care coordination outreach:  07/16/20  Mickel Fuchs, Arita Miss, [place] Managed Medicaid Team  [phone]

## 2020-06-28 NOTE — Progress Notes (Signed)
Internal Medicine Clinic Attending  Case discussed with Dr. Lee  At the time of the visit.  We reviewed the resident's history and exam and pertinent patient test results.  I agree with the assessment, diagnosis, and plan of care documented in the resident's note.    

## 2020-06-28 NOTE — Patient Instructions (Signed)
Visit Information  Lucas Cunningham was given information about Medicaid [facility] care coordination services as a part of their Lucas Cunningham Medicaid benefit. Lucas Cunningham verbally consented to engagement with the Lucas Cunningham [facility].   For questions related to your Lucas Cunningham health plan, please call: [phone]  If you would like to schedule transportation through your Lucas Cunningham plan, please call the following number at least 2 days in advance of your appointment: [phone].  Call the Behavioral Health Crisis Line at [phone], at any time, 24 hours a day, 7 days a week. If you are in danger or need immediate medical attention call 911.  Lucas Cunningham - following are the goals we discussed in your visit today:  Goals Addressed   None     Social Worker will follow up in 14 days.   Lucas Cunningham, Lucas Cunningham, Lucas Cunningham  Lucas Cunningham  High Risk Managed Medicaid Cunningham  [phone]  Following is a copy of your plan of care:  Patient Care Plan: General Plan of Care (Adult)    Problem Identified: Interdisciplinary Collaborative/Care Coordination   Priority: High  Onset Date: 06/11/2020    Long-Range Goal: Patient-Specific Goal   Start Date: 06/11/2020  Expected End Date: 09/10/2020  This Visit's Progress: On track  Priority: High  Note:   Current Barriers:  . Care Management support, education, and care coordination needs related to DM Case Manager Clinical Goal(s):  . patient will work with Lucas Cunningham to address needs related to Level of care concerns and ADL IADL limitations in patient with DM Interventions:  . Collaborated with Lucas Cunningham to initiate plan of care to address needs related to Level of care concerns and ADL IADL limitations in patient with DM Self Care Activities:  . Patient will attend all scheduled provider appointments . Patient will work with Lucas Cunningham to address care coordination needs and will continue to work with the clinical Cunningham to  address health care and disease management related needs.   Patient Goals: - schedule and keep appointment for annual check-up  - work with Lucas Cunningham, Lucas Cunningham for assistance with arranging [facility] for healthcare management Follow up plan:  The Managed Medicaid care management Cunningham will reach out to the patient again over the next 14 days.     Patient Care Plan: General Social Work (Adult)    Problem Identified: HH Services   Onset Date: 06/08/2020  Note:   Lucas Cunningham is a 20 y.o. year old male who sees Skinner-Kiser, Jeannette Corpus, NP for primary care. The  [facility] was consulted for assistance with Limited access to food ADL IADL limitations. [name] was given information about Care Management services, agreed to services, and verbal consent for services was obtained.  Interventions:  . Patient interviewed and appropriate assessments performed . Collaborated with clinical Cunningham regarding patient needs  . SDOH (Social Determinants of Health) assessments performed: Yes .     Marland Kitchen Collaborated with [name] for [place] [facility] referral . Lucas Cunningham contacted patient and spoke with grandmother. She stated that due to patients neuropathy it makes it difficult for him to do a lot of things on his own. Grandmother states he does need assistance with bathing, with the braces on his legs it makes it difficult for him. Patient keeps a jug in his room for toileting. Grandmother states she does the best she can, but assistance is needed. Lucas Cunningham contacted Lucas Cunningham and spoke with Lucas Cunningham, a referral for [facility] was submitted and Lucas Cunningham will reach out  to patient to complete an assessment.  Lucas Cunningham stated she has applied for disability for patient but has not received an answer yet, grandmother is the only income in the household and food is getting hard to keep in the home. Grandmother states she has completed an application for foodstamps for patient and is waiting for a response. Lucas Cunningham provided grandmother with food  pantry resources.  Marland Kitchen Update 06/28/20: Lucas Cunningham contacted patient and spoke with patient and grandmother. They both stated that they have not heard anything from Lucas Cunningham about getting an assessment completed. Lucas Cunningham will contact Lucas Cunningham to check on referral. Patient stated that they have started receiving foodstamps and have not heard anything about disabliity.   Plan:  . Over the next 30-60 days, patient will work with Lucas Cunningham to address needs related to ADL IADL limitations . Social Worker will follow up in 14 days.   Lucas Cunningham, Lucas Cunningham, Lucas Cunningham  Lucas Cunningham  High Risk Managed Medicaid Cunningham

## 2020-06-28 NOTE — Therapy (Signed)
[facility] [address] [place], [place], [address] Phone: [phone]   Fax:  [phone]  Occupational Therapy Treatment & Discharge  Patient Details  Name: Lucas Cunningham MRN: [id number] Date of Birth: [birth year] Referring Provider (OT): Percell Miller, NP   Encounter Date: 06/28/2020   OT End of Session - 06/28/20 1303    Visit Number 17    Number of Visits 22   +8 at renewal - may be less d/t scheduling   Date for OT Re-Evaluation 06/07/20    Authorization Type [employer]    Authorization Time Period Renewal completed 05/10/2020 for 4 weeks    OT Start Time 1300    OT Stop Time 1345    OT Time Calculation (min) 45 min    Activity Tolerance Patient tolerated treatment well    Behavior During Therapy [facility] for tasks assessed/performed           Past Medical History:  Diagnosis Date  . CIDP (chronic inflammatory demyelinating polyneuropathy) ([place])   . Diabetes mellitus without complication ([place])    type 1    Past Surgical History:  Procedure Laterality Date  . NO PAST SURGERIES      There were no vitals filed for this visit.   Subjective Assessment - 06/28/20 1309    Subjective  Pt reports doing medications are still challenging and he needs help with those. Pt reports he does everything else "perfectly fine". Pt reports missing his diabetic shoe appt and has not rescheduled.    Pertinent History PMH Type I diabetes, depression, peripheral neuropathy    Limitations Fall Risk. Sensation deficits in RUE    Patient Stated Goals Pt wants "to be able to grab things and write and use my fingers"    Currently in Pain? No/denies           Orthotic: assessed fit and donning and doffing splints for BUE and use. Pt req'd min A for donning RUE splint d/t poor pinch for pulling velcro straps. OT made modifications for increasing ease and independence with straps by making them longer for decreased grasp. Pt  was able to don splint with mod I x 2. Pt issued splint for discharge and for wearing at home for ADLs.       OCCUPATIONAL THERAPY DISCHARGE SUMMARY  Visits from Start of Care: 17  Current functional level related to goals / functional outcomes: Pt continues to require increased time and unsteadiness on feet with ADLs and IADLs.    Remaining deficits: Pt has limited grasp and wrist extension in BUE. Pt continues to require assistance for some ADLs and IADLs d/t limited grasp in BUE.    Education / Equipment: Splint for RUE, theraputty, HEPs, foam grip, elastic shoelaces  Plan: Patient agrees to discharge.  Patient goals were partially met. Patient is being discharged due to lack of progress.  ?????                     OT Short Term Goals - 05/10/20 1108      OT SHORT TERM GOAL #1   Title Pt will be independent with initial HEP targeting grip strength and range of motion    Baseline not issued yet    Time 4    Period Weeks    Status Achieved    Target Date 02/12/20      OT SHORT TERM GOAL #2   Title Pt will verbalize understanding of sensory strategies for RUE  and safety    Baseline have not reviewed    Time 4    Period Weeks    Status Achieved   received education on sensory strategies and safety for poor sensation in BUE     OT SHORT TERM GOAL #3   Title Pt will increase grip strength in LUE by 5 lbs and RUE by 2 lbs for increase in functional use of BUE for ADLs and IADLs    Baseline RUE 5 LUE 29.5    Time 4    Period Weeks    Status Not Met   04/07/20 LUE 14.5 lbs RUE 2.4 lbs  05/10/20 LUE 13.4bs RUE 1.9 lbs     OT SHORT TERM GOAL #4   Title Pt will perform donning and doffing socks and shoes with supervision for increase in independence with ADLs    Baseline unable to don socks consistently    Time 4    Period Weeks    Status Partially Met   pt is not able to put socks on without physical assistance but able to put shoes on. d/t limited grip pt  has increased difficulty with donning socks and new AFOs. Pt has been issued elastic shoelaces     OT SHORT TERM GOAL #5   Title Pt will perform simple cold meal prep (sandwich) and/or light housekeeping with supervision    Baseline not completing at this time    Time 4    Period Weeks    Status Achieved   04/07/20 - pt reports making cereal but not sandwich and participating in some home management            OT Long Term Goals - 05/10/20 1114      OT [place] #1   Title Pt will be independent with updated HEP 03/11/2020    Baseline not issued yet    Time 8    Period Weeks    Status On-going      OT LONG TERM GOAL #2   Title Pt will perform simple warm meal prep and/or light house keeping task with mod I and good safety awareness    Baseline not completing at eval    Time 8    Period Weeks    Status Deferred   cooked an egg with min to mod A but demonstrating much difficulty with BUE use for tasks     OT LONG TERM GOAL #3   Title Pt will improve grip strengthening in LUE by 10 lbs and RUE by 6 lbs for increased ability to perform clothing management for ADLs    Baseline LUE 29.5 RUE 5    Time 8    Period Weeks    Status Deferred   Pt has had decline in grip strength since eval d/t neuropathy and CIPD     OT LONG TERM GOAL #4   Title Pt will increased box and blocks score in RUE to 12 blocks or more for increase in functional use of RUE    Baseline RUE 6    Time 8    Period Weeks    Status On-going   04/07/20 - 7 blocks     OT LONG TERM GOAL #5   Title Pt will report decrease pain in RUE wrist with functional use to no more than 6/10    Baseline 9/10    Time 8    Period Weeks    Status Achieved   3/10     Long  Term Additional Goals   Additional Long Term Goals Yes      OT LONG TERM GOAL #6   Title Pt will verbalize understanding of adapted equipment and strategies PRN for basic ADLs and IADLs.    Baseline difficulty with donning socks, cutting up food and  fasteners    Time 8    Period Weeks    Status On-going   continue to address - pt received elastic shoelaces and foam grip.     OT LONG TERM GOAL #7   Title Pt will be compliant and verbalize understanding of wear and care instructions for any splints and/or orthoses PRN    Baseline RUE dorsal wrist cock up splint made - continue to address and work on one for the left.    Time 4    Period Weeks    Status New                 Plan - 06/28/20 1349    Clinical Impression Statement Pt continues to presents with impairments with complications from CIDP impeding unsteadiness on feet, overall independnece with ADLs and IADLs and BUE functional use. Pt is seeing slow and steady progress with IVIG but not enough at this time for substantial functional gains with BUE. Pt has demonstrated improved ability to grasp objects with RUE with use of dorsal block wrist splint for maintaining wrist in neutral. Pt is discharging from OT at this time due to limited progress with BUE and with poor carryover of adapted strategies discussed and reviewed in OT. Pt would benefit from OT in the home for assessing environment and ADLs and IADLs in his home and outpatient OT down the line.    OT Occupational Profile and History Problem Focused Assessment - Including review of records relating to presenting problem    Occupational performance deficits (Please refer to evaluation for details): IADL's;ADL's;Leisure;Social Participation    Body Structure / Function / Physical Skills ADL;Balance;Coordination;Decreased knowledge of use of DME;Edema;Flexibility;FMC;Dexterity;Strength;GMC;Pain;Tone;UE functional use;ROM;IADL;Sensation    Psychosocial Skills Coping Strategies    Rehab Potential Good    Clinical Decision Making Limited treatment options, no task modification necessary    Comorbidities Affecting Occupational Performance: None    Modification or Assistance to Complete Evaluation  No modification of tasks or  assist necessary to complete eval    OT Frequency 2x / week    OT Duration 4 weeks   2x/week for 4 weeks after renewal could be different d/t scheduling conflicts.   OT Treatment/Interventions Self-care/ADL training;Moist Heat;DME and/or AE instruction;Splinting;Balance training;Therapeutic activities;Aquatic Therapy;Therapeutic exercise;Ultrasound;Electrical Stimulation;Cryotherapy;Neuromuscular education;Paraffin;Energy conservation;Manual Therapy;Patient/family education;Passive range of motion;Functional Mobility Training    Plan discharge OT    Recommended Other Services Pt is receiving PT    Consulted and Agree with Plan of Care Patient;Family member/caregiver    Family Member Consulted grandmother           Patient will benefit from skilled therapeutic intervention in order to improve the following deficits and impairments:   Body Structure / Function / Physical Skills: ADL,Balance,Coordination,Decreased knowledge of use of DME,Edema,Flexibility,FMC,Dexterity,Strength,GMC,Pain,Tone,UE functional use,ROM,IADL,Sensation   Psychosocial Skills: Coping Strategies   Visit Diagnosis: Muscle weakness (generalized)  Other disturbances of skin sensation  Unsteadiness on feet  Other abnormalities of gait and mobility  Other lack of coordination  Pain in right wrist    Problem List Patient Active Problem List   Diagnosis Date Noted  . CIDP (chronic inflammatory demyelinating polyneuropathy) ([place]) 05/25/2020  . Noncompliance with medication treatment due to difficulty  with dosing 05/25/2020  . Mixed hyperlipidemia 05/25/2020  . Depression 05/25/2020  . DKA, type 1 ([place]) 10/23/2019  . Diabetic polyneuropathy associated with type 1 diabetes mellitus ([place]) 02/26/2019  . Leg weakness, bilateral 02/25/2019  . Postprandial bloating 02/25/2019  . Sensory problems with limbs 02/25/2019  . Ketonuria 11/04/2018  . Diabetes ([place]) 04/23/2018  . Uncontrolled type 1 diabetes mellitus with  hyperglycemia ([place]) 05/24/2017  . Morbid obesity ([place]) 05/24/2017  . Insulin resistance 05/24/2017  . Acanthosis 05/24/2017    Zachery Conch MOT, OTR/L  06/28/2020, 1:59 PM  [facility] [address] [place], [place], [address] Phone: [phone]   Fax:  [phone]  Name: Philo Kurtz MRN: [id number] Date of Birth: 01/28/2001

## 2020-06-29 ENCOUNTER — Telehealth (INDEPENDENT_AMBULATORY_CARE_PROVIDER_SITE_OTHER): Payer: Self-pay | Admitting: Pediatrics

## 2020-06-29 ENCOUNTER — Other Ambulatory Visit: Payer: Self-pay | Admitting: *Deleted

## 2020-06-29 ENCOUNTER — Telehealth: Payer: Self-pay | Admitting: Internal Medicine

## 2020-06-29 NOTE — Telephone Encounter (Signed)
Received message regarding need for orders for DME. Attempted to speak with Lucas Cunningham to discuss recent labs as well as DME orders and medication for his neuropathy. Lucas Cunningham picked up. She requested that I call back at another time.

## 2020-06-29 NOTE — Patient Instructions (Signed)
Visit Information  Mr. Stohr was given information about Medicaid Managed Care team care coordination services as a part of their [facility] Medicaid benefit. Tuan Tippin verbally consented to engagement with the [facility] Managed Care team.   For questions related to your [facility] health plan, please call: [phone]  If you would like to schedule transportation through your [facility] plan, please call the following number at least 2 days in advance of your appointment: [phone].  Call the [facility] at [phone], at any time, 24 hours a day, 7 days a week. If you are in danger or need immediate medical attention call 911.  Mr. Vandenbrink - following are the goals we discussed in your visit today:  Goals Addressed            This Visit's Progress   . Eat Healthy       Timeframe:  Long-Range Goal Priority:  High Start Date:  06/29/20                             Expected End Date:  09/29/20                  Follow Up Date 07/29/20   - adhere to a diabetic diet, eat three small meals a day with protein rich snacks - change to whole grain breads, cereal, pasta - drink 6 to 8 glasses of water each day - limit fast food meals to no more than 1 per week - set a realistic goal - switch to low-fat or skim milk    Why is this important?    When you are ready to manage your nutrition or weight, having a plan and setting goals will help.   Taking small steps to change how you eat and exercise is a good place to start.        . Make and Keep All Appointments       Timeframe:  Long-Range Goal Priority:  High Start Date:   06/29/20                        Expected End Date:  09/29/20                   Follow Up Date 07/29/20   - arrange a ride through an agency 1 week before appointment, call [phone] for medical transportation provided by [facility] -contact [place] for free phone benefits and over-the-counter medication benefits  [phone] - call to cancel if needed - keep a calendar with prescription refill dates - keep a calendar with appointment dates    Why is this important?    Part of staying healthy is seeing the doctor for follow-up care.   If you forget your appointments, there are some things you can do to stay on track.           Please see education materials related to diabetic diet and exercises while sitting provided as print materials.     Exercises to do While Sitting  Exercises that you do while sitting (chair exercises) can give you many of the same benefits as full exercise. Benefits include strengthening your heart, burning calories, and keeping muscles and joints healthy. Exercise can also improve your mood and help with depression and anxiety. You may benefit from chair exercises if you are unable to do standing exercises because of:  Diabetic foot pain.  Obesity.  Illness.  Arthritis.  Recovery from surgery or injury.  Breathing problems.  Balance problems.  Another type of disability. Before starting chair exercises, check with your health care provider or a physical therapist to find out how much exercise you can tolerate and which exercises are safe for you. If your health care provider approves:  Start out slowly and build up over time. Aim to work up to about 10-20 minutes for each exercise session.  Make exercise part of your daily routine.  Drink water when you exercise. Do not wait until you are thirsty. Drink every 10-15 minutes.  Stop exercising right away if you have pain, nausea, shortness of breath, or dizziness.  If you are exercising in a wheelchair, make sure to lock the wheels.  Ask your health care provider whether you can do tai chi or yoga. Many positions in these mind-body exercises can be modified to do while seated. Warm-up Before starting other exercises: 1. Sit up as straight as you can. Have your knees bent at 90 degrees, which is the  shape of the capital letter "L." Keep your feet flat on the floor. 2. Sit at the front edge of your chair, if you can. 3. Pull in (tighten) the muscles in your abdomen and stretch your spine and neck as straight as you can. Hold this position for a few minutes. 4. Breathe in and out evenly. Try to concentrate on your breathing, and relax your mind. Stretching Exercise A: Arm stretch 1. Hold your arms out straight in front of your body. 2. Bend your hands at the wrist with your fingers pointing up, as if signaling someone to stop. Notice the slight tension in your forearms as you hold the position. 3. Keeping your arms out and your hands bent, rotate your hands outward as far as you can and hold this stretch. Aim to have your thumbs pointing up and your pinkie fingers pointing down. Slowly repeat arm stretches for one minute as tolerated. Exercise B: Leg stretch 1. If you can move your legs, try to "draw" letters on the floor with the toes of your foot. Write your name with one foot. 2. Write your name with the toes of your other foot. Slowly repeat the movements for one minute as tolerated. Exercise C: Reach for the sky 1. Reach your hands as far over your head as you can to stretch your spine. 2. Move your hands and arms as if you are climbing a rope. Slowly repeat the movements for one minute as tolerated. Range of motion exercises Exercise A: Shoulder roll 1. Let your arms hang loosely at your sides. 2. Lift just your shoulders up toward your ears, then let them relax back down. 3. When your shoulders feel loose, rotate your shoulders in backward and forward circles. Do shoulder rolls slowly for one minute as tolerated. Exercise B: March in place 1. As if you are marching, pump your arms and lift your legs up and down. Lift your knees as high as you can. ? If you are unable to lift your knees, just pump your arms and move your ankles and feet up and down. March in place for one minute  as tolerated. Exercise C: Seated jumping jacks 1. Let your arms hang down straight. 2. Keeping your arms straight, lift them up over your head. Aim to point your fingers to the ceiling. 3. While you lift your arms, straighten your legs and slide your heels along the floor to your sides, as  wide as you can. 4. As you bring your arms back down to your sides, slide your legs back together. ? If you are unable to use your legs, just move your arms. Slowly repeat seated jumping jacks for one minute as tolerated. Strengthening exercises Exercise A: Shoulder squeeze 1. Hold your arms straight out from your body to your sides, with your elbows bent and your fists pointed at the ceiling. 2. Keeping your arms in the bent position, move them forward so your elbows and forearms meet in front of your face. 3. Open your arms back out as wide as you can with your elbows still bent, until you feel your shoulder blades squeezing together. Hold for 5 seconds. Slowly repeat the movements forward and backward for one minute as tolerated. Contact a health care provider if you:  Had to stop exercising due to any of the following: ? Pain. ? Nausea. ? Shortness of breath. ? Dizziness. ? Fatigue.  Have significant pain or soreness after exercising. Get help right away if you have:  Chest pain.  Difficulty breathing. These symptoms may represent a serious problem that is an emergency. Do not wait to see if the symptoms will go away. Get medical help right away. Call your local emergency services (911 in the [place] Do not drive yourself to the hospital. This information is not intended to replace advice given to you by your health care provider. Make sure you discuss any questions you have with your health care provider. Document Revised: 05/29/2019 Document Reviewed: 05/29/2019 Elsevier Patient Education  2021 [employer].  Diabetes Mellitus and Nutrition, Adult When you have diabetes, or diabetes  mellitus, it is very important to have healthy eating habits because your blood sugar (glucose) levels are greatly affected by what you eat and drink. Eating healthy foods in the right amounts, at about the same times every day, can help you:  Control your blood glucose.  Lower your risk of heart disease.  Improve your blood pressure.  Reach or maintain a healthy weight. What can affect my meal plan? Every person with diabetes is different, and each person has different needs for a meal plan. Your health care provider may recommend that you work with a dietitian to make a meal plan that is best for you. Your meal plan may vary depending on factors such as:  The calories you need.  The medicines you take.  Your weight.  Your blood glucose, blood pressure, and cholesterol levels.  Your activity level.  Other health conditions you have, such as heart or kidney disease. How do carbohydrates affect me? Carbohydrates, also called carbs, affect your blood glucose level more than any other type of food. Eating carbs naturally raises the amount of glucose in your blood. Carb counting is a method for keeping track of how many carbs you eat. Counting carbs is important to keep your blood glucose at a healthy level, especially if you use insulin or take certain oral diabetes medicines. It is important to know how many carbs you can safely have in each meal. This is different for every person. Your dietitian can help you calculate how many carbs you should have at each meal and for each snack. How does alcohol affect me? Alcohol can cause a sudden decrease in blood glucose (hypoglycemia), especially if you use insulin or take certain oral diabetes medicines. Hypoglycemia can be a life-threatening condition. Symptoms of hypoglycemia, such as sleepiness, dizziness, and confusion, are similar to symptoms of having too much  alcohol.  Do not drink alcohol if: ? Your health care provider tells you not to  drink. ? You are pregnant, may be pregnant, or are planning to become pregnant.  If you drink alcohol: ? Do not drink on an empty stomach. ? Limit how much you use to:  0-1 drink a day for women.  0-2 drinks a day for men. ? Be aware of how much alcohol is in your drink. In the [place] one drink equals one 12 oz bottle of beer (355 mL), one 5 oz glass of wine (148 mL), or one 1 oz glass of hard liquor (44 mL). ? Keep yourself hydrated with water, diet soda, or unsweetened iced tea.  Keep in mind that regular soda, juice, and other mixers may contain a lot of sugar and must be counted as carbs. What are tips for following this plan? Reading food labels  Start by checking the serving size on the "Nutrition Facts" label of packaged foods and drinks. The amount of calories, carbs, fats, and other nutrients listed on the label is based on one serving of the item. Many items contain more than one serving per package.  Check the total grams (g) of carbs in one serving. You can calculate the number of servings of carbs in one serving by dividing the total carbs by 15. For example, if a food has 30 g of total carbs per serving, it would be equal to 2 servings of carbs.  Check the number of grams (g) of saturated fats and trans fats in one serving. Choose foods that have a low amount or none of these fats.  Check the number of milligrams (mg) of salt (sodium) in one serving. Most people should limit total sodium intake to less than 2,300 mg per day.  Always check the nutrition information of foods labeled as "low-fat" or "nonfat." These foods may be higher in added sugar or refined carbs and should be avoided.  Talk to your dietitian to identify your daily goals for nutrients listed on the label. Shopping  Avoid buying canned, pre-made, or processed foods. These foods tend to be high in fat, sodium, and added sugar.  Shop around the outside edge of the grocery store. This is where you will most  often find fresh fruits and vegetables, bulk grains, fresh meats, and fresh dairy. Cooking  Use low-heat cooking methods, such as baking, instead of high-heat cooking methods like deep frying.  Cook using healthy oils, such as olive, canola, or sunflower oil.  Avoid cooking with butter, cream, or high-fat meats. Meal planning  Eat meals and snacks regularly, preferably at the same times every day. Avoid going long periods of time without eating.  Eat foods that are high in fiber, such as fresh fruits, vegetables, beans, and whole grains. Talk with your dietitian about how many servings of carbs you can eat at each meal.  Eat 4-6 oz (112-168 g) of lean protein each day, such as lean meat, chicken, fish, eggs, or tofu. One ounce (oz) of lean protein is equal to: ? 1 oz (28 g) of meat, chicken, or fish. ? 1 egg. ?  cup (62 g) of tofu.  Eat some foods each day that contain healthy fats, such as avocado, nuts, seeds, and fish.   What foods should I eat? Fruits Berries. Apples. Oranges. Peaches. Apricots. Plums. Grapes. Mango. Papaya. Pomegranate. Kiwi. Cherries. Vegetables Lettuce. Spinach. Leafy greens, including kale, chard, collard greens, and mustard greens. Beets. Cauliflower. Cabbage. Broccoli. Carrots.  Green beans. Tomatoes. Peppers. Onions. Cucumbers. Brussels sprouts. Grains Whole grains, such as whole-wheat or whole-grain bread, crackers, tortillas, cereal, and pasta. Unsweetened oatmeal. Quinoa. Brown or wild rice. Meats and other proteins Seafood. Poultry without skin. Lean cuts of poultry and beef. Tofu. Nuts. Seeds. Dairy Low-fat or fat-free dairy products such as milk, yogurt, and cheese. The items listed above may not be a complete list of foods and beverages you can eat. Contact a dietitian for more information. What foods should I avoid? Fruits Fruits canned with syrup. Vegetables Canned vegetables. Frozen vegetables with butter or cream sauce. Grains Refined white  flour and flour products such as bread, pasta, snack foods, and cereals. Avoid all processed foods. Meats and other proteins Fatty cuts of meat. Poultry with skin. Breaded or fried meats. Processed meat. Avoid saturated fats. Dairy Full-fat yogurt, cheese, or milk. Beverages Sweetened drinks, such as soda or iced tea. The items listed above may not be a complete list of foods and beverages you should avoid. Contact a dietitian for more information. Questions to ask a health care provider  Do I need to meet with a diabetes educator?  Do I need to meet with a dietitian?  What number can I call if I have questions?  When are the best times to check my blood glucose? Where to find more information:  American Diabetes Association: diabetes.org  Academy of Nutrition and Dietetics: www.eatright.[employer] of Diabetes and Digestive and Kidney Diseases: [employer]  Association of Diabetes Care and Education Specialists: www.diabeteseducator.org Summary  It is important to have healthy eating habits because your blood sugar (glucose) levels are greatly affected by what you eat and drink.  A healthy meal plan will help you control your blood glucose and maintain a healthy lifestyle.  Your health care provider may recommend that you work with a dietitian to make a meal plan that is best for you.  Keep in mind that carbohydrates (carbs) and alcohol have immediate effects on your blood glucose levels. It is important to count carbs and to use alcohol carefully. This information is not intended to replace advice given to you by your health care provider. Make sure you discuss any questions you have with your health care provider. Document Revised: 01/07/2019 Document Reviewed: 01/07/2019 Elsevier Patient Education  2021 [employer].   The patient verbalized understanding of instructions provided today and agreed to receive a mailed copy of patient instruction and/or  educational materials.  Telephone follow up appointment with Managed Medicaid care management team member scheduled for:07/29/20 @ [place] RN, BSN [facility] RN Care Coordinator   Following is a copy of your plan of care:  Patient Care Plan: General Plan of Care (Adult)    Problem Identified: Interdisciplinary Collaborative/Care Coordination   Priority: High  Onset Date: 06/11/2020    Long-Range Goal: Patient-Specific Goal   Start Date: 06/11/2020  Expected End Date: 09/10/2020  This Visit's Progress: On track  Priority: High  Note:   Current Barriers:  . Care Management support, education, and care coordination needs related to DM Case Manager Clinical Goal(s):  . patient will work with BSW to address needs related to Level of care concerns and ADL IADL limitations in patient with DM Interventions:  . Collaborated with BSW to initiate plan of care to address needs related to Level of care concerns and ADL IADL limitations in patient with DM Self Care Activities:  . Patient will attend all scheduled provider  appointments . Patient will work with BSW to address care coordination needs and will continue to work with the clinical team to address health care and disease management related needs.   Patient Goals: - schedule and keep appointment for annual check-up  - work with Ubaldo Glassing, BSW for assistance with arranging [facility] for healthcare management Follow up plan:  The Managed Medicaid care management team will reach out to the patient again over the next 14 days.     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  Provided education to patient re: diabetic diet and exercises while sitting  Reviewed medications with patient and discussed previously ordered gabapentin  Collaborated with PCP regarding DME  Discussed plans with patient for ongoing care management follow up and provided patient with direct contact information for care management team  Reviewed scheduled/upcoming provider appointments including: calling to schedule follow up with PCP and scheduling appointment with [facility]) [place], [place], [place] [address] Phone: [phone]   Social Work referral for assisting patient with feelings associated with the loss of his mother Self Care Activities:  . Patient will self administer medications as prescribed . Patient will attend all scheduled provider appointments . Patient will call pharmacy for medication refills . Patient will call provider office for new concerns or questions Patient Goals: - adhere to a diabetic diet, eat three small meals a day with protein rich snacks - change to whole grain breads, cereal, pasta - drink 6 to 8 glasses of water each day - limit fast food meals to no more than 1 per week - set a realistic goal - switch to low-fat or skim milk  - arrange a ride through an agency 1 week before appointment, call [phone] for medical transportation provided by [facility] -contact [place] for free phone benefits and  over-the-counter medication benefits [phone] - call to cancel if needed - keep a calendar with prescription refill dates - keep a calendar with appointment dates  Follow Up Plan: Telephone follow up appointment with care management team member scheduled for:07/29/20 @ 1pm    Patient Care Plan: General Social Work (Adult)    Problem Identified: HH Services   Onset Date: 06/08/2020  Note:   Abrahan Fulmore is a 20 y.o. year old male who sees Skinner-Kiser, Tor Netters, NP for primary care. [facility] Managed Care team was consulted for assistance with Limited access to food ADL IADL limitations. Mr. Candelas was given information about Care Management services, agreed to services, and verbal consent for services was obtained.  Interventions:  . Patient interviewed and appropriate assessments performed . Collaborated with clinical team regarding patient needs  . SDOH (Social  Determinants of Health) assessments performed: Yes .     Marland Kitchen Collaborated with [facility] for [facility] referral . BSW contacted patient and spoke with grandmother. She stated that due to patients neuropathy it makes it difficult for him to do a lot of things on his own. Grandmother states he does need assistance with bathing, with the braces on his legs it makes it difficult for him. Patient keeps a jug in his room for toileting. Grandmother states she does the best she can, but assistance is needed. BSW contacted [facility] and spoke with Jenny Reichmann, a referral for [facility] was submitted and [facility] will reach out to patient to complete an assessment.  Cory Roughen stated she has applied for disability for patient but has not received an answer yet, grandmother is the only income in the household and food is getting hard to keep in the home. Grandmother states she has completed an application for foodstamps for patient and is waiting for a response. BSW provided grandmother with food pantry resources.  Marland Kitchen Update 06/28/20: BSW contacted  patient and spoke with patient and grandmother. They both stated that they have not heard anything from [facility] about getting an assessment completed. BSW will contact [facility] to check on referral. Patient stated that they have started receiving foodstamps and have not heard anything about disabliity.   Plan:  . Over the next 30-60 days, patient will work with BSW to address needs related to ADL IADL limitations . Social Worker will follow up in 14 days.   Mickel Fuchs, BSW, [place]  High Risk Managed Medicaid Team

## 2020-06-29 NOTE — Patient Outreach (Signed)
Medicaid Managed Care   Nurse Care Manager Note  06/29/2020 Name:  Lucas Cunningham MRN:  [id number] DOB:  December 17, 2000  Lucas Cunningham is an 20 y.o. year old male who is a primary patient of Amponsah, Charisse March, MD.  The Medicaid Managed Care Coordination team was consulted for assistance with:    [name] CIDP  Lucas Cunningham was given information about Medicaid Managed Care Coordination team services today. Lucas Cunningham agreed to services and verbal consent obtained.  Engaged with patient by telephone for initial visit in response to provider referral for case management and/or care coordination services.   Assessments/Interventions:  Review of past medical history, allergies, medications, health status, including review of consultants reports, laboratory and other test data, was performed as part of comprehensive evaluation and provision of chronic care management services.  SDOH (Social Determinants of Health) assessments and interventions performed:   Care Plan  No Known Allergies  Medications Reviewed Today    Reviewed by Melissa Montane, RN (Registered Nurse) on 06/29/20 at 1237  Med List Status: <None>  Medication Order Taking? Sig Documenting Provider Last Dose Status Informant  Accu-Chek FastClix Lancets MISC [id number] Yes 1 Device by Does not apply route as directed. Use with Fastclix lancet device to check blood sugar up to 8x daily. (diagnosis code E10.65) Sherrlyn Hock, MD Taking Active   acetone, urine, test strip [id number] Yes 1 strip by Does not apply route as needed for high blood sugar. Up to 8x daily if necessary Sherrlyn Hock, MD Taking Active   atorvastatin (LIPITOR) 10 MG tablet [id number] Yes Take 1 tablet by mouth daily. [provider] Taking Active   Continuous Blood Gluc Receiver ([place]) DEVI [id number] Yes 1 Device by Does not apply route as directed. Sherrlyn Hock, MD Taking Active   Continuous Blood Gluc Receiver [facility] G6 RECEIVER)  [name] [id number] Yes USE 1 DEVICE AS DIRECTED. Sherrlyn Hock, MD Taking Active   Continuous Blood Gluc Sensor (DEXCOM G6 SENSOR) MISC [id number] Yes Inject 1 applicator into the skin as directed. (change sensor every 10 days) Sherrlyn Hock, MD Taking Active   Continuous Blood Gluc Sensor (DEXCOM G6 SENSOR) MISC [id number] Yes INJECT 1 APPLICATOR INTO THE SKIN AS DIRECTED. (CHANGE SENSOR EVERY 10 DAYS) Sherrlyn Hock, MD Taking Active   Continuous Blood Gluc Transmit (DEXCOM G6 TRANSMITTER) MISC [id number] Yes Inject 1 Device into the skin as directed. (re-use up to 8x with each new sensor) Sherrlyn Hock, MD Taking Active   Continuous Blood Gluc Transmit (DEXCOM G6 TRANSMITTER) MISC [id number] Yes INJECT 1 DEVICE INTO THE SKIN AS DIRECTED. (RE-USE UP TO 8X WITH EACH NEW SENSOR) Sherrlyn Hock, MD Taking Active   Dulaglutide (TRULICITY) 9.67 EL/3.8BO Bonney Aid [id number] Yes Inject 0.75 mg into the skin once a week. Al Corpus, MD Taking Active   gabapentin (NEURONTIN) 300 MG capsule [id number] No Take 1 capsule (300 mg total) by mouth 2 (two) times daily.  Patient not taking: No sig reported   Teressa Lower, MD Not Taking Active            Med Note ([name]   Tue Jun 29, 2020 11:54 AM) Never picked up this medicaiton  Glucagon (BAQSIMI TWO PACK) 3 MG/DOSE POWD [id number] Yes Place 1 spray into the nose as directed. Sherrlyn Hock, MD Taking Active   Glucose Blood (BLOOD GLUCOSE TEST STRIPS) STRP [id number] Yes 1 Device by Other route as directed. To check blood sugar up to  8x per day Sherrlyn Hock, MD Taking Active   insulin degludec (TRESIBA FLEXTOUCH) 200 UNIT/ML FlexTouch Pen [id number] Yes Inject 100 Units into the skin at bedtime. Al Corpus, MD Taking Active   insulin lispro (HUMALOG KWIKPEN) 200 UNIT/ML KwikPen [id number] Yes Inject up to 100 units daily per provider instructions Sherrlyn Hock, MD Taking Active   Insulin Pen Needle (BD PEN NEEDLE NANO U/F)  32G X 4 MM MISC [id number] Yes Inject up to 8 times per day. Al Corpus, MD Taking Active   Insulin Regular Human [facility]) 4 & 8 & 12 units POWD [id number] No Inhale up to 96 units daily  Patient not taking: No sig reported   Sherrlyn Hock, MD Not Taking Active   Insulin Regular Human 4 & 8 & 12 units POWD [id number] No INHALE UP TO 96 UNITS DAILY  Patient not taking: No sig reported   Sherrlyn Hock, MD Not Taking Active   ketoconazole (NIZORAL) 2 % cream [id number] Yes Apply to fet daily. Sherrlyn Hock, MD Taking Active   Lancets Misc. [facility] FASTCLIX LANCET) KIT [id number] Yes 1 Device by Does not apply route as directed. Use with lancets to check blood sugar up to 8x daily.  (diagnosis code E10.65)  Patient taking differently: 1 Device by Does not apply route as directed. Use with lancets to check blood sugar up to 8x daily.  (diagnosis code E10.65)   Sherrlyn Hock, MD Taking Active   ondansetron (ZOFRAN ODT) 8 MG disintegrating tablet [id number] No Take 1 tablet (8 mg total) by mouth every 8 (eight) hours as needed for nausea or vomiting.  Patient not taking: No sig reported   Al Corpus, MD Not Taking Active   ondansetron (ZOFRAN-ODT) 8 MG disintegrating tablet [id number] No TAKE 1 TABLET (8 MG TOTAL) BY MOUTH EVERY 8 (EIGHT) HOURS AS NEEDED FOR NAUSEA OR VOMITING.  Patient not taking: No sig reported   Al Corpus, MD Not Taking Active   PRESCRIPTION MEDICATION [id number] Yes Inject into the vein. IVIG every 4 weeks. Order for new start sent to [place] on 04/08/2020. Melvenia Beam, MD Taking Active           Patient Active Problem List   Diagnosis Date Noted  . CIDP (chronic inflammatory demyelinating polyneuropathy) ([place]) 05/25/2020  . Noncompliance with medication treatment due to difficulty with dosing 05/25/2020  . Mixed hyperlipidemia 05/25/2020  . Depression 05/25/2020  . DKA, type 1 ([place]) 10/23/2019  . Diabetic polyneuropathy  associated with type 1 diabetes mellitus ([place]) 02/26/2019  . Leg weakness, bilateral 02/25/2019  . Postprandial bloating 02/25/2019  . Sensory problems with limbs 02/25/2019  . Ketonuria 11/04/2018  . Diabetes ([place]) 04/23/2018  . Uncontrolled type 1 diabetes mellitus with hyperglycemia ([place]) 05/24/2017  . Morbid obesity ([place]) 05/24/2017  . Insulin resistance 05/24/2017  . Acanthosis 05/24/2017    Conditions to be addressed/monitored per PCP order:  [name] and CIDP  Care Plan : General Plan of Care (Adult)  Updates made by Melissa Montane, RN since 06/29/2020 12:00 AM    Problem: Health Promotion or Disease Self-Management (General Plan of Care)     Long-Range Goal: Self-Management Plan Developed   Start Date: 06/29/2020  Expected End Date: 09/29/2020  This Visit's Progress: On track  Priority: High  Note:   Current Barriers:   Ineffective Self Health Maintenance  Unable to independently manage [name] and CIDP  Currently UNABLE TO independently self manage needs related to chronic health  conditions.   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  Provided education to patient re: diabetic diet and exercises while sitting  Reviewed medications with patient and discussed previously ordered gabapentin  Collaborated with PCP regarding DME  Discussed plans with patient for ongoing care management follow up and provided patient with direct contact information for care management team  Reviewed scheduled/upcoming provider appointments including: calling to schedule follow up with PCP and  scheduling appointment with [facility] [place], [place], [place] [address] Phone: [phone]   Social Work referral for assisting patient with feelings associated with the loss of his mother Self Care Activities:  . Patient will self administer medications as prescribed . Patient will attend all scheduled provider appointments . Patient will call pharmacy for medication refills . Patient will call provider office for new concerns or questions Patient Goals: - adhere to a diabetic diet, eat three small meals a day with protein rich snacks - change to whole grain breads, cereal, pasta - drink 6 to 8 glasses of water each day - limit fast food meals to no more than 1 per week - set a realistic goal - switch to low-fat or skim milk  - arrange a ride through an agency 1 week before appointment, call [phone] for medical transportation provided by [facility] -contact [place] for free phone benefits and over-the-counter medication benefits [phone] - call to cancel if needed - keep a calendar with prescription refill dates - keep a calendar with appointment dates  Follow Up Plan: Telephone follow up appointment with care management team member scheduled for:07/29/20 @ 1pm      Follow Up:  Patient agrees to Care Plan and Follow-up.  Plan: The Managed Medicaid care management team will reach out to the patient again over the next 30 days.  Date/time of next scheduled RN care management/care coordination outreach: 07/29/20 @ 1pm  Lurena Joiner RN, [place] RN Care Coordinator

## 2020-06-29 NOTE — Telephone Encounter (Signed)
Called [facility] to follow up, they do have the script but they must call each month to ask for a refill. She did submit to fill it for today.    Called grandma to update, left HIPAA approved voicemail to return phone call and that pharmacy is refilling the medication.

## 2020-06-29 NOTE — Telephone Encounter (Signed)
  Who's calling (name and relationship to patient) : Consulting civil engineer  Best contact number: [phone]  Provider they see: Dr. Quincy Sheehan   Reason for call: went to pick up prescription and pharmacy saying that there is no prescription for the patient to receive this medication. Please advise     PRESCRIPTION REFILL ONLY  Name of prescription: Trulicity   Pharmacy:[facility] [address] [employer]

## 2020-06-30 ENCOUNTER — Other Ambulatory Visit: Payer: Self-pay

## 2020-06-30 NOTE — Patient Outreach (Signed)
I have tried to call patient 3 times and left my name/# on VM. Will stop calling, will f/u with patient if they call back and schedule something.

## 2020-07-01 ENCOUNTER — Encounter: Payer: Self-pay | Admitting: *Deleted

## 2020-07-02 ENCOUNTER — Telehealth: Payer: Self-pay | Admitting: Internal Medicine

## 2020-07-02 ENCOUNTER — Other Ambulatory Visit: Payer: Self-pay | Admitting: Licensed Clinical Social Worker

## 2020-07-02 DIAGNOSIS — G6181 Chronic inflammatory demyelinating polyneuritis: Secondary | ICD-10-CM

## 2020-07-02 MED ORDER — GABAPENTIN 300 MG PO CAPS: 300.0000 mg | ORAL_CAPSULE | Freq: Two times a day (BID) | ORAL | 5 refills | Status: DC

## 2020-07-02 NOTE — Telephone Encounter (Signed)
Spoke with Lucas Cunningham regarding his DME needs. He mentions he does not have his own walker and he has been using his grandmother's walker. He also requests refill on his gabapentin. Discussed h is recent labs. He mentions resuming his Humalog with improvement in his blood sugars down to low 200s. All other questions and concerns addressed.

## 2020-07-02 NOTE — Patient Instructions (Signed)
Visit Information  Lucas Cunningham was given information about Medicaid Managed Care team care coordination services as a part of their [facility] Medicaid benefit. [name] verbally consented to engagement with the [facility] Managed Care team.   For questions related to your [facility] health plan, please call: [phone] or go here:[email or web address]  If you would like to schedule transportation through your [facility] plan, please call the following number at least 2 days in advance of your appointment: [phone].  Call the [facility] Crisis Line at [phone], at any time, 24 hours a day, 7 days a week. If you are in danger or need immediate medical attention call 911.  Dickie La, BSW, MSW, [employer] Managed Medicaid LCSW [facility]  [facility] [email or web address] Phone: [phone]

## 2020-07-02 NOTE — Patient Outreach (Signed)
Medicaid Managed Care Social Work Note  07/02/2020 Name:  Lucas Cunningham MRN:  [id number] DOB:  2000-07-08  Lucas Cunningham is an 20 y.o. year old male who is a primary patient of Amponsah, Charisse March, MD.  The Medicaid Managed Care Coordination team was consulted for assistance with:  [place] [place] [facility] Grief Counseling  Lucas Cunningham was given information about Medicaid Managed CareCoordination services today. Lucas Cunningham agreed to services and verbal consent obtained.  Engaged with patient  for by telephone forinitial visit in response to referral for case management and/or care coordination services.   Assessments/Interventions:  Review of past medical history, allergies, medications, health status, including review of consultants reports, laboratory and other test data, was performed as part of comprehensive evaluation and provision of chronic care management services.  SDOH: (Social Determinant of Health) assessments and interventions performed: SDOH Interventions   Flowsheet Row Most Recent Value  SDOH Interventions   SDOH Interventions for the Following Domains Depression  Depression Interventions/Treatment  Patient refuses Treatment      Advanced Directives Status:  Not addressed in this encounter.  Care Plan                 No Known Allergies  Medications Reviewed Today    Reviewed by Lucas Montane, RN (Registered Nurse) on 06/29/20 at 1237  Med List Status: <None>  Medication Order Taking? Sig Documenting Provider Last Dose Status Informant  Accu-Chek FastClix Lancets MISC [id number] Yes 1 Device by Does not apply route as directed. Use with Fastclix lancet device to check blood sugar up to 8x daily. (diagnosis code E10.65) Sherrlyn Hock, MD Taking Active   acetone, urine, test strip [id number] Yes 1 strip by Does not apply route as needed for high blood sugar. Up to 8x daily if necessary Sherrlyn Hock, MD Taking Active   atorvastatin (LIPITOR) 10  MG tablet [id number] Yes Take 1 tablet by mouth daily. [provider] Taking Active   Continuous Blood Gluc Receiver ([place]) DEVI [id number] Yes 1 Device by Does not apply route as directed. Sherrlyn Hock, MD Taking Active   Continuous Blood Gluc Receiver [facility] G6 RECEIVER) [name] [id number] Yes USE 1 DEVICE AS DIRECTED. Sherrlyn Hock, MD Taking Active   Continuous Blood Gluc Sensor (DEXCOM G6 SENSOR) MISC [id number] Yes Inject 1 applicator into the skin as directed. (change sensor every 10 days) Sherrlyn Hock, MD Taking Active   Continuous Blood Gluc Sensor (DEXCOM G6 SENSOR) MISC [id number] Yes INJECT 1 APPLICATOR INTO THE SKIN AS DIRECTED. (CHANGE SENSOR EVERY 10 DAYS) Sherrlyn Hock, MD Taking Active   Continuous Blood Gluc Transmit (DEXCOM G6 TRANSMITTER) MISC [id number] Yes Inject 1 Device into the skin as directed. (re-use up to 8x with each new sensor) Sherrlyn Hock, MD Taking Active   Continuous Blood Gluc Transmit (DEXCOM G6 TRANSMITTER) MISC [id number] Yes INJECT 1 DEVICE INTO THE SKIN AS DIRECTED. (RE-USE UP TO 8X WITH EACH NEW SENSOR) Sherrlyn Hock, MD Taking Active   Dulaglutide (TRULICITY) 3.01 TH/4.3OO Bonney Aid [id number] Yes Inject 0.75 mg into the skin once a week. Al Corpus, MD Taking Active   gabapentin (NEURONTIN) 300 MG capsule [id number] No Take 1 capsule (300 mg total) by mouth 2 (two) times daily.  Patient not taking: No sig reported   Teressa Lower, MD Not Taking Active            Med Note Lucas Cunningham   Tue Jun 29, 2020 11:54  AM) Never picked up this medicaiton  Glucagon (BAQSIMI TWO PACK) 3 MG/DOSE POWD [id number] Yes Place 1 spray into the nose as directed. Sherrlyn Hock, MD Taking Active   Glucose Blood (BLOOD GLUCOSE TEST STRIPS) STRP [id number] Yes 1 Device by Other route as directed. To check blood sugar up to 8x per day Sherrlyn Hock, MD Taking Active   insulin degludec (TRESIBA FLEXTOUCH) 200 UNIT/ML  FlexTouch Pen [id number] Yes Inject 100 Units into the skin at bedtime. Al Corpus, MD Taking Active   insulin lispro (HUMALOG KWIKPEN) 200 UNIT/ML KwikPen [id number] Yes Inject up to 100 units daily per provider instructions Sherrlyn Hock, MD Taking Active   Insulin Pen Needle (BD PEN NEEDLE NANO U/F) 32G X 4 MM MISC [id number] Yes Inject up to 8 times per day. Al Corpus, MD Taking Active   Insulin Regular Human [facility]) 4 & 8 & 12 units POWD [id number] No Inhale up to 96 units daily  Patient not taking: No sig reported   Sherrlyn Hock, MD Not Taking Active   Insulin Regular Human 4 & 8 & 12 units POWD [id number] No INHALE UP TO 96 UNITS DAILY  Patient not taking: No sig reported   Sherrlyn Hock, MD Not Taking Active   ketoconazole (NIZORAL) 2 % cream [id number] Yes Apply to fet daily. Sherrlyn Hock, MD Taking Active   Lancets Misc. [facility] FASTCLIX LANCET) KIT [id number] Yes 1 Device by Does not apply route as directed. Use with lancets to check blood sugar up to 8x daily.  (diagnosis code E10.65)  Patient taking differently: 1 Device by Does not apply route as directed. Use with lancets to check blood sugar up to 8x daily.  (diagnosis code E10.65)   Sherrlyn Hock, MD Taking Active   ondansetron (ZOFRAN ODT) 8 MG disintegrating tablet [id number] No Take 1 tablet (8 mg total) by mouth every 8 (eight) hours as needed for nausea or vomiting.  Patient not taking: No sig reported   Al Corpus, MD Not Taking Active   ondansetron (ZOFRAN-ODT) 8 MG disintegrating tablet [id number] No TAKE 1 TABLET (8 MG TOTAL) BY MOUTH EVERY 8 (EIGHT) HOURS AS NEEDED FOR NAUSEA OR VOMITING.  Patient not taking: No sig reported   Al Corpus, MD Not Taking Active   PRESCRIPTION MEDICATION [id number] Yes Inject into the vein. IVIG every 4 weeks. Order for new start sent to [place] on 04/08/2020. Melvenia Beam, MD Taking Active           Patient Active Problem  List   Diagnosis Date Noted  . CIDP (chronic inflammatory demyelinating polyneuropathy) ([place]) 05/25/2020  . Noncompliance with medication treatment due to difficulty with dosing 05/25/2020  . Mixed hyperlipidemia 05/25/2020  . Depression 05/25/2020  . DKA, type 1 ([place]) 10/23/2019  . Diabetic polyneuropathy associated with type 1 diabetes mellitus ([place]) 02/26/2019  . Leg weakness, bilateral 02/25/2019  . Postprandial bloating 02/25/2019  . Sensory problems with limbs 02/25/2019  . Ketonuria 11/04/2018  . Diabetes ([place]) 04/23/2018  . Uncontrolled type 1 diabetes mellitus with hyperglycemia ([place]) 05/24/2017  . Morbid obesity ([place]) 05/24/2017  . Insulin resistance 05/24/2017  . Acanthosis 05/24/2017    [facility] LCSW received referral from [place] stating that patient is in need of mental health resource connection as his mother passed away last 17-Oct-2022 and his grandmother is concerned about him being depressed. Patient was provided education on available grief, talk therapy and medication management options. Patient was provided brief  community resource education regarding where he can receive mental health support. However, patient reports that he does wish to work on his grief or depression at this time but was very [employer] [facility] LCSW's call and resource education. [facility] LCSW updated [place] RNCM and [place] BSW. [facility] LCSW will close referral at this time as patient refuses treatment. Patient was provided [facility] LCSW's contact information in the case that he changes his mind and wants mental health and/or grief support assistance. Patient is agreeable to contact [facility] LCSW directly if he wishes to gain treatment.    Depression screen [facility] 2/9 07/02/2020 06/24/2020  Decreased Interest 0 0  Down, Depressed, Hopeless 1 1  PHQ - 2 Score 1 1  Altered sleeping 0 0  Tired, decreased energy 0 0  Change in appetite 1 2  Feeling bad or failure about yourself  1 1  Trouble concentrating 1 1  Moving slowly or  fidgety/restless 0 0  Suicidal thoughts 0 0  PHQ-9 Score 4 5  Difficult doing work/chores Not difficult at all Not difficult at all   There are no care plans that you recently modified to display for this patient.   Follow up:  Patient requests no follow-up at this time.  Plan: The patient has been provided with contact information for the Managed Medicaid care management team and has been advised to call with any health related questions or concerns. and The Managed Medicaid LCSW is available to follow up with the patient after provider conversation with the patient regarding recommendation for care management engagement and subsequent re-referral to the care management team.   Eula Fried, BSW, MSW, LCSW Managed Medicaid [email or web address] [email or web address] Phone: [phone]

## 2020-07-14 ENCOUNTER — Institutional Professional Consult (permissible substitution) (INDEPENDENT_AMBULATORY_CARE_PROVIDER_SITE_OTHER): Payer: Medicaid Other | Admitting: Psychology

## 2020-07-16 ENCOUNTER — Other Ambulatory Visit: Payer: Self-pay

## 2020-07-16 ENCOUNTER — Telehealth (INDEPENDENT_AMBULATORY_CARE_PROVIDER_SITE_OTHER): Payer: Self-pay | Admitting: Pharmacist

## 2020-07-16 NOTE — Patient Outreach (Signed)
Medicaid Managed Care Social Work Note  07/16/2020 Name:  Lucas Cunningham MRN:  [id number] DOB:  Jan 17, 2001  Lucas Cunningham is an 20 y.o. year old male who is a primary patient of Amponsah, Charisse March, MD.  The Medicaid Managed Care Coordination team was consulted for assistance with:  [facility]   Lucas Cunningham was given information about Medicaid Managed CareCoordination services today. Lucas Cunningham agreed to services and verbal consent obtained.  Engaged with patient  for by telephone forfollow up visit in response to referral for case management and/or care coordination services.   Assessments/Interventions:  Review of past medical history, allergies, medications, health status, including review of consultants reports, laboratory and other test data, was performed as part of comprehensive evaluation and provision of chronic care management services.  SDOH: (Social Determinant of Health) assessments and interventions performed:  BSW spoke with patients grandmother today. She stated they still have not heard anything from disabliity nor [employer]. BSW contact [employer] and did not get a rep. BSW will continue to contact for PCS referral. Patient's mother wanted information on being paid to take care of patient.   Advanced Directives Status:  Not addressed in this encounter.  Care Plan                 No Known Allergies  Medications Reviewed Today    Reviewed by Melissa Montane, RN (Registered Nurse) on 06/29/20 at 1237  Med List Status: <None>  Medication Order Taking? Sig Documenting Provider Last Dose Status Informant  Accu-Chek FastClix Lancets MISC [id number] Yes 1 Device by Does not apply route as directed. Use with Fastclix lancet device to check blood sugar up to 8x daily. (diagnosis code E10.65) Sherrlyn Hock, MD Taking Active   acetone, urine, test strip [id number] Yes 1 strip by Does not apply route as needed for high blood sugar. Up to 8x daily if necessary Sherrlyn Hock, MD Taking  Active   atorvastatin (LIPITOR) 10 MG tablet [id number] Yes Take 1 tablet by mouth daily. [provider] Taking Active   Continuous Blood Gluc Receiver ([place]) DEVI [id number] Yes 1 Device by Does not apply route as directed. Sherrlyn Hock, MD Taking Active   Continuous Blood Gluc Receiver [facility] G6 RECEIVER) [name] [id number] Yes USE 1 DEVICE AS DIRECTED. Sherrlyn Hock, MD Taking Active   Continuous Blood Gluc Sensor (DEXCOM G6 SENSOR) MISC [id number] Yes Inject 1 applicator into the skin as directed. (change sensor every 10 days) Sherrlyn Hock, MD Taking Active   Continuous Blood Gluc Sensor (DEXCOM G6 SENSOR) MISC [id number] Yes INJECT 1 APPLICATOR INTO THE SKIN AS DIRECTED. (CHANGE SENSOR EVERY 10 DAYS) Sherrlyn Hock, MD Taking Active   Continuous Blood Gluc Transmit (DEXCOM G6 TRANSMITTER) MISC [id number] Yes Inject 1 Device into the skin as directed. (re-use up to 8x with each new sensor) Sherrlyn Hock, MD Taking Active   Continuous Blood Gluc Transmit (DEXCOM G6 TRANSMITTER) MISC [id number] Yes INJECT 1 DEVICE INTO THE SKIN AS DIRECTED. (RE-USE UP TO 8X WITH EACH NEW SENSOR) Sherrlyn Hock, MD Taking Active   Dulaglutide (TRULICITY) 4.49 PN/3.0YF Bonney Aid [id number] Yes Inject 0.75 mg into the skin once a week. Al Corpus, MD Taking Active   gabapentin (NEURONTIN) 300 MG capsule [id number] No Take 1 capsule (300 mg total) by mouth 2 (two) times daily.  Patient not taking: No sig reported   Teressa Lower, MD Not Taking Active  Med Note (ROBB, MELANIE A   Tue Jun 29, 2020 11:54 AM) Never picked up this medicaiton  Glucagon (BAQSIMI TWO PACK) 3 MG/DOSE POWD [id number] Yes Place 1 spray into the nose as directed. Sherrlyn Hock, MD Taking Active   Glucose Blood (BLOOD GLUCOSE TEST STRIPS) STRP [id number] Yes 1 Device by Other route as directed. To check blood sugar up to 8x per day Sherrlyn Hock, MD Taking Active   insulin degludec  (TRESIBA FLEXTOUCH) 200 UNIT/ML FlexTouch Pen [id number] Yes Inject 100 Units into the skin at bedtime. Al Corpus, MD Taking Active   insulin lispro (HUMALOG KWIKPEN) 200 UNIT/ML KwikPen [id number] Yes Inject up to 100 units daily per provider instructions Sherrlyn Hock, MD Taking Active   Insulin Pen Needle (BD PEN NEEDLE NANO U/F) 32G X 4 MM MISC [id number] Yes Inject up to 8 times per day. Al Corpus, MD Taking Active   Insulin Regular Human [facility]) 4 & 8 & 12 units POWD [id number] No Inhale up to 96 units daily  Patient not taking: No sig reported   Sherrlyn Hock, MD Not Taking Active   Insulin Regular Human 4 & 8 & 12 units POWD [id number] No INHALE UP TO 96 UNITS DAILY  Patient not taking: No sig reported   Sherrlyn Hock, MD Not Taking Active   ketoconazole (NIZORAL) 2 % cream [id number] Yes Apply to fet daily. Sherrlyn Hock, MD Taking Active   Lancets Misc. [facility] FASTCLIX LANCET) KIT [id number] Yes 1 Device by Does not apply route as directed. Use with lancets to check blood sugar up to 8x daily.  (diagnosis code E10.65)  Patient taking differently: 1 Device by Does not apply route as directed. Use with lancets to check blood sugar up to 8x daily.  (diagnosis code E10.65)   Sherrlyn Hock, MD Taking Active   ondansetron (ZOFRAN ODT) 8 MG disintegrating tablet [id number] No Take 1 tablet (8 mg total) by mouth every 8 (eight) hours as needed for nausea or vomiting.  Patient not taking: No sig reported   Al Corpus, MD Not Taking Active   ondansetron (ZOFRAN-ODT) 8 MG disintegrating tablet [id number] No TAKE 1 TABLET (8 MG TOTAL) BY MOUTH EVERY 8 (EIGHT) HOURS AS NEEDED FOR NAUSEA OR VOMITING.  Patient not taking: No sig reported   Al Corpus, MD Not Taking Active   PRESCRIPTION MEDICATION [id number] Yes Inject into the vein. IVIG every 4 weeks. Order for new start sent to [facility] on 04/08/2020. Melvenia Beam, MD Taking Active            Patient Active Problem List   Diagnosis Date Noted  . CIDP (chronic inflammatory demyelinating polyneuropathy) ([place]) 05/25/2020  . Noncompliance with medication treatment due to difficulty with dosing 05/25/2020  . Mixed hyperlipidemia 05/25/2020  . Depression 05/25/2020  . DKA, type 1 ([place]) 10/23/2019  . Diabetic polyneuropathy associated with type 1 diabetes mellitus ([place]) 02/26/2019  . Leg weakness, bilateral 02/25/2019  . Postprandial bloating 02/25/2019  . Sensory problems with limbs 02/25/2019  . Ketonuria 11/04/2018  . Diabetes ([place]) 04/23/2018  . Uncontrolled type 1 diabetes mellitus with hyperglycemia ([place]) 05/24/2017  . Morbid obesity ([place]) 05/24/2017  . Insulin resistance 05/24/2017  . Acanthosis 05/24/2017    Conditions to be addressed/monitored per PCP order:  [place] : General Social Work (Adult)  Updates made by Ethelda Chick since 07/16/2020 12:00 AM    Problem: HH Services   Onset Date: 06/08/2020  Note:   Lucas Cunningham is a 20 y.o. year old male who sees Skinner-Kiser, Tor Netters, NP for primary care. The  [facility] Managed Care team was consulted for assistance with Limited access to food ADL IADL limitations. Lucas Cunningham was given information about Care Management services, agreed to services, and verbal consent for services was obtained.  Interventions:  . Patient interviewed and appropriate assessments performed . Collaborated with clinical team regarding patient needs  . SDOH (Social Determinants of Health) assessments performed: Yes .     Marland Kitchen Collaborated with [facility] for [facility] referral . BSW contacted patient and spoke with grandmother. She stated that due to patients neuropathy it makes it difficult for him to do a lot of things on his own. Grandmother states he does need assistance with bathing, with the braces on his legs it makes it difficult for him. Patient keeps a jug in his room for toileting. Grandmother states she does the best she  can, but assistance is needed. BSW contacted [facility] and spoke with Jenny Reichmann, a referral for [facility] was submitted and [employer] will reach out to patient to complete an assessment.  Cory Roughen stated she has applied for disability for patient but has not received an answer yet, grandmother is the only income in the household and food is getting hard to keep in the home. Grandmother states she has completed an application for foodstamps for patient and is waiting for a response. BSW provided grandmother with food pantry resources.  Marland Kitchen Update 06/28/20: BSW contacted patient and spoke with patient and grandmother. They both stated that they have not heard anything from [facility] about getting an assessment completed. BSW will contact [employer] to check on referral. Patient stated that they have started receiving foodstamps and have not heard anything about disabliity. Marland Kitchen Update 07/16/20: BSW spoke with patients grandmother today. She stated they still have not heard anything from disabliity nor [employer]. BSW contact [employer] and did not get a rep. BSW will continue to contact for PCS referral. Patient's mother wanted information on being paid to take care of patient.   Plan:  . Over the next 30-60 days, patient will work with BSW to address needs related to ADL IADL limitations . Social Worker will follow up in 14 days.   Mickel Fuchs, BSW, [place]  High Risk Managed Medicaid Team           Follow up:  Patient agrees to Care Plan and Follow-up.  Plan: The Managed Medicaid care management team will reach out to the patient again over the next 14 days.  Date/time of next scheduled Social Work care management/care coordination outreach:  08/05/20  Mickel Fuchs, Arita Miss, [place] Medicaid Team  [phone]

## 2020-07-16 NOTE — Telephone Encounter (Signed)
  Who's calling (name and relationship to patient) : Diamantina Monks (EC)  Best contact number: [phone] Judie Petit)  Provider they see: Buena Irish, [facility]  Reason for call: Grandmother is stating she does not understand why [name] is being told he has to go to a different doctor. Please give call to Mrs. Walker to discuss further.      PRESCRIPTION REFILL ONLY  Name of prescription:  Pharmacy:

## 2020-07-16 NOTE — Telephone Encounter (Signed)
Does he need to be referred to adult Endo, from your last note it looks like he was referred to [facility].

## 2020-07-16 NOTE — Patient Instructions (Signed)
Visit Information  [name] was given information about Medicaid Managed Care team care coordination services as a part of their [facility] Medicaid benefit. Lucas Cunningham verbally consented to engagement with the [facility] Managed Care team.   For questions related to your [facility] health plan, please call: [phone] or go here:[email or web address]  If you would like to schedule transportation through your [facility] plan, please call the following number at least 2 days in advance of your appointment: [phone].  Call the [facility] Crisis Line at [phone], at any time, 24 hours a day, 7 days a week. If you are in danger or need immediate medical attention call 911.  Lucas Cunningham - following are the goals we discussed in your visit today:  Goals Addressed   None      Social Worker will follow up in 14 days.   Lucas Cunningham, Lucas Cunningham, Lucas Cunningham  Lucas Cunningham  High Risk Managed Medicaid Team  [phone]  Following is a copy of your plan of care:  Patient Care Plan: General Plan of Care (Adult)    Problem Identified: Interdisciplinary Collaborative/Care Coordination   Priority: High  Onset Date: 06/11/2020    Long-Range Goal: Patient-Specific Goal   Start Date: 06/11/2020  Expected End Date: 09/10/2020  This Visit's Progress: On track  Priority: High  Note:   Current Barriers:  . Care Management support, education, and care coordination needs related to DM Case Manager Clinical Goal(s):  . patient will work with Lucas Cunningham to address needs related to Level of care concerns and ADL IADL limitations in patient with DM Interventions:  . Collaborated with Lucas Cunningham to initiate plan of care to address needs related to Level of care concerns and ADL IADL limitations in patient with DM Self Care Activities:  . Patient will attend all scheduled provider appointments . Patient will work with Lucas Cunningham to address care coordination needs and will  continue to work with the clinical team to address health care and disease management related needs.   Patient Goals: - schedule and keep appointment for annual check-up  - work with Lucas Cunningham, Lucas Cunningham for assistance with arranging [facility] for healthcare management Follow up plan:  The Managed Medicaid care management team will reach out to the patient again over the next 14 days.     Problem Identified: Health Promotion or Disease Self-Management (General Plan of Care)     Long-Range Goal: Self-Management Plan Developed   Start Date: 06/29/2020  Expected End Date: 09/29/2020  This Visit's Progress: On track  Priority: High  Note:   Current Barriers:   Ineffective Self Health Maintenance  Unable to independently manage [name] and CIDP  Currently UNABLE TO independently self manage needs related to chronic health conditions.   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  Provided education to patient re: diabetic diet and exercises while sitting  Reviewed medications with patient and discussed previously ordered gabapentin  Collaborated with PCP regarding DME  Discussed plans with patient for ongoing care management follow up and provided patient with direct contact information for care management team  Reviewed scheduled/upcoming provider appointments including: calling to  schedule follow up with PCP and scheduling appointment with [facility]) [address] [place], [place], [place] [address] Phone: [phone]   Social Work referral for assisting patient with feelings  associated with the loss of his mother Self Care Activities:  . Patient will self administer medications as prescribed . Patient will attend all scheduled provider appointments . Patient will call pharmacy for medication refills . Patient will call provider office for new concerns or questions Patient Goals: - adhere to a diabetic diet, eat three small meals a day with protein rich snacks - change to whole grain breads, cereal, pasta - drink 6 to 8 glasses of water each day - limit fast food meals to no more than 1 per week - set a realistic goal - switch to low-fat or skim milk  - arrange a ride through an agency 1 week before appointment, call [phone] for medical transportation provided by [facility] -contact [employer] for free phone benefits and over-the-counter medication benefits [phone] - call to cancel if needed - keep a calendar with prescription refill dates - keep a calendar with appointment dates  Follow Up Plan: Telephone follow up appointment with care management team member scheduled for:07/29/20 @ 1pm    Patient Care Plan: General Social Work (Adult)    Problem Identified: HH Services   Onset Date: 06/08/2020  Note:   Lucas Cunningham is a 20 y.o. year old male who sees Lucas Cunningham, Lucas Corpus, Lucas Cunningham for primary care. The  [facility] Managed Care team was consulted for assistance with Limited access to food ADL IADL limitations. Mr. Kossman was given information about Care Management services, agreed to services, and verbal consent for services was obtained.  Interventions:  . Patient interviewed and appropriate assessments performed . Collaborated with clinical team regarding patient needs  . SDOH (Social Determinants of Health) assessments performed: Yes .     Marland Kitchen Collaborated with [facility] for [facility] referral . Lucas Cunningham contacted patient and spoke with grandmother. She stated that due to patients neuropathy it makes it difficult for him to do a lot of things on his  own. Grandmother states he does need assistance with bathing, with the braces on his legs it makes it difficult for him. Patient keeps a jug in his room for toileting. Grandmother states she does the best she can, but assistance is needed. Lucas Cunningham contacted [facility] and spoke with Lucas Cunningham, a referral for [facility] was submitted and [employer] will reach out to patient to complete an assessment.  Lucas Cunningham stated she has applied for disability for patient but has not received an answer yet, grandmother is the only income in the household and food is getting hard to keep in the home. Grandmother states she has completed an application for foodstamps for patient and is waiting for a response. Lucas Cunningham provided grandmother with food pantry resources.  Marland Kitchen Update 06/28/20: Lucas Cunningham contacted patient and spoke with patient and grandmother. They both stated that they have not heard anything from [facility] about getting an assessment completed. Lucas Cunningham will contact [employer] to check on referral. Patient stated that they have started receiving foodstamps and have not heard anything about disabliity. Marland Kitchen Update 07/16/20: Lucas Cunningham spoke with patients grandmother today. She stated they still have not heard anything from disabliity nor [employer]. Lucas Cunningham contact [employer] and did not get a rep. Lucas Cunningham will continue to contact for PCS referral. Patient's mother wanted information on being paid to take care of patient.   Plan:  . Over the next 30-60 days,  patient will work with Lucas Cunningham to address needs related to ADL IADL limitations . Social Worker will follow up in 14 days.   Lucas Cunningham, Lucas Cunningham, Lucas Cunningham  [place]  High Risk Managed Medicaid Team

## 2020-07-19 ENCOUNTER — Other Ambulatory Visit: Payer: Self-pay

## 2020-07-19 NOTE — Telephone Encounter (Signed)
Spoke to grandmother, she advises a number from [facility] showed up on her phone and she thought is was from [name], from looking at the appt tab  it looks like he has an appt today at 38 with a [facility] pharmacist. I relayed that info to grandmother, she voiced understanding.

## 2020-07-21 ENCOUNTER — Ambulatory Visit (INDEPENDENT_AMBULATORY_CARE_PROVIDER_SITE_OTHER): Payer: Medicaid Other | Admitting: Podiatry

## 2020-07-21 ENCOUNTER — Other Ambulatory Visit: Payer: Self-pay

## 2020-07-21 DIAGNOSIS — E109 Type 1 diabetes mellitus without complications: Secondary | ICD-10-CM

## 2020-07-26 ENCOUNTER — Ambulatory Visit (INDEPENDENT_AMBULATORY_CARE_PROVIDER_SITE_OTHER): Payer: Medicaid Other | Admitting: Internal Medicine

## 2020-07-26 ENCOUNTER — Encounter: Payer: Self-pay | Admitting: Internal Medicine

## 2020-07-26 ENCOUNTER — Other Ambulatory Visit: Payer: Self-pay

## 2020-07-26 DIAGNOSIS — R29898 Other symptoms and signs involving the musculoskeletal system: Secondary | ICD-10-CM

## 2020-07-26 DIAGNOSIS — E1065 Type 1 diabetes mellitus with hyperglycemia: Secondary | ICD-10-CM | POA: Diagnosis not present

## 2020-07-26 LAB — GLUCOSE, CAPILLARY: Glucose-Capillary: 313 mg/dL — ABNORMAL HIGH (ref 70–99)

## 2020-07-26 MED ORDER — TRESIBA FLEXTOUCH 200 UNIT/ML ~~LOC~~ SOPN: 20.0000 [IU] | PEN_INJECTOR | Freq: Every day | SUBCUTANEOUS | 6 refills | Status: DC

## 2020-07-26 MED ORDER — HUMALOG KWIKPEN 200 UNIT/ML ~~LOC~~ SOPN: PEN_INJECTOR | SUBCUTANEOUS | 11 refills | Status: DC

## 2020-07-26 MED ORDER — TRULICITY 0.75 MG/0.5ML ~~LOC~~ SOAJ: 0.7500 mg | SUBCUTANEOUS | 6 refills | Status: DC

## 2020-07-26 NOTE — Assessment & Plan Note (Signed)
Patient presents today in need of new walker. He uses a walker at home, until recently when the back leg of his walk broke. He has been using his grandmother's walker, which she additionally needs. He is able to ambulate with a walker, but unable to use a cane secondary to weakness from his CIDP.  - Evaluate for new walker, DME order placed.

## 2020-07-26 NOTE — Progress Notes (Signed)
   CC: Replacement Walker, Medication Refills  HPI:  [name] is a 20 y.o. M, with a PMH noted below, who presents to the clinic Replacement Walker Medication Refills. To see the management of their acute and chronic conditions, please see the A&P note under the Encounters tab.   Past Medical History:  Diagnosis Date   CIDP (chronic inflammatory demyelinating polyneuropathy) (HCC)    Diabetes mellitus without complication (HCC)    type 1   Review of Systems:   Review of Systems  Constitutional:  Negative for chills, malaise/fatigue and weight loss.  Gastrointestinal:  Negative for abdominal pain, constipation, diarrhea, nausea and vomiting.    Physical Exam:  Vitals:   07/26/20 0936  BP: 134/80  Pulse: (!) 105  Temp: 98.8 F (37.1 C)  TempSrc: Oral  SpO2: 99%  Weight: 284 lb 3.2 oz (128.9 kg)  Height: 6' (1.829 m)   Physical Exam Constitutional:      General: He is not in acute distress.    Appearance: He is obese. He is not ill-appearing, toxic-appearing or diaphoretic.  Cardiovascular:     Rate and Rhythm: Normal rate and regular rhythm.     Pulses: Normal pulses.     Heart sounds: Normal heart sounds. No murmur heard.   No friction rub. No gallop.  Pulmonary:     Effort: Pulmonary effort is normal.     Breath sounds: Normal breath sounds. No wheezing, rhonchi or rales.  Abdominal:     General: Abdomen is flat. Bowel sounds are normal.     Tenderness: There is no abdominal tenderness.  Psychiatric:        Mood and Affect: Mood normal.        Behavior: Behavior normal.     Assessment & Plan:   See Encounters Tab for problem based charting.  Patient discussed with Dr. Mayford Knife

## 2020-07-26 NOTE — Patient Instructions (Addendum)
To Mr. Lazarz,   It was a pleasure meeting you today. Today we discussed continuing your diabetes medications. Please continue using your Tresiba, Humalog, Trulicity as indicated. I have put in refills to your pharmacy.   For your walker, I will place an order for a replacement today as well.   We will see you in one month to check on your blood sugars, please bring your blood sugar log with you as well.  Have a good day!  [name], MD

## 2020-07-27 ENCOUNTER — Encounter: Payer: Self-pay | Admitting: Podiatry

## 2020-07-27 NOTE — Progress Notes (Signed)
Subjective: Lucas Cunningham presents today referred by Amponsah, Prosper M, MD for diabetic foot evaluation.  Patient relates greater than 1 year history of diabetes.  Patient denies any history of foot wounds.  Patient denies any history of numbness, tingling, burning, pins/needles sensations.  Past Medical History:  Diagnosis Date   CIDP (chronic inflammatory demyelinating polyneuropathy) (HCC)    Diabetes mellitus without complication (HCC)    type 1    Patient Active Problem List   Diagnosis Date Noted   CIDP (chronic inflammatory demyelinating polyneuropathy) (HCC) 05/25/2020   Noncompliance with medication treatment due to difficulty with dosing 05/25/2020   Mixed hyperlipidemia 05/25/2020   Depression 05/25/2020   DKA, type 1 (HCC) 10/23/2019   Diabetic polyneuropathy associated with type 1 diabetes mellitus (HCC) 02/26/2019   Leg weakness, bilateral 02/25/2019   Postprandial bloating 02/25/2019   Sensory problems with limbs 02/25/2019   Ketonuria 11/04/2018   Diabetes (HCC) 04/23/2018   Uncontrolled type 1 diabetes mellitus with hyperglycemia (HCC) 05/24/2017   Morbid obesity (HCC) 05/24/2017   Insulin resistance 05/24/2017   Acanthosis 05/24/2017    Past Surgical History:  Procedure Laterality Date   NO PAST SURGERIES      Current Outpatient Medications on File Prior to Visit  Medication Sig Dispense Refill   Accu-Chek FastClix Lancets MISC 1 Device by Does not apply route as directed. Use with Fastclix lancet device to check blood sugar up to 8x daily. (diagnosis code E10.65) 300 each 11   acetone, urine, test strip 1 strip by Does not apply route as needed for high blood sugar. Up to 8x daily if necessary 360 strip 11   atorvastatin (LIPITOR) 10 MG tablet Take 1 tablet by mouth daily.     Continuous Blood Gluc Receiver (DEXCOM G6 RECEIVER) DEVI 1 Device by Does not apply route as directed. 1 each 2   Continuous Blood Gluc Receiver (DEXCOM G6 RECEIVER) DEVI USE 1  DEVICE AS DIRECTED. 1 each 2   Continuous Blood Gluc Sensor (DEXCOM G6 SENSOR) MISC Inject 1 applicator into the skin as directed. (change sensor every 10 days) 3 each 11   Continuous Blood Gluc Sensor (DEXCOM G6 SENSOR) MISC INJECT 1 APPLICATOR INTO THE SKIN AS DIRECTED. (CHANGE SENSOR EVERY 10 DAYS) 3 each 11   Continuous Blood Gluc Transmit (DEXCOM G6 TRANSMITTER) MISC Inject 1 Device into the skin as directed. (re-use up to 8x with each new sensor) 1 each 3   Continuous Blood Gluc Transmit (DEXCOM G6 TRANSMITTER) MISC INJECT 1 DEVICE INTO THE SKIN AS DIRECTED. (RE-USE UP TO 8X WITH EACH NEW SENSOR) 1 each 3   gabapentin (NEURONTIN) 300 MG capsule Take 1 capsule (300 mg total) by mouth 2 (two) times daily. 60 capsule 5   Glucagon (BAQSIMI TWO PACK) 3 MG/DOSE POWD Place 1 spray into the nose as directed. 2 each 3   Glucose Blood (BLOOD GLUCOSE TEST STRIPS) STRP 1 Device by Other route as directed. To check blood sugar up to 8x per day 300 strip 11   Insulin Pen Needle (BD PEN NEEDLE NANO U/F) 32G X 4 MM MISC Inject up to 8 times per day. 250 each 6   Insulin Regular Human (AFREZZA) 4 & 8 & 12 units POWD Inhale up to 96 units daily (Patient not taking: No sig reported) 360 each 11   Insulin Regular Human 4 & 8 & 12 units POWD INHALE UP TO 96 UNITS DAILY (Patient not taking: No sig reported) 360 each 11   ketoconazole (  NIZORAL) 2 % cream Apply to fet daily. 30 g 6   Lancets Misc. (ACCU-CHEK FASTCLIX LANCET) KIT 1 Device by Does not apply route as directed. Use with lancets to check blood sugar up to 8x daily.  (diagnosis code E10.65) (Patient taking differently: 1 Device by Does not apply route as directed. Use with lancets to check blood sugar up to 8x daily.  (diagnosis code E10.65)) 1 kit 3   ondansetron (ZOFRAN ODT) 8 MG disintegrating tablet Take 1 tablet (8 mg total) by mouth every 8 (eight) hours as needed for nausea or vomiting. (Patient not taking: No sig reported) 20 tablet 6   ondansetron  (ZOFRAN-ODT) 8 MG disintegrating tablet TAKE 1 TABLET (8 MG TOTAL) BY MOUTH EVERY 8 (EIGHT) HOURS AS NEEDED FOR NAUSEA OR VOMITING. (Patient not taking: No sig reported) 20 tablet 6   PRESCRIPTION MEDICATION Inject into the vein. IVIG every 4 weeks. Order for new start sent to [facility] on 04/08/2020.     No current facility-administered medications on file prior to visit.     No Known Allergies  Social History   Occupational History   Not on file  Tobacco Use   Smoking status: Former    Pack years: 0.00    Types: Cigarettes    Passive exposure: Yes   Smokeless tobacco: Never  Substance and Sexual Activity   Alcohol use: Not Currently   Drug use: Not Currently   Sexual activity: Not Currently    Family History  Problem Relation Age of Onset   Heart disease Mother    Hypertension Mother    Diabetes Maternal Grandmother    Diabetes Maternal Grandfather    Diabetes Paternal Grandmother    Migraines Neg Hx    Seizures Neg Hx    Autism Neg Hx    ADD / ADHD Neg Hx    Anxiety disorder Neg Hx    Depression Neg Hx    Bipolar disorder Neg Hx    Schizophrenia Neg Hx     Immunization History  Administered Date(s) Administered   Influenza,inj,Quad PF,6+ Mos 11/04/2018   Tdap 10/02/2017    Review of systems: Positive Findings in bold print.  Constitutional:  chills, fatigue, fever, sweats, weight change Communication: translator, sign language translator, hand writing, iPad/Android device Head: headaches, head injury Eyes: changes in vision, eye pain, glaucoma, cataracts, macular degeneration, diplopia, glare,  light sensitivity, eyeglasses or contacts, blindness Ears nose mouth throat: hearing impaired, hearing aids,  ringing in ears, deaf, sign language,  vertigo, nosebleeds,  rhinitis,  cold sores, snoring, swollen glands Cardiovascular: HTN, edema, arrhythmia, pacemaker in place, defibrillator in place, chest pain/tightness, chronic anticoagulation, blood  clot, heart failure, MI Peripheral Vascular: leg cramps, varicose veins, blood clots, lymphedema, varicosities Respiratory:  asthma, difficulty breathing, denies congestion, SOB, wheezing, cough, emphysema Gastrointestinal: change in appetite or weight, abdominal pain, constipation, diarrhea, nausea, vomiting, vomiting blood, change in bowel habits, abdominal pain, jaundice, rectal bleeding, hemorrhoids, GERD Genitourinary:  nocturia,  pain on urination, polyuria,  blood in urine, Foley catheter, urinary urgency, ESRD on hemodialysis Musculoskeletal: amputation, cramping, stiff joints, painful joints, decreased joint motion, fractures, OA, gout, hemiplegia, paraplegia, uses cane, wheelchair bound, uses walker, uses rollator Skin: +changes in toenails, color change, dryness, itching, mole changes,  rash, wound(s) Neurological: headaches, numbness in feet, paresthesias in feet, burning in feet, fainting,  seizures, change in speech, migraines, memory problems/poor historian, cerebral palsy, weakness, paralysis, CVA, TIA Endocrine: diabetes, hypothyroidism, hyperthyroidism,  goiter, dry mouth, flushing, heat   intolerance, cold intolerance,  excessive thirst, denies polyuria,  nocturia Hematological:  easy bleeding, excessive bleeding, easy bruising, enlarged lymph nodes, on long term blood thinner, history of past transusions Allergy/immunological:  hives, eczema, frequent infections, multiple drug allergies, seasonal allergies, transplant recipient, multiple food allergies Psychiatric:  anxiety, depression, mood disorder, suicidal ideations, hallucinations, insomnia  Objective: There were no vitals filed for this visit. Vascular Examination: Capillary refill time less than 3 seconds x 10 digits.  Dorsalis pedis pulses palpable 2 out of 4.  Posterior tibial pulses palpable 2 out of 4.  Digital hair not present x 10 digits.  Skin temperature gradient WNL b/l.  Dermatological Examination: Skin  with normal turgor, texture and tone b/l  Toenails 1-5 b/l discolored, thick, dystrophic with subungual debris and pain with palpation to nailbeds due to thickness of nails.  Musculoskeletal: Muscle strength 5/5 to all LE muscle groups.  Neurological: Sensation diminished with 10 gram monofilament to distal tip of the digits bilaterally  Vibratory sensation not intact.  Assessment: NIDDM Encounter for diabetic foot examination  Plan: Discussed diabetic foot care principles. Literature dispensed on today. Patient to continue soft, supportive shoe gear daily. Patient to report any pedal injuries to medical professional immediately. Follow up one year. Patient/POA to call should there be a concern in the interim.

## 2020-07-28 ENCOUNTER — Encounter: Payer: Self-pay | Admitting: Internal Medicine

## 2020-07-28 NOTE — Assessment & Plan Note (Signed)
Patient presents to the clinic in need of refills of his diabetes medication. He states that he is currently on Trulicity 0.75 weekly, Tresiba 20 Units nightly, and humalog 25 Units three times daily. - Refill medications - A1c in one month

## 2020-07-28 NOTE — Progress Notes (Signed)
Internal Medicine Clinic Attending ? ?Case discussed with Dr. Winters  At the time of the visit.  We reviewed the resident?s history and exam and pertinent patient test results.  I agree with the assessment, diagnosis, and plan of care documented in the resident?s note.  ?

## 2020-07-29 ENCOUNTER — Other Ambulatory Visit: Payer: Self-pay

## 2020-07-29 ENCOUNTER — Other Ambulatory Visit: Payer: Self-pay | Admitting: *Deleted

## 2020-07-29 NOTE — Patient Outreach (Signed)
Medicaid Managed Care   Nurse Care Manager Note  07/29/2020 Name:  Lucas Cunningham MRN:  [id number] DOB:  May 26, 2000  Lucas Cunningham is an 20 y.o. year old male who is a primary patient of Amponsah, Charisse March, MD.  The Medicaid Managed Care Coordination team was consulted for assistance with:    [name]  Lucas Cunningham was given information about Medicaid Managed Care Coordination team services today. Lucas Cunningham agreed to services and verbal consent obtained.  Engaged with patient by telephone for follow up visit in response to provider referral for case management and/or care coordination services.   Assessments/Interventions:  Review of past medical history, allergies, medications, health status, including review of consultants reports, laboratory and other test data, was performed as part of comprehensive evaluation and provision of chronic care management services.  SDOH (Social Determinants of Health) assessments and interventions performed:   Care Plan  No Known Allergies  Medications Reviewed Today     Reviewed by Lucas Mercury, MD (Resident) on 07/28/20 at 0726  Med List Status: <None>   Medication Order Taking? Sig Documenting Provider Last Dose Status Informant  Accu-Chek FastClix Lancets MISC [id number] No 1 Device by Does not apply route as directed. Use with Fastclix lancet device to check blood sugar up to 8x daily. (diagnosis code E10.65) Lucas Hock, MD Taking Active   acetone, urine, test strip [id number] No 1 strip by Does not apply route as needed for high blood sugar. Up to 8x daily if necessary Lucas Hock, MD Taking Active   atorvastatin (LIPITOR) 10 MG tablet [id number] No Take 1 tablet by mouth daily. [provider] Taking Active   Continuous Blood Gluc Receiver (DEXCOM G6 RECEIVER) DEVI [id number] No 1 Device by Does not apply route as directed. Lucas Hock, MD Taking Active   Continuous Blood Gluc Receiver [facility] G6 RECEIVER) DEVI  [id number] No USE 1 DEVICE AS DIRECTED. Lucas Hock, MD Taking Active   Continuous Blood Gluc Sensor (DEXCOM G6 SENSOR) MISC [id number] No Inject 1 applicator into the skin as directed. (change sensor every 10 days) Lucas Hock, MD Taking Active   Continuous Blood Gluc Sensor (DEXCOM G6 SENSOR) MISC [id number] No INJECT 1 APPLICATOR INTO THE SKIN AS DIRECTED. (CHANGE SENSOR EVERY 10 DAYS) Lucas Hock, MD Taking Active   Continuous Blood Gluc Transmit (DEXCOM G6 TRANSMITTER) MISC [id number] No Inject 1 Device into the skin as directed. (re-use up to 8x with each new sensor) Lucas Hock, MD Taking Active   Continuous Blood Gluc Transmit (DEXCOM G6 TRANSMITTER) MISC [id number] No INJECT 1 DEVICE INTO THE SKIN AS DIRECTED. (RE-USE UP TO 8X WITH EACH NEW SENSOR) Lucas Hock, MD Taking Active   Dulaglutide (TRULICITY) 5.57 DU/2.0UR Lucas Cunningham [id number]  Inject 0.75 mg into the skin once a week. Lucas Mercury, MD  Active   gabapentin (NEURONTIN) 300 MG capsule [id number]  Take 1 capsule (300 mg total) by mouth 2 (two) times daily. Lucas Anis, MD  Active   Glucagon [facility] TWO PACK) 3 MG/DOSE POWD [id number] No Place 1 spray into the nose as directed. Lucas Hock, MD Taking Active   Glucose Blood (BLOOD GLUCOSE TEST STRIPS) STRP [id number] No 1 Device by Other route as directed. To check blood sugar up to 8x per day Lucas Hock, MD Taking Active   insulin degludec (TRESIBA FLEXTOUCH) 200 UNIT/ML FlexTouch Pen [id number]  Inject 20 Units into the skin at bedtime. Lucas Mercury, MD  Active   insulin  lispro (HUMALOG KWIKPEN) 200 UNIT/ML KwikPen [id number]  Inject up to 100 units daily per provider instructions Lucas Mercury, MD  Active   Insulin Pen Needle (BD PEN NEEDLE NANO U/F) 32G X 4 MM MISC [id number] No Inject up to 8 times per day. Lucas Corpus, MD Taking Active   Insulin Regular Human [facility]) 4 & 8 & 12 units POWD [id number] No Inhale up to 96 units daily   Patient not taking: No sig reported   Lucas Hock, MD Not Taking Active   Insulin Regular Human 4 & 8 & 12 units POWD [id number] No INHALE UP TO 96 UNITS DAILY  Patient not taking: No sig reported   Lucas Hock, MD Not Taking Active   ketoconazole (NIZORAL) 2 % cream [id number] No Apply to fet daily. Lucas Hock, MD Taking Active   Lancets Misc. [facility] FASTCLIX LANCET) KIT [id number] No 1 Device by Does not apply route as directed. Use with lancets to check blood sugar up to 8x daily.  (diagnosis code E10.65)  Patient taking differently: 1 Device by Does not apply route as directed. Use with lancets to check blood sugar up to 8x daily.  (diagnosis code E10.65)   Lucas Hock, MD Taking Active   ondansetron (ZOFRAN ODT) 8 MG disintegrating tablet [id number] No Take 1 tablet (8 mg total) by mouth every 8 (eight) hours as needed for nausea or vomiting.  Patient not taking: No sig reported   Lucas Corpus, MD Not Taking Active   ondansetron (ZOFRAN-ODT) 8 MG disintegrating tablet [id number] No TAKE 1 TABLET (8 MG TOTAL) BY MOUTH EVERY 8 (EIGHT) HOURS AS NEEDED FOR NAUSEA OR VOMITING.  Patient not taking: No sig reported   Lucas Corpus, MD Not Taking Active   PRESCRIPTION MEDICATION [id number] No Inject into the vein. IVIG every 4 weeks. Order for new start sent to [place] on 04/08/2020. Lucas Beam, MD Taking Active             Patient Active Problem List   Diagnosis Date Noted   CIDP (chronic inflammatory demyelinating polyneuropathy) ([place]) 05/25/2020   Noncompliance with medication treatment due to difficulty with dosing 05/25/2020   Mixed hyperlipidemia 05/25/2020   Depression 05/25/2020   DKA, type 1 ([place]) 10/23/2019   Diabetic polyneuropathy associated with type 1 diabetes mellitus ([place]) 02/26/2019   Leg weakness, bilateral 02/25/2019   Postprandial bloating 02/25/2019   Sensory problems with limbs 02/25/2019   Ketonuria  11/04/2018   Diabetes ([place]) 04/23/2018   Uncontrolled type 1 diabetes mellitus with hyperglycemia ([place]) 05/24/2017   Morbid obesity ([place]) 05/24/2017   Insulin resistance 05/24/2017   Acanthosis 05/24/2017    Conditions to be addressed/monitored per PCP order:   [name]  Care Plan : General Plan of Care (Adult)  Updates made by Melissa Montane, RN since 07/29/2020 12:00 AM     Problem: Health Promotion or Disease Self-Management (General Plan of Care)      Long-Range Goal: Self-Management Plan Developed   Start Date: 06/29/2020  Expected End Date: 09/29/2020  This Visit's Progress: On track  Recent Progress: On track  Priority: High  Note:   Current Barriers:  Ineffective Self Health Maintenance-RNCM spoke with Lucas Cunningham, patient's grandmother, today. She was not at home and has the only phone. She reports not hearing anything regarding the DME ordered by PCP on 5/20. It looks like a second order was placed for walker on 6/13. She is unsure if Lucas Cunningham is using the CGM  and requested a call back in 2 hours. RNCM returned call to speak with Lucas Cunningham. 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 He is currently checking BS with glucometer, readings 250-270 preprandial and greater than 300 postprandial. He reports taking his insulin as prescribed. Reports that he has cut back on fast food and is eating more fruits and vegetables. 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  Knowledge Deficits related to short term plan for care coordination needs and long term plans for chronic disease management needs Nurse Case Manager Clinical Goal(s):  patient will work with care management team to address care coordination and chronic disease management needs related to Disease Management   Interventions:  Evaluation of current treatment plan related to [name] and CIDP and patient's adherence to plan as established by  provider. Provided education to patient re: diabetic diet and exercises while sitting-RNCM will resend Collaborated with PCP regarding DME-PCP placed order for walker and shower chair on 5/20, RNCM will reach out to PCP for update Discussed plans with patient for ongoing care management follow up and provided patient with direct contact information for care management team Reviewed scheduled/upcoming provider appointments including: calling to schedule follow up with PCP for July  Discussed the importance of eating 3 small meals a day, including protein with each meal. Discussed the importance of taking his medications consistently at prescribed times Discussed benefits provided by [facility] and encouraged patient to call [phone] and inquire Self Care Activities:  Patient will self administer medications as prescribed Patient will attend all scheduled provider appointments Patient will call pharmacy for medication refills Patient will call provider office for new concerns or questions Patient Goals: - adhere to a diabetic diet, eat three small meals a day with protein rich snacks - change to whole grain breads, cereal, pasta - drink 6 to 8 glasses of water each day - limit fast food meals to no more than 1 per week - set a realistic goal - switch to low-fat or skim milk  - arrange a ride through an agency 1 week before appointment, call [phone] for medical transportation provided by [facility] -contact [employer] for free phone benefits and over-the-counter medication benefits [phone] - call to cancel if needed - keep a calendar with prescription refill dates - keep a calendar with appointment dates  Follow Up Plan: Telephone follow up appointment with care management team member scheduled for:08/12/20 @ 1pm      Follow Up:  Patient agrees to Care Plan and Follow-up.  Plan: The Managed Medicaid care management team will reach out to the patient again over the next 14  days.  Date/time of next scheduled RN care management/care coordination outreach:  08/12/20 @ 1pm  Lucas Joiner RN, [place] RN Care Coordinator

## 2020-07-29 NOTE — Patient Instructions (Signed)
Visit Information  Lucas Cunningham was given information about Medicaid Managed Care team care coordination services as a part of their [facility] Medicaid benefit. Lucas Cunningham verbally consented to engagement with the [facility] Managed Care team.   For questions related to your [facility] health plan, please call: [phone] or go here:[email or web address]  If you would like to schedule transportation through your [facility] plan, please call the following number at least 2 days in advance of your appointment: [phone].  Call the [facility] Crisis Line at [phone], at any time, 24 hours a day, 7 days a week. If you are in danger or need immediate medical attention call 911.  Lucas Cunningham - following are the goals we discussed in your visit today:   Goals Addressed             This Visit's Progress    Make and Keep All Appointments       Timeframe:  Long-Range Goal Priority:  High Start Date:   06/29/20                        Expected End Date:  09/29/20                   Follow Up Date 08/12/20   - call the end of June to schedule f/u in July with PCP  - arrange a ride through an agency 1 week before appointment, call [phone] for medical transportation provided by [facility] -contact [facility] for free phone benefits and over-the-counter medication benefits [phone] - call to cancel if needed - keep a calendar with prescription refill dates - keep a calendar with appointment dates    Why is this important?   Part of staying healthy is seeing the doctor for follow-up care.  If you forget your appointments, there are some things you can do to stay on track.             Please see education materials related to diabetes provided as print materials.   Patient verbalizes understanding of instructions provided today.   Telephone follow up appointment with Managed Medicaid care management team member scheduled for:08/12/20 @  1pm  Lucas Emms RN, BSN Lucas Cunningham  [facility] RN Care Coordinator   Following is a copy of your plan of care:  Patient Care Plan: General Plan of Care (Adult)     Problem Identified: Interdisciplinary Collaborative/Care Coordination   Priority: High  Onset Date: 06/11/2020     Long-Range Goal: Patient-Specific Goal   Start Date: 06/11/2020  Expected End Date: 09/10/2020  This Visit's Progress: On track  Priority: High  Note:   Current Barriers:  Care Management support, education, and care coordination needs related to DM Case Manager Clinical Goal(s):  patient will work with BSW to address needs related to Level of care concerns and ADL IADL limitations in patient with DM Interventions:  Collaborated with BSW to initiate plan of care to address needs related to Level of care concerns and ADL IADL limitations in patient with DM Self Care Activities:  Patient will attend all scheduled provider appointments Patient will work with BSW to address care coordination needs and will continue to work with the clinical team to address health care and disease management related needs.   Patient Goals: - schedule and keep appointment for annual check-up  - work with Lucas Cunningham, BSW for assistance with arranging [facility] for healthcare management Follow up plan:  The Managed Medicaid care management  team will reach out to the patient again over the next 14 days.      Problem Identified: Health Promotion or Disease Self-Management (General Plan of Care)      Long-Range Goal: Self-Management Plan Developed   Start Date: 06/29/2020  Expected End Date: 09/29/2020  This Visit's Progress: On track  Recent Progress: On track  Priority: High  Note:   Current Barriers:  Ineffective Self Health Maintenance-RNCM spoke with Lucas Cunningham, patient's grandmother, today. She was not at home and has the only phone. She reports not hearing anything regarding the DME ordered by PCP on 5/20. It  looks like a second order was placed for walker on 6/13. She is unsure if Lucas Cunningham is using the CGM and requested a call back in 2 hours. RNCM returned call to speak with Lucas Cunningham. 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 He is currently checking BS with glucometer, readings 250-270 preprandial and greater than 300 postprandial. He reports taking his insulin as prescribed. Reports that he has cut back on fast food and is eating more fruits and vegetables. 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  Knowledge Deficits related to short term plan for care coordination needs and long term plans for chronic disease management needs Nurse Case Manager Clinical Goal(s):  patient will work with care management team to address care coordination and chronic disease management needs related to Disease Management   Interventions:  Evaluation of current treatment plan related to [name] and CIDP and patient's adherence to plan as established by provider. Provided education to patient re: diabetic diet and exercises while sitting-RNCM will resend Collaborated with PCP regarding DME-PCP placed order for walker and shower chair on 5/20, RNCM will reach out to PCP for update Discussed plans with patient for ongoing care management follow up and provided patient with direct contact information for care management team Reviewed scheduled/upcoming provider appointments including: calling to schedule follow up with PCP for July  Discussed the importance of eating 3 small meals a day, including protein with each meal. Discussed the importance of taking his medications consistently at prescribed times Discussed benefits provided by [facility] and encouraged patient to call [phone] and inquire Self Care Activities:  Patient will self administer medications as prescribed Patient will attend all scheduled provider appointments Patient  will call pharmacy for medication refills Patient will call provider office for new concerns or questions Patient Goals: - adhere to a diabetic diet, eat three small meals a day with protein rich snacks - change to whole grain breads, cereal, pasta - drink 6 to 8 glasses of water each day - limit fast food meals to no more than 1 per week - set a realistic goal - switch to low-fat or skim milk  - arrange a ride through an agency 1 week before appointment, call [phone] for medical transportation provided by [facility] -contact Lucas Cunningham for free phone benefits and over-the-counter medication benefits [phone] - call to cancel if needed - keep a calendar with prescription refill dates - keep a calendar with appointment dates  Follow Up Plan: Telephone follow up appointment with care management team member scheduled for:08/12/20 @ 1pm     Patient Care Plan: General Social Work (Adult)     Problem Identified: HH Services   Onset Date: 06/08/2020  Note:   Lucas Cunningham is a 20 y.o. year old male who sees Lucas Cunningham, Lucas Corpus, Lucas Cunningham for primary care. The  Medicaid Managed  Care team was consulted for assistance with Limited access to food ADL IADL limitations. Mr. Berka was given information about Care Management services, agreed to services, and verbal consent for services was obtained.  Interventions:  Patient interviewed and appropriate assessments performed Collaborated with clinical team regarding patient needs  SDOH (Social Determinants of Health) assessments performed: Yes     Collaborated with [facility] for [facility] referral BSW contacted patient and spoke with grandmother. She stated that due to patients neuropathy it makes it difficult for him to do a lot of things on his own. Grandmother states he does need assistance with bathing, with the braces on his legs it makes it difficult for him. Patient keeps a jug in his room for toileting. Grandmother states she does the best  she can, but assistance is needed. BSW contacted [facility] and spoke with Lucas Cunningham, a referral for [facility] was submitted and Lucas Cunningham will reach out to patient to complete an assessment.  Grandmother stated she has applied for disability for patient but has not received an answer yet, grandmother is the only income in the household and food is getting hard to keep in the home. Grandmother states she has completed an application for foodstamps for patient and is waiting for a response. BSW provided grandmother with food pantry resources.  Update 06/28/20: BSW contacted patient and spoke with patient and grandmother. They both stated that they have not heard anything from Lucas Cunningham about getting an assessment completed. BSW will contact Lucas Cunningham to check on referral. Patient stated that they have started receiving foodstamps and have not heard anything about disabliity. Update 07/16/20: BSW spoke with patients grandmother today. She stated they still have not heard anything from disabliity nor Lucas Cunningham. BSW contact Lucas Cunningham and did not get a rep. BSW will continue to contact for PCS referral. Patient's mother wanted information on being paid to take care of patient.   Plan:  Over the next 30-60 days, patient will work with BSW to address needs related to ADL IADL limitations Social Worker will follow up in 14 days.   Gus Puma, BSW, [place] [facility]  [place]  High Risk Managed Medicaid Team

## 2020-08-03 ENCOUNTER — Other Ambulatory Visit: Payer: Medicaid Other

## 2020-08-03 NOTE — Patient Outreach (Signed)
Grandmother called stating she needed something urgent on my VM. I called back but no answer, left VM for her to call me back. Will await call

## 2020-08-04 NOTE — Patient Outreach (Signed)
Patient would like to get onboarded into [facility]. Will f/u next week to start process. They called me asking for packs Plan: Verbal consent obtained for [facility] enhanced pharmacy services (medication synchronization, adherence packaging, delivery coordination). A medication sync plan was created to allow patient to get all medications delivered once every 30 to 90 days per patient preference. Patient understands they have freedom to choose pharmacy and clinical pharmacist will coordinate care between all prescribers and [facility].

## 2020-08-05 ENCOUNTER — Other Ambulatory Visit: Payer: Self-pay

## 2020-08-05 NOTE — Patient Outreach (Signed)
Care Coordination  08/05/2020  Lucas Cunningham 03-Mar-2000 [id number]   Medicaid Managed Care   Unsuccessful Outreach Note  08/05/2020 Name: Lucas Cunningham  Referred by: Steffanie Rainwater, MD Reason for referral : High Risk Managed Medicaid (MM Social Work Unsuccessful [employer])   An unsuccessful telephone outreach was attempted today. The patient was referred to the case management team for assistance with care management and care coordination.   Follow Up Plan: The care management team will reach out to the patient again over the next 14 days.   Gus Puma, BSW, [place] [facility]  [employer] Risk Managed Medicaid Team  [phone]

## 2020-08-05 NOTE — Patient Instructions (Signed)
Visit Information  Mr. Chinedu Agustin  - as a part of your Medicaid benefit, you are eligible for care management and care coordination services at no cost or copay. I was unable to reach you by phone today but would be happy to help you with your health related needs. Please feel free to call me @ [phone].   A member of the Managed Medicaid care management team will reach out to you again over the next 14 days.   Gus Puma, BSW, [place] [facility]  [employer] Risk Managed Medicaid Team  [phone]

## 2020-08-09 ENCOUNTER — Telehealth: Payer: Self-pay | Admitting: Dietician

## 2020-08-09 DIAGNOSIS — E1065 Type 1 diabetes mellitus with hyperglycemia: Secondary | ICD-10-CM

## 2020-08-09 NOTE — Telephone Encounter (Signed)
Appointment scheduled for 08/26/20 to meet patient, connect Dexcom remotely, a1c. Referral requested

## 2020-08-10 ENCOUNTER — Other Ambulatory Visit: Payer: Self-pay

## 2020-08-10 NOTE — Patient Outreach (Signed)
Patient's grandmother called me wanting to get meds delivered ASAP. Unable to conduct normal visit, their main priority was delivery. Plan: Verbal consent obtained for [facility] enhanced pharmacy services (medication synchronization, adherence packaging, delivery coordination). A medication sync plan was created to allow patient to get all medications delivered once every 30 to 90 days per patient preference. Patient understands they have freedom to choose pharmacy and clinical pharmacist will coordinate care between all prescribers and [facility].   Medication Name PCP Transfer (put Dr.'s name) -Timing Last Fill Date & Day Supply Format: MM/DD/YY - DS (If last fill/DS unavailable, list pt.'s quantity on hand) Anticipated next due date  Format: MM/DD/YY      BB  B  L  EM  BT    Humalog 30 units  Lucas Cunningham/Lucas Cunningham      07/27/20 for60 days 09/24/20  Lucas Cunningham 25 units HS  Lucas Cunningham      05/27/20 for 60 da6s 08/19/20  Trulicity 7.75  Lucas Cunningham      07/22/20 for 28 days 08/19/20  Atorvastatin 10mg  QD     1  04/29/20 for 90 days (Very non-compliant) 08/24/20  Gabapentin 300mg  BID  10/25/20  1  1  07/02/20 08/24/20  Dexcom  07/04/20      08/10/20 for 30 days 09/09/20

## 2020-08-10 NOTE — Patient Instructions (Signed)
Visit Information  Mr. Marzette was given information about Medicaid Managed Care team care coordination services as a part of their [facility] Medicaid benefit. Renaud Celli verbally consented to engagement with the [facility] Managed Care team.   For questions related to your [facility] health plan, please call: [phone] or go here:[email or web address]  If you would like to schedule transportation through your [facility] plan, please call the following number at least 2 days in advance of your appointment: [phone].  Call the [facility] Crisis Line at [phone], at any time, 24 hours a day, 7 days a week. If you are in danger or need immediate medical attention call 911.  Mr. Scheck - following are the goals we discussed in your visit today:   Goals Addressed   None     Please see education materials related to DM provided as print materials.   Patient verbalizes understanding of instructions provided today.   The patient has been provided with contact information for the Managed Medicaid care management team and has been advised to call with any health related questions or concerns.   Artelia Laroche, Pharm.D., Managed Medicaid Pharmacist [phone]   Following is a copy of your plan of care:  Patient Care Plan: General Plan of Care (Adult)     Problem Identified: Interdisciplinary Collaborative/Care Coordination   Priority: High  Onset Date: 06/11/2020     Long-Range Goal: Patient-Specific Goal   Start Date: 06/11/2020  Expected End Date: 09/10/2020  This Visit's Progress: On track  Priority: High  Note:   Current Barriers:  Care Management support, education, and care coordination needs related to DM Case Manager Clinical Goal(s):  patient will work with BSW to address needs related to Level of care concerns and ADL IADL limitations in patient with DM Interventions:  Collaborated with BSW to initiate plan of care to address needs  related to Level of care concerns and ADL IADL limitations in patient with DM Self Care Activities:  Patient will attend all scheduled provider appointments Patient will work with BSW to address care coordination needs and will continue to work with the clinical team to address health care and disease management related needs.   Patient Goals: - schedule and keep appointment for annual check-up  - work with [name], BSW for assistance with arranging [facility] for healthcare management Follow up plan:  The Managed Medicaid care management team will reach out to the patient again over the next 14 days.      Problem Identified: Health Promotion or Disease Self-Management (General Plan of Care)      Long-Range Goal: Self-Management Plan Developed   Start Date: 06/29/2020  Expected End Date: 09/29/2020  This Visit's Progress: On track  Recent Progress: On track  Priority: High  Note:   Current Barriers:  Ineffective Self Health Maintenance-RNCM spoke with [name], patient's grandmother, today. She was not at home and has the only phone. She reports not hearing anything regarding the DME ordered by PCP on 5/20. It looks like a second order was placed for walker on 6/13. She is unsure if [name] is using the CGM and requested a call back in 2 hours. RNCM returned call to speak with Benjimin. 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 He is currently checking BS with glucometer, readings 250-270 preprandial and greater than 300 postprandial. He reports taking his insulin as prescribed. Reports that he has cut back on fast food and is eating more fruits and vegetables. Unable to independently manage [name] and  CIDP Currently UNABLE TO independently self manage needs related to chronic health conditions.  Knowledge Deficits related to short term plan for care coordination needs and long term plans for chronic disease management needs Nurse Case Manager Clinical Goal(s):  patient will work with care management  team to address care coordination and chronic disease management needs related to Disease Management   Interventions:  Evaluation of current treatment plan related to [name] and CIDP and patient's adherence to plan as established by provider. Provided education to patient re: diabetic diet and exercises while sitting-RNCM will resend Collaborated with PCP regarding DME-PCP placed order for walker and shower chair on 5/20, RNCM will reach out to PCP for update Discussed plans with patient for ongoing care management follow up and provided patient with direct contact information for care management team Reviewed scheduled/upcoming provider appointments including: calling to schedule follow up with PCP for July  Discussed the importance of eating 3 small meals a day, including protein with each meal. Discussed the importance of taking his medications consistently at prescribed times Discussed benefits provided by [facility] and encouraged patient to call [phone] and inquire Self Care Activities:  Patient will self administer medications as prescribed Patient will attend all scheduled provider appointments Patient will call pharmacy for medication refills Patient will call provider office for new concerns or questions Patient Goals: - adhere to a diabetic diet, eat three small meals a day with protein rich snacks - change to whole grain breads, cereal, pasta - drink 6 to 8 glasses of water each day - limit fast food meals to no more than 1 per week - set a realistic goal - switch to low-fat or skim milk  - arrange a ride through an agency 1 week before appointment, call [phone] for medical transportation provided by [facility] -contact [employer] for free phone benefits and over-the-counter medication benefits [phone] - call to cancel if needed - keep a calendar with prescription refill dates - keep a calendar with appointment dates  Follow Up Plan: Telephone follow up appointment  with care management team member scheduled for:08/12/20 @ 1pm     Patient Care Plan: General Social Work (Adult)     Problem Identified: HH Services   Onset Date: 06/08/2020  Note:   Cian Costanzo is a 20 y.o. year old male who sees Skinner-Kiser, Jeannette Corpus, NP for primary care. The  [facility] Managed Care team was consulted for assistance with Limited access to food ADL IADL limitations. Mr. Hammerschmidt was given information about Care Management services, agreed to services, and verbal consent for services was obtained.  Interventions:  Patient interviewed and appropriate assessments performed Collaborated with clinical team regarding patient needs  SDOH (Social Determinants of Health) assessments performed: Yes     Collaborated with [facility] for [facility] referral BSW contacted patient and spoke with grandmother. She stated that due to patients neuropathy it makes it difficult for him to do a lot of things on his own. Grandmother states he does need assistance with bathing, with the braces on his legs it makes it difficult for him. Patient keeps a jug in his room for toileting. Grandmother states she does the best she can, but assistance is needed. BSW contacted [facility] and spoke with [name], a referral for [facility] was submitted and [employer] will reach out to patient to complete an assessment.  Grandmother stated she has applied for disability for patient but has not received an answer yet, grandmother is the only income in the household and food is getting  hard to keep in the home. Grandmother states she has completed an application for foodstamps for patient and is waiting for a response. BSW provided grandmother with food pantry resources.  Update 06/28/20: BSW contacted patient and spoke with patient and grandmother. They both stated that they have not heard anything from [facility] about getting an assessment completed. BSW will contact [employer] to check on referral. Patient stated that they have started  receiving foodstamps and have not heard anything about disabliity. Update 07/16/20: BSW spoke with patients grandmother today. She stated they still have not heard anything from disabliity nor [employer]. BSW contact [employer] and did not get a rep. BSW will continue to contact for PCS referral. Patient's mother wanted information on being paid to take care of patient.   Plan:  Over the next 30-60 days, patient will work with BSW to address needs related to ADL IADL limitations Social Worker will follow up in 14 days.   Gus Puma, BSW, [place] [facility]  [place]  High Risk Managed Medicaid Team

## 2020-08-11 ENCOUNTER — Telehealth: Payer: Self-pay

## 2020-08-11 NOTE — Telephone Encounter (Signed)
I called patient to follow up on patient receiving his walker and shower chair. The patient on last visit with the doctor said the walker had a broken wheel, but this walker belonged to the grandmother he has never gotten his own walker.I am sending a DME order for patient to [employer] for a Walker and shower chair both orders are already in [place], [place] C6/29/202211:27 AM

## 2020-08-12 ENCOUNTER — Other Ambulatory Visit: Payer: Self-pay | Admitting: *Deleted

## 2020-08-12 NOTE — Patient Outreach (Signed)
Care Coordination  08/12/2020  [name] 2000-11-17 [id number]   Medicaid Managed Care   Unsuccessful Outreach Note  08/12/2020 Name: Lucas Cunningham MRN: [id number] DOB: 2000-11-02  Referred by: Steffanie Rainwater, MD Reason for referral : High Risk Managed Medicaid (Unsuccessful RNCM follow up outreach)   An unsuccessful telephone outreach was attempted today. The patient was referred to the case management team for assistance with care management and care coordination.   Follow Up Plan: A HIPAA compliant phone message was left for the patient providing contact information and requesting a return call.  The care management team will reach out to the patient again over the next 14 days.   [name] RN, BSN [place]  [facility]

## 2020-08-12 NOTE — Patient Instructions (Signed)
Visit Information  Mr. Jaxan Michel  - as a part of your Medicaid benefit, you are eligible for care management and care coordination services at no cost or copay. I was unable to reach you by phone today but would be happy to help you with your health related needs. Please feel free to call me @ [phone].   A member of the Managed Medicaid care management team will reach out to you again over the next 14 days.   [name] RN, BSN [place]  [facility]

## 2020-08-13 ENCOUNTER — Other Ambulatory Visit (INDEPENDENT_AMBULATORY_CARE_PROVIDER_SITE_OTHER): Payer: Self-pay | Admitting: Pediatrics

## 2020-08-13 DIAGNOSIS — E1065 Type 1 diabetes mellitus with hyperglycemia: Secondary | ICD-10-CM

## 2020-08-13 MED ORDER — TRULICITY 0.75 MG/0.5ML ~~LOC~~ SOAJ: 0.7500 mg | SUBCUTANEOUS | 6 refills | Status: DC

## 2020-08-13 MED ORDER — HUMALOG KWIKPEN 200 UNIT/ML ~~LOC~~ SOPN: PEN_INJECTOR | SUBCUTANEOUS | 11 refills | Status: DC

## 2020-08-13 MED ORDER — DEXCOM G6 TRANSMITTER MISC: 1.0000 | 3 refills | Status: DC

## 2020-08-13 MED ORDER — BD PEN NEEDLE NANO U/F 32G X 4 MM MISC: 6 refills | Status: DC

## 2020-08-13 MED ORDER — DEXCOM G6 SENSOR MISC: 1.0000 | 11 refills | Status: DC

## 2020-08-13 MED ORDER — TRESIBA FLEXTOUCH 200 UNIT/ML ~~LOC~~ SOPN: 20.0000 [IU] | PEN_INJECTOR | Freq: Every day | SUBCUTANEOUS | 6 refills | Status: DC

## 2020-08-17 ENCOUNTER — Encounter (INDEPENDENT_AMBULATORY_CARE_PROVIDER_SITE_OTHER): Payer: Self-pay | Admitting: Psychology

## 2020-08-17 ENCOUNTER — Other Ambulatory Visit: Payer: Self-pay

## 2020-08-17 MED ORDER — ATORVASTATIN CALCIUM 10 MG PO TABS: 10.0000 mg | ORAL_TABLET | Freq: Every day | ORAL | 0 refills | Status: DC

## 2020-08-17 MED ORDER — GABAPENTIN 300 MG PO CAPS: 300.0000 mg | ORAL_CAPSULE | Freq: Two times a day (BID) | ORAL | 0 refills | Status: DC

## 2020-08-17 NOTE — Patient Outreach (Signed)
Spoke with May in IM clinic ([phone]) to get Atorvastatin and Gabapentin sent to [facility].

## 2020-08-17 NOTE — Telephone Encounter (Signed)
Lucas Cunningham with medicaid requesting refill on  atorvastatin (LIPITOR) 10 MG tablet and gabapentin (NEURONTIN) 300 MG capsule.   Pt is using  [facility] - [place], [place] - [address] Phone:  [phone]  Fax:  [phone]

## 2020-08-20 ENCOUNTER — Other Ambulatory Visit (INDEPENDENT_AMBULATORY_CARE_PROVIDER_SITE_OTHER): Payer: Self-pay | Admitting: Pediatrics

## 2020-08-20 ENCOUNTER — Other Ambulatory Visit: Payer: Self-pay

## 2020-08-20 DIAGNOSIS — E1065 Type 1 diabetes mellitus with hyperglycemia: Secondary | ICD-10-CM

## 2020-08-20 MED ORDER — INSULIN GLARGINE 100 UNITS/ML SOLOSTAR PEN: PEN_INJECTOR | SUBCUTANEOUS | 5 refills | Status: DC

## 2020-08-20 MED ORDER — INSULIN GLARGINE 100 UNITS/ML SOLOSTAR PEN
PEN_INJECTOR | SUBCUTANEOUS | 5 refills | Status: DC
Start: 2020-08-20 — End: 2020-08-20

## 2020-08-20 NOTE — Patient Outreach (Signed)
Medicaid Managed Care    Pharmacy Note  08/20/2020 Name: Lucas Cunningham MRN: [id number] DOB: [birth year]  Lucas Cunningham is a 20 y.o. year old male who is a primary care patient of [place], Charisse March, MD. The [facility] Managed Care Coordination team was consulted for assistance with disease management and care coordination needs.    Engaged with patient Engaged with patient by telephone for follow up visit in response to referral for case management and/or care coordination services.  [name] was given information about Managed Medicaid Care Coordination team services today. [name] agreed to services and verbal consent obtained.   Objective:  Lab Results  Component Value Date   CREATININE 0.48 (L) 06/24/2020   CREATININE 0.47 (L) 30-May-2020   CREATININE 0.46 (L) 02/18/2020    Lab Results  Component Value Date   HGBA1C 13.9 (A) 05/30/20    No results found for: CHOL, TRIG, HDL, CHOLHDL, VLDL, LDLCALC, LDLDIRECT  Other: (TSH, CBC, Vit D, etc.)  Clinical ASCVD: No  The ASCVD Risk score Mikey Bussing DC Jr., et al., 2013) failed to calculate for the following reasons:   The 2013 ASCVD risk score is only valid for ages 71 to 40    Other: (CHADS2VASc if Afib, PHQ9 if depression, MMRC or CAT for COPD, ACT, DEXA)  BP Readings from Last 3 Encounters:  07/26/20 134/80  06/24/20 126/88  06/15/20 124/76    Assessment/Interventions: Review of patient past medical history, allergies, medications, health status, including review of consultants reports, laboratory and other test data, was performed as part of comprehensive evaluation and provision of chronic care management services.   DM -Trulicity -Tresiba (Non-preferred) -Lispro -Metformin 100mg  BID Tresiba 80 units daily at bedtime (200 units/mL pen), HumalogU200 (times/dosing below), metformin XR 1000 mg daily after breakfast and 1000 mg daily afternoon Humalog breakfast: 30 units lunch: 30 units dinner: 30 units His  grandmother is giving at least one shot a day when his hands are tired.    His diabetes management has been complicated by the death of his mother in 2019/05/31 and then his uncle died via suicide in 30-Jan-2020.  He is depressed. He lives with his grandmother who also has diabetes.  She is fearful of giving him injections for fear of hurting him. - He misses multiple doses of insulin per day due to sleeping, or feeling pain in hands, so he decides to sleep.  He lacks the strength to push the plunger, and has to open boxes and pen with his mouth.  He has to use his mouth to put the pen needle on the insulin pen.   July 2022: Asked PCP to change insulin from [place] to preferred. Will have to work closely with patient. Once we get his refills coordinated on time, will try to find regimen that works. Oral insulin is possibility. Spent most of this visit talking with Grandma, explaining new insulin that is prescribed, and building trust/relationship  SDOH (Social Determinants of Health) assessments and interventions performed:    Care Plan  No Known Allergies  Medications Reviewed Today     Reviewed by Lane Hacker, [facility] (Pharmacist) on 08/10/20 at [place] List Status: <None>   Medication Order Taking? Sig Documenting Provider Last Dose Status Informant  Accu-Chek FastClix Lancets MISC [id number] Yes 1 Device by Does not apply route as directed. Use with Fastclix lancet device to check blood sugar up to 8x daily. (diagnosis code E10.65) Sherrlyn Hock, MD Taking Active   acetone, urine,  test strip [id number] No 1 strip by Does not apply route as needed for high blood sugar. Up to 8x daily if necessary  Patient not taking: Reported on 08/10/2020   Sherrlyn Hock, MD Not Taking Consider Medication Status and Discontinue   atorvastatin (LIPITOR) 10 MG tablet [id number] Yes Take 1 tablet by mouth daily. [provider] Taking Active   Continuous Blood Gluc Receiver (DEXCOM G6  RECEIVER) DEVI [id number] No 1 Device by Does not apply route as directed.  Patient not taking: Reported on 08/10/2020   Sherrlyn Hock, MD Not Taking Consider Medication Status and Discontinue   Continuous Blood Gluc Receiver [facility] G6 RECEIVER) DEVI [id number] No USE 1 DEVICE AS DIRECTED.  Patient not taking: Reported on 08/10/2020   Sherrlyn Hock, MD Not Taking Consider Medication Status and Discontinue   Continuous Blood Gluc Sensor (DEXCOM G6 SENSOR) MISC [id number] No Inject 1 applicator into the skin as directed. (change sensor every 10 days)  Patient not taking: Reported on 08/10/2020   Sherrlyn Hock, MD Not Taking Consider Medication Status and Discontinue   Continuous Blood Gluc Sensor (DEXCOM G6 SENSOR) MISC [id number] Yes INJECT 1 APPLICATOR INTO THE SKIN AS DIRECTED. (CHANGE SENSOR EVERY 10 DAYS) Sherrlyn Hock, MD Taking Active   Continuous Blood Gluc Transmit (DEXCOM G6 TRANSMITTER) MISC [id number] No Inject 1 Device into the skin as directed. (re-use up to 8x with each new sensor)  Patient not taking: Reported on 08/10/2020   Sherrlyn Hock, MD Not Taking Consider Medication Status and Discontinue   Continuous Blood Gluc Transmit (DEXCOM G6 TRANSMITTER) MISC [id number] Yes INJECT 1 DEVICE INTO THE SKIN AS DIRECTED. (RE-USE UP TO 8X WITH EACH NEW SENSOR) Sherrlyn Hock, MD Taking Active   Dulaglutide (TRULICITY) 9.62 EZ/6.6QH Bonney Aid [id number] Yes Inject 0.75 mg into the skin once a week. Maudie Mercury, MD Taking Active   gabapentin (NEURONTIN) 300 MG capsule [id number] Yes Take 1 capsule (300 mg total) by mouth 2 (two) times daily. Mosetta Anis, MD Taking Active   Glucagon [facility] TWO PACK) 3 MG/DOSE POWD [id number]  Place 1 spray into the nose as directed. Sherrlyn Hock, MD  Active   Glucose Blood (BLOOD GLUCOSE TEST STRIPS) STRP [id number] Yes 1 Device by Other route as directed. To check blood sugar up to 8x per day Sherrlyn Hock, MD Taking Active    insulin degludec (TRESIBA FLEXTOUCH) 200 UNIT/ML FlexTouch Pen [id number] Yes Inject 20 Units into the skin at bedtime. Maudie Mercury, MD Taking Active   insulin lispro (HUMALOG KWIKPEN) 200 UNIT/ML KwikPen [id number] Yes Inject up to 100 units daily per provider instructions Maudie Mercury, MD Taking Active   Insulin Pen Needle (BD PEN NEEDLE NANO U/F) 32G X 4 MM MISC [id number] Yes Inject up to 8 times per day. Al Corpus, MD Taking Active   Insulin Regular Human [facility]) 4 & 8 & 12 units POWD [id number] No Inhale up to 96 units daily  Patient not taking: No sig reported   Sherrlyn Hock, MD Not Taking Consider Medication Status and Discontinue   Insulin Regular Human 4 & 8 & 12 units POWD [id number] No INHALE UP TO 96 UNITS DAILY  Patient not taking: No sig reported   Sherrlyn Hock, MD Not Taking Consider Medication Status and Discontinue   ketoconazole (NIZORAL) 2 % cream [id number] No Apply to fet daily.  Patient not taking: Reported on 08/10/2020   Sherrlyn Hock, MD Not Taking Consider Medication Status  and Discontinue   Lancets Misc. (ACCU-CHEK FASTCLIX LANCET) KIT [id number]  1 Device by Does not apply route as directed. Use with lancets to check blood sugar up to 8x daily.  (diagnosis code E10.65)  Patient taking differently: 1 Device by Does not apply route as directed. Use with lancets to check blood sugar up to 8x daily.  (diagnosis code E10.65)   David Stall, MD  Active   ondansetron (ZOFRAN ODT) 8 MG disintegrating tablet [id number] No Take 1 tablet (8 mg total) by mouth every 8 (eight) hours as needed for nausea or vomiting.  Patient not taking: No sig reported   Silvana Newness, MD Not Taking Consider Medication Status and Discontinue   ondansetron (ZOFRAN-ODT) 8 MG disintegrating tablet [id number] No TAKE 1 TABLET (8 MG TOTAL) BY MOUTH EVERY 8 (EIGHT) HOURS AS NEEDED FOR NAUSEA OR VOMITING.  Patient not taking: No sig reported   Silvana Newness, MD Not  Taking Consider Medication Status and Discontinue   PRESCRIPTION MEDICATION [id number] Yes Inject into the vein. IVIG every 4 weeks. Order for new start sent to [facility] on 04/08/2020. 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   Noncompliance with medication treatment due to difficulty with dosing 05/25/2020   Mixed hyperlipidemia 05/25/2020   Depression 05/25/2020   DKA, type 1 (HCC) 10/23/2019   Diabetic polyneuropathy associated with type 1 diabetes mellitus (HCC) 02/26/2019   Leg weakness, bilateral 02/25/2019   Postprandial bloating 02/25/2019   Sensory problems with limbs 02/25/2019   Ketonuria 11/04/2018   Diabetes (HCC) 04/23/2018   Uncontrolled type 1 diabetes mellitus with hyperglycemia (HCC) 05/24/2017   Morbid obesity (HCC) 05/24/2017   Insulin resistance 05/24/2017   Acanthosis 05/24/2017    Conditions to be addressed/monitored: DM  Patient Care Plan: General Plan of Care (Adult)     Problem Identified: Interdisciplinary Collaborative/Care Coordination   Priority: High  Onset Date: 06/11/2020     Long-Range Goal: Patient-Specific Goal   Start Date: 06/11/2020  Expected End Date: 09/10/2020  This Visit's Progress: On track  Priority: High  Note:   Current Barriers:  Care Management support, education, and care coordination needs related to DM Case Manager Clinical Goal(s):  patient will work with BSW to address needs related to Level of care concerns and ADL IADL limitations in patient with DM Interventions:  Collaborated with BSW to initiate plan of care to address needs related to Level of care concerns and ADL IADL limitations in patient with DM Self Care Activities:  Patient will attend all scheduled provider appointments Patient will work with BSW to address care coordination needs and will continue to work  with the clinical team to address health care and disease management related needs.   Patient Goals: - schedule and keep appointment for annual check-up  - work with [name], BSW for assistance with arranging [facility] for healthcare management Follow up plan:  The Managed Medicaid care management team will reach out to the patient again over the next 14 days.      Problem Identified: Health Promotion or Disease Self-Management (General Plan of Care)      Long-Range Goal: Self-Management Plan Developed   Start Date: 06/29/2020  Expected End Date: 09/29/2020  This Visit's Progress: On track  Recent Progress: On track  Priority: High  Note:   Current Barriers:  Ineffective Self Health Maintenance-RNCM spoke with Esaw Dace, patient's grandmother, today. She was not at home and has the only phone. She reports not hearing anything regarding the DME ordered by PCP on 5/20. It looks like a second order was placed for walker on 6/13. She is unsure if [name] is using the CGM and requested a call back in 2 hours. RNCM returned call to speak with Coye. 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 He is currently checking BS with glucometer, readings 250-270 preprandial and greater than 300 postprandial. He reports taking his insulin as prescribed. Reports that he has cut back on fast food and is eating more fruits and vegetables. 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  Knowledge Deficits related to short term plan for care coordination needs and long term plans for chronic disease management needs Nurse Case Manager Clinical Goal(s):  patient will work with care management team to address care coordination and chronic disease management needs related to Disease Management   Interventions:  Evaluation of current treatment plan related to [name] and CIDP and patient's adherence to plan as established by  provider. Provided education to patient re: diabetic diet and exercises while sitting-RNCM will resend Collaborated with PCP regarding DME-PCP placed order for walker and shower chair on 5/20, RNCM will reach out to PCP for update Discussed plans with patient for ongoing care management follow up and provided patient with direct contact information for care management team Reviewed scheduled/upcoming provider appointments including: calling to schedule follow up with PCP for July  Discussed the importance of eating 3 small meals a day, including protein with each meal. Discussed the importance of taking his medications consistently at prescribed times Discussed benefits provided by [facility] and encouraged patient to call [phone] and inquire Self Care Activities:  Patient will self administer medications as prescribed Patient will attend all scheduled provider appointments Patient will call pharmacy for medication refills Patient will call provider office for new concerns or questions Patient Goals: - adhere to a diabetic diet, eat three small meals a day with protein rich snacks - change to whole grain breads, cereal, pasta - drink 6 to 8 glasses of water each day - limit fast food meals to no more than 1 per week - set a realistic goal - switch to low-fat or skim milk  - arrange a ride through an agency 1 week before appointment, call [phone] for medical transportation provided by [facility] -contact [employer] for free phone benefits and over-the-counter medication benefits [phone] - call to cancel if needed - keep a calendar with prescription refill dates - keep a calendar with appointment dates  Follow Up Plan: Telephone follow up appointment with care management team member scheduled for:08/12/20 @ 1pm     Patient Care Plan: General Social Work (Adult)     Problem Identified: HH Services   Onset Date: 06/08/2020  Note:   Johneric Mcfadden is a 20 y.o. year old male  who sees Skinner-Kiser, Tor Netters, NP for primary care. The  [facility] Managed Care team was consulted for assistance with Limited access to food ADL IADL limitations. Mr. Scurlock was given information about Care Management services, agreed to services, and verbal consent for services was obtained.  Interventions:  Patient interviewed and appropriate assessments performed Collaborated with clinical team regarding patient needs  SDOH (Social Determinants of Health) assessments performed: Yes     Collaborated with [facility] for [facility] referral BSW contacted  patient and spoke with grandmother. She stated that due to patients neuropathy it makes it difficult for him to do a lot of things on his own. Grandmother states he does need assistance with bathing, with the braces on his legs it makes it difficult for him. Patient keeps a jug in his room for toileting. Grandmother states she does the best she can, but assistance is needed. BSW contacted [facility] and spoke with Jenny Reichmann, a referral for [facility] was submitted and [facility] will reach out to patient to complete an assessment.  Grandmother stated she has applied for disability for patient but has not received an answer yet, grandmother is the only income in the household and food is getting hard to keep in the home. Grandmother states she has completed an application for foodstamps for patient and is waiting for a response. BSW provided grandmother with food pantry resources.  Update 06/28/20: BSW contacted patient and spoke with patient and grandmother. They both stated that they have not heard anything from [facility] about getting an assessment completed. BSW will contact [facility] to check on referral. Patient stated that they have started receiving foodstamps and have not heard anything about disabliity. Update 07/16/20: BSW spoke with patients grandmother today. She stated they still have not heard anything from disabliity nor [employer]. BSW contact [employer] and did  not get a rep. BSW will continue to contact for [facility] referral. Patient's mother wanted information on being paid to take care of patient.   Plan:  Over the next 30-60 days, patient will work with BSW to address needs related to ADL IADL limitations Social Worker will follow up in 14 days.   Mickel Fuchs, BSW, [place]  High Risk Managed Medicaid Team          Patient Care Plan: Medication Management     Problem Identified: Health Promotion or Disease Self-Management (General Plan of Care)      Goal: Medication Management   Note:   Current Barriers:  Unable to independently monitor therapeutic efficacy Unable to self administer medications as prescribed Does not adhere to prescribed medication regimen Does not maintain contact with provider office Does not contact provider office for questions/concerns   Pharmacist Clinical Goal(s):  Over the next 30 days, patient will achieve adherence to monitoring guidelines and medication adherence to achieve therapeutic efficacy contact provider office for questions/concerns as evidenced notation of same in electronic health record through collaboration with PharmD and provider.    Interventions: Inter-disciplinary care team collaboration (see longitudinal plan of care) Comprehensive medication review performed; medication list updated in electronic medical record      Patient Goals/Self-Care Activities Over the next 30 days, patient will:  - take medications as prescribed collaborate with provider on medication access solutions  Follow Up Plan: The care management team will reach out to the patient again over the next 30 days.      Task: Mutually Develop and Royce Macadamia Achievement of Patient Goals   Note:   Care Management Activities:    - verbalization of feelings encouraged    Notes:      Medication Assistance: None required. Patient affirms current coverage meets needs.   Follow  up: Agree/   Plan: The patient has been provided with contact information for the care management team and has been advised to call with any health related questions or concerns.   [place] Constable, Pharm.D., Managed Medicaid Pharmacist - [phone]

## 2020-08-25 ENCOUNTER — Telehealth: Payer: Self-pay | Admitting: Dietician

## 2020-08-25 NOTE — Telephone Encounter (Signed)
Notified Lucas Cunningham's grandmother that tomorrow's visit may be out of network with his insurance. She agrees to cancel it for now. I offered to assist with any questions or concerns they may have.

## 2020-08-26 ENCOUNTER — Ambulatory Visit: Payer: Medicaid Other | Admitting: Dietician

## 2020-08-31 ENCOUNTER — Other Ambulatory Visit: Payer: Self-pay | Admitting: *Deleted

## 2020-08-31 NOTE — Patient Instructions (Addendum)
Visit Information  [name]  - as a part of your Medicaid benefit, you are eligible for care management and care coordination services at no cost or copay. I was unable to reach you by phone today but would be happy to help you with your health related needs. Please feel free to call me @ [phone].   A member of the Managed Medicaid care management team will reach out to you again over the next 14 days on 09/23/20 @ 11:30am.   [name] RN, BSN [place]  [facility] RN Care Coordinator

## 2020-08-31 NOTE — Patient Outreach (Signed)
Care Coordination  08/31/2020  [name] 05-07-2000 [id number]   Medicaid Managed Care   Unsuccessful Outreach Note  08/31/2020 Name: Lucas Cunningham MRN: [id number] DOB: 2000/03/14  Referred by: Steffanie Rainwater, MD Reason for referral : High Risk Managed Medicaid (Unsuccessful RNCM follow up outreach)   A second unsuccessful telephone outreach was attempted today. The patient was referred to the case management team for assistance with care management and care coordination.   Follow Up Plan: The care management team will reach out to the patient again over the next 14 days.   [name] RN, BSN [place]  [facility]

## 2020-09-01 ENCOUNTER — Other Ambulatory Visit: Payer: Self-pay

## 2020-09-01 ENCOUNTER — Other Ambulatory Visit: Payer: Medicaid Other | Admitting: *Deleted

## 2020-09-01 NOTE — Patient Outreach (Signed)
Medicaid Managed Care   Nurse Care Manager Note  09/01/2020 Name:  Lucas Cunningham MRN:  [id number] DOB:  2000/04/02  Lucas Cunningham is an 20 y.o. year old male who is a primary patient of Amponsah, Charisse March, MD.  The Medicaid Managed Care Coordination team was consulted for assistance with:    [name]  Lucas Cunningham was given information about Medicaid Managed Care Coordination team services today. Lucas Cunningham agreed to services and verbal consent obtained.  Engaged with patient by telephone for follow up visit in response to provider referral for case management and/or care coordination services.   Assessments/Interventions:  Review of past medical history, allergies, medications, health status, including review of consultants reports, laboratory and other test data, was performed as part of comprehensive evaluation and provision of chronic care management services.  SDOH (Social Determinants of Health) assessments and interventions performed:   Care Plan  No Known Allergies  Medications Reviewed Today     Reviewed by Lucas Montane, RN (Registered Nurse) on 09/01/20 at 1138  Med List Status: <None>   Medication Order Taking? Sig Documenting Provider Last Dose Status Informant  Accu-Chek FastClix Lancets MISC [id number] No 1 Device by Does not apply route as directed. Use with Fastclix lancet device to check blood sugar up to 8x daily. (diagnosis code E10.65) Lucas Hock, MD Unknown Active   acetone, urine, test strip [id number] No 1 strip by Does not apply route as needed for high blood sugar. Up to 8x daily if necessary  Patient not taking: No sig reported   Lucas Hock, MD Not Taking Active   atorvastatin (LIPITOR) 10 MG tablet [id number] Yes Take 1 tablet (10 mg total) by mouth daily. Lucas Dame, MD Taking Active   Continuous Blood Gluc Receiver (DEXCOM G6 RECEIVER) DEVI [id number] No 1 Device by Does not apply route as directed.  Patient not taking: No sig reported    Lucas Hock, MD Unknown Active   Continuous Blood Gluc Receiver (DEXCOM G6 RECEIVER) DEVI [id number] No USE 1 DEVICE AS DIRECTED.  Patient not taking: No sig reported   Lucas Hock, MD Unknown Active   Continuous Blood Gluc Sensor (DEXCOM G6 SENSOR) MISC [id number] No INJECT 1 APPLICATOR INTO THE SKIN AS DIRECTED. (CHANGE SENSOR EVERY 10 DAYS) Lucas Hock, MD Unknown Active   Continuous Blood Gluc Sensor (DEXCOM G6 SENSOR) MISC [id number] No Inject 1 applicator into the skin as directed. (change sensor every 10 days) Lucas Corpus, MD Unknown Active   Continuous Blood Gluc Transmit (DEXCOM G6 TRANSMITTER) MISC [id number] No INJECT 1 DEVICE INTO THE SKIN AS DIRECTED. (RE-USE UP TO 8X WITH EACH NEW SENSOR) Lucas Hock, MD Unknown Active   Continuous Blood Gluc Transmit (DEXCOM G6 TRANSMITTER) MISC [id number] No Inject 1 Device into the skin as directed. (re-use up to 8x with each new sensor) Lucas Corpus, MD Unknown Active   Dulaglutide (TRULICITY) 6.71 IW/5.8KD SOPN [id number] Yes Inject 0.75 mg into the skin once a week. Lucas Corpus, MD Taking Active   gabapentin (NEURONTIN) 300 MG capsule [id number] Yes Take 1 capsule (300 mg total) by mouth 2 (two) times daily. Lucas Dame, MD Taking Active   Glucagon (BAQSIMI TWO PACK) 3 MG/DOSE POWD [id number] No Place 1 spray into the nose as directed.  Patient not taking: Reported on 09/01/2020   Lucas Hock, MD Not Taking Active   Glucose Blood (BLOOD GLUCOSE TEST STRIPS) STRP [id number] No 1 Device by Other route as directed. To check blood  sugar up to 8x per day Lucas Hock, MD Unknown Active   insulin glargine (LANTUS) 100 unit/mL SOPN [id number] No Inject up to 100 units subcutaneously daily as instructed.  Patient not taking: Reported on 09/01/2020   Lucas Corpus, MD Not Taking Active   insulin lispro (HUMALOG KWIKPEN) 200 UNIT/ML KwikPen [id number] Yes Inject up to 100 units daily per provider  instructions Lucas Corpus, MD Taking Active   Insulin Pen Needle (BD PEN NEEDLE NANO U/F) 32G X 4 MM MISC [id number] Yes Inject up to 8 times per day. Lucas Corpus, MD Taking Active   Insulin Regular Human [facility]) 4 & 8 & 12 units POWD [id number] No Inhale up to 96 units daily  Patient not taking: No sig reported   Lucas Hock, MD Not Taking Active   Insulin Regular Human 4 & 8 & 12 units POWD [id number] No INHALE UP TO 96 UNITS DAILY  Patient not taking: No sig reported   Lucas Hock, MD Not Taking Active   ketoconazole (NIZORAL) 2 % cream [id number] No Apply to fet daily.  Patient not taking: No sig reported   Lucas Hock, MD Not Taking Active   Lancets Misc. (ACCU-CHEK FASTCLIX LANCET) KIT [id number] No 1 Device by Does not apply route as directed. Use with lancets to check blood sugar up to 8x daily.  (diagnosis code E10.65)  Patient taking differently: 1 Device by Does not apply route as directed. Use with lancets to check blood sugar up to 8x daily.  (diagnosis code E10.65)   Lucas Hock, MD Unknown Active   ondansetron (ZOFRAN ODT) 8 MG disintegrating tablet [id number] No Take 1 tablet (8 mg total) by mouth every 8 (eight) hours as needed for nausea or vomiting.  Patient not taking: No sig reported   Lucas Corpus, MD Not Taking Active   ondansetron (ZOFRAN-ODT) 8 MG disintegrating tablet [id number] No TAKE 1 TABLET (8 MG TOTAL) BY MOUTH EVERY 8 (EIGHT) HOURS AS NEEDED FOR NAUSEA OR VOMITING.  Patient not taking: No sig reported   Lucas Corpus, MD Not Taking Active   PRESCRIPTION MEDICATION [id number] Yes Inject into the vein. IVIG every 4 weeks. Order for new start sent to [facility] on 04/08/2020. 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   Noncompliance with medication treatment due to difficulty with  dosing 05/25/2020   Mixed hyperlipidemia 05/25/2020   Depression 05/25/2020   DKA, type 1 ([place]) 10/23/2019   Diabetic polyneuropathy associated with type 1 diabetes mellitus ([place]) 02/26/2019   Leg weakness, bilateral 02/25/2019   Postprandial bloating 02/25/2019   Sensory problems with limbs 02/25/2019   Ketonuria 11/04/2018   Diabetes ([place]) 04/23/2018   Uncontrolled type 1 diabetes mellitus with hyperglycemia ([place]) 05/24/2017   Morbid obesity ([place]) 05/24/2017   Insulin resistance 05/24/2017   Acanthosis 05/24/2017    Conditions to be addressed/monitored per PCP order:   [name]  Care Plan : General Plan of Care (Adult)  Updates made by Lucas Montane, RN since 09/01/2020 12:00 AM     Problem: Health Promotion or Disease Self-Management (General Plan of Care)      Long-Range Goal: Self-Management Plan Developed   Start Date: 06/29/2020  Expected End Date: 09/29/2020  Recent Progress: On track  Priority: High  Note:   Current Barriers:  Ineffective Self  Health Maintenance-RNCM spoke with Esaw Dace, patient's grandmother, today. She was not at home and has the only phone. She reports not hearing anything regarding the DME ordered by PCP on 5/20. It looks like a second order was placed for walker on 6/13. She is unsure if [name] is using the CGM and requested a call back in 2 hours. RNCM returned call to speak with Byrne. 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 He is currently checking BS with glucometer, readings 250-270 preprandial and greater than 300 postprandial. He reports taking his insulin as prescribed. Reports that he has cut back on fast food and is eating more fruits and vegetables.-Update-Today Yong reports using Dexcom last, one week ago. He is not using it due to needing "a part that goes to it". RNCM discussed the different parts and explained that he has refills for sensors. Wyeth's Diabetic Education appointment was cancelled due to insurance coverage. Grandmother  misunderstood and thought that he needed to find a new PCP. Patient has received a new walker and shower chair. 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  Knowledge Deficits related to short term plan for care coordination needs and long term plans for chronic disease management needs Nurse Case Manager Clinical Goal(s):  patient will work with care management team to address care coordination and chronic disease management needs related to Disease Management   Interventions:  Evaluation of current treatment plan related to [name] and CIDP and patient's adherence to plan as established by provider. Collaborated with Butch Penny Plyler regarding cancelled Diabetic Education, she provided [facility] with NDES number [phone]) to call and schedule an appointment Called Internal Medicine [phone] and scheduled an appointment for follow up 09/15/20 @ 1:45pm Called Nutrition and Dietician [phone] and scheduled first available appointment on 09/22/20 @ 10:30am ([address], [address]) Advised patient and Grandmother to attend both appointments and to take all medications and pieces of dexcom to both upcoming appointments. Discussed plans with patient for ongoing care management follow up and provided patient with direct contact information for care management team RNCM will follow closely Discussed the importance of taking his medications consistently at prescribed times Discussed benefits provided by [facility] and encouraged patient to call [phone] and inquire Self Care Activities:  Patient will self administer medications as prescribed Patient will attend all scheduled provider appointments Patient will call pharmacy for medication refills Patient will call provider office for new concerns or questions Patient Goals: - adhere to a diabetic diet, eat three small meals a day with protein rich  snacks - change to whole grain breads, cereal, pasta - drink 6 to 8 glasses of water each day - limit fast food meals to no more than 1 per week - set a realistic goal - switch to low-fat or skim milk  - arrange a ride through an agency 1 week before appointment, call [phone] for medical transportation provided by [facility] -contact [place] for free phone benefits and over-the-counter medication benefits [phone] - call to cancel if needed - keep a calendar with prescription refill dates - keep a calendar with appointment dates  Follow Up Plan: Telephone follow up appointment with care management team member scheduled for:09/21/20 @ 12:30pm      Follow Up:  Patient agrees to Care Plan and Follow-up.  Plan: The Managed Medicaid care management team will reach out to the patient again over the next 21 days.  Date/time of next scheduled RN  care management/care coordination outreach:  09/21/20 @ 12:30pm  Lurena Joiner RN, BSN [place] RN Care Coordinator

## 2020-09-01 NOTE — Patient Instructions (Signed)
Visit Information  Mr. Gaynelle AduMcCain was given information about Medicaid Managed Care team care coordination services as a part of their [facility] benefit. Alphonzo Lemmingsazman Puglia verbally consented to engagement with the [facility].   For questions related to your [facility] Medicaid health plan, please call: [phone] or go here:[email or web address]  If you would like to schedule transportation through your [facility] Medicaid plan, please call the following number at least 2 days in advance of your appointment: [phone].  Call the [facility] Crisis Line at [phone], at any time, 24 hours a day, 7 days a week. If you are in danger or need immediate medical attention call 911.  If you would like help to quit smoking, call 1-800-QUIT-NOW (509 277 06741-321-037-6586) OR Espaol: 1-855-Djelo-Ya (4-132-440-1027(1-([phone]) o para ms informacin haga clic aqu or Text READY to 253-664200-400 to register via text  Mr. Gaynelle AduMcCain - following are the goals we discussed in your visit today:   Goals Addressed             This Visit's Progress    Eat Healthy       Timeframe:  Long-Range Goal Priority:  High Start Date:  06/29/20                             Expected End Date:  09/29/20                  Follow Up Date 09/21/20   - adhere to a diabetic diet, eat three small meals a day with protein rich snacks - change to whole grain breads, cereal, pasta - drink 6 to 8 glasses of water each day - limit fast food meals to no more than 1 per week - set a realistic goal - switch to low-fat or skim milk    Why is this important?   When you are ready to manage your nutrition or weight, having a plan and setting goals will help.  Taking small steps to change how you eat and exercise is a good place to start.         Make and Keep All Appointments       Timeframe:  Long-Range Goal Priority:  High Start Date:   06/29/20                        Expected End Date:  09/29/20                   Follow  Up Date 09/21/20   - Attend PCP appointment on 09/15/20 @ 1:45 and Nutrition and Diabetes on 09/22/20 @ 10:30am  - arrange a ride through an agency 1 week before appointment, call [phone] for medical transportation provided by [facility] -contact [employer] for free phone benefits and over-the-counter medication benefits [phone] - call to cancel if needed - keep a calendar with prescription refill dates - keep a calendar with appointment dates    Why is this important?   Part of staying healthy is seeing the doctor for follow-up care.  If you forget your appointments, there are some things you can do to stay on track.            Please see education materials related to CGM provided as print materials.   Patient verbalizes understanding of instructions provided today.   Telephone follow up appointment with Managed Medicaid care management team member scheduled for:09/21/20 @ 12:30pm  Estanislado EmmsMelanie Alyia Lacerte RN, BSN  [place]  [facility] RN Care Coordinator   Following is a copy of your plan of care:  Patient Care Plan: General Plan of Care (Adult)     Problem Identified: Interdisciplinary Collaborative/Care Coordination   Priority: High  Onset Date: 06/11/2020     Long-Range Goal: Patient-Specific Goal   Start Date: 06/11/2020  Expected End Date: 09/10/2020  This Visit's Progress: On track  Priority: High  Note:   Current Barriers:  Care Management support, education, and care coordination needs related to DM Case Manager Clinical Goal(s):  patient will work with BSW to address needs related to Level of care concerns and ADL IADL limitations in patient with DM Interventions:  Collaborated with BSW to initiate plan of care to address needs related to Level of care concerns and ADL IADL limitations in patient with DM Self Care Activities:  Patient will attend all scheduled provider appointments Patient will work with BSW to address care coordination needs and  will continue to work with the clinical team to address health care and disease management related needs.   Patient Goals: - schedule and keep appointment for annual check-up  - work with [name], BSW for assistance with arranging [facility] for healthcare management Follow up plan:  The Managed Medicaid care management team will reach out to the patient again over the next 14 days.      Problem Identified: Health Promotion or Disease Self-Management (General Plan of Care)      Long-Range Goal: Self-Management Plan Developed   Start Date: 06/29/2020  Expected End Date: 09/29/2020  Recent Progress: On track  Priority: High  Note:   Current Barriers:  Ineffective Self Health Maintenance-RNCM spoke with [name], patient's grandmother, today. She was not at home and has the only phone. She reports not hearing anything regarding the DME ordered by PCP on 5/20. It looks like a second order was placed for walker on 6/13. She is unsure if [name] is using the CGM and requested a call back in 2 hours. RNCM returned call to speak with Moyses. 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 He is currently checking BS with glucometer, readings 250-270 preprandial and greater than 300 postprandial. He reports taking his insulin as prescribed. Reports that he has cut back on fast food and is eating more fruits and vegetables.-Update-Today Keshav reports using Dexcom last, one week ago. He is not using it due to needing "a part that goes to it". RNCM discussed the different parts and explained that he has refills for sensors. Thelma's Diabetic Education appointment was cancelled due to insurance coverage. Grandmother misunderstood and thought that he needed to find a new PCP. Patient has received a new walker and shower chair. 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  Knowledge Deficits related to short term plan for care  coordination needs and long term plans for chronic disease management needs Nurse Case Manager Clinical Goal(s):  patient will work with care management team to address care coordination and chronic disease management needs related to Disease Management   Interventions:  Evaluation of current treatment plan related to [name] and CIDP and patient's adherence to plan as established by provider. Collaborated with Lupita Leash Plyler regarding cancelled Diabetic Education, she provided [facility] with NDES number [phone]) to call and schedule an appointment Called Internal Medicine [phone] and scheduled an appointment for follow up 09/15/20 @ 1:45pm Called Nutrition and Dietician [phone] and scheduled first available appointment  on 09/22/20 @ 10:30am ([address], [address]) Advised patient and Grandmother to attend both appointments and to take all medications and pieces of dexcom to both upcoming appointments. Discussed plans with patient for ongoing care management follow up and provided patient with direct contact information for care management team RNCM will follow closely Discussed the importance of taking his medications consistently at prescribed times Discussed benefits provided by [facility] and encouraged patient to call [phone] and inquire Self Care Activities:  Patient will self administer medications as prescribed Patient will attend all scheduled provider appointments Patient will call pharmacy for medication refills Patient will call provider office for new concerns or questions Patient Goals: - adhere to a diabetic diet, eat three small meals a day with protein rich snacks - change to whole grain breads, cereal, pasta - drink 6 to 8 glasses of water each day - limit fast food meals to no more than 1 per week - set a realistic goal - switch to low-fat or skim milk  - arrange a ride through an agency 1 week before appointment, call [phone] for medical  transportation provided by [facility] -contact [employer] for free phone benefits and over-the-counter medication benefits [phone] - call to cancel if needed - keep a calendar with prescription refill dates - keep a calendar with appointment dates  Follow Up Plan: Telephone follow up appointment with care management team member scheduled for:09/21/20 @ 12:30pm     Patient Care Plan: General Social Work (Adult)     Problem Identified: HH Services   Onset Date: 06/08/2020  Note:   Erick Murin is a 20 y.o. year old male who sees Skinner-Kiser, Jeannette Corpus, NP for primary care. The  [facility] Managed Care team was consulted for assistance with Limited access to food ADL IADL limitations. Mr. Sliker was given information about Care Management services, agreed to services, and verbal consent for services was obtained.  Interventions:  Patient interviewed and appropriate assessments performed Collaborated with clinical team regarding patient needs  SDOH (Social Determinants of Health) assessments performed: Yes     Collaborated with [facility] for [facility] referral BSW contacted patient and spoke with grandmother. She stated that due to patients neuropathy it makes it difficult for him to do a lot of things on his own. Grandmother states he does need assistance with bathing, with the braces on his legs it makes it difficult for him. Patient keeps a jug in his room for toileting. Grandmother states she does the best she can, but assistance is needed. BSW contacted [facility] and spoke with [name], a referral for [facility] was submitted and [employer] will reach out to patient to complete an assessment.  Grandmother stated she has applied for disability for patient but has not received an answer yet, grandmother is the only income in the household and food is getting hard to keep in the home. Grandmother states she has completed an application for foodstamps for patient and is waiting for a response. BSW provided  grandmother with food pantry resources.  Update 06/28/20: BSW contacted patient and spoke with patient and grandmother. They both stated that they have not heard anything from [facility] about getting an assessment completed. BSW will contact [employer] to check on referral. Patient stated that they have started receiving foodstamps and have not heard anything about disabliity. Update 07/16/20: BSW spoke with patients grandmother today. She stated they still have not heard anything from disabliity nor [employer]. BSW contact [employer] and did not get a rep. BSW will continue  to contact for [facility] referral. Patient's mother wanted information on being paid to take care of patient.   Plan:  Over the next 30-60 days, patient will work with BSW to address needs related to ADL IADL limitations Social Worker will follow up in 14 days.   Gus Puma, BSW, [place] [facility]  [place]  High Risk Managed Medicaid Team          Patient Care Plan: Medication Management     Problem Identified: Health Promotion or Disease Self-Management (General Plan of Care)      Goal: Medication Management   Note:   Current Barriers:  Unable to independently monitor therapeutic efficacy Unable to self administer medications as prescribed Does not adhere to prescribed medication regimen Does not maintain contact with provider office Does not contact provider office for questions/concerns   Pharmacist Clinical Goal(s):  Over the next 30 days, patient will achieve adherence to monitoring guidelines and medication adherence to achieve therapeutic efficacy contact provider office for questions/concerns as evidenced notation of same in electronic health record through collaboration with PharmD and provider.    Interventions: Inter-disciplinary care team collaboration (see longitudinal plan of care) Comprehensive medication review performed; medication list updated in electronic medical  record    Patient Goals/Self-Care Activities Over the next 30 days, patient will:  - take medications as prescribed collaborate with provider on medication access solutions  Follow Up Plan: The care management team will reach out to the patient again over the next 30 days.

## 2020-09-07 NOTE — Patient Outreach (Signed)
Due for meds on 09/15/20  Trulicity   0.75/0.5  Humalog   200units/ml  Tresiba Flextouch  U-200  BD Ultra-Fine Nano pen needle  32G 5/32"

## 2020-09-15 ENCOUNTER — Encounter: Payer: Medicaid Other | Admitting: Student

## 2020-09-20 ENCOUNTER — Encounter: Payer: Medicaid Other | Admitting: Internal Medicine

## 2020-09-21 ENCOUNTER — Other Ambulatory Visit: Payer: Self-pay | Admitting: *Deleted

## 2020-09-21 ENCOUNTER — Encounter: Payer: Medicaid Other | Admitting: Student

## 2020-09-21 NOTE — Patient Outreach (Signed)
Care Coordination  09/21/2020  [name] 2000/02/18 [id number]   Medicaid Managed Care   Unsuccessful Outreach Note  09/21/2020 Name: Lucas Cunningham MRN: [id number] DOB: August 28, 2000  Referred by: Steffanie Rainwater, MD Reason for referral : High Risk Managed Medicaid (Unsuccessful RNCM follow up telephone outreach)   An unsuccessful telephone outreach was attempted today. The patient was referred to the case management team for assistance with care management and care coordination.   Follow Up Plan: A HIPAA compliant phone message was left for the patient providing contact information and requesting a return call.   [name] RN, BSN [place]  [employer]

## 2020-09-21 NOTE — Patient Instructions (Signed)
Visit Information  [name]  - as a part of your Medicaid benefit, you are eligible for care management and care coordination services at no cost or copay. I was unable to reach you by phone today but would be happy to help you with your health related needs. Please feel free to call me @ [phone].   A member of the Managed Medicaid care management team will reach out to you again over the next 21 days.   [name] RN, BSN [place]  [employer]

## 2020-09-22 ENCOUNTER — Encounter: Payer: Medicaid Other | Admitting: Student

## 2020-09-22 ENCOUNTER — Ambulatory Visit: Payer: Medicaid Other | Admitting: Nutrition

## 2020-09-23 ENCOUNTER — Other Ambulatory Visit: Payer: Self-pay | Admitting: *Deleted

## 2020-09-23 ENCOUNTER — Ambulatory Visit: Payer: Medicaid Other

## 2020-09-23 ENCOUNTER — Other Ambulatory Visit: Payer: Self-pay

## 2020-09-23 NOTE — Patient Outreach (Signed)
Medicaid Managed Care   Nurse Care Manager Note  09/23/2020 Name:  Lucas Cunningham MRN:  [id number] DOB:  03-May-2000  Lucas Cunningham is an 20 y.o. year old male who is a primary patient of A[place], Lucas March, MD.  The Medicaid Managed Care Coordination team was consulted for assistance with:    [name]  Lucas Cunningham was given information about Medicaid Managed Care Coordination team services today. Lucas Cunningham  DPR/Lucas Cunningham agreed to services and verbal consent obtained.  Engaged with patient by telephone for follow up visit in response to provider referral for case management and/or care coordination services.   Assessments/Interventions:  Review of past medical history, allergies, medications, health status, including review of consultants reports, laboratory and other test data, was performed as part of comprehensive evaluation and provision of chronic care management services.  SDOH (Social Determinants of Health) assessments and interventions performed:   Care Plan  No Known Allergies  Medications Reviewed Today     Reviewed by Lucas Montane, RN (Registered Nurse) on 09/23/20 at [phone]  Med List Status: <None>   Medication Order Taking? Sig Documenting Provider Last Dose Status Informant  Accu-Chek FastClix Lancets MISC [id number] No 1 Device by Does not apply route as directed. Use with Fastclix lancet device to check blood sugar up to 8x daily. (diagnosis code E10.65) Sherrlyn Hock, MD Unknown Active   acetone, urine, test strip [id number] No 1 strip by Does not apply route as needed for high blood sugar. Up to 8x daily if necessary  Patient not taking: No sig reported   Sherrlyn Hock, MD Not Taking Active   atorvastatin (LIPITOR) 10 MG tablet [id number] No Take 1 tablet (10 mg total) by mouth daily. Sanjuan Dame, MD Taking Active   Continuous Blood Gluc Receiver (DEXCOM G6 RECEIVER) DEVI [id number] No 1 Device by Does not apply route as directed.  Patient not  taking: No sig reported   Sherrlyn Hock, MD Unknown Active   Continuous Blood Gluc Receiver (DEXCOM G6 RECEIVER) DEVI [id number] No USE 1 DEVICE AS DIRECTED.  Patient not taking: No sig reported   Sherrlyn Hock, MD Unknown Active   Continuous Blood Gluc Sensor (DEXCOM G6 SENSOR) MISC [id number] No INJECT 1 APPLICATOR INTO THE SKIN AS DIRECTED. (CHANGE SENSOR EVERY 10 DAYS) Sherrlyn Hock, MD Unknown Active   Continuous Blood Gluc Sensor (DEXCOM G6 SENSOR) MISC [id number] No Inject 1 applicator into the skin as directed. (change sensor every 10 days) Al Corpus, MD Unknown Active   Continuous Blood Gluc Transmit (DEXCOM G6 TRANSMITTER) MISC [id number] No INJECT 1 DEVICE INTO THE SKIN AS DIRECTED. (RE-USE UP TO 8X WITH EACH NEW SENSOR) Sherrlyn Hock, MD Unknown Active   Continuous Blood Gluc Transmit (DEXCOM G6 TRANSMITTER) MISC [id number] No Inject 1 Device into the skin as directed. (re-use up to 8x with each new sensor) Al Corpus, MD Unknown Active   Dulaglutide (TRULICITY) 0.30 SP/2.3RA SOPN [id number] No Inject 0.75 mg into the skin once a week. Al Corpus, MD Taking Active   gabapentin (NEURONTIN) 300 MG capsule [id number] No Take 1 capsule (300 mg total) by mouth 2 (two) times daily. Sanjuan Dame, MD Taking Active   Glucagon (BAQSIMI TWO PACK) 3 MG/DOSE POWD [id number] No Place 1 spray into the nose as directed.  Patient not taking: Reported on 09/01/2020   Sherrlyn Hock, MD Not Taking Active   Glucose Blood (BLOOD GLUCOSE TEST STRIPS) STRP [id number] No 1 Device by Other route as directed.  To check blood sugar up to 8x per day Sherrlyn Hock, MD Unknown Active   insulin glargine (LANTUS) 100 unit/mL SOPN [id number] No Inject up to 100 units subcutaneously daily as instructed.  Patient not taking: Reported on 09/01/2020   Al Corpus, MD Not Taking Active   insulin lispro (HUMALOG KWIKPEN) 200 UNIT/ML KwikPen [id number] No Inject up to 100 units daily  per provider instructions Al Corpus, MD Taking Active   Insulin Pen Needle (BD PEN NEEDLE NANO U/F) 32G X 4 MM MISC [id number] No Inject up to 8 times per day. Al Corpus, MD Taking Active   Insulin Regular Human [facility]) 4 & 8 & 12 units POWD [id number] No Inhale up to 96 units daily  Patient not taking: No sig reported   Sherrlyn Hock, MD Not Taking Active   Insulin Regular Human 4 & 8 & 12 units POWD [id number] No INHALE UP TO 96 UNITS DAILY  Patient not taking: No sig reported   Sherrlyn Hock, MD Not Taking Active   ketoconazole (NIZORAL) 2 % cream [id number] No Apply to fet daily.  Patient not taking: No sig reported   Sherrlyn Hock, MD Not Taking Active   Lancets Misc. (ACCU-CHEK FASTCLIX LANCET) KIT [id number] No 1 Device by Does not apply route as directed. Use with lancets to check blood sugar up to 8x daily.  (diagnosis code E10.65)  Patient taking differently: 1 Device by Does not apply route as directed. Use with lancets to check blood sugar up to 8x daily.  (diagnosis code E10.65)   Sherrlyn Hock, MD Unknown Active   ondansetron (ZOFRAN ODT) 8 MG disintegrating tablet [id number] No Take 1 tablet (8 mg total) by mouth every 8 (eight) hours as needed for nausea or vomiting.  Patient not taking: No sig reported   Al Corpus, MD Not Taking Active   ondansetron (ZOFRAN-ODT) 8 MG disintegrating tablet [id number] No TAKE 1 TABLET (8 MG TOTAL) BY MOUTH EVERY 8 (EIGHT) HOURS AS NEEDED FOR NAUSEA OR VOMITING.  Patient not taking: No sig reported   Al Corpus, MD Not Taking Active   PRESCRIPTION MEDICATION [id number] No Inject into the vein. IVIG every 4 weeks. Order for new start sent to [facility] on 04/08/2020. Lucas Beam, MD Taking Active             Patient Active Problem List   Diagnosis Date Noted   CIDP (chronic inflammatory demyelinating polyneuropathy) ([place]) 05/25/2020   Noncompliance with medication treatment due to  difficulty with dosing 05/25/2020   Mixed hyperlipidemia 05/25/2020   Depression 05/25/2020   DKA, type 1 ([place]) 10/23/2019   Diabetic polyneuropathy associated with type 1 diabetes mellitus ([place]) 02/26/2019   Leg weakness, bilateral 02/25/2019   Postprandial bloating 02/25/2019   Sensory problems with limbs 02/25/2019   Ketonuria 11/04/2018   Diabetes ([place]) 04/23/2018   Uncontrolled type 1 diabetes mellitus with hyperglycemia ([place]) 05/24/2017   Morbid obesity ([place]) 05/24/2017   Insulin resistance 05/24/2017   Acanthosis 05/24/2017    Conditions to be addressed/monitored per PCP order:   [name]  Care Plan : General Plan of Care (Adult)  Updates made by Lucas Montane, RN since 09/23/2020 12:00 AM     Problem: Health Promotion or Disease Self-Management (General Plan of Care)      Long-Range Goal: Self-Management Plan Developed   Start Date: 06/29/2020  Expected End Date: 10/08/2020  Recent Progress: On track  Priority: High  Note:   Current Barriers:  Ineffective Self Health Maintenance-RNCM spoke with Lucas Cunningham, patient's grandmother, today. She was not at home and has the only phone. She reports not hearing anything regarding the DME ordered by PCP on 5/20. It looks like a second order was placed for Cunningham on 6/13. She is unsure if Lucas Cunningham is using the CGM and requested a call back in 2 hours. RNCM returned call to speak with Lucas Cunningham. 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 He is currently checking BS with glucometer, readings 250-270 preprandial and greater than 300 postprandial. He reports taking his insulin as prescribed. Reports that he has cut back on fast food and is eating more fruits and vegetables.-Update-Today Lucas Cunningham reports using Dexcom last, one week ago. He is not using it due to needing "a part that goes to it". RNCM discussed the different parts and explained that he has refills for sensors. Lucas Cunningham's Diabetic Education appointment was cancelled due to insurance  coverage. Grandmother misunderstood and thought that he needed to find a new PCP. Patient has received a new Cunningham and shower chair. 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  Knowledge Deficits related to short term plan for care coordination needs and long term plans for chronic disease management needs Nurse Case Manager Clinical Goal(s):  patient will work with care management team to address care coordination and chronic disease management needs related to Disease Management   Interventions:  Evaluation of current treatment plan related to [name] and CIDP and patient's adherence to plan as established by provider. Advised to attend Internal Medicine [phone] rescheduled appointment for follow up 09/27/20 @ 1:15pm Advised to call and reschedule missed appointment with Nutrition and Dietician [phone] ([address], [address]) Provided patient with Dexcom customer service number [phone] to call re: charger Advised patient and Grandmother to attend both appointments and to take all medications and pieces of dexcom to both upcoming appointments. Discussed plans with patient for ongoing care management follow up and provided patient with direct contact information for care management team RNCM will follow closely Discussed the importance of taking his medications consistently at prescribed times Discussed benefits provided by [facility] and encouraged patient to call [phone] and inquire Self Care Activities:  Patient will self administer medications as prescribed Patient will attend all scheduled provider appointments Patient will call pharmacy for medication refills Patient will call provider office for new concerns or questions Patient Goals:  - adhere to a diabetic diet, eat three small meals a day with protein rich snacks - change to whole grain breads, cereal, pasta - drink  6 to 8 glasses of water each day - limit fast food meals to no more than 1 per week - set a realistic goal - switch to low-fat or skim milk  - Attend PCP appointment on 09/27/20 @ 1:15 and reschedule Nutrition and Diabetes [phone] appointment ([address], [address]) - arrange a ride through an agency 1 week before appointment, call [phone] for medical transportation provided by [facility] -contact [employer] for free phone benefits and over-the-counter medication benefits [phone] - call to cancel if needed - keep a calendar with prescription refill dates - keep a calendar with appointment dates  Follow Up Plan: Telephone follow up appointment with care management team member scheduled for:10/08/20 @ 9am      Follow Up:  Patient agrees to Care Plan and Follow-up.  Plan: The Managed Medicaid care management team will reach out to the  patient again over the next 15 days.  Date/time of next scheduled RN care management/care coordination outreach:  10/08/20 @ [place] RN, [place] RN Care Coordinator

## 2020-09-23 NOTE — Patient Instructions (Signed)
Visit Information  Lucas Cunningham was given information about Medicaid Managed Care team care coordination services as a part of their [facility] Medicaid benefit. Lucas Cunningham verbally consented to engagement with the [facility] Managed Care team.   If you are experiencing a medical emergency, please call 911 or report to your local emergency department or urgent care.   If you have a non-emergency medical problem during routine business hours, please contact your provider's office and ask to speak with a nurse.   For questions related to your [facility] health plan, please call: [phone] or go here:[email or web address]  If you would like to schedule transportation through your [facility] plan, please call the following number at least 2 days in advance of your appointment: [phone].  Call the [facility] Crisis Line at [phone], at any time, 24 hours a day, 7 days a week. If you are in danger or need immediate medical attention call 911.  If you would like help to quit smoking, call 1-800-QUIT-NOW (724-596-5352) OR Espaol: 1-855-Djelo-Ya (4-132-440-1027) o para ms informacin haga clic aqu or Text READY to 253-664 to register via text  Lucas Cunningham - following are the goals we discussed in your visit today:   Goals Addressed             This Visit's Progress    Eat Healthy       Timeframe:  Long-Range Goal Priority:  High Start Date:  06/29/20                             Expected End Date:  10/08/20                  Follow Up Date 10/08/20   - adhere to a diabetic diet, eat three small meals a day with protein rich snacks - change to whole grain breads, cereal, pasta - drink 6 to 8 glasses of water each day - limit fast food meals to no more than 1 per week - set a realistic goal - switch to low-fat or skim milk    Why is this important?   When you are ready to manage your nutrition or weight, having a plan and setting goals will help.   Taking small steps to change how you eat and exercise is a good place to start.         Make and Keep All Appointments       Timeframe:  Long-Range Goal Priority:  High Start Date:   06/29/20                        Expected End Date:  10/08/20                   Follow Up Date 10/08/20   - Attend PCP appointment on 09/27/20 @ 1:15 and reschedule Nutrition and Diabetes [phone] appointment ([address], [address]) - arrange a ride through an agency 1 week before appointment, call [phone] for medical transportation provided by [facility] -contact [employer] for free phone benefits and over-the-counter medication benefits [phone] - call to cancel if needed - keep a calendar with prescription refill dates - keep a calendar with appointment dates    Why is this important?   Part of staying healthy is seeing the doctor for follow-up care.  If you forget your appointments, there are some things you can do to stay on track.  Protect My Health       Timeframe:  Long-Range Goal Priority:  High Start Date:  06/11/20                           Expected End Date: 10/08/20                      Follow Up Date  10/08/20   - schedule and keep appointment for annual check-up  - work with [name], BSW for assistance with arranging [facility] for healthcare management   Why is this important?   Screening tests can find diseases early when they are easier to treat.  Your doctor or nurse will talk with you about which tests are important for you.  Getting shots for common diseases like the flu and shingles will help prevent them.             Please see education materials related to diabetes provided as print materials.   The patient verbalized understanding of instructions provided today and agreed to receive a mailed copy of patient instruction and/or educational materials.  Telephone follow up appointment with Managed Medicaid care management team member scheduled  for:10/08/20 @ 9am  Lucas Emms RN, BSN [place]  [facility] RN Care Coordinator   Following is a copy of your plan of care:  Patient Care Plan: General Plan of Care (Adult)     Problem Identified: Interdisciplinary Collaborative/Care Coordination   Priority: High  Onset Date: 06/11/2020     Long-Range Goal: Patient-Specific Goal   Start Date: 06/11/2020  Expected End Date: 09/10/2020  This Visit's Progress: On track  Priority: High  Note:   Current Barriers:  Care Management support, education, and care coordination needs related to DM Case Manager Clinical Goal(s):  patient will work with BSW to address needs related to Level of care concerns and ADL IADL limitations in patient with DM Interventions:  Collaborated with BSW to initiate plan of care to address needs related to Level of care concerns and ADL IADL limitations in patient with DM Self Care Activities:  Patient will attend all scheduled provider appointments Patient will work with BSW to address care coordination needs and will continue to work with the clinical team to address health care and disease management related needs.   Patient Goals: - schedule and keep appointment for annual check-up  - work with [name], BSW for assistance with arranging [facility] for healthcare management Follow up plan:  The Managed Medicaid care management team will reach out to the patient again over the next 14 days.      Problem Identified: Health Promotion or Disease Self-Management (General Plan of Care)      Long-Range Goal: Self-Management Plan Developed   Start Date: 06/29/2020  Expected End Date: 10/08/2020  Recent Progress: On track  Priority: High  Note:   Current Barriers:  Ineffective Self Health Maintenance-RNCM spoke with Lucas Cunningham, patient's grandmother, today. She was not at home and has the only phone. She reports not hearing anything regarding the DME ordered by PCP on 5/20. It looks like a second  order was placed for walker on 6/13. She is unsure if [name] is using the CGM and requested a call back in 2 hours. RNCM returned call to speak with Lucas Cunningham. 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 He is currently checking BS with glucometer, readings 250-270 preprandial and greater than 300 postprandial. He reports taking his insulin  as prescribed. Reports that he has cut back on fast food and is eating more fruits and vegetables.-Update-Today Lucas Cunningham reports using Dexcom last, one week ago. He is not using it due to needing "a part that goes to it". RNCM discussed the different parts and explained that he has refills for sensors. Lucas Cunningham's Diabetic Education appointment was cancelled due to insurance coverage. Grandmother misunderstood and thought that he needed to find a new PCP. Patient has received a new walker and shower chair. 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  Knowledge Deficits related to short term plan for care coordination needs and long term plans for chronic disease management needs Nurse Case Manager Clinical Goal(s):  patient will work with care management team to address care coordination and chronic disease management needs related to Disease Management   Interventions:  Evaluation of current treatment plan related to [name] and CIDP and patient's adherence to plan as established by provider. Advised to attend Internal Medicine [phone] rescheduled appointment for follow up 09/27/20 @ 1:15pm Advised to call and reschedule missed appointment with Nutrition and Dietician [phone] ([address], [address]) Provided patient with Dexcom customer service number [phone] to call re: charger Advised patient and Grandmother to attend both appointments and to take all medications and pieces of dexcom to both upcoming appointments. Discussed plans with patient for ongoing care  management follow up and provided patient with direct contact information for care management team RNCM will follow closely Discussed the importance of taking his medications consistently at prescribed times Discussed benefits [facility] and encouraged patient to call [phone] and inquire Self Care Activities:  Patient will self administer medications as prescribed Patient will attend all scheduled provider appointments Patient will call pharmacy for medication refills Patient will call provider office for new concerns or questions Patient Goals:  - adhere to a diabetic diet, eat three small meals a day with protein rich snacks - change to whole grain breads, cereal, pasta - drink 6 to 8 glasses of water each day - limit fast food meals to no more than 1 per week - set a realistic goal - switch to low-fat or skim milk  - Attend PCP appointment on 09/27/20 @ 1:15 and reschedule Nutrition and Diabetes [phone] appointment ([address], [address]) - arrange a ride through an agency 1 week before appointment, call [phone] [facility] -contact [employer] for free phone benefits and over-the-counter medication benefits [phone] - call to cancel if needed - keep a calendar with prescription refill dates - keep a calendar with appointment dates  Follow Up Plan: Telephone follow up appointment with care management team member scheduled for:10/08/20 @ 9am     Patient Care Plan: General Social Work (Adult)     Problem Identified: HH Services   Onset Date: 06/08/2020  Note:   Lucas Cunningham is a 20 y.o. year old male who sees Skinner-Kiser, Jeannette CorpusKawanna Torrie, NP for primary care. [facility] was consulted for assistance with Limited access to food ADL IADL limitations. Lucas Cunningham was given information about Care Management services, agreed to services, and verbal consent for services was  obtained.  Interventions:  Patient interviewed and appropriate assessments performed Collaborated with clinical team regarding patient needs  SDOH (Social Determinants of Health) assessments performed: Yes     [facility] BSW contacted patient and spoke with grandmother.  She stated that due to patients neuropathy it makes it difficult for him to do a lot of things on his own. Grandmother states he does need assistance with bathing, with the braces on his legs it makes it difficult for him. Patient keeps a jug in his room for toileting. Grandmother states she does the best she can, but assistance is needed. BSW contacted [facility] and spoke with [name], a referral for [facility] was submitted and [employer] will reach out to patient to complete an assessment.  Grandmother stated she has applied for disability for patient but has not received an answer yet, grandmother is the only income in the household and food is getting hard to keep in the home. Grandmother states she has completed an application for foodstamps for patient and is waiting for a response. BSW provided grandmother with food pantry resources.  Update 06/28/20: BSW contacted patient and spoke with patient and grandmother. They both stated that they have not heard anything from [facility] about getting an assessment completed. BSW will contact [employer] to check on referral. Patient stated that they have started receiving foodstamps and have not heard anything about disabliity. Update 07/16/20: BSW spoke with patients grandmother today. She stated they still have not heard anything from disabliity nor [employer]. BSW contact [employer] and did not get a rep. BSW will continue to contact for PCS referral. Patient's mother wanted information on being paid to take care of patient.   Plan:  Over the next 30-60 days, patient will work with BSW to address needs related to ADL IADL limitations Social Worker will follow up in 14 days.    Lucas Cunningham, BSW, [place] [facility]  [place]  High Risk Managed Medicaid Team          Patient Care Plan: Medication Management     Problem Identified: Health Promotion or Disease Self-Management (General Plan of Care)      Goal: Medication Management   Note:   Current Barriers:  Unable to independently monitor therapeutic efficacy Unable to self administer medications as prescribed Does not adhere to prescribed medication regimen Does not maintain contact with provider office Does not contact provider office for questions/concerns   Pharmacist Clinical Goal(s):  Over the next 30 days, patient will achieve adherence to monitoring guidelines and medication adherence to achieve therapeutic efficacy contact provider office for questions/concerns as evidenced notation of same in electronic health record through collaboration with PharmD and provider.    Interventions: Inter-disciplinary care team collaboration (see longitudinal plan of care) Comprehensive medication review performed; medication list updated in electronic medical record    Patient Goals/Self-Care Activities Over the next 30 days, patient will:  - take medications as prescribed collaborate with provider on medication access solutions  Follow Up Plan: The care management team will reach out to the patient again over the next 30 days.

## 2020-09-27 ENCOUNTER — Ambulatory Visit (INDEPENDENT_AMBULATORY_CARE_PROVIDER_SITE_OTHER): Payer: Medicaid Other | Admitting: Internal Medicine

## 2020-09-27 ENCOUNTER — Encounter: Payer: Self-pay | Admitting: Internal Medicine

## 2020-09-27 ENCOUNTER — Other Ambulatory Visit: Payer: Self-pay

## 2020-09-27 VITALS — BP 136/92 | HR 93 | Temp 98.7°F | Resp 24 | Ht 72.0 in | Wt 283.7 lb

## 2020-09-27 DIAGNOSIS — G6181 Chronic inflammatory demyelinating polyneuritis: Secondary | ICD-10-CM

## 2020-09-27 DIAGNOSIS — E782 Mixed hyperlipidemia: Secondary | ICD-10-CM | POA: Diagnosis present

## 2020-09-27 DIAGNOSIS — E1065 Type 1 diabetes mellitus with hyperglycemia: Secondary | ICD-10-CM

## 2020-09-27 LAB — POCT GLYCOSYLATED HEMOGLOBIN (HGB A1C): Hemoglobin A1C: 13.3 % — AB (ref 4.0–5.6)

## 2020-09-27 LAB — GLUCOSE, CAPILLARY: Glucose-Capillary: 314 mg/dL — ABNORMAL HIGH (ref 70–99)

## 2020-09-27 MED ORDER — ATORVASTATIN CALCIUM 10 MG PO TABS: 10.0000 mg | ORAL_TABLET | Freq: Every day | ORAL | 0 refills | Status: DC

## 2020-09-27 MED ORDER — TRULICITY 0.75 MG/0.5ML ~~LOC~~ SOAJ: 0.7500 mg | SUBCUTANEOUS | 6 refills | Status: DC

## 2020-09-27 NOTE — Patient Instructions (Addendum)
[name], it was a pleasure seeing you today!  Today we discussed: Diabetes-  I am rechecking your blood work today to see what your A1c is.  I would like you to see an ophthalmologist, I've made that referral and you will be called to schedule that.  With diabetes, the small blood vessels in your eye can be damaged over time due to high blood sugars. Please continue to check your feet daily.  I have sent a note to our pharmacist to see if she has suggestions for Insulin pens that are easier to inject. I want to follow-up on Thursday to review your medications.  CIPD- Call your neurologist to see when you need to follow-up for this. Their # is [phone].  Follow-up in one month.  We will continue working towards decreasing your A1c. Please bring your Dexcom in at that visit.  It is important that we have that information so that we can treat your diabetes better.  I have ordered the following labs today:   Lab Orders         Hemoglobin A1c      Tests ordered today:  A1c  Referrals ordered today:   Referral Orders  No referral(s) requested today     I have ordered the following medication/changed the following medications:   Stop the following medications: Medications Discontinued During This Encounter  Medication Reason   Dulaglutide (TRULICITY) 0.75 MG/0.5ML SOPN Reorder   atorvastatin (LIPITOR) 10 MG tablet Reorder     Start the following medications: Meds ordered this encounter  Medications   Dulaglutide (TRULICITY) 0.75 MG/0.5ML SOPN    Sig: Inject 0.75 mg into the skin once a week.    Dispense:  2 mL    Refill:  6   atorvastatin (LIPITOR) 10 MG tablet    Sig: Take 1 tablet (10 mg total) by mouth daily.    Dispense:  90 tablet    Refill:  0     Follow-up:  3 days    Please make sure to arrive 15 minutes prior to your next appointment. If you arrive late, you may be asked to reschedule.   We look forward to seeing you next time. Please call our clinic  at [phone] if you have any questions or concerns. The best time to call is Monday-Friday from 9am-4pm, but there is someone available 24/7. If after hours or the weekend, call the main hospital number and ask for the Internal Medicine Resident On-Call. If you need medication refills, please notify your pharmacy one week in advance and they will send [name] a request.  Thank you for letting us take part in your care. Wishing you the best!  Thank you, Dr. [facility]

## 2020-09-27 NOTE — Assessment & Plan Note (Signed)
Patient presents with history of chronic inflammatory demyelinating polyneuropathy.  Diagnosed in 01/21 and has been receiving monthly IVIG treatment.  He was receiving Physical Therapy, which he found helpful, but stopped receiving PT about 2 months ago. Patient was unsure of when next appointment with neurology was.  PLAN: -Follow-up on neurology recommendations.  Patient was unsure of next appointment, [facility] phone number was given. -Referral to PT

## 2020-09-27 NOTE — Assessment & Plan Note (Addendum)
Medications:  Lispro 30 TID, Degludec 100 units nightly, and Dulaglutide 0.75 once weekly Glucose-Capillary  Date/Time Value Ref Range Status  09/27/2020 02:39 PM 314 (H) 70 - 99 mg/dL Final    Comment:    Glucose reference range applies only to samples taken after fasting for at least 8 hours.    Last A1c was  Lab Results  Component Value Date   HGBA1C 13.3 (A) 09/27/2020  .   A/P: 1. Counseling: A. Patient presents with history of non-adherence with medications due to difficulty with injecting secondary to polyneuropathy.  His Grandmother injects his medications for him and also has difficulty with the humalog injection.  At first, patient confirmed that he was taking Degludec 100 units nightly, but later stated he was taking 20 units nightly. He endorses taking Lispro 30 units TID and dulaglutide once weekly.   2. Medications: Lispro 30 units TID, Degludec 100 units nightly, and dulaglutide 0.75 mg weekly 3. Goals:  A. daily monitoring and recording of CBG, he has a dexcom. B. CBG - 100-200  C. Hgb A1c: <7 4. Referrals: opthalmology 5. Follow-up: Would like to increase dosage of diabetic medications, however patient does not seem to be sure of what dosages he is on.  Patient reported being on Degludec, however this was not on his medication list.  Lantus was also on his medication list, but he did not mention taking.  It is possible that patient meant that he is taking Lantus rather than Degludec. On chart review, Lantus has been ordered 07/22 with refills.  Will follow-up via tele-health on 09/30/20 to review medications.  Once the medications and their doses are confirmed, I will titrate doses.  Repeat A1c today.  He was following with a pediatric endocrinologist, but has not established with an adult endocrinologist.   ADDENDUM: A1c 09/27/20 A1c= 13.3.  Will call via telehealth on 09/29/20 to review medications and update on A1c result.

## 2020-09-27 NOTE — Progress Notes (Signed)
   CC: follow-up on diabetes and Chronic Inflammatory Demyelinating Polyneuropathy  HPI:  [name] is a 20 y.o. with medical history as below presenting to [facility] for follow-up on diabetes and Chronic Inflammatory Demyelinating Polyneuropathy.  Please see problem-based list for further details, assessments, and plans.  Past Medical History:  Diagnosis Date   CIDP (chronic inflammatory demyelinating polyneuropathy) (HCC)    Diabetes mellitus without complication (HCC)    type 1    Review of Systems:   Constitutional: denies weight change, fatigue, and dizziness Gastrointestinal: denies nausea, vomiting, and constipation Psychiatric/Behavioral: denies decreased interest in activity or changes in diet  Physical Exam:  Vitals:   09/27/20 1326  BP: (!) 136/92  Pulse: 93  Resp: (!) 24  Temp: 98.7 F (37.1 C)  TempSrc: Oral  SpO2: 99%  Weight: 283 lb 11.2 oz (128.7 kg)  Height: 6' (1.829 m)    General: well-developed, well-nourished, sitting comfortably in wheelchair HENT: NCAT, acanthosis nigrans present at anterior surface of neck Eyes: no scleral icterus, conjunctiva clear CV: no m,r,g, normal rate Pulm: CTAB, pulmonary effort normal GI:no tenderness present, bowel sounds normal MSK: diminished muscle strength 3/5 in hip flexion and knee flexion, decreased sensation in L4,L5, and S1 bilaterally Skin: warm and dry, skin turgor normal Psych: Normal mood and affect  Assessment & Plan:   See Encounters Tab for problem based charting.  Patient seen with Dr. Criselda Peaches

## 2020-09-30 ENCOUNTER — Ambulatory Visit (INDEPENDENT_AMBULATORY_CARE_PROVIDER_SITE_OTHER): Payer: Medicaid Other | Admitting: Internal Medicine

## 2020-09-30 ENCOUNTER — Telehealth: Payer: Self-pay | Admitting: *Deleted

## 2020-09-30 DIAGNOSIS — E1065 Type 1 diabetes mellitus with hyperglycemia: Secondary | ICD-10-CM

## 2020-09-30 MED ORDER — TRESIBA FLEXTOUCH 100 UNIT/ML ~~LOC~~ SOPN: 20.0000 [IU] | PEN_INJECTOR | Freq: Every evening | SUBCUTANEOUS | 5 refills | Status: DC

## 2020-09-30 MED ORDER — TRESIBA FLEXTOUCH 100 UNIT/ML ~~LOC~~ SOPN: 20.0000 [IU] | PEN_INJECTOR | Freq: Every evening | SUBCUTANEOUS | 3 refills | Status: DC

## 2020-09-30 NOTE — Assessment & Plan Note (Signed)
Medications: Tresiba 20 units nightly, Humalog 25 units TID, Trulicity 0.75 weekly Labs: A1c- 05/25/20 13.9  09/27/20 13.3 Patient presents today via telehealth as a follow-up from visit 8/15 to confirm what medications he is on.  Patient states that he has been taking [place] 20 units. He frequently forgets to take this at night so takes it the following day. On chart review, patient was last prescribed [name] by Pediatric endocrinology. [name] discontinued in July 2022 and reason listed was due to insurance.  [name] states that he did not have problems getting medication in April.  Lantus was ordered to replace, but patient is unaware of that prescription. Called his pharmacies ([facility]) and no Lantus has been filled at either pharmacy.  Will try to continue on Tresiba, but will consider switching to other long-acting such as Lantus or Levemir if insurance is problematic. He has also been taking Humalog 25 units, he states that he tries to take this a couple times a day.  He is also taking Trulicity and states that he takes this consistently once weekly.  Out of 7 days, [name] takes his insulin 4/7 days.  Encouraged patient to set an alarm on his phone at night to take his [place].  He states that he does not regularly eat dinner, asked him to set an alarm before his meals to take Humalog.  He has been taking it after eating.  Educated to only take Humalog if he plans to eat and before the meal. Also asked him to continue taking Trulicity.  Patient has significant issues injecting insulin due to Chronic Inflammatory Demyelinating Polyneuropathy, his grandmother injects them for him. She has difficulty with injecting Humalog due to hand pain.       Plan: -Referral to endocrinology -Continue Tresiba 20 units nightly, Humalog 25 units TID, Trulicity 0.75 weekly -Refilled Tresiba -Follow-up in one week.  Asked patient to be sure to bring in medications and dexcom at next visit.

## 2020-09-30 NOTE — Progress Notes (Signed)
  [facility] Internal Medicine Residency Telephone Encounter Continuity Care Appointment  HPI:  This telephone encounter was created for [name] on 09/30/2020 for follow-up on medication adherence for diabetes.   Past Medical History:  Past Medical History:  Diagnosis Date   CIDP (chronic inflammatory demyelinating polyneuropathy) (HCC)    Diabetes mellitus without complication (HCC)    type 1     ROS:  Denies dizziness, nausea, or tremulousness   Assessment / Plan / Recommendations:  Please see A&P under problem oriented charting for assessment of the patient's acute and chronic medical conditions.  As always, pt is advised that if symptoms worsen or new symptoms arise, they should go to an urgent care facility or to to ER for further evaluation.   Consent and Medical Decision Making:  Patient seen with Dr. Oswaldo Done This is a telephone encounter between Alphonzo Lemmings and Brailynn Breth on 09/30/2020 for follow-up on medication adherence for diabetes.. The visit was conducted with the patient located at home and Rudene Christians at [facility]. The patient's identity was confirmed using their DOB and current address. The patient has consented to being evaluated through a telephone encounter and understands the associated risks (an examination cannot be done and the patient may need to come in for an appointment) / benefits (allows the patient to remain at home, decreasing exposure to coronavirus). I personally spent 15 minutes on medical discussion.

## 2020-09-30 NOTE — Telephone Encounter (Addendum)
Information was sent through CoverMyMeds for PA for [place].  Awaiting determination within 72 hours.  Angelina Ok, RN 09/30/2020 3:12 PM. PA for [name] was denied.  Call to [facility] Reference # [id number]. Additional information was sent .  Awaiting determination.  Angelina Ok, RN 10/06/2020 10:26 AM.

## 2020-09-30 NOTE — Progress Notes (Signed)
Internal Medicine Clinic Attending  Case discussed with Dr. Masters  At the time of the visit.  We reviewed the resident's history and exam and pertinent patient test results.  I agree with the assessment, diagnosis, and plan of care documented in the resident's note.  

## 2020-10-04 ENCOUNTER — Ambulatory Visit: Payer: Medicaid Other | Admitting: Nutrition

## 2020-10-08 ENCOUNTER — Other Ambulatory Visit: Payer: Self-pay | Admitting: *Deleted

## 2020-10-08 NOTE — Patient Outreach (Signed)
Care Coordination  10/08/2020  Lucas Cunningham 07/18/2000 [id number]   Medicaid Managed Care   Unsuccessful Outreach Note  10/08/2020 Name: Lucas Cunningham MRN: [id number] DOB: 05-16-2000  Referred by: Steffanie Rainwater, MD Reason for referral : High Risk Managed Medicaid (Unsuccessful RNCM follow up outreach)   An unsuccessful telephone outreach was attempted today. The patient was referred to the case management team for assistance with care management and care coordination.   Follow Up Plan: A HIPAA compliant phone message was left for the patient providing contact information and requesting a return call.  The care management team will reach out to the patient again over the next 14 days.   [name] RN, BSN [place]  [facility]

## 2020-10-08 NOTE — Patient Instructions (Signed)
Visit Information ? ?[name]  - as a part of your Medicaid benefit, you are eligible for care management and care coordination services at no cost or copay. I was unable to reach you by phone today but would be happy to help you with your health related needs. Please feel free to call me @ [phone].  ? ?A member of the Managed Medicaid care management team will reach out to you again over the next 14 days.  ? ?Hendrixx Severin RN, BSN ?[place]  [facility] ?RN Care Coordinator ?  ?

## 2020-10-12 ENCOUNTER — Other Ambulatory Visit: Payer: Self-pay | Admitting: Internal Medicine

## 2020-10-12 DIAGNOSIS — E1065 Type 1 diabetes mellitus with hyperglycemia: Secondary | ICD-10-CM

## 2020-10-12 MED ORDER — BD PEN NEEDLE NANO U/F 32G X 4 MM MISC: 6 refills | Status: DC

## 2020-10-12 MED ORDER — TRULICITY 0.75 MG/0.5ML ~~LOC~~ SOAJ: 0.7500 mg | SUBCUTANEOUS | 6 refills | Status: DC

## 2020-10-12 MED ORDER — LANTUS SOLOSTAR 100 UNIT/ML ~~LOC~~ SOPN: 20.0000 [IU] | PEN_INJECTOR | Freq: Every day | SUBCUTANEOUS | 2 refills | Status: DC

## 2020-10-12 NOTE — Telephone Encounter (Signed)
Called in lantus instead. Family informed.

## 2020-10-12 NOTE — Telephone Encounter (Signed)
Pt P.A  for his [name] for the following reasons :  Message from Plan:  Denied. The drug that you asked for is not covered. We reviewed your case. You have not tried these covered drugs: <>. Note: Some covered drug(s) may have limits on the quantity you can get. You can get this information on the list of covered drugs (Preferred Drug List) for details.

## 2020-10-12 NOTE — Progress Notes (Signed)
Patient's prior Berkley Harvey was denied for [place]. Prescribed Lantus 20 units nights as an alternative. Patient' s Lucas Cunningham was informed of the change and counseled to start this once he has run out of his [place]. She admits to understanding.

## 2020-10-13 ENCOUNTER — Other Ambulatory Visit: Payer: Medicaid Other

## 2020-10-14 NOTE — Progress Notes (Signed)
Internal Medicine Clinic Attending  I saw and evaluated the patient.  I personally confirmed the key portions of the history and exam documented by Dr. Masters and I reviewed pertinent patient test results.  The assessment, diagnosis, and plan were formulated together and I agree with the documentation in the resident's note.  

## 2020-10-22 ENCOUNTER — Other Ambulatory Visit: Payer: Self-pay | Admitting: *Deleted

## 2020-10-22 NOTE — Patient Instructions (Signed)
Visit Information ? ?[name]  - as a part of your Medicaid benefit, you are eligible for care management and care coordination services at no cost or copay. I was unable to reach you by phone today but would be happy to help you with your health related needs. Please feel free to call me @ [phone].  ? ?A member of the Managed Medicaid care management team will reach out to you again over the next 14 days.  ? ?Maymie Brunke RN, BSN ?[place]  [facility] ?RN Care Coordinator ?  ?

## 2020-10-22 NOTE — Patient Outreach (Signed)
Care Coordination  10/22/2020  Cauy Melody Jan 13, 2001 [id number]   Medicaid Managed Care   Unsuccessful Outreach Note  10/22/2020 Name: Lucas Cunningham MRN: [id number] DOB: [birth year]  Referred by: Steffanie Rainwater, MD Reason for referral : High Risk Managed Medicaid (Unsuccessful RNCM follow up outreach)   A second unsuccessful telephone outreach was attempted today. The patient was referred to the case management team for assistance with care management and care coordination.   Follow Up Plan: A HIPAA compliant phone message was left for the patient providing contact information and requesting a return call.   [name] RN, BSN [place]  [facility]

## 2020-11-05 ENCOUNTER — Other Ambulatory Visit: Payer: Self-pay | Admitting: *Deleted

## 2020-11-05 ENCOUNTER — Other Ambulatory Visit: Payer: Self-pay

## 2020-11-05 NOTE — Patient Instructions (Signed)
Visit Information  Lucas Cunningham was given information about Medicaid Managed Care team care coordination services as a part of their [facility] Medicaid benefit. Lucas Cunningham verbally consented to engagement with the [facility] Managed Care team.   If you are experiencing a medical emergency, please call 911 or report to your local emergency department or urgent care.   If you have a non-emergency medical problem during routine business hours, please contact your provider's office and ask to speak with a nurse.   For questions related to your [facility] health plan, please call: [phone] or go here:[email or web address]  If you would like to schedule transportation through your [facility] plan, please call the following number at least 2 days in advance of your appointment: [phone].  Call the [facility] Crisis Line at [phone], at any time, 24 hours a day, 7 days a week. If you are in danger or need immediate medical attention call 911.  If you would like help to quit smoking, call 1-800-QUIT-NOW ((641)189-2807) OR Espaol: 1-855-Djelo-Ya (6-144-315-4008) o para ms informacin haga clic aqu or Text READY to 676-195 to register via text  Lucas Cunningham - following are the goals we discussed in your visit today:   Goals Addressed             This Visit's Progress    Eat Healthy       Timeframe:  Long-Range Goal Priority:  High Start Date:  06/29/20                             Expected End Date:  11/19/20                  Follow Up Date 11/19/20   - adhere to a diabetic diet, eat three small meals a day with protein rich snacks - change to whole grain breads, cereal, pasta - drink 6 to 8 glasses of water each day - limit fast food meals to no more than 1 per week - set a realistic goal - switch to low-fat or skim milk    Why is this important?   When you are ready to manage your nutrition or weight, having a plan and setting goals will help.   Taking small steps to change how you eat and exercise is a good place to start.         Make and Keep All Appointments       Timeframe:  Long-Range Goal Priority:  High Start Date:   06/29/20                        Expected End Date:  11/19/20                   Follow Up Date 11/19/20   - call [facility] [phone] to schedule an appointment(referred by PCP) - Attend PCP appointment on 11/15/20 and reschedule Nutrition and Diabetes [phone] appointment ([address], [address]) - arrange a ride through an agency 1 week before appointment, call [phone] for medical transportation provided by [facility] -contact [employer] for free phone benefits and over-the-counter medication benefits [phone], request Dental providers in [place] accepting your insurance - call to cancel appointments if needed - keep a calendar with prescription refill dates - keep a calendar with appointment dates    Why is this important?   Part of staying healthy is seeing the doctor for follow-up care.  If you forget your appointments, there are some things you can do to stay on track.         Protect My Health       Timeframe:  Long-Range Goal Priority:  High Start Date:  06/11/20                           Expected End Date: 11/19/20                      Follow Up Date  11/19/20   - schedule and keep appointment for annual check-up  - work with Lucas Cunningham, BSW for assistance with arranging [facility] for healthcare management   Why is this important?   Screening tests can find diseases early when they are easier to treat.  Your doctor or nurse will talk with you about which tests are important for you.  Getting shots for common diseases like the flu and shingles will help prevent them.             Please see education materials related to diabetes and nutrition provided as print materials.   The patient verbalized understanding of instructions provided today and agreed to  receive a mailed copy of patient instruction and/or educational materials.  Telephone follow up appointment with Managed Medicaid care management team member scheduled for:11/19/20 @ 3:30pm  Lucas Emms RN, BSN [place]  [facility] RN Care Coordinator   Following is a copy of your plan of care:  Patient Care Plan: General Plan of Care (Adult)     Problem Identified: Interdisciplinary Collaborative/Care Coordination   Priority: High  Onset Date: 06/11/2020     Long-Range Goal: Patient-Specific Goal   Start Date: 06/11/2020  Expected End Date: 09/10/2020  This Visit's Progress: On track  Priority: High  Note:   Current Barriers:  Care Management support, education, and care coordination needs related to DM Case Manager Clinical Goal(s):  patient will work with BSW to address needs related to Level of care concerns and ADL IADL limitations in patient with DM Interventions:  Collaborated with BSW to initiate plan of care to address needs related to Level of care concerns and ADL IADL limitations in patient with DM Self Care Activities:  Patient will attend all scheduled provider appointments Patient will work with BSW to address care coordination needs and will continue to work with the clinical team to address health care and disease management related needs.   Patient Goals: - schedule and keep appointment for annual check-up  - work with Lucas Cunningham, BSW for assistance with arranging [facility] for healthcare management Follow up plan:  The Managed Medicaid care management team will reach out to the patient again over the next 14 days.      Problem Identified: Health Promotion or Disease Self-Management (General Plan of Care)      Long-Range Goal: Self-Management Plan Developed   Start Date: 06/29/2020  Expected End Date: 10/20/2020  Recent Progress: On track  Priority: High  Note:   Current Barriers:  Ineffective Self Health Maintenance-RNCM spoke with Lucas Cunningham,  patient's grandmother, today. She was not at home and has the only phone. She reports not hearing anything regarding the DME ordered by PCP on 5/20. It looks like a second order was placed for walker on 6/13. She is unsure if Lucas Cunningham is using the CGM and requested a call back in 2 hours. RNCM returned call to speak with Lucas Cunningham. 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 He is currently checking BS with glucometer, readings 250-270 preprandial and greater than 300 postprandial. He reports taking his insulin as prescribed. Reports that he has cut back on fast food and is eating more fruits and vegetables. Today Lucas Cunningham reports using Dexcom last, one week ago. He is not using it due to needing "a part that goes to it". RNCM discussed the different parts and explained that he has refills for sensors. Lucas Cunningham Diabetic Education appointment was cancelled due to insurance coverage. Grandmother misunderstood and thought that he needed to find a new PCP. Patient has received a new walker and shower chair.-Today Lucas Cunningham reports having all pieces to his Dexcom and he is using it. Blood sugars 200-300. He is unaware that he missed Diabetic Education appointment. He reports having an eye visit on 10/15 and follow up PCP 10/3 and having transportation to both. 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  Knowledge Deficits related to short term plan for care coordination needs and long term plans for chronic disease management needs Nurse Case Manager Clinical Goal(s):  patient will work with care management team to address care coordination and chronic disease management needs related to Disease Management   Interventions:  Evaluation of current treatment plan related to [name] and CIDP and patient's adherence to plan as established by provider. Advised to attend Internal Medicine [phone] for follow up 11/15/20 Advised to call and  reschedule missed appointment with Nutrition and Dietician [phone] ([address], [address] [phone]) Advised patient to call [facility] [phone] to schedule an appointment(referred by PCP) Reviewed medications, discussed importance in taking as directed, specifically Humalog-take before meals Discussed plans with patient for ongoing care management follow up and provided patient with direct contact information for care management team RNCM will follow closely Discussed the importance of taking his medications consistently at prescribed times Discussed benefits provided by [facility] and encouraged patient to call [phone] and inquire about a free phone and [employer] Rewards Provided education on diabetic diet Self Care Activities:  Patient will self administer medications as prescribed Patient will attend all scheduled provider appointments Patient will call pharmacy for medication refills Patient will call provider office for new concerns or questions Patient Goals:  - adhere to a diabetic diet, eat three small meals a day with protein rich snacks - change to whole grain breads, cereal, pasta - drink 6 to 8 glasses of water each day - limit fast food meals to no more than 1 per week - set a realistic goal - switch to low-fat or skim milk  - call [facility] [phone] to schedule an appointment(referred by PCP) - Attend PCP appointment on 11/15/20 and reschedule Nutrition and Diabetes [phone] appointment ([address]) - arrange a ride through an agency 1 week before appointment, call [phone] for medical transportation provided by [facility] -contact [employer] for free phone benefits and over-the-counter medication benefits [phone], request Dental providers in [place] accepting your insurance - call to cancel appointments if needed - keep a calendar with prescription refill dates - keep a calendar with appointment dates   Follow Up Plan: Telephone follow up appointment with care management team member scheduled for:11/19/20 @ 3:30pm     Patient Care Plan: General Social Work (Adult)     Problem Identified: HH Services   Onset Date: 06/08/2020  Note:   Lucas Cunningham is a 20 y.o. year old male  who sees Lucas Cunningham, Lucas Corpus, NP for primary care. The  [facility] Managed Care team was consulted for assistance with Limited access to food ADL IADL limitations. Mr. Chiappetta was given information about Care Management services, agreed to services, and verbal consent for services was obtained.  Interventions:  Patient interviewed and appropriate assessments performed Collaborated with clinical team regarding patient needs  SDOH (Social Determinants of Health) assessments performed: Yes     Collaborated with [facility] for [facility] referral BSW contacted patient and spoke with grandmother. She stated that due to patients neuropathy it makes it difficult for him to do a lot of things on his own. Grandmother states he does need assistance with bathing, with the braces on his legs it makes it difficult for him. Patient keeps a jug in his room for toileting. Grandmother states she does the best she can, but assistance is needed. BSW contacted [facility] and spoke with Lucas Cunningham, a referral for [facility] was submitted and [employer] will reach out to patient to complete an assessment.  Grandmother stated she has applied for disability for patient but has not received an answer yet, grandmother is the only income in the household and food is getting hard to keep in the home. Grandmother states she has completed an application for foodstamps for patient and is waiting for a response. BSW provided grandmother with food pantry resources.  Update 06/28/20: BSW contacted patient and spoke with patient and grandmother. They both stated that they have not heard anything from [facility] about getting an assessment completed. BSW will contact [employer] to check  on referral. Patient stated that they have started receiving foodstamps and have not heard anything about disabliity. Update 07/16/20: BSW spoke with patients grandmother today. She stated they still have not heard anything from disabliity nor [employer]. BSW contact [employer] and did not get a rep. BSW will continue to contact for PCS referral. Patient's mother wanted information on being paid to take care of patient.   Plan:  Over the next 30-60 days, patient will work with BSW to address needs related to ADL IADL limitations Social Worker will follow up in 14 days.   Lucas Cunningham, BSW, [place] [facility]  [place]  High Risk Managed Medicaid Team          Patient Care Plan: Medication Management     Problem Identified: Health Promotion or Disease Self-Management (General Plan of Care)      Goal: Medication Management   Note:   Current Barriers:  Unable to independently monitor therapeutic efficacy Unable to self administer medications as prescribed Does not adhere to prescribed medication regimen Does not maintain contact with provider office Does not contact provider office for questions/concerns   Pharmacist Clinical Goal(s):  Over the next 30 days, patient will achieve adherence to monitoring guidelines and medication adherence to achieve therapeutic efficacy contact provider office for questions/concerns as evidenced notation of same in electronic health record through collaboration with PharmD and provider.    Interventions: Inter-disciplinary care team collaboration (see longitudinal plan of care) Comprehensive medication review performed; medication list updated in electronic medical record    Patient Goals/Self-Care Activities Over the next 30 days, patient will:  - take medications as prescribed collaborate with provider on medication access solutions  Follow Up Plan: The care management team will reach out to the patient again over the next 30  days.

## 2020-11-05 NOTE — Patient Outreach (Signed)
Medicaid Managed Care   Nurse Care Manager Note  11/05/2020 Name:  Lucas Cunningham MRN:  [id number] DOB:  01/16/2001  Lucas Cunningham is an 20 y.o. year old male who is a primary patient of Amponsah, Charisse March, MD.  The [facility] Managed Care Coordination team was consulted for assistance with:    [name]  Mr. [name] was given information about Medicaid Managed Care Coordination team services today. [name] Patient agreed to services and verbal consent obtained.  Engaged with patient by telephone for follow up visit in response to provider referral for case management and/or care coordination services.   Assessments/Interventions:  Review of past medical history, allergies, medications, health status, including review of consultants reports, laboratory and other test data, was performed as part of comprehensive evaluation and provision of chronic care management services.  SDOH (Social Determinants of Health) assessments and interventions performed: SDOH Interventions    Flowsheet Row Most Recent Value  SDOH Interventions   Food Insecurity Interventions Intervention Not Indicated  [Currently receiving food stamps]  Transportation Interventions Intervention Not Indicated  [Grandmother's car is fixed now]       Care Plan  No Known Allergies  Medications Reviewed Today     Reviewed by Melissa Montane, RN (Registered Nurse) on 11/05/20 at 1455  Med List Status: <None>   Medication Order Taking? Sig Documenting Provider Last Dose Status Informant  Accu-Chek FastClix Lancets MISC [id number] Yes 1 Device by Does not apply route as directed. Use with Fastclix lancet device to check blood sugar up to 8x daily. (diagnosis code E10.65) Sherrlyn Hock, MD Taking Active   acetone, urine, test strip [id number] No 1 strip by Does not apply route as needed for high blood sugar. Up to 8x daily if necessary  Patient not taking: No sig reported   Sherrlyn Hock, MD Not Taking Active    atorvastatin (LIPITOR) 10 MG tablet [id number] Yes Take 1 tablet (10 mg total) by mouth daily. Masters, Scientist, research (physical sciences), DO Taking Active   Continuous Blood Gluc Receiver (DEXCOM G6 RECEIVER) DEVI [id number] Yes 1 Device by Does not apply route as directed. Sherrlyn Hock, MD Taking Active   Continuous Blood Gluc Receiver [facility] G6 RECEIVER) [name] [id number] Yes USE 1 DEVICE AS DIRECTED. Sherrlyn Hock, MD Taking Active   Continuous Blood Gluc Sensor (DEXCOM G6 SENSOR) MISC [id number] Yes INJECT 1 APPLICATOR INTO THE SKIN AS DIRECTED. (CHANGE SENSOR EVERY 10 DAYS) Sherrlyn Hock, MD Taking Active   Continuous Blood Gluc Sensor (DEXCOM G6 SENSOR) MISC [id number] Yes Inject 1 applicator into the skin as directed. (change sensor every 10 days) Al Corpus, MD Taking Active   Continuous Blood Gluc Transmit (DEXCOM G6 TRANSMITTER) MISC [id number] Yes INJECT 1 DEVICE INTO THE SKIN AS DIRECTED. (RE-USE UP TO 8X WITH EACH NEW SENSOR) Sherrlyn Hock, MD Taking Active   Continuous Blood Gluc Transmit (DEXCOM G6 TRANSMITTER) MISC [id number] Yes Inject 1 Device into the skin as directed. (re-use up to 8x with each new sensor) Al Corpus, MD Taking Active   Dulaglutide (TRULICITY) 0.92 ZR/0.0TM SOPN [id number] Yes Inject 0.75 mg into the skin once a week. Marianna Payment, MD Taking Active   gabapentin (NEURONTIN) 300 MG capsule [id number] No Take 1 capsule (300 mg total) by mouth 2 (two) times daily.  Patient not taking: Reported on 11/05/2020   Sanjuan Dame, MD Not Taking Active   Glucose Blood (BLOOD GLUCOSE TEST STRIPS) STRP [id number] Yes 1 Device by Other route as directed. To check blood  sugar up to 8x per day Sherrlyn Hock, MD Taking Active   insulin glargine (LANTUS SOLOSTAR) 100 UNIT/ML Solostar Pen [id number] Yes Inject 20 Units into the skin at bedtime. Please start using this medication once you have run out of [place]. Marianna Payment, MD Taking Active   insulin lispro (HUMALOG KWIKPEN) 200  UNIT/ML KwikPen [id number] Yes Inject up to 100 units daily per provider instructions Al Corpus, MD Taking Active   Insulin Pen Needle (BD PEN NEEDLE NANO U/F) 32G X 4 MM MISC [id number] Yes Inject up to 8 times per day. Marianna Payment, MD Taking Active   ketoconazole (NIZORAL) 2 % cream [id number] No Apply to fet daily.  Patient not taking: No sig reported   Sherrlyn Hock, MD Not Taking Active   Lancets Misc. (ACCU-CHEK FASTCLIX LANCET) KIT [id number]  1 Device by Does not apply route as directed. Use with lancets to check blood sugar up to 8x daily.  (diagnosis code E10.65)  Patient taking differently: 1 Device by Does not apply route as directed. Use with lancets to check blood sugar up to 8x daily.  (diagnosis code E10.65)   Sherrlyn Hock, MD  Active   ondansetron (ZOFRAN ODT) 8 MG disintegrating tablet [id number] No Take 1 tablet (8 mg total) by mouth every 8 (eight) hours as needed for nausea or vomiting.  Patient not taking: No sig reported   Al Corpus, MD Not Taking Active   PRESCRIPTION MEDICATION [id number] Yes Inject into the vein. IVIG every 4 weeks. Order for new start sent to [facility] on 04/08/2020. Melvenia Beam, MD Taking Active             Patient Active Problem List   Diagnosis Date Noted   CIDP (chronic inflammatory demyelinating polyneuropathy) ([place]) 05/25/2020   Noncompliance with medication treatment due to difficulty with dosing 05/25/2020   Mixed hyperlipidemia 05/25/2020   Depression 05/25/2020   Diabetic polyneuropathy associated with type 1 diabetes mellitus ([place]) 02/26/2019   Postprandial bloating 02/25/2019   Sensory problems with limbs 02/25/2019   Uncontrolled type 1 diabetes mellitus with hyperglycemia ([place]) 05/24/2017   Morbid obesity ([place]) 05/24/2017   Acanthosis 05/24/2017    Conditions to be addressed/monitored per PCP order:   [name]  Care Plan : General Plan of Care (Adult)  Updates made by Melissa Montane, RN  since 11/05/2020 12:00 AM     Problem: Health Promotion or Disease Self-Management (General Plan of Care)      Long-Range Goal: Self-Management Plan Developed   Start Date: 06/29/2020  Expected End Date: 10/20/2020  Recent Progress: On track  Priority: High  Note:   Current Barriers:  Ineffective Self Health Maintenance-RNCM spoke with Esaw Dace, patient's grandmother, today. She was not at home and has the only phone. She reports not hearing anything regarding the DME ordered by PCP on 5/20. It looks like a second order was placed for walker on 6/13. She is unsure if [name] is using the CGM and requested a call back in 2 hours. RNCM returned call to speak with [place]. 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 He is currently checking BS with glucometer, readings 250-270 preprandial and greater than 300 postprandial. He reports taking his insulin as prescribed. Reports that he has cut back on fast food and is eating more fruits and vegetables. Today Paras reports using Dexcom last, one week ago. He is not using it due to needing "a part that goes to it". RNCM discussed the different  parts and explained that he has refills for sensors. Rube's Diabetic Education appointment was cancelled due to insurance coverage. Grandmother misunderstood and thought that he needed to find a new PCP. Patient has received a new walker and shower chair.-Today Dahmir reports having all pieces to his Dexcom and he is using it. Blood sugars 200-300. He is unaware that he missed Diabetic Education appointment. He reports having an eye visit on 10/15 and follow up PCP 10/3 and having transportation to both. 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  Knowledge Deficits related to short term plan for care coordination needs and long term plans for chronic disease management needs Nurse Case Manager Clinical Goal(s):  patient will  work with care management team to address care coordination and chronic disease management needs related to Disease Management   Interventions:  Evaluation of current treatment plan related to [name] and CIDP and patient's adherence to plan as established by provider. Advised to attend Internal Medicine [phone] for follow up 11/15/20 Advised to call and reschedule missed appointment with Nutrition and [place] ([address], [phone]) Advised patient to call [facility] [phone] to schedule an appointment(referred by PCP) Reviewed medications, discussed importance in taking as directed, specifically Humalog-take before meals Discussed plans with patient for ongoing care management follow up and provided patient with direct contact information for care management team RNCM will follow closely Discussed the importance of taking his medications consistently at prescribed times Discussed benefits provided by [facility] and encouraged patient to call [phone] and inquire about a free phone and Healthy Rewards Provided education on diabetic diet Self Care Activities:  Patient will self administer medications as prescribed Patient will attend all scheduled provider appointments Patient will call pharmacy for medication refills Patient will call provider office for new concerns or questions Patient Goals:  - adhere to a diabetic diet, eat three small meals a day with protein rich snacks - change to whole grain breads, cereal, pasta - drink 6 to 8 glasses of water each day - limit fast food meals to no more than 1 per week - set a realistic goal - switch to low-fat or skim milk  - call [facility] [phone] to schedule an appointment(referred by PCP) - Attend PCP appointment on 11/15/20 and reschedule Nutrition and Diabetes [phone] appointment ([address], [address]) - arrange a ride through an agency 1 week before appointment, call  [phone] for medical transportation provided by [facility] -contact [place] for free phone benefits and over-the-counter medication benefits [phone], request Dental providers in [place] accepting your insurance - call to cancel appointments if needed - keep a calendar with prescription refill dates - keep a calendar with appointment dates  Follow Up Plan: Telephone follow up appointment with care management team member scheduled for:11/19/20 @ 3:30pm      Follow Up:  Patient agrees to Care Plan and Follow-up.  Plan: The Managed Medicaid care management team will reach out to the patient again over the next 14 days.  Date/time of next scheduled RN care management/care coordination outreach:  11/19/20 @ 3:30pm  Lurena Joiner RN, [place] RN Care Coordinator

## 2020-11-06 ENCOUNTER — Other Ambulatory Visit: Payer: Self-pay | Admitting: Student

## 2020-11-08 NOTE — Telephone Encounter (Signed)
Next appt scheduled 11/15/20 with PCP. 

## 2020-11-15 ENCOUNTER — Encounter: Payer: Medicaid Other | Admitting: Student

## 2020-11-19 ENCOUNTER — Other Ambulatory Visit: Payer: Self-pay

## 2020-11-19 ENCOUNTER — Other Ambulatory Visit: Payer: Self-pay | Admitting: *Deleted

## 2020-11-19 DIAGNOSIS — Z9189 Other specified personal risk factors, not elsewhere classified: Secondary | ICD-10-CM

## 2020-11-19 NOTE — Patient Outreach (Signed)
Medicaid Managed Care   Nurse Care Manager Note  11/19/2020 Name:  Lucas Cunningham MRN:  [id number] DOB:  [birth year]  Lucas Cunningham is an 20 y.o. year old male who is a primary patient of Amponsah, Charisse March, MD.  The [facility] Managed Care Coordination team was consulted for assistance with:    [name]  Lucas Cunningham was given information about Medicaid Managed Care Coordination team services today. Lucas Cunningham to services and verbal consent obtained.  Engaged with patient by telephone for follow up visit in response to provider referral for case management and/or care coordination services.   Assessments/Interventions:  Review of past medical history, allergies, medications, health status, including review of consultants reports, laboratory and other test data, was performed as part of comprehensive evaluation and provision of chronic care management services.  SDOH (Social Determinants of Health) assessments and interventions performed: SDOH Interventions    Flowsheet Row Most Recent Value  SDOH Interventions   Social Connections Interventions Other (Comment)  [Referral to Care Guide for social connections]       Care Plan  No Known Allergies  Medications Reviewed Today     Reviewed by Melissa Montane, RN (Registered Nurse) on 11/19/20 at 1445  Med List Status: <None>   Medication Order Taking? Sig Documenting Provider Last Dose Status Informant  Accu-Chek FastClix Lancets MISC [id number] No 1 Device by Does not apply route as directed. Use with Fastclix lancet device to check blood sugar up to 8x daily. (diagnosis code E10.65) Sherrlyn Hock, MD Taking Active   acetone, urine, test strip [id number] No 1 strip by Does not apply route as needed for high blood sugar. Up to 8x daily if necessary  Patient not taking: No sig reported   Sherrlyn Hock, MD Not Taking Active   atorvastatin (LIPITOR) 10 MG tablet [id number] No Take 1 tablet (10 mg  total) by mouth daily. Masters, Scientist, research (physical sciences), DO Taking Active   Continuous Blood Gluc Receiver (DEXCOM G6 RECEIVER) DEVI [id number] No 1 Device by Does not apply route as directed. Sherrlyn Hock, MD Taking Active   Continuous Blood Gluc Receiver [facility] G6 RECEIVER) DEVI [id number] No USE 1 DEVICE AS DIRECTED. Sherrlyn Hock, MD Taking Active   Continuous Blood Gluc Sensor (DEXCOM G6 SENSOR) MISC [id number] No INJECT 1 APPLICATOR INTO THE SKIN AS DIRECTED. (CHANGE SENSOR EVERY 10 DAYS) Sherrlyn Hock, MD Taking Active   Continuous Blood Gluc Sensor (DEXCOM G6 SENSOR) MISC [id number] No Inject 1 applicator into the skin as directed. (change sensor every 10 days) Al Corpus, MD Taking Active   Continuous Blood Gluc Transmit (DEXCOM G6 TRANSMITTER) MISC [id number] No INJECT 1 DEVICE INTO THE SKIN AS DIRECTED. (RE-USE UP TO 8X WITH EACH NEW SENSOR) Sherrlyn Hock, MD Taking Active   Continuous Blood Gluc Transmit (DEXCOM G6 TRANSMITTER) MISC [id number] No Inject 1 Device into the skin as directed. (re-use up to 8x with each new sensor) Al Corpus, MD Taking Active   Dulaglutide (TRULICITY) 4.50 TU/8.8KC SOPN [id number] No Inject 0.75 mg into the skin once a week. Marianna Payment, MD Taking Active   gabapentin (NEURONTIN) 300 MG capsule [id number]  Take 1 capsule (300 mg total) by mouth 2 (two) times daily. Lacinda Axon, MD  Active   Glucose Blood (BLOOD GLUCOSE TEST STRIPS) STRP [id number] No 1 Device by Other route as directed. To check blood sugar up to 8x per day Sherrlyn Hock, MD Taking Active   insulin glargine (  LANTUS SOLOSTAR) 100 UNIT/ML Solostar Pen [id number] No Inject 20 Units into the skin at bedtime. Please start using this medication once you have run out of [place]. Marianna Payment, MD Taking Active   insulin lispro (HUMALOG KWIKPEN) 200 UNIT/ML KwikPen [id number] No Inject up to 100 units daily per provider instructions Al Corpus, MD Taking Active   Insulin Pen Needle  (BD PEN NEEDLE NANO U/F) 32G X 4 MM MISC [id number] No Inject up to 8 times per day. Marianna Payment, MD Taking Active   ketoconazole (NIZORAL) 2 % cream [id number] No Apply to fet daily.  Patient not taking: No sig reported   Sherrlyn Hock, MD Not Taking Active   Lancets Misc. (ACCU-CHEK FASTCLIX LANCET) KIT [id number] No 1 Device by Does not apply route as directed. Use with lancets to check blood sugar up to 8x daily.  (diagnosis code E10.65)  Patient taking differently: 1 Device by Does not apply route as directed. Use with lancets to check blood sugar up to 8x daily.  (diagnosis code E10.65)   Sherrlyn Hock, MD Unknown Active   ondansetron (ZOFRAN ODT) 8 MG disintegrating tablet [id number] No Take 1 tablet (8 mg total) by mouth every 8 (eight) hours as needed for nausea or vomiting.  Patient not taking: No sig reported   Al Corpus, MD Not Taking Active   PRESCRIPTION MEDICATION [id number] No Inject into the vein. IVIG every 4 weeks. Order for new start sent to [facility] on 04/08/2020. 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 ([place]) 02/26/2019   Postprandial bloating 02/25/2019   Sensory problems with limbs 02/25/2019   Uncontrolled type 1 diabetes mellitus with hyperglycemia ([place]) 05/24/2017   Morbid obesity ([place]) 05/24/2017   Acanthosis 05/24/2017    Conditions to be addressed/monitored per PCP order:   [name]  Care Plan : General Plan of Care (Adult)  Updates made by Melissa Montane, RN since 11/19/2020 12:00 AM     Problem: Health Promotion or Disease Self-Management (General Plan of Care)      Long-Range Goal:  Self-Management Plan Developed   Start Date: 06/29/2020  Expected End Date: 10/20/2020  Recent Progress: On track  Priority: High  Note:   Current Barriers:  Ineffective Self Health Maintenance-RNCM spoke with Lucas Cunningham, patient's grandmother, today. She was not at home and has the only phone. She reports not hearing anything regarding the DME ordered by PCP on 5/20. It looks like a second order was placed for walker on 6/13. She is unsure if Lucas Cunningham is using the CGM and requested a call back in 2 hours. RNCM returned call to speak with Lucas Cunningham. 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 He is currently checking BS with glucometer, readings 250-270 preprandial and greater than 300 postprandial. He reports taking his insulin as prescribed. Reports that he has cut back on fast food and is eating more fruits and vegetables. Today Lucas Cunningham reports using Dexcom last, one week ago. He is not using it due to needing "a part that goes to it". RNCM discussed the different parts and explained that he has refills for sensors. Lucas Cunningham Diabetic Education appointment was cancelled due  to insurance coverage. Grandmother misunderstood and thought that he needed to find a new PCP. Patient has received a new walker and shower chair.-Today Lucas Cunningham reports having all pieces to his Dexcom and he is using it. Blood sugars 200-300. He is unaware that he missed Diabetic Education appointment. He reports having an eye visit on 10/15 and follow up PCP 10/3 and having transportation to both.-Update-RNCM spoke with Lucas Cunningham today. She was unaware of needed appointments with Endocrinology and PT, she will call to get these appointments scheduled. DPR unsure if Lucas Cunningham is using Dexcom, but will check when she returns to home. She is concerned that Lucas Cunningham does not leave the house and is interested in social connections. He has an upcoming eye exam. Unable to independently manage [name] and CIDP Currently UNABLE TO independently self manage  needs related to chronic health conditions.  Knowledge Deficits related to short term plan for care coordination needs and long term plans for chronic disease management needs Nurse Case Manager Clinical Goal(s):  patient will work with care management team to address care coordination and chronic disease management needs related to Disease Management   Interventions:  Evaluation of current treatment plan related to [name] and CIDP and patient's adherence to plan as established by provider. Advised to attend Internal Medicine [phone] for follow up 11/22/20 Advised to call and reschedule missed appointment with Nutrition and [place] ([address], [address]) Advised patient to call [facility] [phone] to schedule an appointment(referred by PCP) Advised patient to call to schedule PT [phone] at [facility], [address]referral place by PCP Discussed plans with patient for ongoing care management follow up and provided patient with direct contact information for care management team RNCM will follow closely Discussed the importance of taking his medications consistently at prescribed times Discussed benefits provided by [facility] and encouraged patient to call [phone] and inquire about a free phone and Healthy Rewards Provided education on diabetic diet-discussed the importance of compliance Referral to Care Guide for social connections Self Care Activities:  Patient will self administer medications as prescribed Patient will attend all scheduled provider appointments Patient will call pharmacy for medication refills Patient will call provider office for new concerns or questions Patient Goals:  - adhere to a diabetic diet, eat three small meals a day with protein rich snacks - change to whole grain breads, cereal, pasta - drink 6 to 8 glasses of water each day - limit fast food meals to no more than 1 per week - set a  realistic goal - switch to low-fat or skim milk  - call [facility] [phone], [place], [address], to schedule an appointment(referred by PCP) - Attend PCP appointment on 11/22/20 and reschedule Nutrition and Diabetes [phone] appointment ([address], [address]) - call to schedule PT [phone] at [facility], [address] - arrange a ride through an agency 1 week before appointment, call [phone] for medical transportation provided by [facility] -contact [facility] for free phone benefits and over-the-counter medication benefits [phone], request Dental providers in [place] accepting your insurance - call to cancel appointments if needed - keep a calendar with prescription refill dates - keep a calendar with appointment dates  Follow Up Plan: Telephone follow up appointment with care management team member scheduled for:12/06/20 @ 2:30pm      Follow Up:  Patient agrees to Care Plan and Follow-up.  Plan: The Managed Medicaid care management team will reach out to the  patient again over the next 14 days.  Date/time of next scheduled RN care management/care coordination outreach:  12/06/20 @ 2:30pm  Lurena Joiner RN, BSN [place] RN Care Coordinator

## 2020-11-19 NOTE — Patient Instructions (Signed)
Visit Information  Lucas Cunningham was given information about Medicaid Managed Care team care coordination services as a part of their [facility] Medicaid benefit. Lucas Cunningham verbally consented to engagement with the [facility] Managed Care team.   If you are experiencing a medical emergency, please call 911 or report to your local emergency department or urgent care.   If you have a non-emergency medical problem during routine business hours, please contact your provider's office and ask to speak with a nurse.   For questions related to your [facility] health plan, please call: [phone] or go here:[email or web address]  If you would like to schedule transportation through your [facility] plan, please call the following number at least 2 days in advance of your appointment: [phone].  Call the [facility] Crisis Line at [phone], at any time, 24 hours a day, 7 days a week. If you are in danger or need immediate medical attention call 911.  If you would like help to quit smoking, call 1-800-QUIT-NOW (670-304-6605) OR Espaol: 1-855-Djelo-Ya (2-426-834-1962) o para ms informacin haga clic aqu or Text READY to 229-798 to register via text  Lucas Cunningham - following are the goals we discussed in your visit today:   Goals Addressed             This Visit's Progress    Eat Healthy       Timeframe:  Long-Range Goal Priority:  High Start Date:  06/29/20                             Expected End Date:  ongoing                  Follow Up Date 12/06/20   - adhere to a diabetic diet, eat three small meals a day with protein rich snacks - change to whole grain breads, cereal, pasta - drink 6 to 8 glasses of water each day - limit fast food meals to no more than 1 per week - set a realistic goal - switch to low-fat or skim milk    Why is this important?   When you are ready to manage your nutrition or weight, having a plan and setting goals will help.   Taking small steps to change how you eat and exercise is a good place to start.         Make and Keep All Appointments       Timeframe:  Long-Range Goal Priority:  High Start Date:   06/29/20                        Expected End Date:  12/06/20                   Follow Up Date 12/06/20   - call [facility] [phone], [address] [place], [place], to schedule an appointment(referred by PCP) - Attend PCP appointment on 11/22/20 and reschedule Nutrition and Diabetes [phone] appointment ([address], [address]) - call to schedule PT [phone] at [facility], [address] - arrange a ride through an agency 1 week before appointment, call [phone] for medical transportation provided by [facility] -contact [facility] for free phone benefits and over-the-counter medication benefits [phone], request Dental providers in [place] accepting your insurance - call to cancel appointments if needed - keep a calendar with prescription refill dates - keep a calendar with appointment dates  Why is this important?   Part of staying healthy is seeing the doctor for follow-up care.  If you forget your appointments, there are some things you can do to stay on track.         Protect My Health       Timeframe:  Long-Range Goal Priority:  High Start Date:  06/11/20                           Expected End Date: 12/06/20                      Follow Up Date  12/06/20   - schedule and keep appointment for annual check-up  - work with [name], BSW for assistance with arranging [facility] for healthcare management   Why is this important?   Screening tests can find diseases early when they are easier to treat.  Your doctor or nurse will talk with you about which tests are important for you.  Getting shots for common diseases like the flu and shingles will help prevent them.             Please see education materials related to diabetes provided as  print materials.   The patient verbalized understanding of instructions provided today and agreed to receive a mailed copy of patient instruction and/or educational materials.  Telephone follow up appointment with Managed Medicaid care management team member scheduled for:12/06/20 @ 2:30pm  Lucas Emms RN, BSN   [facility] RN Care Coordinator   Following is a copy of your plan of care:  Patient Care Plan: General Plan of Care (Adult)     Problem Identified: Interdisciplinary Collaborative/Care Coordination   Priority: High  Onset Date: 06/11/2020     Long-Range Goal: Patient-Specific Goal   Start Date: 06/11/2020  Expected End Date: 09/10/2020  This Visit's Progress: On track  Priority: High  Note:   Current Barriers:  Care Management support, education, and care coordination needs related to DM Case Manager Clinical Goal(s):  patient will work with BSW to address needs related to Level of care concerns and ADL IADL limitations in patient with DM Interventions:  Collaborated with BSW to initiate plan of care to address needs related to Level of care concerns and ADL IADL limitations in patient with DM Self Care Activities:  Patient will attend all scheduled provider appointments Patient will work with BSW to address care coordination needs and will continue to work with the clinical team to address health care and disease management related needs.   Patient Goals: - schedule and keep appointment for annual check-up  - work with [name], BSW for assistance with arranging [facility] for healthcare management Follow up plan:  The Managed Medicaid care management team will reach out to the patient again over the next 14 days.      Problem Identified: Health Promotion or Disease Self-Management (General Plan of Care)      Long-Range Goal: Self-Management Plan Developed   Start Date: 06/29/2020  Expected End Date: 10/20/2020  Recent Progress: On track   Priority: High  Note:   Current Barriers:  Ineffective Self Health Maintenance-RNCM spoke with Lucas Cunningham, patient's grandmother, today. She was not at home and has the only phone. She reports not hearing anything regarding the DME ordered by PCP on 5/20. It looks like a second order was placed for walker on 6/13. She is unsure if Lucas Cunningham is using the CGM  and requested a call back in 2 hours. RNCM returned call to speak with Lucas Cunningham. 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 He is currently checking BS with glucometer, readings 250-270 preprandial and greater than 300 postprandial. He reports taking his insulin as prescribed. Reports that he has cut back on fast food and is eating more fruits and vegetables. Today Lucas Cunningham reports using Dexcom last, one week ago. He is not using it due to needing "a part that goes to it". RNCM discussed the different parts and explained that he has refills for sensors. [facility] Diabetic Education appointment was cancelled due to insurance coverage. Grandmother misunderstood and thought that he needed to find a new PCP. Patient has received a new walker and shower chair.-Today Lucas Cunningham reports having all pieces to his Dexcom and he is using it. Blood sugars 200-300. He is unaware that he missed Diabetic Education appointment. He reports having an eye visit on 10/15 and follow up PCP 10/3 and having transportation to both.-Update-RNCM spoke with DPR/grandmother today. She was unaware of needed appointments with Endocrinology and PT, she will call to get these appointments scheduled. DPR unsure if Lucas Cunningham is using Dexcom, but will check when she returns to home. She is concerned that [place] does not leave the house and is interested in social connections. He has an upcoming eye exam. 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  Knowledge Deficits related to short term plan for care coordination  needs and long term plans for chronic disease management needs Nurse Case Manager Clinical Goal(s):  patient will work with care management team to address care coordination and chronic disease management needs related to Disease Management   Interventions:  Evaluation of current treatment plan related to [name] and CIDP and patient's adherence to plan as established by provider. Advised to attend Internal Medicine [phone] for follow up 11/22/20 Advised to call and reschedule missed appointment with Nutrition and Dietician [phone] ([address], [address]) Advised patient to call [facility] [phone] to schedule an appointment(referred by PCP) Advised patient to call to schedule PT [phone] at [facility], [address]referral place by PCP Discussed plans with patient for ongoing care management follow up and provided patient with direct contact information for care management team RNCM will follow closely Discussed the importance of taking his medications consistently at prescribed times Discussed benefits provided by [facility] and encouraged patient to call [phone] and inquire about a free phone and [employer] Provided education on diabetic diet-discussed the importance of compliance Referral to Care Guide for social connections Self Care Activities:  Patient will self administer medications as prescribed Patient will attend all scheduled provider appointments Patient will call pharmacy for medication refills Patient will call provider office for new concerns or questions Patient Goals:  - adhere to a diabetic diet, eat three small meals a day with protein rich snacks - change to whole grain breads, cereal, pasta - drink 6 to 8 glasses of water each day - limit fast food meals to no more than 1 per week - set a realistic goal - switch to low-fat or skim milk  - call [facility] [phone], [address] [place],  [phone], to schedule an appointment(referred by PCP) - Attend PCP appointment on 11/22/20 and reschedule Nutrition and Diabetes [phone] appointment ([address]) - call to schedule PT [phone] at [facility], [address] - arrange  a ride through an agency 1 week before appointment, call [phone] for medical transportation provided by [facility] -contact [facility] for free phone benefits and over-the-counter medication benefits [phone], request Dental providers in [place] accepting your insurance - call to cancel appointments if needed - keep a calendar with prescription refill dates - keep a calendar with appointment dates  Follow Up Plan: Telephone follow up appointment with care management team member scheduled for:12/06/20 @ 2:30pm     Patient Care Plan: General Social Work (Adult)     Problem Identified: HH Services   Onset Date: 06/08/2020  Note:   Lucas Cunningham is a 20 y.o. year old male who sees Skinner-Kiser, Jeannette Corpus, NP for primary care. The  [facility] Managed Care team was consulted for assistance with Limited access to food ADL IADL limitations. Mr. Stave was given information about Care Management services, agreed to services, and verbal consent for services was obtained.  Interventions:  Patient interviewed and appropriate assessments performed Collaborated with clinical team regarding patient needs  SDOH (Social Determinants of Health) assessments performed: Yes     Collaborated with [facility] for [facility] referral BSW contacted patient and spoke with grandmother. She stated that due to patients neuropathy it makes it difficult for him to do a lot of things on his own. Grandmother states he does need assistance with bathing, with the braces on his legs it makes it difficult for him. Patient keeps a jug in his room for toileting. Grandmother states she does the best she can, but assistance is needed. BSW contacted  [facility] and spoke with Lucas Cunningham, a referral for [facility] was submitted and [facility] will reach out to patient to complete an assessment.  Grandmother stated she has applied for disability for patient but has not received an answer yet, grandmother is the only income in the household and food is getting hard to keep in the home. Grandmother states she has completed an application for foodstamps for patient and is waiting for a response. BSW provided grandmother with food pantry resources.  Update 06/28/20: BSW contacted patient and spoke with patient and grandmother. They both stated that they have not heard anything from [facility] about getting an assessment completed. BSW will contact [employer] to check on referral. Patient stated that they have started receiving foodstamps and have not heard anything about disabliity. Update 07/16/20: BSW spoke with patients grandmother today. She stated they still have not heard anything from disabliity nor [employer]. BSW contact [employer] and did not get a rep. BSW will continue to contact for PCS referral. Patient's mother wanted information on being paid to take care of patient.   Plan:  Over the next 30-60 days, patient will work with BSW to address needs related to ADL IADL limitations Social Worker will follow up in 14 days.   Lucas Cunningham, BSW, MHA [facility]  [place]  High Risk Managed Medicaid Team          Patient Care Plan: Medication Management     Problem Identified: Health Promotion or Disease Self-Management (General Plan of Care)      Goal: Medication Management   Note:   Current Barriers:  Unable to independently monitor therapeutic efficacy Unable to self administer medications as prescribed Does not adhere to prescribed medication regimen Does not maintain contact with provider office Does not contact provider office for questions/concerns   Pharmacist Clinical Goal(s):  Over the next 30 days, patient will achieve  adherence to monitoring guidelines and medication adherence to achieve therapeutic efficacy  contact provider office for questions/concerns as evidenced notation of same in electronic health record through collaboration with PharmD and provider.    Interventions: Inter-disciplinary care team collaboration (see longitudinal plan of care) Comprehensive medication review performed; medication list updated in electronic medical record    Patient Goals/Self-Care Activities Over the next 30 days, patient will:  - take medications as prescribed collaborate with provider on medication access solutions  Follow Up Plan: The care management team will reach out to the patient again over the next 30 days.

## 2020-11-22 ENCOUNTER — Ambulatory Visit (INDEPENDENT_AMBULATORY_CARE_PROVIDER_SITE_OTHER): Payer: Medicaid Other | Admitting: Dietician

## 2020-11-22 ENCOUNTER — Encounter: Payer: Self-pay | Admitting: Student

## 2020-11-22 ENCOUNTER — Ambulatory Visit (INDEPENDENT_AMBULATORY_CARE_PROVIDER_SITE_OTHER): Payer: Medicaid Other | Admitting: Student

## 2020-11-22 ENCOUNTER — Other Ambulatory Visit: Payer: Self-pay

## 2020-11-22 VITALS — BP 137/84 | HR 89 | Temp 98.0°F | Ht 72.0 in | Wt 288.5 lb

## 2020-11-22 DIAGNOSIS — R809 Proteinuria, unspecified: Secondary | ICD-10-CM

## 2020-11-22 DIAGNOSIS — E1065 Type 1 diabetes mellitus with hyperglycemia: Secondary | ICD-10-CM

## 2020-11-22 DIAGNOSIS — E1042 Type 1 diabetes mellitus with diabetic polyneuropathy: Secondary | ICD-10-CM | POA: Diagnosis not present

## 2020-11-22 DIAGNOSIS — G6181 Chronic inflammatory demyelinating polyneuritis: Secondary | ICD-10-CM

## 2020-11-22 MED ORDER — LANTUS SOLOSTAR 100 UNIT/ML ~~LOC~~ SOPN: 25.0000 [IU] | PEN_INJECTOR | Freq: Every day | SUBCUTANEOUS | 2 refills | Status: DC

## 2020-11-22 NOTE — Assessment & Plan Note (Signed)
Stable. Continue gabapentin 300 mg twice daily.

## 2020-11-22 NOTE — Assessment & Plan Note (Signed)
BMI of 39.1.  Patient counseled on dietary changes to help with weight loss. -- Referred to diabetes educator for diabetes management and nutrition.

## 2020-11-22 NOTE — Progress Notes (Signed)
Diabetes Self-Management Education  Visit Type: First/Initial (full assessment not done today as time was limited and patient and phyisican interested in CGM)  Appt. Start Time: 1415 Appt. End Time: 1445  11/22/2020  [name], identified by name and date of birth, is a 20 y.o. male with a diagnosis of Diabetes: Type 1 (all immunological assays are negative and cpeptide was within normal limits in 2020).   ASSESSMENT  Estimated body mass index is 39.13 kg/m as calculated from the following:   Height as of an earlier encounter on 11/22/20: 6' (1.829 m).   Weight as of an earlier encounter on 11/22/20: 288 lb 8 oz (130.9 kg).  Wt Readings from Last 10 Encounters:  11/22/20 288 lb 8 oz (130.9 kg)  09/27/20 283 lb 11.2 oz (128.7 kg) (>99 %, Z= 2.87)*  07/26/20 284 lb 3.2 oz (128.9 kg) (>99 %, Z= 2.88)*  06/24/20 280 lb 6.4 oz (127.2 kg) (>99 %, Z= 2.84)*  06/15/20 268 lb 3.2 oz (121.7 kg) (>99 %, Z= 2.68)*  05/25/20 270 lb 3.2 oz (122.6 kg) (>99 %, Z= 2.70)*  02/19/20 275 lb 6.4 oz (124.9 kg) (>99 %, Z= 2.77)*  02/18/20 284 lb (128.8 kg) (>99 %, Z= 2.88)*  02/05/20 277 lb 3.2 oz (125.7 kg) (>99 %, Z= 2.79)*  01/26/20 275 lb 6.4 oz (124.9 kg) (>99 %, Z= 2.77)*   * Growth percentiles are based on CDC (Boys, 2-20 Years) data.   Lab Results  Component Value Date   HGBA1C 13.3 (A) 09/27/2020   HGBA1C 13.9 (A) 05/25/2020   HGBA1C >14 01/26/2020   HGBA1C 14.4 (H) 10/21/2019   HGBA1C 14.0 (A) 02/25/2019   HGBA1C >14 02/25/2019      BP Readings from Last 3 Encounters:  11/22/20 137/84  09/27/20 (!) 136/92  07/26/20 134/80     Diabetes Self-Management Education - 11/22/20 1800       Visit Information   Visit Type First/Initial   full assessment not done today as time was limited and patient and phyisican interested in CGM     Initial Visit   Diabetes Type Type 1   all immunological assays are negative and cpeptide was within normal limits in 2020   Are you taking your  medications as prescribed? No   taking insulin after meal and says he was told this is okay   Date Diagnosed 2019      Psychosocial Assessment   Other persons present Family Member   grandmother who helps him   Patient Concerns Monitoring   restarting his dexcom CGM   Special Needs Other (comment)   he has physical limitation with hands and grandmother helps him   Preferred Learning Style No preference indicated    Learning Readiness Ready    How often do you need to have someone help you when you Lucas instructions, pamphlets, or other written materials from your doctor or pharmacy? 3 - Sometimes    What is the last grade level you completed in school? 12      Pre-Education Assessment   Patient understands using medications safely. Needs Review    Patient understands monitoring blood glucose, interpreting and using results Needs Review      Patient Education   Previous Diabetes Education Yes (please comment)   when he was diagnosed   Medications Reviewed patients medication for diabetes, action, purpose, timing of dose and side effects.    Monitoring Other (comment)   patient says his sensor was on the back  of his arm and fell off after 2 days. Gave him a sample sensor and transmitter today, assisted grandmother know how to assist patient in putting it on and adhering. reveiwign treatment decisions based on CGM data.     Individualized Goals (developed by patient)   Monitoring  test my blood glucose as discussed   he agreed to put on CGM when he got home and had his receiver to start it and put codes in. He also agreed to call mail order and dexcom for more sensors. Over patches were provided to patient today.     Outcomes   Expected Outcomes Demonstrated interest in learning. Expect positive outcomes    Future DMSE Other (comment)   1 week   Program Status Not Completed             Individualized Plan for Diabetes Self-Management Training:   Learning Objective:  Patient will  have a greater understanding of diabetes self-management. Patient education plan is to attend individual and/or group sessions per assessed needs and concerns.   Plan:   Patient Instructions  Hi Luz,   So good to meet you today!!!  Please place sensor when you get home.   Call mail order and Dexcom tech support for more sensors ongoing.   Bring your receiver with you to your appointment next week.      Expected Outcomes:  Demonstrated interest in learning. Expect positive outcomes  Education material provided: Diabetes Resources  If problems or questions, patient to contact team via:  Phone and [employer]  Future DSME appointment: Other (comment) (1 week) Norm Parcel, RD 11/22/2020 6:22 PM.

## 2020-11-22 NOTE — Progress Notes (Signed)
   CC: Diabetes follow-up  HPI:  [name] is a 20 y.o. young male with PMH as below who presents to the clinic for evaluation of his diabetes. Please see problem based charting for evaluation, assessment and plan.  Past Medical History:  Diagnosis Date   CIDP (chronic inflammatory demyelinating polyneuropathy) (HCC)    Diabetes mellitus without complication (HCC)    type 1    Review of Systems:  Constitutional: Negative for fever, polydipsia or fatigue Eyes: Negative for visual changes Respiratory: Negative for shortness of breath Cardiac: Negative for chest pain GU: Positive for occasional polyuria Abdomen: Negative for abdominal pain, constipation or diarrhea Neuro: Negative for headache, dizziness or weakness  Physical Exam: General: Pleasant, obese young male in a wheelchair accompanied by grandmother. NAD Cardiac: RRR. No murmurs, rubs or gallops. No LE edema Respiratory: Lungs CTAB. No wheezing or crackles. Abdominal: Soft, symmetric and non tender. Normal BS. Skin: Warm, dry and intact without rashes or lesions MSK: Decreased muscle tone throughout.  Neuro: A&O x 3. Moves all extremities. Decreased sensation in the lower extremities. Decreased handgrip. Psych: Appropriate mood and affect.  Vitals:   11/22/20 1309  BP: 137/84  Pulse: 89  Temp: 98 F (36.7 C)  TempSrc: Oral  SpO2: 97%  Weight: 288 lb 8 oz (130.9 kg)  Height: 6' (1.829 m)     Assessment & Plan:   See Encounters Tab for problem based charting.  Patient discussed with Dr. West Bali, MD, MPH

## 2020-11-22 NOTE — Patient Instructions (Signed)
Hi Lucas Cunningham,   So good to meet you today!!!  Please place sensor when you get home.   Call mail order and Dexcom tech support for more sensors ongoing.   Bring your receiver with you to your appointment next week.

## 2020-11-22 NOTE — Patient Instructions (Addendum)
Thank you, [name] for allowing [name] to provide your care today. Today we discussed your diabetes. We will place another CGM and have you follow up with [name] next week.   I will call if any are abnormal. All of your labs can be accessed through "My Chart".  I have place a referrals to Diabetes Educator, Endocrinology   I have ordered the following medication/changed the following medications:  Increase lantus to 25 units at night  My Chart Access: [email or web address]  Please follow-up in 1 week  Please make sure to arrive 15 minutes prior to your next appointment. If you arrive late, you may be asked to reschedule.    We look forward to seeing you next time. Please call our clinic at [phone] if you have any questions or concerns. The best time to call is Monday-Friday from 9am-4pm, but there is someone available 24/7. If after hours or the weekend, call the main hospital number and ask for the Internal Medicine Resident On-Call. If you need medication refills, please notify your pharmacy one week in advance and they will send [name] a request.   Thank you for letting us take part in your care. Wishing you the best!  Lucas Rainwater, MD 11/22/2020, 1:55 PM IM Resident, PGY-2 Lucas Cunningham 41:10

## 2020-11-22 NOTE — Assessment & Plan Note (Signed)
Follows closely with neurology and receives monthly IVIG treatments.  Has found PT helpful in the past and currently pending PT referral. -- Neurology appointment on 01/19/2021

## 2020-11-22 NOTE — Assessment & Plan Note (Addendum)
Patient with a history of uncontrolled type 1 diabetes and DKA. Review shows that patient positive insulin autoantibody but negative GAD65 and pancreatic isolate cell antibody and high C-peptide. 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  A1c remains uncontrolled at 13.3 due to difficulty with medication management. Patient unable to administer his own medications and his grandmother has difficulty administering his insulin due to her osteoarthritis.  Patient reports of his lowest blood sugars has been in the 200s. He did not bring his meter today.  CGM was placed last week but patient states he fell off his arm after few days. He denies any weakness, dizziness, shortness of breath, chest pain, headaches or polydipsia but endorses occasional polyuria. Patient was recently transitioned to Lantus from [place] due to coverage issues for [place].  Plan: -- Increase Lantus to 25 units nightly -- Continue Humalog 25 units 3 times daily -- Continue Trulicity 0.75 mg weekly --Pending urine microalbumin/creatinine ratio -- Patient has been referred to endocrinology, pending appointment and evaluation -- Refer to diabetes educator for diabetes education and CGM placement. -- 1 week follow-up for CGM reading

## 2020-11-23 DIAGNOSIS — R809 Proteinuria, unspecified: Secondary | ICD-10-CM | POA: Insufficient documentation

## 2020-11-23 LAB — MICROALBUMIN / CREATININE URINE RATIO
Creatinine, Urine: 27.7 mg/dL
Microalb/Creat Ratio: 48 mg/g creat — ABNORMAL HIGH (ref 0–29)
Microalbumin, Urine: 13.3 ug/mL

## 2020-11-23 NOTE — Assessment & Plan Note (Signed)
Patient with a history of uncontrolled diabetes found to have urine microalbumin/creatinine ratio of 48. Patient's BP elevated to systolic in the 130s. Would likely benefit from initiation of ACE/ARB for renal protection and BP control. -- Consider starting ACE/ARB during next office visit.

## 2020-11-24 ENCOUNTER — Telehealth: Payer: Self-pay | Admitting: Dietician

## 2020-11-24 NOTE — Progress Notes (Signed)
Internal Medicine Clinic Attending  Case discussed with Dr. Amponsah  At the time of the visit.  We reviewed the resident's history and exam and pertinent patient test results.  I agree with the assessment, diagnosis, and plan of care documented in the resident's note.  

## 2020-11-25 NOTE — Telephone Encounter (Signed)
Got his sensor on and it is running using a reader. He does not have it with him but reports blood sugars are mostly in 200s, He is still working on trying to remember to take his insulin before eating instead of afterwards. We discussed setting a reminder on his phone or reader.

## 2020-11-30 ENCOUNTER — Encounter: Payer: Medicaid Other | Admitting: Student

## 2020-11-30 ENCOUNTER — Ambulatory Visit: Payer: Medicaid Other | Admitting: Dietician

## 2020-11-30 ENCOUNTER — Telehealth: Payer: Self-pay | Admitting: *Deleted

## 2020-11-30 NOTE — Telephone Encounter (Signed)
Called patient regarding his missed appointment/ grandmother answered phone, gave her message to let Lucas Cunningham know he missed this appointment and to call to reschedule this appointment.

## 2020-12-06 ENCOUNTER — Other Ambulatory Visit: Payer: Self-pay | Admitting: *Deleted

## 2020-12-06 NOTE — Patient Outreach (Signed)
Care Coordination  12/06/2020  Girolamo Lortie [birth year] [id number]   Medicaid Managed Care   Unsuccessful Outreach Note  12/06/2020 Name: Arlee Santosuosso MRN: [id number] DOB: 04/05/2000  Referred by: Steffanie Rainwater, MD Reason for referral : High Risk Managed Medicaid (Unsuccessful RNCM follow up outreach)   An unsuccessful telephone outreach was attempted today. The patient was referred to the case management team for assistance with care management and care coordination.   Follow Up Plan: A HIPAA compliant phone message was left for the patient providing contact information and requesting a return call.   [name] RN, BSN [place]  [employer] [employer]

## 2020-12-06 NOTE — Patient Instructions (Signed)
Visit Information ? ?[name]  - as a part of your Medicaid benefit, you are eligible for care management and care coordination services at no cost or copay. I was unable to reach you by phone today but would be happy to help you with your health related needs. Please feel free to call me @ [phone].  ? ?A member of the Managed Medicaid care management team will reach out to you again over the next 14 days.  ? ?Daisia Slomski RN, BSN ?[place]  [facility] ?RN Care Coordinator ?  ?

## 2020-12-08 ENCOUNTER — Telehealth: Payer: Self-pay

## 2020-12-08 NOTE — Telephone Encounter (Signed)
   Telephone encounter was:  Successful.  12/08/2020 Name: Lucas Cunningham MRN: [id number] DOB: 2000/09/30  Lucas Cunningham is a 20 y.o. year old male who is a primary care patient of Amponsah, Flossie Buffy, MD . The community resource team was consulted for assistance with social events in [place], transportation, diabetes support group, adaptive and inclusive recreation.  Care guide performed the following interventions: Spoke with patient confirmed email address [email or web address] sent information about social events in [place], transportation, diabetes support group, adaptive and inclusive recreation. Letter saved in Epic.  Follow Up Plan:  Care guide will follow up with patient by phone over the next 7-10days.  Phillipa Morden, AAS Paralegal, [facility] Care Guide  Embedded Care Coordination [place]  Care Management  [address] [place], [place] [address] ??[email or web address]  ?? [phone]   [email or web address]

## 2020-12-14 ENCOUNTER — Telehealth: Payer: Self-pay

## 2020-12-14 NOTE — Telephone Encounter (Signed)
   Telephone encounter was:  Successful.  12/14/2020 Name: Lucas Cunningham MRN: [id number] DOB: 03-01-2000  Lucas Cunningham is a 20 y.o. year old male who is a primary care patient of Amponsah, Flossie Buffy, MD . The community resource team was consulted for assistance with  social connections.  Care guide performed the following interventions: poke with patient's grandmother and patient gave my name and contact number. He will call me back and let me know he received emailed information.  Follow Up Plan:  No further follow up planned at this time. The patient has been provided with needed resources.  Lucas Cunningham, AAS Paralegal, [facility] Care Guide  Embedded Care Coordination [place]  Care Management  [address] [place], [place] [address] ??[email or web address]  ?? [phone]   [email or web address]

## 2020-12-15 ENCOUNTER — Ambulatory Visit: Payer: Medicaid Other

## 2020-12-20 ENCOUNTER — Other Ambulatory Visit: Payer: Self-pay

## 2020-12-20 ENCOUNTER — Other Ambulatory Visit: Payer: Self-pay | Admitting: *Deleted

## 2020-12-20 NOTE — Patient Outreach (Signed)
Medicaid Managed Care   Nurse Care Manager Note  12/20/2020 Name:  Lucas Cunningham MRN:  [id number] DOB:  [birth year]  Lucas Cunningham is an 20 y.o. year old male who is a primary patient of Amponsah, Charisse March, MD.  The [facility] Managed Care Coordination team was consulted for assistance with:    [name] CIDP  [name] was given information about Medicaid Managed Care Coordination team services today. Lucas Cunningham Patient agreed to services and verbal consent obtained.  Engaged with patient by telephone for follow up visit in response to provider referral for case management and/or care coordination services.   Assessments/Interventions:  Review of past medical history, allergies, medications, health status, including review of consultants reports, laboratory and other test data, was performed as part of comprehensive evaluation and provision of chronic care management services.  SDOH (Social Determinants of Health) assessments and interventions performed: SDOH Interventions    Flowsheet Row Most Recent Value  SDOH Interventions   Housing Interventions Intervention Not Indicated       Care Plan  No Known Allergies  Medications Reviewed Today     Reviewed by Lucas Montane, RN (Registered Nurse) on 12/20/20 at 74  Med List Status: <None>   Medication Order Taking? Sig Documenting Provider Last Dose Status Informant  Accu-Chek FastClix Lancets MISC [id number] No 1 Device by Does not apply route as directed. Use with Fastclix lancet device to check blood sugar up to 8x daily. (diagnosis code E10.65)  Patient not taking: Reported on 12/20/2020   Lucas Hock, MD Not Taking Active   acetone, urine, test strip [id number] No 1 strip by Does not apply route as needed for high blood sugar. Up to 8x daily if necessary  Patient not taking: No sig reported   Lucas Hock, MD Not Taking Active   atorvastatin (LIPITOR) 10 MG tablet [id number] Yes Take 1 tablet (10 mg total) by mouth  daily. Masters, Scientist, research (physical sciences), DO Taking Active   Continuous Blood Gluc Receiver (DEXCOM G6 RECEIVER) DEVI [id number] Yes 1 Device by Does not apply route as directed. Lucas Hock, MD Taking Active   Continuous Blood Gluc Receiver [facility] G6 RECEIVER) [name] [id number] Yes USE 1 DEVICE AS DIRECTED. Lucas Hock, MD Taking Active   Continuous Blood Gluc Sensor (DEXCOM G6 SENSOR) MISC [id number] Yes INJECT 1 APPLICATOR INTO THE SKIN AS DIRECTED. (CHANGE SENSOR EVERY 10 DAYS) Lucas Hock, MD Taking Active   Continuous Blood Gluc Sensor (DEXCOM G6 SENSOR) MISC [id number] Yes Inject 1 applicator into the skin as directed. (change sensor every 10 days) Lucas Corpus, MD Taking Active   Continuous Blood Gluc Transmit (DEXCOM G6 TRANSMITTER) MISC [id number] Yes INJECT 1 DEVICE INTO THE SKIN AS DIRECTED. (RE-USE UP TO 8X WITH EACH NEW SENSOR) Lucas Hock, MD Taking Active   Continuous Blood Gluc Transmit (DEXCOM G6 TRANSMITTER) MISC [id number] Yes Inject 1 Device into the skin as directed. (re-use up to 8x with each new sensor) Lucas Corpus, MD Taking Active   Dulaglutide (TRULICITY) 1.27 NT/7.0YF SOPN [id number] Yes Inject 0.75 mg into the skin once a week. Lucas Payment, MD Taking Active   gabapentin (NEURONTIN) 300 MG capsule [id number] No Take 1 capsule (300 mg total) by mouth 2 (two) times daily.  Patient not taking: Reported on 12/20/2020   Lucas Axon, MD Not Taking Active   Glucose Blood (BLOOD GLUCOSE TEST STRIPS) STRP [id number] No 1 Device by Other route as directed. To check blood sugar up to 8x per day  Patient not taking: Reported on 12/20/2020   Lucas Hock, MD Not Taking Active   insulin degludec [facility]) 100 UNIT/ML FlexTouch Pen [id number] Yes Inject into the skin daily. [provider] Taking Active            Med Note (Marchello Rothgeb A   Mon Dec 20, 2020  2:23 PM) Taking until it runs out  insulin glargine (LANTUS SOLOSTAR) 100 UNIT/ML Solostar  Pen [id number] No Inject 25 Units into the skin at bedtime. Please start using this medication once you have run out of [place].  Patient not taking: Reported on 12/20/2020   Lucas Axon, MD Not Taking Active            Med Note (Ardine Iacovelli A   Mon Dec 20, 2020  2:22 PM) Taking tresiba until he runs out of it  insulin lispro (HUMALOG KWIKPEN) 200 UNIT/ML KwikPen [id number] Yes Inject up to 100 units daily per provider instructions Lucas Corpus, MD Taking Active   Insulin Pen Needle (BD PEN NEEDLE NANO U/F) 32G X 4 MM MISC [id number] Yes Inject up to 8 times per day. Lucas Payment, MD Taking Active   Lancets Misc. (ACCU-CHEK FASTCLIX LANCET) KIT [id number] No 1 Device by Does not apply route as directed. Use with lancets to check blood sugar up to 8x daily.  (diagnosis code E10.65)  Patient not taking: Reported on 12/20/2020   Lucas Hock, MD Not Taking Active   PRESCRIPTION MEDICATION [id number] Yes Inject into the vein. IVIG every 4 weeks. Order for new start sent to [place] on 04/08/2020. 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 ([place]) 02/26/2019   Postprandial bloating 02/25/2019   Sensory problems with limbs 02/25/2019   Uncontrolled type 1 diabetes mellitus with hyperglycemia ([place]) 05/24/2017   Morbid obesity ([place]) 05/24/2017   Acanthosis 05/24/2017    Conditions to be addressed/monitored per PCP order:   [name], CIDP  Care Plan : General Plan of Care (Adult)  Updates made by Lucas Montane, RN since 12/20/2020 12:00 AM     Problem: Health Promotion or Disease Self-Management (General Plan  of Care)      Long-Range Goal: Self-Management Plan Developed   Start Date: 06/29/2020  Expected End Date: 02/11/2021  Recent Progress: On track  Priority: High  Note:   Current Barriers:  Ineffective Self Health Maintenance-RNCM spoke with Esaw Dace, patient's grandmother, today. She was not at home and has the only phone. She reports not hearing anything regarding the DME ordered by PCP on 5/20. It looks like a second order was placed for walker on 6/13. She is unsure if [name] is using the CGM and requested a call back in 2 hours. RNCM returned call to speak with Javelle. 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 He is currently checking BS with glucometer, readings 250-270 preprandial and greater than 300 postprandial. He reports taking his insulin as prescribed. Reports that he has cut back on fast food and is eating more fruits and vegetables. Today Ashan reports using Dexcom last, one week ago. He is not using it due to needing "a  part that goes to it". RNCM discussed the different parts and explained that he has refills for sensors. Akshaj's Diabetic Education appointment was cancelled due to insurance coverage. Grandmother misunderstood and thought that he needed to find a new PCP. Patient has received a new walker and shower chair.-Today Piero reports having all pieces to his Dexcom and he is using it. Blood sugars 200-300. He is unaware that he missed Diabetic Education appointment. He reports having an eye visit on 10/15 and follow up PCP 10/3 and having transportation to both. RNCM spoke with DPR/grandmother today. She was unaware of needed appointments with Endocrinology and PT, she will call to get these appointments scheduled. DPR unsure if [name] is using Dexcom, but will check when she returns to home. She is concerned that [place] does not leave the house and is interested in social connections. He has an upcoming eye exam.-Update-RNCM spoke with [place] today. He reports wearing CGM  and trying to manage his blood sugars. He is now taking his humalog before meals, but forgot to take tresiba last night-fasting this am 220.  [name] knows that he needs to schedule a follow up with PCP and will do so today. He was unaware of needing appointment with Endocrinology.  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  Knowledge Deficits related to short term plan for care coordination needs and long term plans for chronic disease management needs Nurse Case Manager Clinical Goal(s):  patient will work with care management team to address care coordination and chronic disease management needs related to Disease Management   Interventions:  Evaluation of current treatment plan related to [name] and CIDP and patient's adherence to plan as established by provider. Advised to call Internal Medicine [phone] and reschedule a follow up visit Advised patient to call [facility] [phone] to schedule an appointment(referred by PCP) Attend Neurology appointment on 01/19/21 Discussed plans with patient for ongoing care management follow up and provided patient with direct contact information for care management team RNCM will follow closely Discussed the importance of taking his medications consistently at prescribed times Discussed benefits provided by [facility] and encouraged patient to call [phone] and inquire about a free phone and Healthy Rewards-revisited Encouraged patient to adhere to a diabetic diet, education provided Reviewed medications, discussed the importance of compliance and ways to improve compliance-set alarms Self Care Activities:  Patient will self administer medications as prescribed Patient will attend all scheduled provider appointments Patient will call pharmacy for medication refills Patient will call provider office for new concerns or questions Patient Goals:  -  adhere to a diabetic diet, eat three small meals a day with protein rich snacks - change to whole grain breads, cereal, pasta - drink 6 to 8 glasses of water each day - limit fast food meals to no more than 1 per week - set a realistic goal - switch to low-fat or skim milk  - call [facility] [phone], [place], [phone], to schedule an appointment(referred by PCP) - Call to rescheduled missed PCP appointment [phone] - arrange a ride through an agency 1 week before appointment, call [phone] for medical transportation provided by [facility] -contact [facility] for free phone benefits and over-the-counter medication benefits [phone], request Dental providers in [place] accepting your insurance - call to cancel appointments if needed - keep a calendar with prescription refill dates - keep a calendar with appointment dates - schedule and keep appointment for annual  check-up  - follow up on resources for social connections provided by Care Guide Follow Up Plan: Telephone follow up appointment with care management team member scheduled for:01/03/21 @ 3:45pm      Follow Up:  Patient agrees to Care Plan and Follow-up.  Plan: The Managed Medicaid care management team will reach out to the patient again over the next 14 days.  Date/time of next scheduled RN care management/care coordination outreach:  01/03/21 @ 3:45pm  Lurena Joiner RN, BSN   [employer] manager

## 2020-12-20 NOTE — Patient Instructions (Signed)
Visit Information  Lucas Cunningham was given information about Medicaid Managed Care team care coordination services as a part of their [facility] Medicaid benefit. Lucas Cunningham verbally consented to engagement with the [facility] Managed Care team.   If you are experiencing a medical emergency, please call 911 or report to your local emergency department or urgent care.   If you have a non-emergency medical problem during routine business hours, please contact your provider's office and ask to speak with a nurse.   For questions related to your [facility] health plan, please call: [phone] or go here:[email or web address]  If you would like to schedule transportation through your [facility] plan, please call the following number at least 2 days in advance of your appointment: [phone].  Call the [facility] Crisis Line at [phone], at any time, 24 hours a day, 7 days a week. If you are in danger or need immediate medical attention call 911.  If you would like help to quit smoking, call 1-800-QUIT-NOW ((807)868-6957) OR Espaol: 1-855-Djelo-Ya (4-627-035-0093) o para ms informacin haga clic aqu or Text READY to 818-299 to register via text  [name] - following are the goals we discussed in your visit today:   Goals Addressed             This Visit's Progress    Eat Healthy       Timeframe:  Long-Range Goal Priority:  High Start Date:  06/29/20                             Expected End Date:  ongoing                  Follow Up Date 01/03/21   - adhere to a diabetic diet, eat three small meals a day with protein rich snacks - change to whole grain breads, cereal, pasta - drink 6 to 8 glasses of water each day - limit fast food meals to no more than 1 per week - set a realistic goal - switch to low-fat or skim milk    Why is this important?   When you are ready to manage your nutrition or weight, having a plan and setting goals will help.   Taking small steps to change how you eat and exercise is a good place to start.         Make and Keep All Appointments       Timeframe:  Long-Range Goal Priority:  High Start Date:   06/29/20                        Expected End Date:  01/03/21                   Follow Up Date 01/03/21   - call [facility] [phone], [address] [place], [address], to schedule an appointment(referred by PCP) - Call to rescheduled missed PCP appointment [phone] - arrange a ride through an agency 1 week before appointment, call [phone] for medical transportation provided by [facility] -contact [facility] for free phone benefits and over-the-counter medication benefits [phone], request Dental providers in [place] accepting your insurance - call to cancel appointments if needed - keep a calendar with prescription refill dates - keep a calendar with appointment dates    Why is this important?   Part of staying healthy is seeing the doctor for follow-up care.  If you forget your appointments, there  are some things you can do to stay on track.         Protect My Health       Timeframe:  Long-Range Goal Priority:  High Start Date:  06/11/20                           Expected End Date: 01/03/21                      Follow Up Date  01/03/21   - schedule and keep appointment for annual check-up  - follow up on resources for social connections provided by Care Guide   Why is this important?   Screening tests can find diseases early when they are easier to treat.  Your doctor or nurse will talk with you about which tests are important for you.  Getting shots for common diseases like the flu and shingles will help prevent them.             Please see education materials related to CIDP and diabetes provided as print materials.   The patient verbalized understanding of instructions provided today and agreed to receive a mailed copy of patient instruction and/or educational  materials.  Telephone follow up appointment with Managed Medicaid care management team member scheduled for:01/03/21 @ 3:45pm  Lucas Emms RN, BSN [place]  [facility] RN Care Coordinator   Following is a copy of your plan of care:  Care Plan : General Plan of Care (Adult)  Updates made by Heidi Dach, RN since 12/20/2020 12:00 AM     Problem: Health Promotion or Disease Self-Management (General Plan of Care)      Long-Range Goal: Self-Management Plan Developed   Start Date: 06/29/2020  Expected End Date: 02/11/2021  Recent Progress: On track  Priority: High  Note:   Current Barriers:  Ineffective Self Health Maintenance-RNCM spoke with Lucas Cunningham, patient's grandmother, today. She was not at home and has the only phone. She reports not hearing anything regarding the DME ordered by PCP on 5/20. It looks like a second order was placed for walker on 6/13. She is unsure if [name] is using the CGM and requested a call back in 2 hours. RNCM returned call to speak with Lucas Cunningham. 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 He is currently checking BS with glucometer, readings 250-270 preprandial and greater than 300 postprandial. He reports taking his insulin as prescribed. Reports that he has cut back on fast food and is eating more fruits and vegetables. Today Lucas Cunningham reports using Dexcom last, one week ago. He is not using it due to needing "a part that goes to it". RNCM discussed the different parts and explained that he has refills for sensors. Lucas Cunningham's Diabetic Education appointment was cancelled due to insurance coverage. Grandmother misunderstood and thought that he needed to find a new PCP. Patient has received a new walker and shower chair.-Today [name] reports having all pieces to his Dexcom and he is using it. Blood sugars 200-300. He is unaware that he missed Diabetic Education appointment. He reports having an eye visit on 10/15 and follow up PCP 10/3 and having  transportation to both. RNCM spoke with DPR/grandmother today. She was unaware of needed appointments with Endocrinology and PT, she will call to get these appointments scheduled. DPR unsure if [name] is using Dexcom, but will check when she returns to home. She is concerned that [place] does not leave  the house and is interested in social connections. He has an upcoming eye exam.-Update-RNCM spoke with [place] today. He reports wearing CGM and trying to manage his blood sugars. He is now taking his humalog before meals, but forgot to take tresiba last night-fasting this am 220.  [name] knows that he needs to schedule a follow up with PCP and will do so today. He was unaware of needing appointment with Endocrinology.  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  Knowledge Deficits related to short term plan for care coordination needs and long term plans for chronic disease management needs Nurse Case Manager Clinical Goal(s):  patient will work with care management team to address care coordination and chronic disease management needs related to Disease Management   Interventions:  Evaluation of current treatment plan related to [name] and CIDP and patient's adherence to plan as established by provider. Advised to call Internal Medicine [phone] and reschedule a follow up visit Advised patient to call [facility] [phone] to schedule an appointment(referred by PCP) Attend Neurology appointment on 01/19/21 Discussed plans with patient for ongoing care management follow up and provided patient with direct contact information for care management team RNCM will follow closely Discussed the importance of taking his medications consistently at prescribed times Discussed benefits provided by [facility] and encouraged patient to call [phone] and inquire about a free phone and Healthy  Rewards-revisited Encouraged patient to adhere to a diabetic diet, education provided Reviewed medications, discussed the importance of compliance and ways to improve compliance-set alarms Self Care Activities:  Patient will self administer medications as prescribed Patient will attend all scheduled provider appointments Patient will call pharmacy for medication refills Patient will call provider office for new concerns or questions Patient Goals:  - adhere to a diabetic diet, eat three small meals a day with protein rich snacks - change to whole grain breads, cereal, pasta - drink 6 to 8 glasses of water each day - limit fast food meals to no more than 1 per week - set a realistic goal - switch to low-fat or skim milk  - call [facility] [phone], [address] [place], [address], to schedule an appointment(referred by PCP) - Call to rescheduled missed PCP appointment [phone] - arrange a ride through an agency 1 week before appointment, call [phone] for medical transportation provided by [facility] -contact [facility] for free phone benefits and over-the-counter medication benefits [phone], request Dental providers in [place] accepting your insurance - call to cancel appointments if needed - keep a calendar with prescription refill dates - keep a calendar with appointment dates - schedule and keep appointment for annual check-up  - follow up on resources for social connections provided by Care Guide Follow Up Plan: Telephone follow up appointment with care management team member scheduled for:01/03/21 @ 3:45pm

## 2020-12-28 ENCOUNTER — Other Ambulatory Visit: Payer: Self-pay

## 2020-12-28 NOTE — Patient Outreach (Signed)
Medicaid Managed Care Pharmacy Note  03/28/2021 Name:  Lucas Cunningham MRN:  [id number] DOB:  02-Apr-2000  Lucas Cunningham is an 20 y.o. year old male who is a primary patient of A[place], Charisse March, MD.  The [facility] Managed Care Coordination team was consulted for assistance with disease management and care coordination needs.    Engaged with patient by telephone for follow up visit in response to referral for case management and/or care coordination services.  Lucas Cunningham was given information about Medicaid Managed Care Coordination team services today. Lucas Cunningham Patient agreed to services and verbal consent obtained.  Objective:  Lab Results  Component Value Date   CREATININE 0.48 (L) 06/24/2020   CREATININE 0.47 (L) 05/25/2020   CREATININE 0.46 (L) 02/18/2020    Lab Results  Component Value Date   HGBA1C 13.3 (A) 09/27/2020   BP Readings from Last 3 Encounters:  02/24/21 115/83  01/19/21 134/83  11/22/20 137/84     Care Plan  No Known Allergies  Medications Reviewed Today     Reviewed by Drema Balzarine, PTA (Physical Therapy Assistant) on 03/25/21 at 1451  Med List Status: <None>   Medication Order Taking? Sig Documenting Provider Last Dose Status Informant  atorvastatin (LIPITOR) 10 MG tablet [id number] No TAKE ONE TABLET BY MOUTH ONCE DAILY Lacinda Axon, MD Taking Active   Continuous Blood Gluc Receiver (DEXCOM G6 RECEIVER) DEVI [id number] No 1 Device by Does not apply route as directed. Sherrlyn Hock, MD Taking Active   Continuous Blood Gluc Sensor (DEXCOM G6 SENSOR) MISC [id number] No Inject 1 applicator into the skin as directed. (change sensor every 10 days) Al Corpus, MD Taking Active   Continuous Blood Gluc Transmit (DEXCOM G6 TRANSMITTER) MISC [id number] No Inject 1 Device into the skin as directed. (re-use up to 8x with each new sensor) Al Corpus, MD Taking Active   Dulaglutide (TRULICITY [place]) [id number] No Inject into the skin once a week.  [provider] Taking Active   GAMUNEX-C 20 GM/200ML SOLN [id number] No  [provider] Taking Active   GAMUNEX-C 5 GM/50ML SOLN [id number] No  [provider] Taking Active   insulin degludec (TRESIBA FLEXTOUCH) 100 UNIT/ML FlexTouch Pen [id number] No Inject into the skin daily. [provider] Taking Active            Med Note (ROBB, MELANIE A   Mon Dec 20, 2020  2:23 PM) Taking until it runs out  insulin lispro (HUMALOG KWIKPEN) 200 UNIT/ML KwikPen [id number] No Inject up to 100 units daily per provider instructions Al Corpus, MD Taking Active   Insulin Pen Needle (BD PEN NEEDLE NANO U/F) 32G X 4 MM MISC [id number] No Inject up to 8 times per day. Marianna Payment, MD Taking Active   PRESCRIPTION MEDICATION [id number] No Inject into the vein. IVIG every 4 weeks. Order for new start sent to [facility] on 04/08/2020. 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

## 2020-12-28 NOTE — Patient Instructions (Signed)
Visit Information  Lucas Cunningham was given information about Medicaid Managed Care team care coordination services as a part of their [facility] Medicaid benefit. Lucas Cunningham verbally consented to engagement with the [facility] Managed Care team.   If you are experiencing a medical emergency, please call 911 or report to your local emergency department or urgent care.   If you have a non-emergency medical problem during routine business hours, please contact your provider's office and ask to speak with a nurse.   For questions related to your [facility] health plan, please call: [phone] or go here:[email or web address]  If you would like to schedule transportation through your [facility] plan, please call the following number at least 2 days in advance of your appointment: [phone].  Call the [facility] Crisis Line at [phone], at any time, 24 hours a day, 7 days a week. If you are in danger or need immediate medical attention call 911.  If you would like help to quit smoking, call 1-800-QUIT-NOW ((207)303-2649) OR Espaol: 1-855-Djelo-Ya (7-096-283-6629) o para ms informacin haga clic aqu or Text READY to 476-546 to register via text  Lucas Cunningham - following are the goals we discussed in your visit today:   Goals Addressed   None     The patient verbalized understanding of instructions provided today and declined a print copy of patient instruction materials.   RN Care Manager will call on 01/03/21 Pharmacist will call on 01/25/21 Next PCP appointment:  01/04/21  Cheral Almas PharmD, CPP High Risk Managed Medicaid [place] [phone]  Following is a copy of your plan of care:  There are no care plans that you recently modified to display for this patient.

## 2021-01-03 ENCOUNTER — Other Ambulatory Visit: Payer: Self-pay | Admitting: *Deleted

## 2021-01-03 ENCOUNTER — Other Ambulatory Visit: Payer: Self-pay

## 2021-01-03 NOTE — Patient Outreach (Signed)
Medicaid Managed Care   Nurse Care Manager Note  01/03/2021 Name:  Lucas Cunningham MRN:  [id number] DOB:  [birth year]  Lucas Cunningham is an 20 y.o. year old male who is a primary patient of Cunningham, Lucas March, MD.  The [facility] Managed Care Coordination team was consulted for assistance with:    [name]  Mr. Stroebel was given information about Medicaid Managed Care Coordination team services today. Lucas Cunningham Patient agreed to services and verbal consent obtained.  Engaged with patient by telephone for follow up visit in response to provider referral for case management and/or care coordination services.   Assessments/Interventions:  Review of past medical history, allergies, medications, health status, including review of consultants reports, laboratory and other test data, was performed as part of comprehensive evaluation and provision of chronic care management services.  SDOH (Social Determinants of Health) assessments and interventions performed: SDOH Interventions    Flowsheet Row Most Recent Value  SDOH Interventions   Transportation Interventions Intervention Not Indicated       Care Plan  No Known Allergies  Medications Reviewed Today     Reviewed by Melissa Montane, RN (Registered Nurse) on 01/03/21 at 1600  Med List Status: <None>   Medication Order Taking? Sig Documenting Provider Last Dose Status Informant  Accu-Chek FastClix Lancets MISC [id number]  1 Device by Does not apply route as directed. Use with Fastclix lancet device to check blood sugar up to 8x daily. (diagnosis code E10.65)  Patient not taking: Reported on 12/20/2020   Lucas Hock, MD  Active   acetone, urine, test strip [id number]  1 strip by Does not apply route as needed for high blood sugar. Up to 8x daily if necessary  Patient not taking: No sig reported   Lucas Hock, MD  Active   atorvastatin (LIPITOR) 10 MG tablet [id number]  Take 1 tablet (10 mg total) by mouth daily. Masters, Katie, DO   Expired 12/28/20 2359   Continuous Blood Gluc Receiver (DEXCOM G6 RECEIVER) DEVI [id number] Yes 1 Device by Does not apply route as directed. Lucas Hock, MD Taking Active   Continuous Blood Gluc Sensor (DEXCOM G6 SENSOR) MISC [id number] Yes Inject 1 applicator into the skin as directed. (change sensor every 10 days) Al Corpus, MD Taking Active   Continuous Blood Gluc Transmit (DEXCOM G6 TRANSMITTER) MISC [id number] Yes Inject 1 Device into the skin as directed. (re-use up to 8x with each new sensor) Al Corpus, MD Taking Active   Dulaglutide [facility]) [id number] Yes Inject into the skin once a week. [provider] Taking Active   gabapentin (NEURONTIN) 300 MG capsule [id number] No Take 1 capsule (300 mg total) by mouth 2 (two) times daily.  Patient not taking: Reported on 12/28/2020   Lucas Axon, MD Not Taking Active   GAMUNEX-C 20 GM/200ML SOLN [id number]   [provider]  Active   [facility] 5 GM/50ML SOLN [id number]   [provider]  Active   Glucose Blood (BLOOD GLUCOSE TEST STRIPS) STRP [id number] No 1 Device by Other route as directed. To check blood sugar up to 8x per day  Patient not taking: Reported on 12/20/2020   Lucas Hock, MD Not Taking Active   insulin degludec (TRESIBA FLEXTOUCH) 100 UNIT/ML FlexTouch Pen [id number] Yes Inject into the skin daily. [provider] Taking Active            Med Note Lucas Cunningham, Lucas Cunningham A   Mon Dec 20, 2020  2:23 PM) Taking until  it runs out  insulin glargine (LANTUS SOLOSTAR) 100 UNIT/ML Solostar Pen [id number] No Inject 25 Units into the skin at bedtime. Please start using this medication once you have run out of [place].  Patient not taking: Reported on 12/20/2020   Lucas Axon, MD Not Taking Active            Med Note (Mart Colpitts A   Mon Dec 20, 2020  2:22 PM) Taking tresiba until he runs out of it  insulin lispro (HUMALOG KWIKPEN) 200 UNIT/ML KwikPen [id number] Yes Inject up to  100 units daily per provider instructions Al Corpus, MD Taking Active   Insulin Pen Needle (BD PEN NEEDLE NANO U/F) 32G X 4 MM MISC [id number]  Inject up to 8 times per day. Lucas Payment, MD  Active   Lancets Misc. (ACCU-CHEK FASTCLIX LANCET) KIT [id number]  1 Device by Does not apply route as directed. Use with lancets to check blood sugar up to 8x daily.  (diagnosis code E10.65)  Patient not taking: No sig reported   Lucas Hock, MD  Active   PRESCRIPTION MEDICATION [id number] Yes Inject into the vein. IVIG every 4 weeks. Order for new start sent to [facility] on 04/08/2020. 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   Noncompliance with medication treatment due to difficulty with dosing 05/25/2020   Mixed hyperlipidemia 05/25/2020   Depression 05/25/2020   Diabetic polyneuropathy associated with type 1 diabetes mellitus ([place]) 02/26/2019   Postprandial bloating 02/25/2019   Sensory problems with limbs 02/25/2019   Uncontrolled type 1 diabetes mellitus with hyperglycemia ([place]) 05/24/2017   Morbid obesity ([place]) 05/24/2017   Acanthosis 05/24/2017    Conditions to be addressed/monitored per PCP order:   [name]  Care Plan : General Plan of Care (Adult)  Updates made by Melissa Montane, RN since 01/03/2021 12:00 AM  Completed 01/03/2021   Problem: Interdisciplinary Collaborative/Care Coordination Resolved 01/03/2021  Priority: High  Onset Date: 06/11/2020     Long-Range Goal: Patient-Specific Goal Completed 01/03/2021  Start Date: 06/11/2020  Expected End Date: 09/10/2020  Recent Progress: On track  Priority: High  Note:   Current Barriers:  Care Management support, education, and care coordination needs related to DM Case Manager Clinical Goal(s):  patient will work with BSW to address needs related to  Level of care concerns and ADL IADL limitations in patient with DM Interventions:  Collaborated with BSW to initiate plan of care to address needs related to Level of care concerns and ADL IADL limitations in patient with DM Self Care Activities:  Patient will attend all scheduled provider appointments Patient will work with BSW to address care coordination needs and will continue to work with the clinical team to address health care and disease management related needs.   Patient Goals: - schedule and keep appointment for annual check-up  - work with Ubaldo Glassing, BSW for assistance with arranging [facility] for healthcare management Follow up plan:  The Managed Medicaid care management team will reach out to the patient again over the next 14 days.      Problem: Health Promotion or Disease Self-Management (General Plan of Care) Resolved 01/03/2021  Note:   Resolving due to duplicate goal     Long-Range Goal: Self-Management Plan Developed Completed 01/03/2021  Start Date: 06/29/2020  Expected End  Date: 02/11/2021  Recent Progress: On track  Priority: High  Note:   Current Barriers:  Ineffective Self Health Maintenance-RNCM spoke with Esaw Dace, patient's grandmother, today. She was not at home and has the only phone. She reports not hearing anything regarding the DME ordered by PCP on 5/20. It looks like a second order was placed for walker on 6/13. She is unsure if [name] is using the CGM and requested a call back in 2 hours. RNCM returned call to speak with Holman. 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 He is currently checking BS with glucometer, readings 250-270 preprandial and greater than 300 postprandial. He reports taking his insulin as prescribed. Reports that he has cut back on fast food and is eating more fruits and vegetables. Today Dearius reports using Dexcom last, one week ago. He is not using it due to needing "a part that goes to it". RNCM discussed the different parts and  explained that he has refills for sensors. Sircharles's Diabetic Education appointment was cancelled due to insurance coverage. Grandmother misunderstood and thought that he needed to find a new PCP. Patient has received a new walker and shower chair.-Today Dayvin reports having all pieces to his Dexcom and he is using it. Blood sugars 200-300. He is unaware that he missed Diabetic Education appointment. He reports having an eye visit on 10/15 and follow up PCP 10/3 and having transportation to both. RNCM spoke with DPR/grandmother today. She was unaware of needed appointments with Endocrinology and PT, she will call to get these appointments scheduled. DPR unsure if [name] is using Dexcom, but will check when she returns to home. She is concerned that [place] does not leave the house and is interested in social connections. He has an upcoming eye exam.-Update-RNCM spoke with [place] today. He reports wearing CGM and trying to manage his blood sugars. He is now taking his humalog before meals, but forgot to take tresiba last night-fasting this am 220.  [name] knows that he needs to schedule a follow up with PCP and will do so today. He was unaware of needing appointment with Endocrinology.  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  Knowledge Deficits related to short term plan for care coordination needs and long term plans for chronic disease management needs Resolving due to duplicate goal Nurse Case Manager Clinical Goal(s):  patient will work with care management team to address care coordination and chronic disease management needs related to Disease Management   Interventions:  Evaluation of current treatment plan related to [name] and CIDP and patient's adherence to plan as established by provider. Advised to call Internal Medicine [phone] and reschedule a follow up visit Advised patient to call [facility] [phone] to schedule an appointment(referred by PCP) Attend Neurology appointment on 01/19/21 Discussed plans with patient for ongoing care management follow up and provided patient with direct contact information for care management team RNCM will follow closely Discussed the importance of taking his medications consistently at prescribed times Discussed benefits provided by [facility] and encouraged patient to call [phone] and inquire about a free phone and [employer]-revisited Encouraged patient to adhere to a diabetic diet, education provided Reviewed medications, discussed the importance of compliance and ways to improve compliance-set alarms Self Care Activities:  Patient will self administer medications as prescribed Patient will attend all scheduled provider appointments Patient will call pharmacy for medication refills Patient will call provider office for new concerns  or questions Patient Goals:  - adhere to a diabetic diet, eat three small meals a day with protein rich snacks - change to whole grain breads, cereal, pasta - drink 6 to 8 glasses of water each day - limit fast food meals to no more than 1 per week - set a realistic goal - switch to low-fat or skim milk  - call [facility] [phone], [place], [address], to schedule an appointment(referred by PCP) - Call to rescheduled missed PCP appointment [phone] - arrange a ride through an agency 1 week before appointment, call [phone] for medical transportation provided by [facility] -contact [facility] for free phone benefits and over-the-counter medication benefits [phone], request Dental providers in [place] accepting your insurance - call to cancel appointments if needed - keep a calendar with prescription refill dates - keep a calendar with appointment dates - schedule and keep appointment for annual check-up  - follow up on resources for social connections  provided by Care Guide Follow Up Plan: Telephone follow up appointment with care management team member scheduled for:01/03/21 @ 3:45pm     Care Plan : [place] of Care  Updates made by Melissa Montane, RN since 01/03/2021 12:00 AM     Problem: Knowledge Deficits and Care Coordination related to managing DM   Priority: High     Long-Range Goal: Development of plan of care to address needs and knowledge deficits related to [name]   Start Date: 01/03/2021  Expected End Date: 04/03/2021  Priority: High  Note:   Current Barriers:  Chronic Disease Management support and education needs related to [place]  RNCM Clinical Goal(s):  Patient will verbalize understanding of plan for management of [name] as evidenced by patient's verbalization of and self management activities take all medications exactly as prescribed and will call provider for medication related questions as evidenced by documentation in EMR and improving lab values    attend all scheduled medical appointments: 01/04/21 at 3:45pm with PCP  as evidenced by physician documentation in EMR        demonstrate improved adherence to prescribed treatment plan for [name] as evidenced by taking all medications as directed, eating a diabetic diet and attending appointments with PCP and specialist  through collaboration with RN Care manager, provider, and care team.   Interventions: Inter-disciplinary care team collaboration (see longitudinal plan of care) Evaluation of current treatment plan related to  self management and patient's adherence to plan as established by provider   Diabetes:  (Status: New goal.) Long Term Goal   Lab Results  Component Value Date   HGBA1C 13.3 (A) 09/27/2020  Assessed patient's understanding of A1c goal: <7% Provided education to patient about basic DM disease process; Reviewed medications with patient and discussed importance of medication adherence;        Reviewed prescribed diet with patient  diabetic diet, examples given; Discussed plans with patient for ongoing care management follow up and provided patient with direct contact information for care management team;      Reviewed scheduled/upcoming provider appointments including: PCP 01/04/21 and calling to reschedule missed Eye Exam;         Review of patient status, including review of consultants reports, relevant laboratory and other test results, and medications completed;       Assessed social determinant of health barriers;        Advised patient to take medications and dexcom to PCP appointment on 01/04/21 Discussed methods to help patient remember to take his  medications at the appropriate time  Patient Goals/Self-Care Activities: Take medications as prescribed   Attend all scheduled provider appointments Call pharmacy for medication refills 3-7 days in advance of running out of medications Call provider office for new concerns or questions  schedule appointment with eye doctor take the blood sugar meter to all doctor visits drink 6 to 8 glasses of water each day fill half of plate with vegetables keep a food diary manage portion size       Follow Up:  Patient agrees to Care Plan and Follow-up.  Plan: The Managed Medicaid care management team will reach out to the patient again over the next 14 days.  Date/time of next scheduled RN care management/care coordination outreach:  01/17/21 @ 2:30pm  Lurena Joiner RN, BSN [place] RN Care Coordinator

## 2021-01-03 NOTE — Patient Instructions (Signed)
Visit Information  Lucas Cunningham was given information about Medicaid Managed Care team care coordination services as a part of their [facility] Medicaid benefit. Lucas Cunningham verbally consented to engagement with the [facility] Managed Care team.   If you are experiencing a medical emergency, please call 911 or report to your local emergency department or urgent care.   If you have a non-emergency medical problem during routine business hours, please contact your provider's office and ask to speak with a nurse.   For questions related to your [facility] health plan, please call: [phone] or go here:[email or web address]  If you would like to schedule transportation through your [facility] plan, please call the following number at least 2 days in advance of your appointment: [phone].  Call the [facility] Crisis Line at [phone], at any time, 24 hours a day, 7 days a week. If you are in danger or need immediate medical attention call 911.  If you would like help to quit smoking, call 1-800-QUIT-NOW (567 750 7170) OR Espaol: 1-855-Djelo-Ya (3-810-175-1025) o para ms informacin haga clic aqu or Text READY to 852-778 to register via text  Mr. Stocking - following are the goals we discussed in your visit today:   Goals Addressed             This Visit's Progress    COMPLETED: Eat Healthy       Resolving due to duplicate goal  Timeframe:  Long-Range Goal Priority:  High Start Date:  06/29/20                             Expected End Date:  ongoing                  Follow Up Date 01/03/21   - adhere to a diabetic diet, eat three small meals a day with protein rich snacks - change to whole grain breads, cereal, pasta - drink 6 to 8 glasses of water each day - limit fast food meals to no more than 1 per week - set a realistic goal - switch to low-fat or skim milk    Why is this important?   When you are ready to manage your nutrition or weight,  having a plan and setting goals will help.  Taking small steps to change how you eat and exercise is a good place to start.         COMPLETED: Make and Keep All Appointments       Resolving due to duplicate goal  Timeframe:  Long-Range Goal Priority:  High Start Date:   06/29/20                        Expected End Date:  01/03/21                   Follow Up Date 01/03/21   - call [facility] [phone], [address] [place], [id number], to schedule an appointment(referred by PCP) - Call to rescheduled missed PCP appointment [phone] - arrange a ride through an agency 1 week before appointment, call [phone] for medical transportation provided by [facility] -contact [facility] for free phone benefits and over-the-counter medication benefits [phone], request Dental providers in [place] accepting your insurance - call to cancel appointments if needed - keep a calendar with prescription refill dates - keep a calendar with appointment dates    Why is this important?   Part of staying healthy  is seeing the doctor for follow-up care.  If you forget your appointments, there are some things you can do to stay on track.         COMPLETED: Protect My Health       Resolving due to duplicate goal  Timeframe:  Long-Range Goal Priority:  High Start Date:  06/11/20                           Expected End Date: 01/03/21                      Follow Up Date  01/03/21   - schedule and keep appointment for annual check-up  - follow up on resources for social connections provided by Care Guide   Why is this important?   Screening tests can find diseases early when they are easier to treat.  Your doctor or nurse will talk with you about which tests are important for you.  Getting shots for common diseases like the flu and shingles will help prevent them.             Please see education materials related to diabetes provided as print materials.   The patient  verbalized understanding of instructions provided today and agreed to receive a mailed copy of patient instruction and/or educational materials.  Telephone follow up appointment with Managed Medicaid care management team member scheduled for:01/17/21 @ 2:30pm  Lucas Emms RN, BSN [place]  [facility] RN Care Coordinator   Following is a copy of your plan of care:  Care Plan : General Plan of Care (Adult)  Updates made by Lucas Dach, RN since 01/03/2021 12:00 AM  Completed 01/03/2021   Problem: Interdisciplinary Collaborative/Care Coordination Resolved 01/03/2021  Priority: High  Onset Date: 06/11/2020     Long-Range Goal: Patient-Specific Goal Completed 01/03/2021  Start Date: 06/11/2020  Expected End Date: 09/10/2020  Recent Progress: On track  Priority: High  Note:   Current Barriers:  Care Management support, education, and care coordination needs related to DM Case Manager Clinical Goal(s):  patient will work with BSW to address needs related to Level of care concerns and ADL IADL limitations in patient with DM Interventions:  Collaborated with BSW to initiate plan of care to address needs related to Level of care concerns and ADL IADL limitations in patient with DM Self Care Activities:  Patient will attend all scheduled provider appointments Patient will work with BSW to address care coordination needs and will continue to work with the clinical team to address health care and disease management related needs.   Patient Goals: - schedule and keep appointment for annual check-up  - work with Lucas Cunningham, BSW for assistance with arranging [facility] for healthcare management Follow up plan:  The Managed Medicaid care management team will reach out to the patient again over the next 14 days.      Problem: Health Promotion or Disease Self-Management (General Plan of Care) Resolved 01/03/2021  Note:   Resolving due to duplicate goal     Long-Range Goal:  Self-Management Plan Developed Completed 01/03/2021  Start Date: 06/29/2020  Expected End Date: 02/11/2021  Recent Progress: On track  Priority: High  Note:   Current Barriers:  Ineffective Self Health Maintenance-RNCM spoke with Lucas Cunningham, patient's grandmother, today. She was not at home and has the only phone. She reports not hearing anything regarding the DME ordered by PCP on 5/20. It looks  like a second order was placed for walker on 6/13. She is unsure if Lucas Cunningham is using the CGM and requested a call back in 2 hours. RNCM returned call to speak with Lucas Cunningham. 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 He is currently checking BS with glucometer, readings 250-270 preprandial and greater than 300 postprandial. He reports taking his insulin as prescribed. Reports that he has cut back on fast food and is eating more fruits and vegetables. Today Lucas Cunningham reports using Dexcom last, one week ago. He is not using it due to needing "a part that goes to it". RNCM discussed the different parts and explained that he has refills for sensors. Lucas Cunningham's Diabetic Education appointment was cancelled due to insurance coverage. Grandmother misunderstood and thought that he needed to find a new PCP. Patient has received a new walker and shower chair.-Today Lucas Cunningham reports having all pieces to his Dexcom and he is using it. Blood sugars 200-300. He is unaware that he missed Diabetic Education appointment. He reports having an eye visit on 10/15 and follow up PCP 10/3 and having transportation to both. RNCM spoke with DPR/grandmother today. She was unaware of needed appointments with Endocrinology and PT, she will call to get these appointments scheduled. DPR unsure if Lucas Cunningham is using Dexcom, but will check when she returns to home. She is concerned that Lucas Cunningham does not leave the house and is interested in social connections. He has an upcoming eye exam.-Update-RNCM spoke with Lucas Cunningham today. He reports wearing CGM and trying to  manage his blood sugars. He is now taking his humalog before meals, but forgot to take tresiba last night-fasting this am 220.  Lucas Cunningham knows that he needs to schedule a follow up with PCP and will do so today. He was unaware of needing appointment with Endocrinology.  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  Knowledge Deficits related to short term plan for care coordination needs and long term plans for chronic disease management needs Resolving due to duplicate goal Nurse Case Manager Clinical Goal(s):  patient will work with care management team to address care coordination and chronic disease management needs related to Disease Management   Interventions:  Evaluation of current treatment plan related to [name] and CIDP and patient's adherence to plan as established by provider. Advised to call Internal Medicine [phone] and reschedule a follow up visit Advised patient to call [facility] [phone] to schedule an appointment(referred by PCP) Attend Neurology appointment on 01/19/21 Discussed plans with patient for ongoing care management follow up and provided patient with direct contact information for care management team RNCM will follow closely Discussed the importance of taking his medications consistently at prescribed times Discussed benefits provided by [employer] and encouraged patient to call [phone] and inquire about a free phone and Healthy Rewards-revisited Encouraged patient to adhere to a diabetic diet, education provided Reviewed medications, discussed the importance of compliance and ways to improve compliance-set alarms Self Care Activities:  Patient will self administer medications as prescribed Patient will attend all scheduled provider appointments Patient will call pharmacy for medication refills Patient will call provider office for new concerns or  questions Patient Goals:  - adhere to a diabetic diet, eat three small meals a day with protein rich snacks - change to whole grain breads, cereal, pasta - drink 6 to 8 glasses of water each day - limit fast food meals to no more than 1 per week - set  a realistic goal - switch to low-fat or skim milk  - call [facility] [phone], [address] [place], [place], to schedule an appointment(referred by PCP) - Call to rescheduled missed PCP appointment [phone] - arrange a ride through an agency 1 week before appointment, call [phone] for medical transportation provided by [facility] -contact [facility] for free phone benefits and over-the-counter medication benefits [phone], request Dental providers in [place] accepting your insurance - call to cancel appointments if needed - keep a calendar with prescription refill dates - keep a calendar with appointment dates - schedule and keep appointment for annual check-up  - follow up on resources for social connections provided by Care Guide Follow Up Plan: Telephone follow up appointment with care management team member scheduled for:01/03/21 @ 3:45pm     Care Plan : RN Care Manager Plan of Care  Updates made by Lucas Dach, RN since 01/03/2021 12:00 AM     Problem: Knowledge Deficits and Care Coordination related to managing DM   Priority: High     Long-Range Goal: Development of plan of care to address needs and knowledge deficits related to [name]   Start Date: 01/03/2021  Expected End Date: 04/03/2021  Priority: High  Note:   Current Barriers:  Chronic Disease Management support and education needs related to [name]  RNCM Clinical Goal(s):  Patient will verbalize understanding of plan for management of [name] as evidenced by patient's verbalization of and self management activities take all medications exactly as prescribed and will call provider for medication related questions as evidenced by documentation in EMR  and improving lab values    attend all scheduled medical appointments: 01/04/21 at 3:45pm with PCP  as evidenced by physician documentation in EMR        demonstrate improved adherence to prescribed treatment plan for [name] as evidenced by taking all medications as directed, eating a diabetic diet and attending appointments with PCP and specialist  through collaboration with RN Care manager, provider, and care team.   Interventions: Inter-disciplinary care team collaboration (see longitudinal plan of care) Evaluation of current treatment plan related to  self management and patient's adherence to plan as established by provider   Diabetes:  (Status: New goal.) Long Term Goal   Lab Results  Component Value Date   HGBA1C 13.3 (A) 09/27/2020  Assessed patient's understanding of A1c goal: <7% Provided education to patient about basic DM disease process; Reviewed medications with patient and discussed importance of medication adherence;        Reviewed prescribed diet with patient diabetic diet, examples given; Discussed plans with patient for ongoing care management follow up and provided patient with direct contact information for care management team;      Reviewed scheduled/upcoming provider appointments including: PCP 01/04/21 and calling to reschedule missed Eye Exam;         Review of patient status, including review of consultants reports, relevant laboratory and other test results, and medications completed;       Assessed social determinant of health barriers;        Advised patient to take medications and dexcom to PCP appointment on 01/04/21 Discussed methods to help patient remember to take his medications at the appropriate time  Patient Goals/Self-Care Activities: Take medications as prescribed   Attend all scheduled provider appointments Call pharmacy for medication refills 3-7 days in advance of running out of medications Call provider office for new concerns or questions   schedule appointment with eye doctor take the blood sugar meter  to all doctor visits drink 6 to 8 glasses of water each day fill half of plate with vegetables keep a food diary manage portion size

## 2021-01-04 ENCOUNTER — Encounter: Payer: Medicaid Other | Admitting: Student

## 2021-01-17 ENCOUNTER — Other Ambulatory Visit: Payer: Self-pay | Admitting: *Deleted

## 2021-01-17 ENCOUNTER — Other Ambulatory Visit: Payer: Self-pay

## 2021-01-17 NOTE — Patient Instructions (Signed)
Visit Information  [name] was given information about Medicaid Managed Care team care coordination services as a part of their [facility] Medicaid benefit. Lucas Cunningham verbally consented to engagement with the [facility] Managed Care team.   If you are experiencing a medical emergency, please call 911 or report to your local emergency department or urgent care.   If you have a non-emergency medical problem during routine business hours, please contact your provider's office and ask to speak with a nurse.   For questions related to your [facility] health plan, please call: [phone] or go here:[email or web address]  If you would like to schedule transportation through your [facility] plan, please call the following number at least 2 days in advance of your appointment: [phone].  Call the [facility] Crisis Line at [phone], at any time, 24 hours a day, 7 days a week. If you are in danger or need immediate medical attention call 911.  If you would like help to quit smoking, call 1-800-QUIT-NOW (252 173 9502) OR Espaol: 1-855-Djelo-Ya (6-433-295-1884) o para ms informacin haga clic aqu or Text READY to 166-063 to register via text  [name] - following are the goals we discussed in your visit today:   Goals Addressed   None     Please see education materials related to diabetes provided as print materials.   The patient verbalized understanding of instructions provided today and agreed to receive a mailed copy of patient instruction and/or educational materials.  Telephone follow up appointment with Managed Medicaid care management team member scheduled for:01/20/21 @ 4pm  Lucas Emms RN, BSN [place]  [facility] RN Care Coordinator   Following is a copy of your plan of care:  Care Plan : RN Care Manager Plan of Care  Updates made by Heidi Dach, RN since 01/17/2021 12:00 AM     Problem: Knowledge Deficits and  Care Coordination related to managing DM   Priority: High     Long-Range Goal: Development of plan of care to address needs and knowledge deficits related to [name]   Start Date: 01/03/2021  Expected End Date: 04/03/2021  Priority: High  Note:   Current Barriers:  Chronic Disease Management support and education needs related to [name]  RNCM spoke with patient's DPR/grandmother today. Grandmother was not aware of patient missing PCP appointment on 11/22. She reports patient is doing better, but unsure if he is wearing CGM as directed. Request RNCM to call patient at another time.  RNCM Clinical Goal(s):  Patient will verbalize understanding of plan for management of [name] as evidenced by patient's verbalization of and self management activities take all medications exactly as prescribed and will call provider for medication related questions as evidenced by documentation in EMR and improving lab values    attend all scheduled medical appointments: reschedule missed appointment with PCP and 12/7 with Neurology as evidenced by physician documentation in EMR        demonstrate improved adherence to prescribed treatment plan for [name] as evidenced by taking all medications as directed, eating a diabetic diet and attending appointments with PCP and specialist  through collaboration with RN Care manager, provider, and care team.   Interventions: Inter-disciplinary care team collaboration (see longitudinal plan of care) Evaluation of current treatment plan related to  self management and patient's adherence to plan as established by provider   Diabetes:  (Status: Goal on track: NO.) Long Term Goal   Lab Results  Component Value Date   HGBA1C 13.3 (A) 09/27/2020  Assessed patient's understanding of A1c goal: <7% Provided education to patient about basic DM disease process; Discussed plans with patient for ongoing care management follow up and provided patient with direct contact information for care  management team;      Reviewed scheduled/upcoming provider appointments including: 12/7 at 3pm with Neurology and calling to reschedule missed PCP appointment and Eye Exam;         Review of patient status, including review of consultants reports, relevant laboratory and other test results, and medications completed;         Patient Goals/Self-Care Activities: Take medications as prescribed   Attend all scheduled provider appointments Call pharmacy for medication refills 3-7 days in advance of running out of medications Call provider office for new concerns or questions  schedule appointment with eye doctor take the blood sugar meter to all doctor visits drink 6 to 8 glasses of water each day fill half of plate with vegetables keep a food diary manage portion size

## 2021-01-17 NOTE — Patient Outreach (Signed)
Medicaid Managed Care   Nurse Care Manager Note  01/17/2021 Name:  Lucas Cunningham MRN:  [id number] DOB:  11-20-2000  Lucas Cunningham is an 20 y.o. year old male who is a primary patient of Amponsah, Charisse March, MD.  The [facility] Managed Care Coordination team was consulted for assistance with:    [name]  Lucas Cunningham was given information about Medicaid Managed Care Coordination team services today. [employer] (DPR) agreed to services and verbal consent obtained.  Engaged with patient by telephone for follow up visit in response to provider referral for case management and/or care coordination services.   Assessments/Interventions:  Review of past medical history, allergies, medications, health status, including review of consultants reports, laboratory and other test data, was performed as part of comprehensive evaluation and provision of chronic care management services.  SDOH (Social Determinants of Health) assessments and interventions performed:   Care Plan  No Known Allergies  Medications Reviewed Today     Reviewed by Melissa Montane, RN (Registered Nurse) on 01/17/21 at 69  Med List Status: <None>   Medication Order Taking? Sig Documenting Provider Last Dose Status Informant  Accu-Chek FastClix Lancets MISC [id number] No 1 Device by Does not apply route as directed. Use with Fastclix lancet device to check blood sugar up to 8x daily. (diagnosis code E10.65)  Patient not taking: Reported on 12/20/2020   Sherrlyn Hock, MD Not Taking Active   acetone, urine, test strip [id number] No 1 strip by Does not apply route as needed for high blood sugar. Up to 8x daily if necessary  Patient not taking: No sig reported   Sherrlyn Hock, MD Not Taking Active   atorvastatin (LIPITOR) 10 MG tablet [id number] No Take 1 tablet (10 mg total) by mouth daily. Masters, Scientist, research (physical sciences), DO Taking Expired 12/28/20 2359   Continuous Blood Gluc Receiver (DEXCOM G6 RECEIVER) DEVI  [id number] No 1 Device by Does not apply route as directed. Sherrlyn Hock, MD Taking Active   Continuous Blood Gluc Sensor (DEXCOM G6 SENSOR) MISC [id number] No Inject 1 applicator into the skin as directed. (change sensor every 10 days) Al Corpus, MD Taking Active   Continuous Blood Gluc Transmit (DEXCOM G6 TRANSMITTER) MISC [id number] No Inject 1 Device into the skin as directed. (re-use up to 8x with each new sensor) Al Corpus, MD Taking Active   Dulaglutide (TRULICITY [place]) [id number] No Inject into the skin once a week. [provider] Taking Active   gabapentin (NEURONTIN) 300 MG capsule [id number] No Take 1 capsule (300 mg total) by mouth 2 (two) times daily.  Patient not taking: Reported on 12/28/2020   Lacinda Axon, MD Not Taking Active   GAMUNEX-C 20 GM/200ML SOLN [id number]   [provider]  Active   [facility] 5 GM/50ML SOLN [id number]   [provider]  Active   Glucose Blood (BLOOD GLUCOSE TEST STRIPS) STRP [id number] No 1 Device by Other route as directed. To check blood sugar up to 8x per day  Patient not taking: Reported on 12/20/2020   Sherrlyn Hock, MD Not Taking Active   insulin degludec (TRESIBA FLEXTOUCH) 100 UNIT/ML FlexTouch Pen [id number] No Inject into the skin daily. [provider] Taking Active            Med Note ([place] A   Mon Dec 20, 2020  2:23 PM) Taking until it runs out  insulin glargine (LANTUS SOLOSTAR) 100 UNIT/ML Solostar Pen [id number] No Inject 25 Units into the  skin at bedtime. Please start using this medication once you have run out of [place].  Patient not taking: Reported on 12/20/2020   Lacinda Axon, MD Not Taking Active            Med Note (Trannie Bardales A   Mon Dec 20, 2020  2:22 PM) Taking tresiba until he runs out of it  insulin lispro (HUMALOG KWIKPEN) 200 UNIT/ML KwikPen [id number] No Inject up to 100 units daily per provider instructions Al Corpus, MD Taking Active   Insulin  Pen Needle (BD PEN NEEDLE NANO U/F) 32G X 4 MM MISC [id number] No Inject up to 8 times per day. Marianna Payment, MD Taking Active   Lancets Misc. (ACCU-CHEK FASTCLIX LANCET) KIT [id number] No 1 Device by Does not apply route as directed. Use with lancets to check blood sugar up to 8x daily.  (diagnosis code E10.65)  Patient not taking: No sig reported   Sherrlyn Hock, MD Not Taking Active   PRESCRIPTION MEDICATION [id number] No Inject into the vein. IVIG every 4 weeks. Order for new start sent to [facility] on 04/08/2020. Melvenia Beam, MD Taking Active             Patient Active Problem List   Diagnosis Date Noted   Proteinuria 11/23/2020   CIDP (chronic inflammatory demyelinating polyneuropathy) ([place]) 05/25/2020   Noncompliance with medication treatment due to difficulty with dosing 05/25/2020   Mixed hyperlipidemia 05/25/2020   Depression 05/25/2020   Diabetic polyneuropathy associated with type 1 diabetes mellitus ([place]) 02/26/2019   Postprandial bloating 02/25/2019   Sensory problems with limbs 02/25/2019   Uncontrolled type 1 diabetes mellitus with hyperglycemia ([place]) 05/24/2017   Morbid obesity ([place]) 05/24/2017   Acanthosis 05/24/2017    Conditions to be addressed/monitored per PCP order:   [name]  Care Plan : RN Care Manager Plan of Care  Updates made by Melissa Montane, RN since 01/17/2021 12:00 AM     Problem: Knowledge Deficits and Care Coordination related to managing DM   Priority: High     Long-Range Goal: Development of plan of care to address needs and knowledge deficits related to [name]   Start Date: 01/03/2021  Expected End Date: 04/03/2021  Priority: High  Note:   Current Barriers:  Chronic Disease Management support and education needs related to [name]  RNCM spoke with patient's DPR/grandmother today. Grandmother was not aware of patient missing PCP appointment on 11/22. She reports patient is doing better, but unsure if he is wearing CGM as  directed. Request RNCM to call patient at another time.  RNCM Clinical Goal(s):  Patient will verbalize understanding of plan for management of [name] as evidenced by patient's verbalization of and self management activities take all medications exactly as prescribed and will call provider for medication related questions as evidenced by documentation in EMR and improving lab values    attend all scheduled medical appointments: reschedule missed appointment with PCP and 12/7 with Neurology as evidenced by physician documentation in EMR        demonstrate improved adherence to prescribed treatment plan for [name] as evidenced by taking all medications as directed, eating a diabetic diet and attending appointments with PCP and specialist  through collaboration with RN Care manager, provider, and care team.   Interventions: Inter-disciplinary care team collaboration (see longitudinal plan of care) Evaluation of current treatment plan related to  self management and patient's adherence to plan as established by provider   Diabetes:  (Status: Goal  on track: NO.) Long Term Goal   Lab Results  Component Value Date   HGBA1C 13.3 (A) 09/27/2020  Assessed patient's understanding of A1c goal: <7% Provided education to patient about basic DM disease process; Discussed plans with patient for ongoing care management follow up and provided patient with direct contact information for care management team;      Reviewed scheduled/upcoming provider appointments including: 12/7 at 3pm with Neurology and calling to reschedule missed PCP appointment and Eye Exam;         Review of patient status, including review of consultants reports, relevant laboratory and other test results, and medications completed;         Patient Goals/Self-Care Activities: Take medications as prescribed   Attend all scheduled provider appointments Call pharmacy for medication refills 3-7 days in advance of running out of medications Call  provider office for new concerns or questions  schedule appointment with eye doctor take the blood sugar meter to all doctor visits drink 6 to 8 glasses of water each day fill half of plate with vegetables keep a food diary manage portion size       Follow Up:  Patient agrees to Care Plan and Follow-up.  Plan: The Managed Medicaid care management team will reach out to the patient again over the next 7 days.  Date/time of next scheduled RN care management/care coordination outreach:  01/20/21 @ 4pm  Lurena Joiner RN, BSN [place]  [employer] manager

## 2021-01-19 ENCOUNTER — Ambulatory Visit (INDEPENDENT_AMBULATORY_CARE_PROVIDER_SITE_OTHER): Payer: Medicaid Other | Admitting: Neurology

## 2021-01-19 ENCOUNTER — Other Ambulatory Visit: Payer: Self-pay

## 2021-01-19 ENCOUNTER — Encounter: Payer: Self-pay | Admitting: Neurology

## 2021-01-19 VITALS — BP 134/83 | HR 101 | Ht 73.0 in

## 2021-01-19 DIAGNOSIS — G6181 Chronic inflammatory demyelinating polyneuritis: Secondary | ICD-10-CM

## 2021-01-20 ENCOUNTER — Other Ambulatory Visit: Payer: Self-pay | Admitting: *Deleted

## 2021-01-20 ENCOUNTER — Other Ambulatory Visit: Payer: Self-pay

## 2021-01-20 NOTE — Patient Instructions (Signed)
Visit Information  Mr. Citro was given information about Medicaid Managed Care team care coordination services as a part of their [facility] Medicaid benefit. Authur Cubit verbally consented to engagement with the [facility] Managed Care team.   If you are experiencing a medical emergency, please call 911 or report to your local emergency department or urgent care.   If you have a non-emergency medical problem during routine business hours, please contact your provider's office and ask to speak with a nurse.   For questions related to your [facility] health plan, please call: [phone] or go here:[email or web address]  If you would like to schedule transportation through your [facility] plan, please call the following number at least 2 days in advance of your appointment: [phone].  Call the [facility] Crisis Line at [phone], at any time, 24 hours a day, 7 days a week. If you are in danger or need immediate medical attention call 911.  If you would like help to quit smoking, call 1-800-QUIT-NOW (973-876-1378) OR Espaol: 1-855-Djelo-Ya (8-016-553-7482) o para ms informacin haga clic aqu or Text READY to 707-867 to register via text    Please see education materials related to diabetes provided as print materials.   The patient verbalized understanding of instructions provided today and agreed to receive a mailed copy of patient instruction and/or educational materials.  Telephone follow up appointment with Managed Medicaid care management team member scheduled for:02/03/21 @ 3:30pm  [name] RN, BSN [place]  [facility] RN Care Coordinator   Following is a copy of your plan of care:  Care Plan : RN Care Manager Plan of Care  Updates made by Heidi Dach, RN since 01/20/2021 12:00 AM     Problem: Knowledge Deficits and Care Coordination related to managing DM   Priority: High     Long-Range Goal: Development of  plan of care to address needs and knowledge deficits related to [name]   Start Date: 01/03/2021  Expected End Date: 04/03/2021  Priority: High  Note:   Current Barriers:  Chronic Disease Management support and education needs related to [name]  Verna reports wearing CGM. BS at this time 220. Patient reports taking insulin as directed. Breakfast this morning at 7am and has not eaten again today. Patient needs to reschedule missed Endocrinology and PCP appointments. He did attend Neurology visit on 12/7 and will be restarting PT.  RNCM Clinical Goal(s):  Patient will verbalize understanding of plan for management of [name] as evidenced by patient's verbalization of and self management activities take all medications exactly as prescribed and will call provider for medication related questions as evidenced by documentation in EMR and improving lab values    attend all scheduled medical appointments: reschedule missed appointment with PCP and 12/7 with Neurology as evidenced by physician documentation in EMR        demonstrate improved adherence to prescribed treatment plan for [name] as evidenced by taking all medications as directed, eating a diabetic diet and attending appointments with PCP and specialist  through collaboration with RN Care manager, provider, and care team.   Interventions: Inter-disciplinary care team collaboration (see longitudinal plan of care) Evaluation of current treatment plan related to  self management and patient's adherence to plan as established by provider   Diabetes:  (Status: Goal on Track (progressing): YES.) Long Term Goal   Lab Results  Component Value Date   HGBA1C 13.3 (A) 09/27/2020  Assessed patient's understanding of A1c goal: <7% Reviewed medications with patient and discussed  importance of medication adherence;        Reviewed prescribed diet with patient encouraged to eat 3 meals plus snacks, increasing fruit and vegetables and cutting back on bread, pasta,  potatoes and sugary sweets; Discussed plans with patient for ongoing care management follow up and provided patient with direct contact information for care management team;      Provided patient with written educational materials related to hypo and hyperglycemia and importance of correct treatment;       Reviewed scheduled/upcoming provider appointments including:  calling to reschedule missed appointment with PCP, Endocrinology and Eye Exam;         call provider for findings outside established parameters;       Review of patient status, including review of consultants reports, relevant laboratory and other test results, and medications completed;       Provided information to [facility], [address] Johny Shears [place], [place] [address], [phone]   Patient Goals/Self-Care Activities: Take medications as prescribed   Attend all scheduled provider appointments Call pharmacy for medication refills 3-7 days in advance of running out of medications Call provider office for new concerns or questions  schedule appointment with eye doctor take the blood sugar meter to all doctor visits drink 6 to 8 glasses of water each day fill half of plate with vegetables keep a food diary manage portion size

## 2021-01-20 NOTE — Patient Outreach (Signed)
Medicaid Managed Care   Nurse Care Manager Note  01/20/2021 Name:  Lucas Cunningham MRN:  [id number] DOB:  07-18-2000  Lucas Cunningham is an 20 y.o. year old male who is [place] primary patient of Amponsah, Charisse March, MD.  The [facility] Managed Care Coordination team was consulted for assistance with:    [name]  [name] was given information about Medicaid Managed Care Coordination team services today. Lucas Cunningham Patient agreed to services and verbal consent obtained.  Engaged with patient by telephone for follow up visit in response to provider referral for case management and/or care coordination services.   Assessments/Interventions:  Review of past medical history, allergies, medications, health status, including review of consultants reports, laboratory and other test data, was performed as part of comprehensive evaluation and provision of chronic care management services.  SDOH (Social Determinants of Health) assessments and interventions performed: SDOH Interventions    Flowsheet Row Most Recent Value  SDOH Interventions   Transportation Interventions Other (Comment)  [Medical transportation provided by [employer] [phone]]       Care Plan  No Known Allergies  Medications Reviewed Today     Reviewed by Lucas Montane, RN (Registered Nurse) on 01/20/21 at [place] List Status: <None>   Medication Order Taking? Sig Documenting Provider Last Dose Status Informant  Accu-Chek FastClix Lancets MISC [id number] No 1 Device by Does not apply route as directed. Use with Fastclix lancet device to check blood sugar up to 8x daily. (diagnosis code E10.65)  Patient not taking: Reported on 12/20/2020   Lucas Hock, MD Not Taking Active   acetone, urine, test strip [id number] No 1 strip by Does not apply route as needed for high blood sugar. Up to 8x daily if necessary  Patient not taking: Reported on 08/10/2020   Lucas Hock, MD Not Taking Active   atorvastatin (LIPITOR) 10 MG  tablet [id number] Yes Take 1 tablet (10 mg total) by mouth daily. Lucas Cunningham Taking Active   Continuous Blood Gluc Receiver (DEXCOM G6 RECEIVER) DEVI [id number] Yes 1 Device by Does not apply route as directed. Lucas Hock, MD Taking Active   Continuous Blood Gluc Sensor (DEXCOM G6 SENSOR) MISC [id number] Yes Inject 1 applicator into the skin as directed. (change sensor every 10 days) Lucas Corpus, MD Taking Active   Continuous Blood Gluc Transmit (DEXCOM G6 TRANSMITTER) MISC [id number] Yes Inject 1 Device into the skin as directed. (re-use up to 8x with each new sensor) Lucas Corpus, MD Taking Active   Dulaglutide [facility]) [id number] Yes Inject into the skin once Cunningham week. [provider] Taking Active   gabapentin (NEURONTIN) 300 MG capsule [id number] No Take 1 capsule (300 mg total) by mouth 2 (two) times daily.  Patient not taking: Reported on 12/28/2020   Lucas Axon, MD Not Taking Active   GAMUNEX-C 20 GM/200ML [place]ch [id number] Yes  [provider] Taking Active   GAMUNEX-C 5 GM/50ML [place] Mech [id number] Yes  [provider] Taking Active   Glucose Blood (BLOOD GLUCOSE TEST STRIPS) STRP [id number]  1 Device by Other route as directed. To check blood sugar up to 8x per day  Patient not taking: Reported on 12/20/2020   Lucas Hock, MD  Active   insulin degludec [facility]) 100 UNIT/ML FlexTouch Pen [id number] Yes Inject into the skin daily. [provider] Taking Active            Med Note Lucas Cunningham, Lucas Cunningham   Mon Dec 20, 2020  2:23 PM) Taking until it runs out  insulin glargine (LANTUS SOLOSTAR) 100 UNIT/ML Solostar Pen [id number] No Inject 25 Units into the skin at bedtime. Please start using this medication once you have run out of [place].  Patient not taking: Reported on 12/20/2020   Lucas Axon, MD Not Taking Active            Med Note (Lucas Cunningham   Mon Dec 20, 2020  2:22 PM) Taking tresiba until he runs out of it   insulin lispro (HUMALOG KWIKPEN) 200 UNIT/ML KwikPen [id number] Yes Inject up to 100 units daily per provider instructions Lucas Corpus, MD Taking Active   Insulin Pen Needle (BD PEN NEEDLE NANO U/F) 32G X 4 MM MISC [id number] Yes Inject up to 8 times per day. Lucas Payment, MD Taking Active   Lancets Misc. (ACCU-CHEK FASTCLIX LANCET) KIT [id number] No 1 Device by Does not apply route as directed. Use with lancets to check blood sugar up to 8x daily.  (diagnosis code E10.65)  Patient not taking: Reported on 12/20/2020   Lucas Hock, MD Not Taking Active   PRESCRIPTION MEDICATION [id number] Yes Inject into the vein. IVIG every 4 weeks. Order for new start sent to [place] on 04/08/2020. 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   CIDP (chronic inflammatory demyelinating polyneuropathy) ([place]) 05/25/2020   Noncompliance with medication treatment due to difficulty with dosing 05/25/2020   Mixed hyperlipidemia 05/25/2020   Depression 05/25/2020   Diabetic polyneuropathy associated with type 1 diabetes mellitus ([place]) 02/26/2019   Postprandial bloating 02/25/2019   Sensory problems with limbs 02/25/2019   Uncontrolled type 1 diabetes mellitus with hyperglycemia ([place]) 05/24/2017   Morbid obesity ([place]) 05/24/2017   Acanthosis 05/24/2017    Conditions to be addressed/monitored per PCP order:   [name]  Care Plan : RN Care Manager Plan of Care  Updates made by Lucas Montane, RN since 01/20/2021 12:00 AM     Problem: Knowledge Deficits and Care Coordination related to managing DM   Priority: High     Long-Range Goal: Development of plan of care to address needs and knowledge deficits related to [name]   Start Date: 01/03/2021  Expected End Date: 04/03/2021  Priority: High  Note:   Current Barriers:  Chronic Disease Management support and education needs related to [place] reports wearing  CGM. BS at this time 220. Patient reports taking insulin as directed. Breakfast this morning at 7am and has not eaten again today. Patient needs to reschedule missed Endocrinology and PCP appointments. He did attend Neurology visit on 12/7 and will be restarting PT.  RNCM Clinical Goal(s):  Patient will verbalize understanding of plan for management of [name] as evidenced by patient's verbalization of and self management activities take all medications exactly as prescribed and will call provider for medication related questions as evidenced by documentation in EMR and improving lab values    attend all scheduled medical appointments: reschedule missed appointment with PCP and 12/7 with Neurology as evidenced by physician documentation in EMR        demonstrate improved adherence to prescribed treatment plan for [name] as evidenced by taking all medications as directed, eating Cunningham diabetic diet and attending appointments with PCP and specialist  through collaboration with RN Care manager, provider, and care team.   Interventions: Inter-disciplinary care team collaboration (  see longitudinal plan of care) Evaluation of current treatment plan related to  self management and patient's adherence to plan as established by provider   Diabetes:  (Status: Goal on Track (progressing): YES.) Long Term Goal   Lab Results  Component Value Date   HGBA1C 13.3 ([place]) 09/27/2020  Assessed patient's understanding of A1c goal: <7% Reviewed medications with patient and discussed importance of medication adherence;        Reviewed prescribed diet with patient encouraged to eat 3 meals plus snacks, increasing fruit and vegetables and cutting back on bread, pasta, potatoes and sugary sweets; Discussed plans with patient for ongoing care management follow up and provided patient with direct contact information for care management team;      Provided patient with written educational materials related to hypo and hyperglycemia and  importance of correct treatment;       Reviewed scheduled/upcoming provider appointments including:  calling to reschedule missed appointment with PCP, Endocrinology and Eye Exam;         call provider for findings outside established parameters;       Review of patient status, including review of consultants reports, relevant laboratory and other test results, and medications completed;       Provided information to [facility] Endocrinology, [address] [place], [place] [address], [phone]   Patient Goals/Self-Care Activities: Take medications as prescribed   Attend all scheduled provider appointments Call pharmacy for medication refills 3-7 days in advance of running out of medications Call provider office for new concerns or questions  schedule appointment with eye doctor take the blood sugar meter to all doctor visits drink 6 to 8 glasses of water each day fill half of plate with vegetables keep Cunningham food diary manage portion size       Follow Up:  Patient agrees to Care Plan and Follow-up.  Plan: The Managed Medicaid care management team will reach out to the patient again over the next 14 days.  Date/time of next scheduled RN care management/care coordination outreach:  02/03/21 @ 3:30pm   Lurena Joiner RN, BSN [place] RN Care Coordinator

## 2021-01-23 NOTE — Progress Notes (Signed)
[facility]    Provider:  Dr Jaynee Eagles Requesting Provider: Lacinda Axon, MD Primary Care Provider:  Lacinda Axon, MD  CC:  Foot drop, right arm wrist drop  Interval history 01/23/2021: Has only had 2 IVIG. Is feeling better. Power out so appointment performed offline. He is still using his walker. He has pfos. It is time for repeat PT now that he has IVIG, will order and discussed we hopefully can get him walking with a cane an then hopefully without any walking aids. He ay always have some weakness and sensory changes distally, will need OT for fine motor.   Reviewed images (additional 20 minutes in addition to 20 minute appointment) MRI c-spine and L-spine unremarkable  MRI brain: IMPRESSION 03/15/2020: This MRI of the brain with and without contrast shows the following: 1.   The brain and upper cervical spinal cord appear normal. 2.   Bilateral mastoid effusions likely due to eustachian tube dysfunction. 3.   Mild right maxillary chronic sinusitis 4.   Normal enhancement pattern and no acute findings.  Interval history March 11, 2020: Patient is here with his grandmother for evaluation.  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  I gave them literature to read.  HPI:  Lucas Cunningham is a 20 y.o. male here as requested by Lacinda Axon, MD for diabetic neuropathy.  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  I reviewed his notes from pediatrics back through 2002 and they are significant for: Multiple admissions for uncontrolled diabetes type 1 and DKA, diabetic neuropathy with leg weakness, sensory problems with the limbs, on gabapentin, last saw neurology in June 2021 and was referred to adult neurologist to manage him, now walks with a walker, mother died in 05-Nov-2019, he lives with his grandmother.  I reviewed epic and "care everywhere", I did not see any notes from neurology.  Examination from 2021 October showed obesity, standing with the support of a walker, he can get up from chairs, however alert and bright, normal head face eyes ears, neck visibly normal but does have a thyroid enlargement, lungs are clear to auscultation, heart rates normal without murmurs, abdomen normal, arms normal, no obvious tremor, leg muscles appear normal for age, feet are normally formed, dorsalis pedis pulses 1+, strength was difficult to assess overall, sensation to touch normal in the legs but decreased in both feet.  He has seen child neurology in the past. Just a few months ago. He can't remember the name. Poor historian. Unclear if he even saw neurology. He is here because his legs are very weak, sensory changes in the legs, been ongoing for about 2 years, when it first started it was "bad", started acutely he says he couldn't stand up. Then he improved. He couldn't step on  a curve back then but now he can. Happened in both legs,symmetrical,feels "weird" to touch his legs.  Symptoms started in the right hand a few months ago, his wrist became weak and he can;t lift his hand, after hitting the house, he has numbness in the right hand, tingly, more in digits 1-2 of the right hand. No back pain, no neck pain. Using a walker  for since 2020. Sometimes painful in the right hand but not in the legs or feet. Legs are stable. The right   Reviewed notes, labs and imaging from outside physicians, which showed:   10/25/2019: hgba1c 14.4  Review of Systems: Patient complains of symptoms per HPI as well as the following symptoms: CIDP. Pertinent negatives and positives per HPI. All others negative.   Social History   Socioeconomic History   Marital status: Single    Spouse name: Not on file   Number of children: Not on file   Years of education: Not on file   Highest education level: Not on file  Occupational History   Not on file  Tobacco Use   Smoking status: Never    Passive exposure: Yes   Smokeless tobacco: Never  Substance and Sexual Activity   Alcohol use: Not Currently   Drug use: Not Currently   Sexual activity: Not Currently  Other Topics Concern   Not on file  Social History Narrative   Lives with his grandmother. He graduated HS 2021   Right handed   Caffeine: 2 cups/day   Social Determinants of Health   Financial Resource Strain: Not on file  Food Insecurity: Food Insecurity Present   Worried About [place] in the Last Year: Sometimes true   Ran Out of Food in the Last Year: Sometimes true  Transportation Needs: Unmet Transportation Needs   Lack of Transportation (Medical): Yes   Lack of Transportation (Non-Medical): No  Physical Activity: Not on file  Stress: Not on file  Social Connections: Socially Isolated   Frequency of Communication with Friends and Family: More than three times a week   Frequency of Social Gatherings with Friends and Family: Never   Attends Religious Services: Never   Marine scientist or Organizations: No   Attends Music therapist: Never   Marital Status: Never married  Human resources officer Violence: Not on file    Family History  Problem Relation Age of Onset   Heart disease Mother    Hypertension Mother    Diabetes Maternal  Grandmother    Diabetes Maternal Grandfather    Diabetes Paternal Grandmother    Migraines Neg Hx    Seizures Neg Hx    Autism Neg Hx    ADD / ADHD Neg Hx    Anxiety disorder Neg Hx    Depression Neg Hx    Bipolar disorder Neg Hx    Schizophrenia Neg Hx     Past Medical History:  Diagnosis Date   CIDP (chronic inflammatory demyelinating polyneuropathy) ([place])    Diabetes mellitus without complication ([place])    type 1    Patient Active Problem List   Diagnosis Date Noted   Proteinuria 11/23/2020   CIDP (chronic inflammatory demyelinating polyneuropathy) ([place]) 05/25/2020   Noncompliance with medication treatment due to difficulty with dosing 05/25/2020   Mixed hyperlipidemia 05/25/2020   Depression 05/25/2020   Diabetic polyneuropathy associated with type 1 diabetes mellitus ([place]) 02/26/2019   Postprandial bloating 02/25/2019   Sensory problems with limbs 02/25/2019   Uncontrolled type  1 diabetes mellitus with hyperglycemia ([place]) 05/24/2017   Morbid obesity ([place]) 05/24/2017   Acanthosis 05/24/2017    Past Surgical History:  Procedure Laterality Date   NO PAST SURGERIES      Current Outpatient Medications  Medication Sig Dispense Refill   Accu-Chek FastClix Lancets MISC 1 Device by Does not apply route as directed. Use with Fastclix lancet device to check blood sugar up to 8x daily. (diagnosis code E10.65) (Patient not taking: Reported on 12/20/2020) 300 each 11   acetone, urine, test strip 1 strip by Does not apply route as needed for high blood sugar. Up to 8x daily if necessary (Patient not taking: Reported on 08/10/2020) 360 strip 11   atorvastatin (LIPITOR) 10 MG tablet Take 1 tablet (10 mg total) by mouth daily. 90 tablet 0   Continuous Blood Gluc Receiver (DEXCOM G6 RECEIVER) DEVI 1 Device by Does not apply route as directed. 1 each 2   Continuous Blood Gluc Sensor (DEXCOM G6 SENSOR) MISC Inject 1 applicator into the skin as directed. (change sensor every 10 days) 3 each  11   Continuous Blood Gluc Transmit (DEXCOM G6 TRANSMITTER) MISC Inject 1 Device into the skin as directed. (re-use up to 8x with each new sensor) 1 each 3   Dulaglutide (TRULICITY [place]) Inject into the skin once a week.     gabapentin (NEURONTIN) 300 MG capsule Take 1 capsule (300 mg total) by mouth 2 (two) times daily. (Patient not taking: Reported on 12/28/2020) 180 capsule 1   GAMUNEX-C 20 GM/200ML SOLN      GAMUNEX-C 5 GM/50ML SOLN      Glucose Blood (BLOOD GLUCOSE TEST STRIPS) STRP 1 Device by Other route as directed. To check blood sugar up to 8x per day (Patient not taking: Reported on 12/20/2020) 300 strip 11   insulin degludec (TRESIBA FLEXTOUCH) 100 UNIT/ML FlexTouch Pen Inject into the skin daily.     insulin glargine (LANTUS SOLOSTAR) 100 UNIT/ML Solostar Pen Inject 25 Units into the skin at bedtime. Please start using this medication once you have run out of [place]. (Patient not taking: Reported on 12/20/2020) 7.5 mL 2   insulin lispro (HUMALOG KWIKPEN) 200 UNIT/ML KwikPen Inject up to 100 units daily per provider instructions 30 mL 11   Insulin Pen Needle (BD PEN NEEDLE NANO U/F) 32G X 4 MM MISC Inject up to 8 times per day. 250 each 6   Lancets Misc. (ACCU-CHEK FASTCLIX LANCET) KIT 1 Device by Does not apply route as directed. Use with lancets to check blood sugar up to 8x daily.  (diagnosis code E10.65) (Patient not taking: Reported on 12/20/2020) 1 kit 3   PRESCRIPTION MEDICATION Inject into the vein. IVIG every 4 weeks. Order for new start sent to [facility] on 04/08/2020.     No current facility-administered medications for this visit.    Allergies as of 01/19/2021   (No Known Allergies)    Vitals: BP 134/83 (BP Location: Right Arm, Patient Position: Sitting, Cuff Size: Large)   Pulse (!) 101   Ht 6' 1" (1.854 m)   BMI 38.06 kg/m  Last Weight:  Wt Readings from Last 1 Encounters:  11/22/20 288 lb 8 oz (130.9 kg)   Last Height:   Ht Readings from Last 1  Encounters:  01/19/21 6' 1" (1.854 m)     Physical exam: Exam: Gen: NAD, conversant, well nourised, obese, well groomed  CV: RRR, no MRG. No Carotid Bruits. No peripheral edema, warm, nontender Eyes: Conjunctivae clear without exudates or hemorrhage  Neuro: Detailed Neurologic Exam  Speech:    Speech is normal; fluent and spontaneous with normal comprehension.  Cognition:    The patient is oriented to person, place, and time;     recent and remote memory intact;     language fluent;     normal attention, concentration,     fund of knowledge Cranial Nerves:    The pupils are equal, round, and reactive to light. Attempted fundoscopy could not visualize.  Visual fields are full to finger confrontation. Extraocular movements are intact. Trigeminal sensation is intact and the muscles of mastication are normal. The face is symmetric. The palate elevates in the midline. Hearing intact. Voice is normal. Shoulder shrug is normal. The tongue has normal motion without fasciculations.   Coordination:    No dysmetria or ataxia  Gait:    Steppage gait  Motor Observation:    No asymmetry, no atrophy, and no involuntary movements noted. Tone:    Normal muscle tone.    Posture:    Posture is normal. normal erect    Strength: deltoid intact strength, biceps 4/5, triceps 3+-4/5, right wrist drop, Hip flexion 4+/5. Leg flex.extension 5/5. 0/5 DF/PF feet.      Sensation: intact to LT, pinprick, temp, absent vibration distally in the feet at great toes.      Reflex Exam:  DTR's: absent lowers, trace biceps and triceps, 1+ brachiradialis.  Toes:    The toes are equiv bilaterally.   Clonus:    Clonus is absent.    Assessment/Plan:   This is a 20 year old male with uncontrolled type 1 diabetes who is here for diabetic polyneuropathy.  Although diabetes may be a factor (hemoglobin A1c greater than 14) he has a very interesting presentation with bilateral distal foot drop  and now right wrist drop and weakness of the right arm.  His sensation distally is good to pinprick and temperature but absent to vibration.  his mother passed away recently and is here with his grandmother who does not have much to add unfortunately.   IVIG x 2. Needs at least 3 to see improvement but he does feel improved already. Needs PT/OT MRI lumbar spine, cervical spine and brain all unremarkable, no other etiology CIDP c/w emg/ncs Consider Lumbar Puncture after workup above AFOs for bilateral foot drop Patient would benefit from diabetic shoes to prevent injury due to diabetic foot disease  Orders Placed This Encounter  Procedures   Ambulatory referral to Physical Therapy   Ambulatory referral to Occupational Therapy    No orders of the defined types were placed in this encounter.   Cc: Lacinda Axon, MD,  Lacinda Axon, MD  Sarina Ill, MD  [facility] [address] [place], [place] [address]  Phone [phone] Fax [phone]  I spent over over 40 minutes of face-to-face and non-face-to-face time with patient on the  1. CIDP (chronic inflammatory demyelinating polyneuropathy) (HCC)     diagnosis.  This included previsit chart review, lab review, study review, order entry, electronic health record documentation, patient education on the different diagnostic and therapeutic options, counseling and coordination of care, risks and benefits of management, compliance, or risk factor reduction

## 2021-01-25 ENCOUNTER — Ambulatory Visit: Payer: Self-pay

## 2021-01-28 ENCOUNTER — Telehealth: Payer: Self-pay | Admitting: Pharmacist

## 2021-01-28 ENCOUNTER — Ambulatory Visit: Payer: Self-pay

## 2021-01-28 NOTE — Patient Outreach (Signed)
01/28/2021 Name: Elmo Rio MRN: [id number] DOB: 2000-05-14  Referred by: Steffanie Rainwater, MD Reason for referral : No chief complaint on file.   An unsuccessful telephone outreach was attempted today. The patient was referred to the case management team for assistance with care management and care coordination.    Follow Up Plan: The Managed Medicaid care management team will reach out to the patient again over the next 10 days.   Cheral Almas PharmD, CPP High Risk Managed Medicaid [place] [phone]

## 2021-02-03 ENCOUNTER — Other Ambulatory Visit: Payer: Self-pay | Admitting: *Deleted

## 2021-02-03 ENCOUNTER — Ambulatory Visit: Payer: Medicaid Other

## 2021-02-03 ENCOUNTER — Other Ambulatory Visit: Payer: Self-pay

## 2021-02-03 NOTE — Patient Instructions (Signed)
Visit Information  [name] was given information about Medicaid Managed Care team care coordination services as a part of their [facility] Medicaid benefit. Lucas Cunningham verbally consented to engagement with the [facility] Managed Care team.   If you are experiencing a medical emergency, please call 911 or report to your local emergency department or urgent care.   If you have a non-emergency medical problem during routine business hours, please contact your provider's office and ask to speak with a nurse.   For questions related to your [facility] health plan, please call: [phone] or go here:[email or web address]  If you would like to schedule transportation through your [facility] plan, please call the following number at least 2 days in advance of your appointment: [phone].  Call the [facility] Crisis Line at [phone], at any time, 24 hours a day, 7 days a week. If you are in danger or need immediate medical attention call 911.  If you would like help to quit smoking, call 1-800-QUIT-NOW (907-256-4925) OR Espaol: 1-855-Djelo-Ya (8-127-517-0017) o para ms informacin haga clic aqu or Text READY to 494-496 to register via text  Lucas Cunningham - following are the goals we discussed in your visit today:   Goals Addressed   None     Please see education materials related to Diabetes and fall prevention provided as print materials.   The patient verbalized understanding of instructions provided today and agreed to receive a mailed copy of patient instruction and/or educational materials.  Telephone follow up appointment with Managed Medicaid care management team member scheduled for:02/18/21 @ 2:30pm  Lucas Emms RN, BSN [place]   [facility] RN Care Coordinator   Following is a copy of your plan of care:  Care Plan : RN Care Manager Plan of Care  Updates made by Lucas Dach, RN since 02/03/2021 12:00 AM     Problem:  Knowledge Deficits and Care Coordination related to managing DM   Priority: High     Long-Range Goal: Development of plan of care to address needs and knowledge deficits related to [name]   Start Date: 01/03/2021  Expected End Date: 04/03/2021  Priority: High  Note:   Current Barriers:  Chronic Disease Management support and education needs related to [name]  Lucas Cunningham reports wearing CGM. BS at this time 220, he has not eaten today. Patient reports taking insulin as directed. . Patient needs to reschedule missed Endocrinology and PCP appointments. He did attend Neurology visit on 12/7 and needs to schedule appointments with PT/OT.  RNCM Clinical Goal(s):  Patient will verbalize understanding of plan for management of [name] as evidenced by patient's verbalization of and self management activities take all medications exactly as prescribed and will call provider for medication related questions as evidenced by documentation in EMR and improving lab values    attend all scheduled medical appointments: reschedule missed appointment with PCP and 12/7 with Neurology as evidenced by physician documentation in EMR        demonstrate improved adherence to prescribed treatment plan for [name] as evidenced by taking all medications as directed, eating a diabetic diet and attending appointments with PCP and specialist  through collaboration with RN Care manager, provider, and care team.   Interventions: Inter-disciplinary care team collaboration (see longitudinal plan of care) Evaluation of current treatment plan related to  self management and patient's adherence to plan as established by provider Discussed the importance of Lucas Cunningham attempting to schedule his appointments, not waiting for his grandmother to do so  Explained to Lucas Cunningham where to find the information for medical transportation provided by [facility] on AVS/visit information that was sent in the mail, Lucas Cunningham was able to find the number and recite back to [facility]   Encouraged patient to contact [facility] [phone] for member benefits(telephone and rewards)   Diabetes:  (Status: Goal on Track (progressing): YES.) Long Term Goal   Lab Results  Component Value Date   HGBA1C 13.3 (A) 09/27/2020  Assessed patient's understanding of A1c goal: <7% Reviewed medications with patient and discussed importance of medication adherence;        Reviewed prescribed diet with patient encouraged to eat 3 meals plus snacks, increasing fruit and vegetables and cutting back on bread, pasta, potatoes and sugary sweets; Discussed plans with patient for ongoing care management follow up and provided patient with direct contact information for care management team;      Provided patient with written educational materials related to hypo and hyperglycemia and importance of correct treatment;       Reviewed scheduled/upcoming provider appointments including:  calling to reschedule missed appointment with PCP, Endocrinology, PT/OT and Eye Exam;         call provider for findings outside established parameters;       Review of patient status, including review of consultants reports, relevant laboratory and other test results, and medications completed;       Provided information to [facility], [address] [place], [place] [address], [phone]   Patient Goals/Self-Care Activities: Take medications as prescribed   Attend all scheduled provider appointments Call pharmacy for medication refills 3-7 days in advance of running out of medications Call provider office for new concerns or questions  schedule appointment with eye doctor take the blood sugar meter to all doctor visits drink 6 to 8 glasses of water each day fill half of plate with vegetables keep a food diary manage portion size

## 2021-02-03 NOTE — Patient Outreach (Addendum)
Medicaid Managed Care   Nurse Care Manager Note  02/03/2021 Name:  Lucas Cunningham MRN:  [id number] DOB:  06-17-2000  Lucas Cunningham is an 20 y.o. year old male who is a primary patient of [place], Flossie Buffy, MD.  The [facility] Managed Care Coordination team was consulted for assistance with:    [name]  Lucas Cunningham was given information about Medicaid Managed Care Coordination team services today. Lucas Cunningham Patient agreed to services and verbal consent obtained.  Engaged with patient by telephone for follow up visit in response to provider referral for case management and/or care coordination services.   Assessments/Interventions:  Review of past medical history, allergies, medications, health status, including review of consultants reports, laboratory and other test data, was performed as part of comprehensive evaluation and provision of chronic care management services.  SDOH (Social Determinants of Health) assessments and interventions performed: SDOH Interventions    Flowsheet Row Most Recent Value  SDOH Interventions   Transportation Interventions Other (Comment)  [Provided patient with transportation provided by [facility] [phone]]       Care Plan  No Known Allergies  Medications Reviewed Today     Reviewed by Heidi Dach, RN (Registered Nurse) on 02/03/21 at 1425  Med List Status: <None>   Medication Order Taking? Sig Documenting Provider Last Dose Status Informant  atorvastatin (LIPITOR) 10 MG tablet [id number] Yes Take 1 tablet (10 mg total) by mouth daily. Masters, Psychiatric nurse, DO Taking Active   Continuous Blood Gluc Receiver (DEXCOM G6 RECEIVER) DEVI [id number] Yes 1 Device by Does not apply route as directed. David Stall, MD Taking Active   Continuous Blood Gluc Sensor (DEXCOM G6 SENSOR) MISC [id number] Yes Inject 1 applicator into the skin as directed. (change sensor every 10 days) Silvana Newness, MD Taking Active   Continuous Blood Gluc Transmit (DEXCOM G6  TRANSMITTER) MISC [id number] Yes Inject 1 Device into the skin as directed. (re-use up to 8x with each new sensor) Silvana Newness, MD Taking Active   Dulaglutide [facility]) [id number] Yes Inject into the skin once a week. [provider] Taking Active   GAMUNEX-C 20 GM/200ML [name] [id number] Yes  [provider] Taking Active   GAMUNEX-C 5 GM/50ML SOLN [id number] Yes  [provider] Taking Active   insulin degludec (TRESIBA FLEXTOUCH) 100 UNIT/ML FlexTouch Pen [id number] Yes Inject into the skin daily. [provider] Taking Active            Med Note (Arielys Wandersee A   Mon Dec 20, 2020  2:23 PM) Taking until it runs out  insulin lispro (HUMALOG KWIKPEN) 200 UNIT/ML KwikPen [id number] Yes Inject up to 100 units daily per provider instructions Silvana Newness, MD Taking Active   Insulin Pen Needle (BD PEN NEEDLE NANO U/F) 32G X 4 MM MISC [id number] Yes Inject up to 8 times per day. Dellia Cloud, MD Taking Active   PRESCRIPTION MEDICATION [id number] Yes Inject into the vein. IVIG every 4 weeks. Order for new start sent to [facility] on 04/08/2020. 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   Noncompliance with medication treatment due to difficulty with dosing 05/25/2020   Mixed hyperlipidemia 05/25/2020   Depression 05/25/2020   Diabetic polyneuropathy associated with type 1 diabetes mellitus (HCC) 02/26/2019   Postprandial bloating 02/25/2019   Sensory problems with  limbs 02/25/2019   Uncontrolled type 1 diabetes mellitus with hyperglycemia ([place]) 05/24/2017   Morbid obesity ([place]) 05/24/2017   Acanthosis 05/24/2017    Conditions to be addressed/monitored per PCP order:   [name]  Care Plan : [place] of Care  Updates made by Melissa Montane, RN since 02/03/2021 12:00 AM      Problem: Knowledge Deficits and Care Coordination related to managing DM   Priority: High     Long-Range Goal: Development of plan of care to address needs and knowledge deficits related to [name]   Start Date: 01/03/2021  Expected End Date: 04/03/2021  Priority: High  Note:   Current Barriers:  Chronic Disease Management support and education needs related to Lucas Cunningham reports wearing CGM. BS at this time 220, he has not eaten today. Patient reports taking insulin as directed. . Patient needs to reschedule missed Endocrinology and PCP appointments. He did attend Neurology visit on 12/7 and needs to schedule appointments with PT/OT.  RNCM Clinical Goal(s):  Patient will verbalize understanding of plan for management of [name] as evidenced by patient's verbalization of and self management activities take all medications exactly as prescribed and will call provider for medication related questions as evidenced by documentation in EMR and improving lab values    attend all scheduled medical appointments: reschedule missed appointment with PCP and 12/7 with Neurology as evidenced by physician documentation in EMR        demonstrate improved adherence to prescribed treatment plan for [name] as evidenced by taking all medications as directed, eating a diabetic diet and attending appointments with PCP and specialist  through collaboration with RN Care manager, provider, and care team.   Interventions: Inter-disciplinary care team collaboration (see longitudinal plan of care) Evaluation of current treatment plan related to  self management and patient's adherence to plan as established by provider Discussed the importance of Lucas Cunningham attempting to schedule his appointments, not waiting for his grandmother to do so Explained to Lucas Cunningham where to find the information for medical transportation provided by [facility] on AVS/visit information that was sent in the mail, Lucas Cunningham was able to find the number and recite  back to Lucas Cunningham  Encouraged patient to contact [facility] [phone] for member benefits(telephone and rewards)   Diabetes:  (Status: Goal on Track (progressing): YES.) Long Term Goal   Lab Results  Component Value Date   HGBA1C 13.3 (A) 09/27/2020  Assessed patient's understanding of A1c goal: <7% Reviewed medications with patient and discussed importance of medication adherence;        Reviewed prescribed diet with patient encouraged to eat 3 meals plus snacks, increasing fruit and vegetables and cutting back on bread, pasta, potatoes and sugary sweets; Discussed plans with patient for ongoing care management follow up and provided patient with direct contact information for care management team;      Provided patient with written educational materials related to hypo and hyperglycemia and importance of correct treatment;       Reviewed scheduled/upcoming provider appointments including:  calling to reschedule missed appointment with PCP, Endocrinology, PT/OT and Eye Exam;         call provider for findings outside established parameters;       Review of patient status, including review of consultants reports, relevant laboratory and other test results, and medications completed;       Provided information to [facility], [address] Lucas Cunningham Lucas Cunningham, [place] [address], [phone]   Patient Goals/Self-Care Activities: Take medications as  prescribed   Attend all scheduled provider appointments Call pharmacy for medication refills 3-7 days in advance of running out of medications Call provider office for new concerns or questions  schedule appointment with eye doctor take the blood sugar meter to all doctor visits drink 6 to 8 glasses of water each day fill half of plate with vegetables keep a food diary manage portion size       Follow Up:  Patient agrees to Care Plan and Follow-up.  Plan: The Managed Medicaid care management team will reach out to the patient again  over the next 14 days.  Date/time of next scheduled RN care management/care coordination outreach:  02/18/21 @ 2:30pm  Lurena Joiner RN, BSN [place] RN Care Coordinator

## 2021-02-10 ENCOUNTER — Other Ambulatory Visit: Payer: Self-pay | Admitting: Internal Medicine

## 2021-02-10 DIAGNOSIS — E782 Mixed hyperlipidemia: Secondary | ICD-10-CM

## 2021-02-18 ENCOUNTER — Ambulatory Visit: Payer: Medicaid Other

## 2021-02-23 ENCOUNTER — Other Ambulatory Visit: Payer: Self-pay | Admitting: *Deleted

## 2021-02-23 NOTE — Patient Instructions (Signed)
Visit Information  [name]  - as a part of your Medicaid benefit, you are eligible for care management and care coordination services at no cost or copay. I was unable to reach you by phone today but would be happy to help you with your health related needs. Please feel free to call me @ [phone].   A member of the Managed Medicaid care management team will reach out to you again over the next 14 days.   Lurena Joiner RN, BSN [place] RN Care Coordinator

## 2021-02-23 NOTE — Patient Outreach (Signed)
Care Coordination  02/23/2021  Lucas Cunningham 08/07/2000 [id number]   Medicaid Managed Care   Unsuccessful Outreach Note  02/23/2021 Name: Lucas Cunningham MRN: [id number] DOB: [birth year]  Referred by: Steffanie Rainwater, MD Reason for referral : High Risk Managed Medicaid (Unsuccessful RNCM follow up telephone outreach)   An unsuccessful telephone outreach was attempted today. The patient was referred to the case management team for assistance with care management and care coordination.   Follow Up Plan: A HIPAA compliant phone message was left for the patient providing contact information and requesting a return call.   [name] RN, BSN [place]   [employer]

## 2021-02-24 ENCOUNTER — Encounter: Payer: Self-pay | Admitting: Occupational Therapy

## 2021-02-24 ENCOUNTER — Ambulatory Visit: Payer: Medicaid Other | Attending: Neurology

## 2021-02-24 ENCOUNTER — Other Ambulatory Visit: Payer: Self-pay

## 2021-02-24 ENCOUNTER — Ambulatory Visit: Payer: Medicaid Other | Admitting: Occupational Therapy

## 2021-02-24 VITALS — BP 115/83 | HR 93

## 2021-02-24 DIAGNOSIS — M6281 Muscle weakness (generalized): Secondary | ICD-10-CM | POA: Diagnosis present

## 2021-02-24 DIAGNOSIS — R278 Other lack of coordination: Secondary | ICD-10-CM

## 2021-02-24 DIAGNOSIS — R208 Other disturbances of skin sensation: Secondary | ICD-10-CM | POA: Diagnosis present

## 2021-02-24 DIAGNOSIS — R2681 Unsteadiness on feet: Secondary | ICD-10-CM | POA: Diagnosis present

## 2021-02-24 DIAGNOSIS — G6181 Chronic inflammatory demyelinating polyneuritis: Secondary | ICD-10-CM | POA: Insufficient documentation

## 2021-02-24 DIAGNOSIS — M25531 Pain in right wrist: Secondary | ICD-10-CM | POA: Diagnosis present

## 2021-02-24 DIAGNOSIS — R29818 Other symptoms and signs involving the nervous system: Secondary | ICD-10-CM | POA: Diagnosis present

## 2021-02-24 DIAGNOSIS — R2689 Other abnormalities of gait and mobility: Secondary | ICD-10-CM | POA: Diagnosis present

## 2021-02-24 NOTE — Therapy (Signed)
[facility] [address] [place], [place], [address] Phone: [phone]   Fax:  [phone]  Occupational Therapy Evaluation  Patient Details  Name: Lucas Cunningham MRN: [id number] Date of Birth: 17-Apr-2000 Referring Provider (OT): Delfin Edis   Encounter Date: 02/24/2021   OT End of Session - 02/24/21 1133     Visit Number 1    Number of Visits 17    Date for OT Re-Evaluation 04/10/21    Authorization Type [employer] Medicaid (Under 21)    Authorization Time Period Medicaid    Progress Note Due on Visit 10    OT Start Time 1010    OT Stop Time 1102    OT Time Calculation (min) 52 min    Activity Tolerance Patient tolerated treatment well    Behavior During Therapy WFL for tasks assessed/performed             Past Medical History:  Diagnosis Date   CIDP (chronic inflammatory demyelinating polyneuropathy) ([place])    Diabetes mellitus without complication ([place])    type 1    Past Surgical History:  Procedure Laterality Date   NO PAST SURGERIES      There were no vitals filed for this visit.   Subjective Assessment - 02/24/21 1122     Subjective  Get my hands stronger again.  Be able to type or something, write, draw    Currently in Pain? No/denies               [facility] OT Assessment - 02/24/21 1013       Assessment   Medical Diagnosis CIPD    Referring Provider (OT) Delfin Edis    Onset Date/Surgical Date 01/23/21    Hand Dominance Right    Prior Therapy Completed OP OT 06/28/20      Precautions   Precautions Fall    Required Braces or Orthoses Other Brace/Splint    Other Brace/Splint B AFO      Restrictions   Weight Bearing Restrictions No      Prior Function   Level of Independence Independent with basic ADLs;Independent with gait;Independent with homemaking with ambulation    Environmental health practitioner Requirements Taking some classes at [employer] [facility] [employer] [facility] possibly - not fully clear     Leisure Phone with friends, TV, music - would like to be able to play video games      ADL   Eating/Feeding Minimal assistance    Grooming Moderate assistance    Upper Body Bathing Modified independent    Lower Body Bathing Modified independent    Upper Body Dressing Minimal assistance    Lower Body Dressing Moderate assistance   socks and braces, fasteners   Toilet Transfer Modified independent    Toileting - Clothing Manipulation Modified independent    Toileting -  Hygiene Modified Independent    Tub/Shower Transfer Modified independent    Warden/ranger Grab bars      IADL   Prior Level of Function Shopping Independent    Shopping Needs to be accompanied on any shopping trip    Prior Level of Function Light Housekeeping Independent    Light Housekeeping Performs light daily tasks but cannot maintain acceptable level of cleanliness    Prior Level of Function Meal Prep Independent    Meal Prep Able to complete simple cold meal and snack prep    Prior Level of Function Medication Managment Independent    Medication Management Is not capable of dispensing or  managing own medication   insulin injection     Written Expression   Dominant Hand Right    Handwriting 75% legible      Vision - History   Baseline Vision --   Needs glasses per his report     Vision Assessment   Eye Alignment Within Functional Limits      Activity Tolerance   Activity Tolerance Comments Reports improved strength since IVIG      Cognition   Overall Cognitive Status History of cognitive impairments - at baseline    Cognition Comments ADHD      Observation/Other Assessments   Focus on Therapeutic Outcomes (FOTO)  NA      Posture/Postural Control   Posture/Postural Control Postural limitations    Postural Limitations Rounded Shoulders;Increased thoracic kyphosis;Posterior pelvic tilt    Posture Comments Able to correct with cueing, difficulty sustaining      Sensation   Light Touch  Impaired Detail    Light Touch Impaired Details Impaired LUE;Impaired RUE   Ulnar aspect   Stereognosis Not tested    Hot/Cold Impaired by gross assessment   diminshed LUE   Proprioception Not tested    Additional Comments diminished sensation ulnar aspects of both hands, more profound sensory impairent in LUE      Coordination   Gross Motor Movements are Fluid and Coordinated No    Fine Motor Movements are Fluid and Coordinated No    9 Hole Peg Test Right    Right 9 Hole Peg Test 3 pegs in 56.10    Box and Blocks R:  35  l:  25      Perception   Perception Within Functional Limits      Praxis   Praxis Intact      Tone   Assessment Location Right Upper Extremity;Left Upper Extremity      ROM / Strength   AROM / PROM / Strength AROM;Strength      AROM   Overall AROM  Within functional limits for tasks performed    Overall AROM Comments Proximal BUE      Strength   Overall Strength Deficits    Strength Assessment Site Shoulder;Elbow;Forearm;Wrist    Right/Left Shoulder Right;Left    Right Shoulder Flexion 4-/5    Right Shoulder ABduction 4-/5    Left Shoulder Flexion 3+/5    Left Shoulder ABduction 4-/5    Right/Left Elbow Right;Left    Right Elbow Flexion 4+/5    Right Elbow Extension 4+/5    Left Elbow Flexion 4+/5    Left Elbow Extension 4+/5    Right/Left Wrist Right;Left    Right Wrist Flexion 4-/5    Right Wrist Extension 4-/5    Left Wrist Flexion 3+/5    Left Wrist Extension 2/5      Hand Function   Right Hand Gross Grasp Impaired    Right Hand Grip (lbs) 26.8    Right Hand Lateral Pinch 6 lbs    Left Hand Gross Grasp Impaired    Left Hand Grip (lbs) 4.1    Left Hand Lateral Pinch 2 lbs      RUE Tone   RUE Tone Hypotonic                              OT Education - 02/24/21 1133     Education Details Reviewed potential goals and plan of care with patient    Person(s) Educated Patient  Methods Explanation    Comprehension  Verbalized understanding              OT Short Term Goals - 02/24/21 1320       OT SHORT TERM GOAL #1   Title Patient will demonstrate ability to put on and take off his socks with mod assist    Baseline Dependent    Time 4    Period Weeks    Status New    Target Date 04/10/21      OT SHORT TERM GOAL #2   Title Patient will demonstrate ability to pull up zipper once zipper closed, or Pull down zipper on jacket with min assist    Baseline Dependent    Time 4    Period Weeks    Status New      OT SHORT TERM GOAL #3   Title Patient will demonstrate ability to legibly print a sentence with dominant right hand and built up grip    Baseline Able to print his name 75% legibility    Time 4    Period Weeks    Status New      OT SHORT TERM GOAL #4   Title Patient will demonstrate ability to type on computer, or navigate controls for gaming with set up and increased time    Baseline Unable to type, use computer consistently    Time 4    Period Weeks    Status New      OT SHORT TERM GOAL #5   Title xxx    Baseline xxx               OT Long Term Goals - 02/24/21 1332       OT LONG TERM GOAL #1   Title Patient will don/doff socks with modified independence    Baseline dependent    Time 8    Period Weeks    Status New    Target Date 05/10/21      OT LONG TERM GOAL #2   Title .Patient will perform simple cooking on stovetop with supervision and intermittent assistance    Baseline Is not completing  - dependent on grandmother    Time 8    Period Weeks    Status New      OT LONG TERM GOAL #3   Title Patient will improve grip strength to allow him to open a chip bag with his hands    Baseline Uses his teeth to tear open packages    Time 8    Period Weeks      OT LONG TERM GOAL #4   Title Patient will demonstrate improved fine motor control such that he can complete the 9 hole peg test in less than 2 min with RUE    Baseline RUE - Just under 1 min for 3 pegs     Time 8    Period Weeks    Status New      OT LONG TERM GOAL #5   Title Patient will demonstrate 75% active wrist extension against gravity in LUE to help with grip strength    Baseline Cannot extend wrist against gravity    Time 8    Period Weeks    Status New      OT LONG TERM GOAL #6   Title Patient will demobnstrate a 5 lb increase in grip strength in BUE    Baseline R 26, L 4    Time 8    Period  Weeks    Status New      OT LONG TERM GOAL #7   Title Pt will be compliant and verbalize understanding of wear and care instructions for any splints and/or orthoses PRN    Baseline Patient's custom splint was damaged and is no longer useable    Time 8    Period Weeks    Status New                   Plan - 02/24/21 1136     Clinical Impression Statement Patient is a 21 yr old man returning to [place] for treatment of CIPD, S/P IVIG treatment.  Patient presents to OT evaluation with the following deficits which impede performance of ADL/IADL; Hypotonic BUE, Decreased strength in Bilateral UE more pronounced distally in  hands/ wrists (although significant improvement in RUE strength), decreased sensation in BUE - L>R, decreased coordination in BUE.  Patient will benefit from skilled OT intervention to increase participation and autonomy with ADL/IADL.    OT Occupational Profile and History Detailed Assessment- Review of Records and additional review of physical, cognitive, psychosocial history related to current functional performance    Occupational performance deficits (Please refer to evaluation for details): ADL's;IADL's    Body Structure / Function / Physical Skills ADL;Decreased knowledge of use of DME;Strength;Balance;Dexterity;GMC;Tone;UE functional use;Body mechanics;Endurance;IADL;ROM;Sensation;Mobility;Flexibility;Coordination;Decreased knowledge of precautions;FMC    Rehab Potential Good    Clinical Decision Making Several treatment options, min-mod task modification  necessary    Comorbidities Affecting Occupational Performance: May have comorbidities impacting occupational performance    Modification or Assistance to Complete Evaluation  No modification of tasks or assist necessary to complete eval    OT Frequency 2x / week    OT Duration 8 weeks    OT Treatment/Interventions Self-care/ADL training;Aquatic Therapy;DME and/or AE instruction;Splinting;Balance training;Therapeutic activities;Therapeutic exercise;Passive range of motion;Functional Mobility Training;Neuromuscular education;Electrical Stimulation;Manual Therapy;Patient/family education    Plan Review HEP, and prior splints, Needs updated HEP - functional UE use, problem solve zippers and socks    Consulted and Agree with Plan of Care Patient             Patient will benefit from skilled therapeutic intervention in order to improve the following deficits and impairments:   Body Structure / Function / Physical Skills: ADL, Decreased knowledge of use of DME, Strength, Balance, Dexterity, GMC, Tone, UE functional use, Body mechanics, Endurance, IADL, ROM, Sensation, Mobility, Flexibility, Coordination, Decreased knowledge of precautions, [place]       Visit Diagnosis: Muscle weakness (generalized) - Plan: Ot plan of care cert/re-cert  Other disturbances of skin sensation - Plan: Ot plan of care cert/re-cert  Other lack of coordination - Plan: Ot plan of care cert/re-cert  Unsteadiness on feet - Plan: Ot plan of care cert/re-cert  Other symptoms and signs involving the nervous system - Plan: Ot plan of care cert/re-cert    Problem List Patient Active Problem List   Diagnosis Date Noted   Proteinuria 11/23/2020   CIDP (chronic inflammatory demyelinating polyneuropathy) ([place]) 05/25/2020   Noncompliance with medication treatment due to difficulty with dosing 05/25/2020   Mixed hyperlipidemia 05/25/2020   Depression 05/25/2020   Diabetic polyneuropathy associated with type 1 diabetes  mellitus ([place]) 02/26/2019   Postprandial bloating 02/25/2019   Sensory problems with limbs 02/25/2019   Uncontrolled type 1 diabetes mellitus with hyperglycemia ([place]) 05/24/2017   Morbid obesity ([place]) 05/24/2017   Acanthosis 05/24/2017    Mariah Milling, OT 02/24/2021, 1:47 PM  [facility]  [facility] [address] [place], [place], [address] Phone: [phone]   Fax:  [phone]  Name: Lucas Cunningham MRN: [id number] Date of Birth: November 11, 2000

## 2021-02-24 NOTE — Therapy (Signed)
[facility] [address] [place], [place], [address] Phone: [phone]   Fax:  [phone]  Physical Therapy Evaluation  Patient Details  Name: Lucas Cunningham MRN: [id number] Date of Birth: Dec 17, 2000 Referring Provider (PT): Sarina Ill, MD   Encounter Date: 02/24/2021   PT End of Session - 02/24/21 0917     Visit Number 1    Number of Visits 13    Date for PT Re-Evaluation 04/08/21    Authorization Type [employer]    Authorization Time Period awaiting authorization    PT Start Time 0920    PT Stop Time 1000    PT Time Calculation (min) 40 min    Activity Tolerance Patient tolerated treatment well    Behavior During Therapy [facility] for tasks assessed/performed             Past Medical History:  Diagnosis Date   CIDP (chronic inflammatory demyelinating polyneuropathy) ([place])    Diabetes mellitus without complication ([place])    type 1    Past Surgical History:  Procedure Laterality Date   NO PAST SURGERIES      Vitals:   02/24/21 0932  BP: 115/83  Pulse: 93      Subjective Assessment - 02/24/21 0917     Subjective Patient was diagnosed with chronic inflammatory demyelinating polyneuropathy. Patient has started IVIG, and recieved a few treatments. Feels as the IVIG has helped him. Patient has bilateral AFOs, wearing in session today. Reports he is not wearing braces unless he is going out in the community, especially if unaware of environment. Currently ambulating with a RW into session, reports using out in the community. Has tried ambulating without the device outside, but difficult Denies use of AD in the house. Has had one fall backwards, no injuries. Reports blood sugar is still fluctatuating as well.    Pertinent History Chronic inflammatory demyelinating polyneuropathy, Uncontrolled DM Type 1, ADHD    Limitations Standing;Walking;House hold activities    Diagnostic tests MRI c-spine and L-spine  unremarkable    Patient Stated Goals Improve balance with Standing; Get Stronger.    Currently in Pain? No/denies                [facility] PT Assessment - 02/24/21 0001       Assessment   Medical Diagnosis CIPD    Referring Provider (PT) Sarina Ill, MD    Onset Date/Surgical Date 01/23/21   referral date   Hand Dominance Right    Prior Therapy Prior PT/OT at this clinic in 2021-22      Precautions   Precautions Fall    Required Braces or Orthoses Other Brace/Splint   has bilateral AFO's     Restrictions   Weight Bearing Restrictions No      Balance Screen   Has the patient fallen in the past 6 months Yes    How many times? 1    Has the patient had a decrease in activity level because of a fear of falling?  No    Is the patient reluctant to leave their home because of a fear of falling?  No      Home Environment   Living Environment Private residence    Living Arrangements Other relatives   Grandma   Available Help at Discharge Family    Type of [place] to enter    Entrance Stairs-Number of Steps 3   posterior house; reports going in the front  with one step up.   Entrance Stairs-Rails Right;Left;Can reach both    Home Layout One level    Home Equipment Walker - 2 wheels;Grab bars - toilet;Grab bars - tub/shower    Additional Comments reports he does not use RW in the house.      Prior Function   Level of Independence Independent with basic ADLs;Requires assistive device for independence   reports assistance with cooking and putting on socks     Cognition   Overall Cognitive Status Within Functional Limits for tasks assessed      Observation/Other Assessments   Focus on Therapeutic Outcomes (FOTO)  N/A      Sensation   Light Touch Impaired Detail    Light Touch Impaired Details Impaired RLE;Impaired LLE   pt reports feels as LUE worse > RUE     Coordination   Gross Motor Movements are Fluid and Coordinated Yes    Heel Shin Test WFL       ROM / Strength   AROM / PROM / Strength Strength      Strength   Overall Strength Deficits    Strength Assessment Site Hip;Knee;Ankle    Right/Left Hip Right;Left    Right Hip Flexion 4-/5    Left Hip Flexion 4-/5    Right/Left Knee Right;Left    Right Knee Flexion 3+/5    Right Knee Extension 3+/5    Left Knee Flexion 3+/5    Left Knee Extension 3+/5    Right/Left Ankle Right;Left    Right Ankle Dorsiflexion 1/5    Left Ankle Dorsiflexion 1/5      Transfers   Transfers Sit to Stand;Stand to Sit    Sit to Stand 5: Supervision    Five time sit to stand comments  22.62 secs with UE support, posterior lean using BLE against chair    Stand to Sit 5: Supervision    Comments with sit <> stands mild unsteadiness upon standing, heavy reliance on UE noted      Ambulation/Gait   Ambulation/Gait Yes    Ambulation/Gait Assistance 5: Supervision    Ambulation/Gait Assistance Details Ambulation with RW and bil AFO's done. Supervision with gait, good clearance noted with BLE. Continued education on use of AD for safety within the home    Ambulation Distance (Feet) 100 Feet    Assistive device Rolling walker    Gait Pattern Step-through pattern;Decreased stride length;Decreased dorsiflexion - right;Decreased dorsiflexion - left;Wide base of support    Ambulation Surface Level;Indoor    Gait velocity 13.93 secs w/ RW = 2.35 ft/sec (supervision level)    Stairs Yes    Stairs Assistance 5: Supervision    Stairs Assistance Details (indicate cue type and reason) ascend/descend with bilateral rails, alternating pattern with ascending, step too pattern with descent x 4 stairs.    Stair Management Technique Two rails;Alternating pattern;Step to pattern;Forwards      Balance   Balance Assessed Yes      Static Standing Balance   Static Standing - Balance Support No upper extremity supported    Static Standing - Level of Assistance 5: Stand by assistance    Static Standing - Comment/# of  Minutes able to stand 1 min 12 secs without UE support, RW in front for safety. Rest break needed to shakiness/fatigue in BLE.   trialed dynamic standing balance, patient able to reach 8" without UE support and no bracing with BLE.     Standardized Balance Assessment   Standardized Balance Assessment Timed Up  and Go Test      Timed Up and Go Test   TUG Normal TUG    Normal TUG (seconds) 18.57   with RW              Objective measurements completed on examination: See above findings.     PT Education - 02/24/21 0953     Education Details Educated on [employer]) Educated Patient    Methods Explanation    Comprehension Verbalized understanding              PT Short Term Goals - 02/24/21 1059       PT SHORT TERM GOAL #1   Title Pt will be IND with initial HEP to demo improved function and balance (ALL STGs Due: 03/18/21)    Baseline no HEP established    Time 3    Period Weeks    Status New    Target Date 03/18/21      PT SHORT TERM GOAL #2   Title Pt will be able to stand statically without support at [facility] for >/= 1 min 30 seconds with supervision in order to indicate improved balance and strength.    Baseline 1 min, 12 seconds    Time 3    Period Weeks    Status New      PT SHORT TERM GOAL #3   Title Pt will improve gait speed to >/= 2.5 ft/sec w/ LRAD and B AFO as needed to indicate dec fall risk.    Baseline 2.35 ft/sec w/ B AFOs and RW    Time 3    Period Weeks    Status New      PT SHORT TERM GOAL #4   Title Pt will improve 5TSS to </= 20 secs with BUE support in order to indicate improved strength.    Baseline 22.62 seconds    Time 3    Period Weeks    Status New               PT Long Term Goals - 02/24/21 1101       PT LONG TERM GOAL #1   Title Pt will be IND with final HEP in order to indicate improved functional mobility and dec fall risk.  ALL LTGS DUE 04/08/21    Baseline no HEP established    Time 6    Period Weeks     Status New    Target Date 04/08/21      PT LONG TERM GOAL #2   Title Pt will improve gait speed to >/=2.70 ft/sec with LRAD and B AFOs as needed in order to indicate safe community ambulation.    Baseline 2.35 ft/sec    Time 6    Period Weeks    Status New      PT LONG TERM GOAL #3   Title Pt will improve 5TSS to </=17 secs with single UE support only in order to indicate improved functional strength.    Baseline 22.62 secs    Time 6    Period Weeks    Status New      PT LONG TERM GOAL #4   Title Pt will improve TUG to </= 15 seconds with LRAD to demo improved balance and reduced fall risk    Baseline 18.57 secs    Time 6    Period Weeks    Status New      PT LONG TERM GOAL #5   Title  Pt will perform dynamic standing balance without UE support x 2 mins in order to perform ADLs safely at home.    Baseline stand dynamic without UE support x 1 minute    Time 6    Period Weeks    Status New      Additional Long Term Goals   Additional Long Term Goals Yes      PT LONG TERM GOAL #6   Title Pt will be able to ambulate x 500 over level and unlevel surfaces w/ LRAD and B AFO's to demo improved safe community mobility    Baseline 100 ft indoors with RW    Time 6    Period Weeks    Status New                    Plan - 02/24/21 0919     Clinical Impression Statement Patient is a 21 y.o. male referred for Chronic inflammatory demyelinating polyneuropathy. Patient's PMH significant for the following: Chronic inflammatory demyelinating polyneuropathy, Uncontrolled DM Type 1, ADHD. Patient presents with the following impairments upon evaluation: decreased strength, abnormal gait, impaired sensation, impaired balance, and fall risk. With 5x sit <> stand, TUG, and gait speed patient is at increased risk for falls. Patient is able to stand for approx 1 minute without UE support. Patient will benefit from skilled PT services to address impairments and maximize functional  mobility.    Personal Factors and Comorbidities Comorbidity 2    Comorbidities Chronic inflammatory demyelinating polyneuropathy, Uncontrolled DM Type 1, ADHD    Examination-Activity Limitations Dressing;Transfers;Stairs;Stand;Locomotion Level;Bend;Lift    Examination-Participation Restrictions Community Activity;Cleaning;Laundry;Meal Prep    Stability/Clinical Decision Making Evolving/Moderate complexity    Clinical Decision Making Moderate    Rehab Potential Good    PT Frequency 2x / week    PT Duration 6 weeks    PT Treatment/Interventions ADLs/Self Care Home Management;Aquatic Therapy;Cryotherapy;Moist Heat;DME Instruction;Gait training;Stair training;Functional mobility training;Therapeutic activities;Therapeutic exercise;Balance training;Neuromuscular re-education;Patient/family education;Orthotic Fit/Training;Manual techniques;Passive range of motion    PT Next Visit Plan Potential to trial [place] Balance; Review prior HEP and update    Consulted and Agree with Plan of Care Patient             Patient will benefit from skilled therapeutic intervention in order to improve the following deficits and impairments:  Abnormal gait, Decreased balance, Difficulty walking, Impaired sensation, Decreased strength, Decreased activity tolerance, Impaired UE functional use, Decreased endurance, Decreased knowledge of precautions, Decreased knowledge of use of DME, Impaired perceived functional ability  Visit Diagnosis: Unsteadiness on feet  Other abnormalities of gait and mobility  Muscle weakness (generalized)  Other disturbances of skin sensation     Problem List Patient Active Problem List   Diagnosis Date Noted   Proteinuria 11/23/2020   CIDP (chronic inflammatory demyelinating polyneuropathy) ([place]) 05/25/2020   Noncompliance with medication treatment due to difficulty with dosing 05/25/2020   Mixed hyperlipidemia 05/25/2020   Depression 05/25/2020   Diabetic polyneuropathy  associated with type 1 diabetes mellitus ([place]) 02/26/2019   Postprandial bloating 02/25/2019   Sensory problems with limbs 02/25/2019   Uncontrolled type 1 diabetes mellitus with hyperglycemia ([place]) 05/24/2017   Morbid obesity ([place]) 05/24/2017   Acanthosis 05/24/2017   [employer] Authorization Peds  Choose one: Neuro Rehabilitative  Standardized Assessment: Other: 5x sit to stand test = 22.62 seconds, TUG = 18.57 seconds, gait speed = 2.35 ft/sec  Standardized Assessment Documents a Deficit and or below the 10th percentile (>1.5 standard deviations below normal for the  patient's age)? Yes   Please select the following statement that best describes the patient's presentation or goal of treatment: Other/none of the above: Decline in functioning status, impaired mobility  Please rate overall deficits/functional limitations: moderate     Jones Bales, PT, DPT 02/24/2021, 11:50 AM  [facility] [address] [place], [place], [address] Phone: [phone]   Fax:  [phone]  Name: Lucas Cunningham MRN: [id number] Date of Birth: 2000-07-24

## 2021-02-28 ENCOUNTER — Telehealth: Payer: Self-pay | Admitting: *Deleted

## 2021-02-28 NOTE — Telephone Encounter (Signed)
From last PT note,02-24-2021 states pt has received IVIG (2 treatments) feels that it has helped. [facility].  [phone].  I [facility] but was not able to speak to anyone from the local #.  I called pt and he stated that he believes the last infusion was 02-01-21, due in 4 wks.  (He states that it is working for him).  Has a broken IV pole?  (They have [facility] the [facility], and will let them know when they call prior to his next treatment (as a reminder).

## 2021-02-28 NOTE — Telephone Encounter (Signed)
-----   Message from Melvenia Beam, MD sent at 02/26/2021 11:08 AM EST ----- Is patient getting IVIG?

## 2021-03-01 ENCOUNTER — Other Ambulatory Visit (INDEPENDENT_AMBULATORY_CARE_PROVIDER_SITE_OTHER): Payer: Self-pay | Admitting: Pediatrics

## 2021-03-01 DIAGNOSIS — E1065 Type 1 diabetes mellitus with hyperglycemia: Secondary | ICD-10-CM

## 2021-03-02 ENCOUNTER — Encounter: Payer: Self-pay | Admitting: Rehabilitation

## 2021-03-02 ENCOUNTER — Ambulatory Visit: Payer: Medicaid Other | Admitting: Occupational Therapy

## 2021-03-02 ENCOUNTER — Other Ambulatory Visit: Payer: Self-pay

## 2021-03-02 ENCOUNTER — Ambulatory Visit: Payer: Medicaid Other | Admitting: Rehabilitation

## 2021-03-02 DIAGNOSIS — R2689 Other abnormalities of gait and mobility: Secondary | ICD-10-CM

## 2021-03-02 DIAGNOSIS — M25531 Pain in right wrist: Secondary | ICD-10-CM

## 2021-03-02 DIAGNOSIS — R208 Other disturbances of skin sensation: Secondary | ICD-10-CM

## 2021-03-02 DIAGNOSIS — M6281 Muscle weakness (generalized): Secondary | ICD-10-CM

## 2021-03-02 DIAGNOSIS — R278 Other lack of coordination: Secondary | ICD-10-CM

## 2021-03-02 DIAGNOSIS — R2681 Unsteadiness on feet: Secondary | ICD-10-CM

## 2021-03-02 DIAGNOSIS — R29818 Other symptoms and signs involving the nervous system: Secondary | ICD-10-CM

## 2021-03-02 NOTE — Therapy (Signed)
[facility] [address] [place], [place], [address] Phone: [phone]   Fax:  [phone]  Occupational Therapy Treatment  Patient Details  Name: Lucas Cunningham MRN: [id number] Date of Birth: 2001/02/05 Referring Provider (OT): Delfin Edis   Encounter Date: 03/02/2021   OT End of Session - 03/02/21 1011     Visit Number 2    Number of Visits 17    Date for OT Re-Evaluation 04/10/21    Authorization Type [employer] (Under 21)    Authorization Time Period Medicaid - 6 visits approved 03-02-21 - 05/13/21    Authorization - Number of Visits 6    Progress Note Due on Visit 10    OT Start Time 1016    OT Stop Time 1100    OT Time Calculation (min) 44 min    Activity Tolerance Patient tolerated treatment well    Behavior During Therapy WFL for tasks assessed/performed             Past Medical History:  Diagnosis Date   CIDP (chronic inflammatory demyelinating polyneuropathy) ([place])    Diabetes mellitus without complication ([place])    type 1    Past Surgical History:  Procedure Laterality Date   NO PAST SURGERIES      There were no vitals filed for this visit.   Subjective Assessment - 03/02/21 1018     Subjective  "nothing's new"    Currently in Pain? No/denies                          OT Treatments/Exercises (OP) - 03/02/21 1035       ADLs   UB Dressing pt reports he is able to pull zipper up once clasp is managed - will complete in clinic at another time to verify for goal    LB Dressing pt indicating that he assisted some with donning socks today. Grandma assisted with putting socks on toes and patient assisted with pulling up the rest of the way. Pt was able to do RLE and needed more assistane for LLE.    ADL Comments reviewed goals - pt verbalied understanding.      Exercises   Exercises Hand      Fine Motor Coordination (Hand/Wrist)   Fine Motor Coordination Flipping  cards;In hand manipuation training;Manipulating coins;Small Pegboard    In Hand Manipulation Training with connect four chips with RUE and placing in palm and translating to fingertips to place in board.    Small Pegboard MEDIUM pegs - with RUE with min/mod difficulty with coordination with 3-4 drops and with copying pattern without difficulty    Flipping cards with RUE flipped regular sized playing cards with min difficulty but good speed and accuracy - pt showing much improvement from last time in therapy with coordination with RUE and LUE    Manipulating coins connect four chips and placing into board/frame with LUE with min difficulty and emphasis on maintaining wrist extension when releasing                      OT Short Term Goals - 03/02/21 1039       OT SHORT TERM GOAL #1   Title Patient will demonstrate ability to put on and take off his socks with mod assist    Baseline Dependent    Time 4    Period Weeks    Status On-going    Target Date 04/10/21  OT SHORT TERM GOAL #2   Title Patient will demonstrate ability to pull up zipper once zipper closed, or Pull down zipper on jacket with min assist    Baseline Dependent    Time 4    Period Weeks    Status On-going      OT SHORT TERM GOAL #3   Title Patient will demonstrate ability to legibly print a sentence with dominant right hand and built up grip    Baseline Able to print his name 75% legibility    Time 4    Period Weeks    Status New      OT SHORT TERM GOAL #4   Title Patient will demonstrate ability to type on computer, or navigate controls for gaming with set up and increased time    Baseline Unable to type, use computer consistently    Time 4    Period Weeks    Status New      OT SHORT TERM GOAL #5   Title xxx    Baseline xxx               OT Long Term Goals - 02/24/21 1332       OT LONG TERM GOAL #1   Title Patient will don/doff socks with modified independence    Baseline dependent     Time 8    Period Weeks    Status New    Target Date 05/10/21      OT LONG TERM GOAL #2   Title .Patient will perform simple cooking on stovetop with supervision and intermittent assistance    Baseline Is not completing  - dependent on grandmother    Time 8    Period Weeks    Status New      OT LONG TERM GOAL #3   Title Patient will improve grip strength to allow him to open a chip bag with his hands    Baseline Uses his teeth to tear open packages    Time 8    Period Weeks      OT LONG TERM GOAL #4   Title Patient will demonstrate improved fine motor control such that he can complete the 9 hole peg test in less than 2 min with RUE    Baseline RUE - Just under 1 min for 3 pegs    Time 8    Period Weeks    Status New      OT LONG TERM GOAL #5   Title Patient will demonstrate 75% active wrist extension against gravity in LUE to help with grip strength    Baseline Cannot extend wrist against gravity    Time 8    Period Weeks    Status New      OT LONG TERM GOAL #6   Title Patient will demobnstrate a 5 lb increase in grip strength in BUE    Baseline R 26, L 4    Time 8    Period Weeks    Status New      OT LONG TERM GOAL #7   Title Pt will be compliant and verbalize understanding of wear and care instructions for any splints and/or orthoses PRN    Baseline Patient's custom splint was damaged and is no longer useable    Time 8    Period Weeks    Status New                   Plan - 03/02/21 1049  Clinical Impression Statement Pt is showing significant increased function with RUE as well as with LUE however continues to have decreased independence with ADLs and IADLs. Pt only approved for 6 visits with [facility] Medicaid. Pt would benefit significant from skilled OT for continuing to target these areas, education and increasing functional use of BUE for increased independence.    OT Occupational Profile and History Detailed Assessment- Review of Records and  additional review of physical, cognitive, psychosocial history related to current functional performance    Occupational performance deficits (Please refer to evaluation for details): ADL's;IADL's    Body Structure / Function / Physical Skills ADL;Decreased knowledge of use of DME;Strength;Balance;Dexterity;GMC;Tone;UE functional use;Body mechanics;Endurance;IADL;ROM;Sensation;Mobility;Flexibility;Coordination;Decreased knowledge of precautions;FMC    Rehab Potential Good    Clinical Decision Making Several treatment options, min-mod task modification necessary    Comorbidities Affecting Occupational Performance: May have comorbidities impacting occupational performance    Modification or Assistance to Complete Evaluation  No modification of tasks or assist necessary to complete eval    OT Frequency 2x / week    OT Duration 8 weeks    OT Treatment/Interventions Self-care/ADL training;Aquatic Therapy;DME and/or AE instruction;Splinting;Balance training;Therapeutic activities;Therapeutic exercise;Passive range of motion;Functional Mobility Training;Neuromuscular education;Electrical Stimulation;Manual Therapy;Patient/family education    Plan Review HEP, and prior splints, Needs updated HEP - functional UE use, problem solve zippers and socks    Consulted and Agree with Plan of Care Patient             Patient will benefit from skilled therapeutic intervention in order to improve the following deficits and impairments:   Body Structure / Function / Physical Skills: ADL, Decreased knowledge of use of DME, Strength, Balance, Dexterity, GMC, Tone, UE functional use, Body mechanics, Endurance, IADL, ROM, Sensation, Mobility, Flexibility, Coordination, Decreased knowledge of precautions, [facility]       Visit Diagnosis: Muscle weakness (generalized)  Other lack of coordination  Unsteadiness on feet  Other symptoms and signs involving the nervous system  Other abnormalities of gait and  mobility  Pain in right wrist    Problem List Patient Active Problem List   Diagnosis Date Noted   Proteinuria 11/23/2020   CIDP (chronic inflammatory demyelinating polyneuropathy) ([place]) 05/25/2020   Noncompliance with medication treatment due to difficulty with dosing 05/25/2020   Mixed hyperlipidemia 05/25/2020   Depression 05/25/2020   Diabetic polyneuropathy associated with type 1 diabetes mellitus ([place]) 02/26/2019   Postprandial bloating 02/25/2019   Sensory problems with limbs 02/25/2019   Uncontrolled type 1 diabetes mellitus with hyperglycemia ([place]) 05/24/2017   Morbid obesity ([place]) 05/24/2017   Acanthosis 05/24/2017    Zachery Conch, OT 03/02/2021, 10:52 AM  [facility] [address] [place], [place], [address] Phone: [phone]   Fax:  [phone]  Name: Lucas Cunningham MRN: [id number] Date of Birth: 12/18/2000

## 2021-03-02 NOTE — Patient Instructions (Signed)
Access Code: [email or web address] URL: [email or web address] Date: 03/02/2021 Prepared by: Harriet Butte  Exercises Sit to Stand with Armchair - 2 x daily - 7 x weekly - 2 sets - 10 reps Walking March - 1 x daily - 7 x weekly - 1 sets - 4 reps Sidestepping in Squat with Resistance and Arms Forward - 1 x daily - 7 x weekly - 1 sets - 4 reps

## 2021-03-02 NOTE — Therapy (Signed)
[facility] [address] [place], [place], [address] Phone: [phone]   Fax:  [phone]  Physical Therapy Treatment  Patient Details  Name: Lucas Cunningham MRN: [id number] Date of Birth: Apr 21, 2000 Referring Provider (PT): Sarina Ill, MD   Encounter Date: 03/02/2021   PT End of Session - 03/02/21 1108     Visit Number 2    Number of Visits 13    Date for PT Re-Evaluation 04/08/21    Authorization Type [employer]    Authorization Time Period awaiting authorization    PT Start Time 0945   had to rearrange transportation   PT Stop Time 1015    PT Time Calculation (min) 30 min    Activity Tolerance Patient tolerated treatment well    Behavior During Therapy [facility] for tasks assessed/performed             Past Medical History:  Diagnosis Date   CIDP (chronic inflammatory demyelinating polyneuropathy) ([place])    Diabetes mellitus without complication ([place])    type 1    Past Surgical History:  Procedure Laterality Date   NO PAST SURGERIES      There were no vitals filed for this visit.   Subjective Assessment - 03/02/21 0950     Subjective No complaints, no pain    Pertinent History Chronic inflammatory demyelinating polyneuropathy, Uncontrolled DM Type 1, ADHD    Currently in Pain? No/denies                               [facility] Adult PT Treatment/Exercise - 03/02/21 0950       Standardized Balance Assessment   Standardized Balance Assessment Berg Balance Test      Berg Balance Test   Sit to Stand Able to stand  independently using hands    Standing Unsupported Able to stand safely 2 minutes    Sitting with Back Unsupported but Feet Supported on Floor or Stool Able to sit safely and securely 2 minutes    Stand to Sit Controls descent by using hands    Transfers Able to transfer safely, definite need of hands    Standing Unsupported with Eyes Closed Able to stand 10 seconds  with supervision    Standing Ubsupported with Feet Together Able to place feet together independently but unable to hold for 30 seconds    From Standing, Reach Forward with Outstretched Arm Can reach confidently >25 cm (10")    From Standing Position, Pick up Object from Floor Able to pick up shoe, needs supervision    From Standing Position, Turn to Look Behind Over each Shoulder Looks behind from both sides and weight shifts well    Turn 360 Degrees Able to turn 360 degrees safely in 4 seconds or less    Standing Unsupported, Alternately Place Feet on Step/Stool Able to complete >2 steps/needs minimal assist    Standing Unsupported, One Foot in Front Able to plae foot ahead of the other independently and hold 30 seconds    Standing on One Leg Able to lift leg independently and hold equal to or more than 3 seconds   standing on R LE   Total Score 43              Access Code: [email or web address] URL: [email or web address] Date: 03/02/2021 Prepared by: Cameron Sprang   Exercises Sit to Stand with Armchair - 2 x daily - 7 x weekly - 2 sets -  10 reps Walking March - 1 x daily - 7 x weekly - 1 sets - 4 reps Sidestepping in Squat with Resistance and Arms Forward - 1 x daily - 7 x weekly - 1 sets - 4 reps       PT Education - 03/02/21 1108     Education Details BERG results, HEP    Person(s) Educated Patient    Methods Explanation;Demonstration;Handout    Comprehension Verbalized understanding;Returned demonstration              PT Short Term Goals - 02/24/21 1059       PT SHORT TERM GOAL #1   Title Pt will be IND with initial HEP to demo improved function and balance (ALL STGs Due: 03/18/21)    Baseline no HEP established    Time 3    Period Weeks    Status New    Target Date 03/18/21      PT SHORT TERM GOAL #2   Title Pt will be able to stand statically without support at [facility] for >/= 1 min 30 seconds with supervision in order to indicate improved balance and  strength.    Baseline 1 min, 12 seconds    Time 3    Period Weeks    Status New      PT SHORT TERM GOAL #3   Title Pt will improve gait speed to >/= 2.5 ft/sec w/ LRAD and B AFO as needed to indicate dec fall risk.    Baseline 2.35 ft/sec w/ B AFOs and RW    Time 3    Period Weeks    Status New      PT SHORT TERM GOAL #4   Title Pt will improve 5TSS to </= 20 secs with BUE support in order to indicate improved strength.    Baseline 22.62 seconds    Time 3    Period Weeks    Status New               PT Long Term Goals - 02/24/21 1101       PT LONG TERM GOAL #1   Title Pt will be IND with final HEP in order to indicate improved functional mobility and dec fall risk.  ALL LTGS DUE 04/08/21    Baseline no HEP established    Time 6    Period Weeks    Status New    Target Date 04/08/21      PT LONG TERM GOAL #2   Title Pt will improve gait speed to >/=2.70 ft/sec with LRAD and B AFOs as needed in order to indicate safe community ambulation.    Baseline 2.35 ft/sec    Time 6    Period Weeks    Status New      PT LONG TERM GOAL #3   Title Pt will improve 5TSS to </=17 secs with single UE support only in order to indicate improved functional strength.    Baseline 22.62 secs    Time 6    Period Weeks    Status New      PT LONG TERM GOAL #4   Title Pt will improve TUG to </= 15 seconds with LRAD to demo improved balance and reduced fall risk    Baseline 18.57 secs    Time 6    Period Weeks    Status New      PT LONG TERM GOAL #5   Title Pt will perform dynamic standing balance without UE  support x 2 mins in order to perform ADLs safely at home.    Baseline stand dynamic without UE support x 1 minute    Time 6    Period Weeks    Status New      Additional Long Term Goals   Additional Long Term Goals Yes      PT LONG TERM GOAL #6   Title Pt will be able to ambulate x 500 over level and unlevel surfaces w/ LRAD and B AFO's to demo improved safe community  mobility    Baseline 100 ft indoors with RW    Time 6    Period Weeks    Status New                   Plan - 03/02/21 1108     Clinical Impression Statement Skilled session focused on formal assesment of balance with BERG balance test with score of 43/56 indicative of significant fall risk, however we were unable to do this test at all prior to him getting IVIG due to significant deficits.  Also initiated HEP today with sit<>stand, forward marching and side stepping squats.  Tolerated well and will continue to benefit from skilled therapy to address remaining deficits.    Personal Factors and Comorbidities Comorbidity 2    Comorbidities Chronic inflammatory demyelinating polyneuropathy, Uncontrolled DM Type 1, ADHD    Examination-Activity Limitations Dressing;Transfers;Stairs;Stand;Locomotion Level;Bend;Lift    Examination-Participation Restrictions Community Activity;Cleaning;Laundry;Meal Prep    Stability/Clinical Decision Making Evolving/Moderate complexity    Rehab Potential Good    PT Frequency 2x / week    PT Duration 6 weeks    PT Treatment/Interventions ADLs/Self Care Home Management;Aquatic Therapy;Cryotherapy;Moist Heat;DME Instruction;Gait training;Stair training;Functional mobility training;Therapeutic activities;Therapeutic exercise;Balance training;Neuromuscular re-education;Patient/family education;Orthotic Fit/Training;Manual techniques;Passive range of motion    PT Next Visit Plan Continue to add to HEP for balance, gait with less restrictive device, gait outdoors    Consulted and Agree with Plan of Care Patient             Patient will benefit from skilled therapeutic intervention in order to improve the following deficits and impairments:  Abnormal gait, Decreased balance, Difficulty walking, Impaired sensation, Decreased strength, Decreased activity tolerance, Impaired UE functional use, Decreased endurance, Decreased knowledge of precautions, Decreased  knowledge of use of DME, Impaired perceived functional ability  Visit Diagnosis: Muscle weakness (generalized)  Other disturbances of skin sensation  Unsteadiness on feet  Other abnormalities of gait and mobility     Problem List Patient Active Problem List   Diagnosis Date Noted   Proteinuria 11/23/2020   CIDP (chronic inflammatory demyelinating polyneuropathy) ([place]) 05/25/2020   Noncompliance with medication treatment due to difficulty with dosing 05/25/2020   Mixed hyperlipidemia 05/25/2020   Depression 05/25/2020   Diabetic polyneuropathy associated with type 1 diabetes mellitus ([place]) 02/26/2019   Postprandial bloating 02/25/2019   Sensory problems with limbs 02/25/2019   Uncontrolled type 1 diabetes mellitus with hyperglycemia ([place]) 05/24/2017   Morbid obesity ([place]) 05/24/2017   Acanthosis 05/24/2017    Cameron Sprang, PT, MPT [facility] [address] [place], [place], [address] Phone: [phone]   Fax:  [phone] 03/02/21, 11:19 AM   Name: Lucas Cunningham MRN: [id number] Date of Birth: 12/20/2000

## 2021-03-08 ENCOUNTER — Other Ambulatory Visit: Payer: Self-pay

## 2021-03-08 ENCOUNTER — Other Ambulatory Visit: Payer: Self-pay | Admitting: *Deleted

## 2021-03-08 NOTE — Patient Outreach (Signed)
Care Coordination  03/08/2021  Lucas Cunningham 09-Sep-2000 [id number]  RNCM had a successful outreach with Lucas Cunningham today. RNCM explained to patient that he was no longer showing as a [employer]. Explained to patient to call [employer](number on card) to inquire about needed information. Lucas Cunningham voiced understanding and stated that he would get off the phone and follow up. RNCM will follow up with patient next week to verify coverage.  Lucas Emms RN, BSN [place]   [employer]

## 2021-03-09 ENCOUNTER — Other Ambulatory Visit: Payer: Self-pay

## 2021-03-09 ENCOUNTER — Encounter: Payer: Self-pay | Admitting: Physical Therapy

## 2021-03-09 ENCOUNTER — Ambulatory Visit: Payer: Medicaid Other | Admitting: Physical Therapy

## 2021-03-09 ENCOUNTER — Encounter: Payer: Self-pay | Admitting: Occupational Therapy

## 2021-03-09 ENCOUNTER — Ambulatory Visit: Payer: Medicaid Other | Admitting: Occupational Therapy

## 2021-03-09 DIAGNOSIS — R29818 Other symptoms and signs involving the nervous system: Secondary | ICD-10-CM

## 2021-03-09 DIAGNOSIS — R208 Other disturbances of skin sensation: Secondary | ICD-10-CM

## 2021-03-09 DIAGNOSIS — R278 Other lack of coordination: Secondary | ICD-10-CM

## 2021-03-09 DIAGNOSIS — R2681 Unsteadiness on feet: Secondary | ICD-10-CM

## 2021-03-09 DIAGNOSIS — R2689 Other abnormalities of gait and mobility: Secondary | ICD-10-CM

## 2021-03-09 DIAGNOSIS — M25531 Pain in right wrist: Secondary | ICD-10-CM

## 2021-03-09 DIAGNOSIS — M6281 Muscle weakness (generalized): Secondary | ICD-10-CM

## 2021-03-09 NOTE — Therapy (Signed)
[facility] [address] [place], [place], [address] Phone: [phone]   Fax:  [phone]  Occupational Therapy Treatment  Patient Details  Name: Lucas Cunningham MRN: [id number] Date of Birth: 04/19/2000 Referring Provider (OT): Delfin Edis   Encounter Date: 03/09/2021   OT End of Session - 03/09/21 0955     Visit Number 3    Number of Visits 17    Date for OT Re-Evaluation 04/10/21    Authorization Type [employer] (Under 21)    Authorization Time Period Medicaid - 6 visits approved 03-02-21 - 05/13/21    Authorization - Visit Number 3    Authorization - Number of Visits 6    Progress Note Due on Visit 10    OT Start Time 0930    OT Stop Time 1015    OT Time Calculation (min) 45 min    Activity Tolerance Patient tolerated treatment well    Behavior During Therapy WFL for tasks assessed/performed             Past Medical History:  Diagnosis Date   CIDP (chronic inflammatory demyelinating polyneuropathy) ([place])    Diabetes mellitus without complication ([place])    type 1    Past Surgical History:  Procedure Laterality Date   NO PAST SURGERIES      There were no vitals filed for this visit.   Subjective Assessment - 03/09/21 0933     Subjective  "i put my socks on"    Currently in Pain? No/denies                          OT Treatments/Exercises (OP) - 03/09/21 0945       ADLs   LB Dressing pt reports "i put my socks on" today and reported increased ease and not as difficulty as expected      Fine Motor Coordination (Hand/Wrist)   Fine Motor Coordination Handwriting;Manipulation of small objects    Manipulation of small objects semi circle pegboard with white and red dowels with LUE with emphasis and verbal cues for wrist extension. Pt completed gray dowels with LUE with mod/max difficulty.    Handwriting writing name with foam grip on pen with legibility and lower case  "l"'s with working on control and dynamic grasp. Tracing ABC's and shapes with coban around highlighter with good control and minimal deviation. Pt worked on drawing as that was a previous hobby of his with sketchbook and built up pencil with coban and foam grip. Pt very interested in continuing to learn to draw again with R, dominant, UE.                      OT Short Term Goals - 03/09/21 0953       OT SHORT TERM GOAL #1   Title Patient will demonstrate ability to put on and take off his socks with mod assist    Baseline Dependent    Time 4    Period Weeks    Status On-going   03/09/21 reported donning socks independently   Target Date 04/10/21      OT SHORT TERM GOAL #2   Title Patient will demonstrate ability to pull up zipper once zipper closed, or Pull down zipper on jacket with min assist    Baseline Dependent    Time 4    Period Weeks    Status On-going      OT SHORT TERM  GOAL #3   Title Patient will demonstrate ability to legibly print a sentence with dominant right hand and built up grip    Baseline Able to print his name 75% legibility    Time 4    Period Weeks    Status On-going      OT SHORT TERM GOAL #4   Title Patient will demonstrate ability to type on computer, or navigate controls for gaming with set up and increased time    Baseline Unable to type, use computer consistently    Time 4    Period Weeks    Status New      OT SHORT TERM GOAL #5   Title xxx    Baseline xxx               OT Long Term Goals - 02/24/21 1332       OT LONG TERM GOAL #1   Title Patient will don/doff socks with modified independence    Baseline dependent    Time 8    Period Weeks    Status New    Target Date 05/10/21      OT LONG TERM GOAL #2   Title .Patient will perform simple cooking on stovetop with supervision and intermittent assistance    Baseline Is not completing  - dependent on grandmother    Time 8    Period Weeks    Status New      OT LONG  TERM GOAL #3   Title Patient will improve grip strength to allow him to open a chip bag with his hands    Baseline Uses his teeth to tear open packages    Time 8    Period Weeks      OT LONG TERM GOAL #4   Title Patient will demonstrate improved fine motor control such that he can complete the 9 hole peg test in less than 2 min with RUE    Baseline RUE - Just under 1 min for 3 pegs    Time 8    Period Weeks    Status New      OT LONG TERM GOAL #5   Title Patient will demonstrate 75% active wrist extension against gravity in LUE to help with grip strength    Baseline Cannot extend wrist against gravity    Time 8    Period Weeks    Status New      OT LONG TERM GOAL #6   Title Patient will demobnstrate a 5 lb increase in grip strength in BUE    Baseline R 26, L 4    Time 8    Period Weeks    Status New      OT LONG TERM GOAL #7   Title Pt will be compliant and verbalize understanding of wear and care instructions for any splints and/or orthoses PRN    Baseline Patient's custom splint was damaged and is no longer useable    Time 8    Period Weeks    Status New                   Plan - 03/09/21 0956     Clinical Impression Statement Pt continues t progress with goals - pt with increased independence and participation in ADLs and increased coordination with manipulation of writing utensils and control.    OT Occupational Profile and History Detailed Assessment- Review of Records and additional review of physical, cognitive, psychosocial history related to current functional  performance    Occupational performance deficits (Please refer to evaluation for details): ADL's;IADL's    Body Structure / Function / Physical Skills ADL;Decreased knowledge of use of DME;Strength;Balance;Dexterity;GMC;Tone;UE functional use;Body mechanics;Endurance;IADL;ROM;Sensation;Mobility;Flexibility;Coordination;Decreased knowledge of precautions;FMC    Rehab Potential Good    Clinical Decision  Making Several treatment options, min-mod task modification necessary    Comorbidities Affecting Occupational Performance: May have comorbidities impacting occupational performance    Modification or Assistance to Complete Evaluation  No modification of tasks or assist necessary to complete eval    OT Frequency 2x / week    OT Duration 8 weeks    OT Treatment/Interventions Self-care/ADL training;Aquatic Therapy;DME and/or AE instruction;Splinting;Balance training;Therapeutic activities;Therapeutic exercise;Passive range of motion;Functional Mobility Training;Neuromuscular education;Electrical Stimulation;Manual Therapy;Patient/family education    Plan Review HEP, and prior splints, Needs updated HEP - functional UE use    Consulted and Agree with Plan of Care Patient             Patient will benefit from skilled therapeutic intervention in order to improve the following deficits and impairments:   Body Structure / Function / Physical Skills: ADL, Decreased knowledge of use of DME, Strength, Balance, Dexterity, GMC, Tone, UE functional use, Body mechanics, Endurance, IADL, ROM, Sensation, Mobility, Flexibility, Coordination, Decreased knowledge of precautions, [facility]       Visit Diagnosis: Muscle weakness (generalized)  Other lack of coordination  Unsteadiness on feet  Pain in right wrist  Other abnormalities of gait and mobility  Other symptoms and signs involving the nervous system    Problem List Patient Active Problem List   Diagnosis Date Noted   Proteinuria 11/23/2020   CIDP (chronic inflammatory demyelinating polyneuropathy) ([place]) 05/25/2020   Noncompliance with medication treatment due to difficulty with dosing 05/25/2020   Mixed hyperlipidemia 05/25/2020   Depression 05/25/2020   Diabetic polyneuropathy associated with type 1 diabetes mellitus ([place]) 02/26/2019   Postprandial bloating 02/25/2019   Sensory problems with limbs 02/25/2019   Uncontrolled type 1 diabetes  mellitus with hyperglycemia ([place]) 05/24/2017   Morbid obesity ([place]) 05/24/2017   Acanthosis 05/24/2017    Zachery Conch, OT 03/09/2021, 10:16 AM  [facility] [address] [place], [place], [address] Phone: [phone]   Fax:  [phone]  Name: Lucas Cunningham MRN: [id number] Date of Birth: [birth year]

## 2021-03-09 NOTE — Therapy (Signed)
[facility] [address] [place], [place], [address] Phone: [phone]   Fax:  [phone]  Physical Therapy Treatment  Patient Details  Name: Lucas Cunningham MRN: [id number] Date of Birth: 2000-10-21 Referring Provider (PT): Sarina Ill, MD   Encounter Date: 03/09/2021   PT End of Session - 03/09/21 1018     Visit Number 3    Number of Visits 13    Date for PT Re-Evaluation 04/08/21    Authorization Type [employer]    Authorization Time Period 12 visits 03/02/21-04/13/21    Authorization - Visit Number 2    Authorization - Number of Visits 12    PT Start Time [id number]    PT Stop Time 1058    PT Time Calculation (min) 43 min    Equipment Utilized During Treatment Gait belt    Activity Tolerance Patient tolerated treatment well;No increased pain    Behavior During Therapy WFL for tasks assessed/performed             Past Medical History:  Diagnosis Date   CIDP (chronic inflammatory demyelinating polyneuropathy) (HCC)    Diabetes mellitus without complication ([place])    type 1    Past Surgical History:  Procedure Laterality Date   NO PAST SURGERIES      There were no vitals filed for this visit.   Subjective Assessment - 03/09/21 1017     Subjective No new complaints. No falls or pain to report. HEP is going well, doing more laps at counter that listed as his counter is shorter than the one here.    Pertinent History Chronic inflammatory demyelinating polyneuropathy, Uncontrolled DM Type 1, ADHD    Limitations Standing;Walking;House hold activities    Diagnostic tests MRI c-spine and L-spine unremarkable    Patient Stated Goals Improve balance with Standing; Get Stronger.    Currently in Pain? No/denies                      [facility] Adult PT Treatment/Exercise - 03/09/21 1020       Transfers   Transfers Sit to Stand;Stand to Sit    Sit to Stand 5: Supervision;With upper extremity assist;From  bed;From chair/3-in-1    Stand to Sit 5: Supervision;With upper extremity assist;To bed;To chair/3-in-1      Ambulation/Gait   Ambulation/Gait Yes    Ambulation/Gait Assistance 5: Supervision    Ambulation Distance (Feet) --   around clinic with session   Assistive device Rolling walker    Gait Pattern Step-through pattern;Decreased stride length;Decreased dorsiflexion - right;Decreased dorsiflexion - left;Wide base of support    Ambulation Surface Level;Indoor      Exercises   Exercises Other Exercises    Other Exercises  seated at edge of mat: sit<>stands with feet in staggered stance for 10 reps each foot forward with min guard assist for safety, UE assit with each rep.      Knee/Hip Exercises: Aerobic   Other Aerobic Scifit UE/LE's level 3.5 x 8 mintues with goal >/= 80 steps per minute for strengthening and activity tolerance      Knee/Hip Exercises: Standing   Lateral Step Up Both;1 set;10 reps;Hand Hold: 2;Step Height: 4";Limitations    Lateral Step Up Limitations with contralateral march from stance leg with cues for decreased UE reliance    Forward Step Up Both;1 set;10 reps;Hand Hold: 2;Step Height: 4";Limitations    Forward Step Up Limitations with contralateral march with emphasis on decreased UE support  Balance Exercises - 03/09/21 1528       Balance Exercises: Standing   Rockerboard Anterior/posterior;Lateral;EO;EC;30 seconds;Other reps (comment);Intermittent UE support;UE support;Limitations    Rockerboard Limitations performed both ways on balance board while trying to hold the board steady: alternating UE raises, progressing to bil UE raises with min guard to min assist, cues on posture/weight shifting for balance assitance; then with right UE support only for EC 30 sec's x 3 reps with cues on posture/weight shifting to assist with balance.                  PT Short Term Goals - 02/24/21 1059       PT SHORT TERM GOAL #1   Title Pt  will be IND with initial HEP to demo improved function and balance (ALL STGs Due: 03/18/21)    Baseline no HEP established    Time 3    Period Weeks    Status New    Target Date 03/18/21      PT SHORT TERM GOAL #2   Title Pt will be able to stand statically without support at [facility] for >/= 1 min 30 seconds with supervision in order to indicate improved balance and strength.    Baseline 1 min, 12 seconds    Time 3    Period Weeks    Status New      PT SHORT TERM GOAL #3   Title Pt will improve gait speed to >/= 2.5 ft/sec w/ LRAD and B AFO as needed to indicate dec fall risk.    Baseline 2.35 ft/sec w/ B AFOs and RW    Time 3    Period Weeks    Status New      PT SHORT TERM GOAL #4   Title Pt will improve 5TSS to </= 20 secs with BUE support in order to indicate improved strength.    Baseline 22.62 seconds    Time 3    Period Weeks    Status New               PT Long Term Goals - 02/24/21 1101       PT LONG TERM GOAL #1   Title Pt will be IND with final HEP in order to indicate improved functional mobility and dec fall risk.  ALL LTGS DUE 04/08/21    Baseline no HEP established    Time 6    Period Weeks    Status New    Target Date 04/08/21      PT LONG TERM GOAL #2   Title Pt will improve gait speed to >/=2.70 ft/sec with LRAD and B AFOs as needed in order to indicate safe community ambulation.    Baseline 2.35 ft/sec    Time 6    Period Weeks    Status New      PT LONG TERM GOAL #3   Title Pt will improve 5TSS to </=17 secs with single UE support only in order to indicate improved functional strength.    Baseline 22.62 secs    Time 6    Period Weeks    Status New      PT LONG TERM GOAL #4   Title Pt will improve TUG to </= 15 seconds with LRAD to demo improved balance and reduced fall risk    Baseline 18.57 secs    Time 6    Period Weeks    Status New      PT LONG TERM GOAL #5  Title Pt will perform dynamic standing balance without UE support x 2 mins  in order to perform ADLs safely at home.    Baseline stand dynamic without UE support x 1 minute    Time 6    Period Weeks    Status New      Additional Long Term Goals   Additional Long Term Goals Yes      PT LONG TERM GOAL #6   Title Pt will be able to ambulate x 500 over level and unlevel surfaces w/ LRAD and B AFO's to demo improved safe community mobility    Baseline 100 ft indoors with RW    Time 6    Period Weeks    Status New                   Plan - 03/09/21 1019     Clinical Impression Statement Today's skilled session continued to focus on strengthening and balance training with no issues noted or reported in session. Pt able to briefly hold balance on compliant surfaces with no UE support this session. The pt is making steady progress toward goals and should benefit from continued PT to progress toward unmet goals.    Personal Factors and Comorbidities Comorbidity 2    Comorbidities Chronic inflammatory demyelinating polyneuropathy, Uncontrolled DM Type 1, ADHD    Examination-Activity Limitations Dressing;Transfers;Stairs;Stand;Locomotion Level;Bend;Lift    Examination-Participation Restrictions Community Activity;Cleaning;Laundry;Meal Prep    Stability/Clinical Decision Making Evolving/Moderate complexity    Rehab Potential Good    PT Frequency 2x / week    PT Duration 6 weeks    PT Treatment/Interventions ADLs/Self Care Home Management;Aquatic Therapy;Cryotherapy;Moist Heat;DME Instruction;Gait training;Stair training;Functional mobility training;Therapeutic activities;Therapeutic exercise;Balance training;Neuromuscular re-education;Patient/family education;Orthotic Fit/Training;Manual techniques;Passive range of motion    PT Next Visit Plan Continue to add to HEP for balance, gait with less restrictive device, gait outdoors, balance on compliant surfaces    PT Home Exercise Plan [id number]    Consulted and Agree with Plan of Care Patient              Patient will benefit from skilled therapeutic intervention in order to improve the following deficits and impairments:  Abnormal gait, Decreased balance, Difficulty walking, Impaired sensation, Decreased strength, Decreased activity tolerance, Impaired UE functional use, Decreased endurance, Decreased knowledge of precautions, Decreased knowledge of use of DME, Impaired perceived functional ability  Visit Diagnosis: Muscle weakness (generalized)  Unsteadiness on feet  Other abnormalities of gait and mobility  Other disturbances of skin sensation     Problem List Patient Active Problem List   Diagnosis Date Noted   Proteinuria 11/23/2020   CIDP (chronic inflammatory demyelinating polyneuropathy) ([place]) 05/25/2020   Noncompliance with medication treatment due to difficulty with dosing 05/25/2020   Mixed hyperlipidemia 05/25/2020   Depression 05/25/2020   Diabetic polyneuropathy associated with type 1 diabetes mellitus ([place]) 02/26/2019   Postprandial bloating 02/25/2019   Sensory problems with limbs 02/25/2019   Uncontrolled type 1 diabetes mellitus with hyperglycemia ([place]) 05/24/2017   Morbid obesity ([place]) 05/24/2017   Acanthosis 05/24/2017    Willow Ora, PTA, [facility] [address] [place], [place] [address] [phone] 03/09/21, 3:33 PM   Name: Lucas Cunningham MRN: [id number] Date of Birth: [birth year]

## 2021-03-14 ENCOUNTER — Other Ambulatory Visit: Payer: Self-pay

## 2021-03-14 ENCOUNTER — Encounter: Payer: Self-pay | Admitting: Occupational Therapy

## 2021-03-14 ENCOUNTER — Ambulatory Visit: Payer: Medicaid Other

## 2021-03-14 ENCOUNTER — Ambulatory Visit: Payer: Medicaid Other | Admitting: Occupational Therapy

## 2021-03-14 DIAGNOSIS — M6281 Muscle weakness (generalized): Secondary | ICD-10-CM

## 2021-03-14 DIAGNOSIS — R29818 Other symptoms and signs involving the nervous system: Secondary | ICD-10-CM

## 2021-03-14 DIAGNOSIS — R2689 Other abnormalities of gait and mobility: Secondary | ICD-10-CM

## 2021-03-14 DIAGNOSIS — R2681 Unsteadiness on feet: Secondary | ICD-10-CM | POA: Diagnosis not present

## 2021-03-14 DIAGNOSIS — M25531 Pain in right wrist: Secondary | ICD-10-CM

## 2021-03-14 DIAGNOSIS — R278 Other lack of coordination: Secondary | ICD-10-CM

## 2021-03-14 DIAGNOSIS — R208 Other disturbances of skin sensation: Secondary | ICD-10-CM

## 2021-03-14 NOTE — Therapy (Signed)
[facility] [address] [place], [place], [address] Phone: [phone]   Fax:  [phone]  Occupational Therapy Treatment  Patient Details  Name: Lucas Cunningham MRN: [id number] Date of Birth: 30-Aug-2000 Referring Provider (OT): Delfin Edis   Encounter Date: 03/14/2021   OT End of Session - 03/14/21 1417     Visit Number 4    Number of Visits 17    Date for OT Re-Evaluation 04/10/21    Authorization Type [employer] (Under 21)    Authorization Time Period Medicaid - 6 visits approved 03-02-21 - 05/13/21    Authorization - Visit Number 4    Authorization - Number of Visits 6    Progress Note Due on Visit 10    OT Start Time [id number]   arrival time   OT Stop Time 1445    OT Time Calculation (min) 30 min    Activity Tolerance Patient tolerated treatment well    Behavior During Therapy WFL for tasks assessed/performed             Past Medical History:  Diagnosis Date   CIDP (chronic inflammatory demyelinating polyneuropathy) ([place])    Diabetes mellitus without complication ([place])    type 1    Past Surgical History:  Procedure Laterality Date   NO PAST SURGERIES      There were no vitals filed for this visit.   Subjective Assessment - 03/14/21 1416     Subjective  Pt denies any changes or pain.    Currently in Pain? No/denies                          OT Treatments/Exercises (OP) - 03/14/21 0001       Fine Motor Coordination (Hand/Wrist)   Small Pegboard MEDIUM pegs - with LUE with mod/max difficulty with coordination with max drops and with copying pattern without difficulty. removed with in hand manipulation with mod drops with RUE                      OT Short Term Goals - 03/09/21 0953       OT SHORT TERM GOAL #1   Title Patient will demonstrate ability to put on and take off his socks with mod assist    Baseline Dependent    Time 4    Period Weeks     Status On-going   03/09/21 reported donning socks independently   Target Date 04/10/21      OT SHORT TERM GOAL #2   Title Patient will demonstrate ability to pull up zipper once zipper closed, or Pull down zipper on jacket with min assist    Baseline Dependent    Time 4    Period Weeks    Status On-going      OT SHORT TERM GOAL #3   Title Patient will demonstrate ability to legibly print a sentence with dominant right hand and built up grip    Baseline Able to print his name 75% legibility    Time 4    Period Weeks    Status On-going      OT SHORT TERM GOAL #4   Title Patient will demonstrate ability to type on computer, or navigate controls for gaming with set up and increased time    Baseline Unable to type, use computer consistently    Time 4    Period Weeks    Status New  OT SHORT TERM GOAL #5   Title xxx    Baseline xxx               OT Long Term Goals - 02/24/21 1332       OT LONG TERM GOAL #1   Title Patient will don/doff socks with modified independence    Baseline dependent    Time 8    Period Weeks    Status New    Target Date 05/10/21      OT LONG TERM GOAL #2   Title .Patient will perform simple cooking on stovetop with supervision and intermittent assistance    Baseline Is not completing  - dependent on grandmother    Time 8    Period Weeks    Status New      OT LONG TERM GOAL #3   Title Patient will improve grip strength to allow him to open a chip bag with his hands    Baseline Uses his teeth to tear open packages    Time 8    Period Weeks      OT LONG TERM GOAL #4   Title Patient will demonstrate improved fine motor control such that he can complete the 9 hole peg test in less than 2 min with RUE    Baseline RUE - Just under 1 min for 3 pegs    Time 8    Period Weeks    Status New      OT LONG TERM GOAL #5   Title Patient will demonstrate 75% active wrist extension against gravity in LUE to help with grip strength    Baseline  Cannot extend wrist against gravity    Time 8    Period Weeks    Status New      OT LONG TERM GOAL #6   Title Patient will demobnstrate a 5 lb increase in grip strength in BUE    Baseline R 26, L 4    Time 8    Period Weeks    Status New      OT LONG TERM GOAL #7   Title Pt will be compliant and verbalize understanding of wear and care instructions for any splints and/or orthoses PRN    Baseline Patient's custom splint was damaged and is no longer useable    Time 8    Period Weeks    Status New                   Plan - 03/14/21 1445     Clinical Impression Statement pt reports increased independence with ADLs. Pt demonstrates improved coordination but continue sto demonstrate deficits iwth in hand manipulation and coordination with BUE.    OT Occupational Profile and History Detailed Assessment- Review of Records and additional review of physical, cognitive, psychosocial history related to current functional performance    Occupational performance deficits (Please refer to evaluation for details): ADL's;IADL's    Body Structure / Function / Physical Skills ADL;Decreased knowledge of use of DME;Strength;Balance;Dexterity;GMC;Tone;UE functional use;Body mechanics;Endurance;IADL;ROM;Sensation;Mobility;Flexibility;Coordination;Decreased knowledge of precautions;FMC    Rehab Potential Good    Clinical Decision Making Several treatment options, min-mod task modification necessary    Comorbidities Affecting Occupational Performance: May have comorbidities impacting occupational performance    Modification or Assistance to Complete Evaluation  No modification of tasks or assist necessary to complete eval    OT Frequency 2x / week    OT Duration 8 weeks    OT Treatment/Interventions Self-care/ADL training;Aquatic Therapy;DME and/or  AE instruction;Splinting;Balance training;Therapeutic activities;Therapeutic exercise;Passive range of motion;Functional Mobility Training;Neuromuscular  education;Electrical Stimulation;Manual Therapy;Patient/family education    Plan Review HEP, and prior splints, Needs updated HEP - functional UE use    Consulted and Agree with Plan of Care Patient             Patient will benefit from skilled therapeutic intervention in order to improve the following deficits and impairments:   Body Structure / Function / Physical Skills: ADL, Decreased knowledge of use of DME, Strength, Balance, Dexterity, GMC, Tone, UE functional use, Body mechanics, Endurance, IADL, ROM, Sensation, Mobility, Flexibility, Coordination, Decreased knowledge of precautions, [facility]       Visit Diagnosis: Muscle weakness (generalized)  Unsteadiness on feet  Other lack of coordination  Pain in right wrist  Other abnormalities of gait and mobility  Other symptoms and signs involving the nervous system  Other disturbances of skin sensation    Problem List Patient Active Problem List   Diagnosis Date Noted   Proteinuria 11/23/2020   CIDP (chronic inflammatory demyelinating polyneuropathy) ([place]) 05/25/2020   Noncompliance with medication treatment due to difficulty with dosing 05/25/2020   Mixed hyperlipidemia 05/25/2020   Depression 05/25/2020   Diabetic polyneuropathy associated with type 1 diabetes mellitus ([place]) 02/26/2019   Postprandial bloating 02/25/2019   Sensory problems with limbs 02/25/2019   Uncontrolled type 1 diabetes mellitus with hyperglycemia ([place]) 05/24/2017   Morbid obesity ([place]) 05/24/2017   Acanthosis 05/24/2017    Zachery Conch, OT 03/14/2021, 2:45 PM  [facility] [address] [place], [place], [address] Phone: [phone]   Fax:  [phone]  Name: Jashua Wardrop MRN: [id number] Date of Birth: 25-Jul-2000

## 2021-03-14 NOTE — Therapy (Signed)
[facility] [address] [place], [place], [address] Phone: [phone]   Fax:  [phone]  Physical Therapy Treatment  Patient Details  Name: Lucas Cunningham MRN: [id number] Date of Birth: 11/30/2000 Referring Provider (PT): Naomie Dean, MD   Encounter Date: 03/14/2021   PT End of Session - 03/14/21 1451     Visit Number 4    Number of Visits 13    Date for PT Re-Evaluation 04/08/21    Authorization Type [employer]    Authorization Time Period 12 visits 03/02/21-04/13/21    Authorization - Visit Number 3    Authorization - Number of Visits 12    PT Start Time 1450    Equipment Utilized During Treatment Gait belt    Activity Tolerance Patient tolerated treatment well;No increased pain    Behavior During Therapy WFL for tasks assessed/performed             Past Medical History:  Diagnosis Date   CIDP (chronic inflammatory demyelinating polyneuropathy) (HCC)    Diabetes mellitus without complication (HCC)    type 1    Past Surgical History:  Procedure Laterality Date   NO PAST SURGERIES      There were no vitals filed for this visit.   Subjective Assessment - 03/14/21 1451     Subjective No new changes/complaints. No falls or pain to report.    Pertinent History Chronic inflammatory demyelinating polyneuropathy, Uncontrolled DM Type 1, ADHD    Limitations Standing;Walking;House hold activities    Diagnostic tests MRI c-spine and L-spine unremarkable    Patient Stated Goals Improve balance with Standing; Get Stronger.    Currently in Pain? No/denies             [facility] Adult PT Treatment/Exercise - 03/14/21 0001       Transfers   Transfers Sit to Stand;Stand to Sit    Sit to Stand 5: Supervision;With upper extremity assist;From bed;From chair/3-in-1    Stand to Sit 5: Supervision    Number of Reps 10 reps;2 sets;Other reps (comment)    Comments completed sit <> stands x 10 reps with UE support  and BLE placed on airex, then with second set completed on firm surface x 5 reps without UE support from mat, then progressed to lowest height of mat and completed addition x 5 reps. PT encouraging patient to continue to reduce use of UE to further strengthen BLE.      Ambulation/Gait   Ambulation/Gait Yes    Ambulation/Gait Assistance 5: Supervision    Ambulation/Gait Assistance Details ambulation into session with use of RW. Completed ambulation in session without AD x 230 ft with bil AFO's donned. Supervision level.    Ambulation Distance (Feet) 230 Feet    Assistive device None;Rolling walker    Gait Pattern Step-through pattern;Decreased stride length;Decreased dorsiflexion - right;Decreased dorsiflexion - left;Wide base of support    Ambulation Surface Level;Indoor      Exercises   Exercises Knee/Hip      Knee/Hip Exercises: Aerobic   Other Aerobic Scifit with BUE/BLE's level 4.0 x 8 mintues with goal >/= 80 steps per minute for strengthening and activity tolerance. Pt tolerate increase in resistance well.                 Balance Exercises - 03/14/21 0001       Balance Exercises: Standing   Standing, One Foot on a Step Eyes open;6 inch;3 reps;30 secs;Time;Limitations    Standing, One Foot on a  Step Time standing with opposite LE on 6" step holding balance with EO, 3 x 30 seconds each. incrased challenge with LLE posterior.    Rockerboard Anterior/posterior;Lateral;EO;EC;30 seconds;Other reps (comment);Intermittent UE support;UE support;Limitations    Rockerboard Limitations performed both ways on balance board while trying to hold the board steady: alternating UE raises x 10 reps with CGA. Then transitioned to EO with horizontal/vertical head turns x 10 reps, intermittent touch A and CGA for balance.    Other Standing Exercises standing on airex and without UE support: completed bean bag toss to basket 2 sets x 10 bean bags, intermitent touch A as needed. Close supervision.  All bags tossed with RUE.                PT Education - 03/14/21 1524     Education Details continue to trial sit <> stands without UE support    Person(s) Educated Patient    Methods Explanation;Demonstration    Comprehension Verbalized understanding;Returned demonstration              PT Short Term Goals - 02/24/21 1059       PT SHORT TERM GOAL #1   Title Pt will be IND with initial HEP to demo improved function and balance (ALL STGs Due: 03/18/21)    Baseline no HEP established    Time 3    Period Weeks    Status New    Target Date 03/18/21      PT SHORT TERM GOAL #2   Title Pt will be able to stand statically without support at [facility] for >/= 1 min 30 seconds with supervision in order to indicate improved balance and strength.    Baseline 1 min, 12 seconds    Time 3    Period Weeks    Status New      PT SHORT TERM GOAL #3   Title Pt will improve gait speed to >/= 2.5 ft/sec w/ LRAD and B AFO as needed to indicate dec fall risk.    Baseline 2.35 ft/sec w/ B AFOs and RW    Time 3    Period Weeks    Status New      PT SHORT TERM GOAL #4   Title Pt will improve 5TSS to </= 20 secs with BUE support in order to indicate improved strength.    Baseline 22.62 seconds    Time 3    Period Weeks    Status New               PT Long Term Goals - 02/24/21 1101       PT LONG TERM GOAL #1   Title Pt will be IND with final HEP in order to indicate improved functional mobility and dec fall risk.  ALL LTGS DUE 04/08/21    Baseline no HEP established    Time 6    Period Weeks    Status New    Target Date 04/08/21      PT LONG TERM GOAL #2   Title Pt will improve gait speed to >/=2.70 ft/sec with LRAD and B AFOs as needed in order to indicate safe community ambulation.    Baseline 2.35 ft/sec    Time 6    Period Weeks    Status New      PT LONG TERM GOAL #3   Title Pt will improve 5TSS to </=17 secs with single UE support only in order to indicate improved  functional strength.  Baseline 22.62 secs    Time 6    Period Weeks    Status New      PT LONG TERM GOAL #4   Title Pt will improve TUG to </= 15 seconds with LRAD to demo improved balance and reduced fall risk    Baseline 18.57 secs    Time 6    Period Weeks    Status New      PT LONG TERM GOAL #5   Title Pt will perform dynamic standing balance without UE support x 2 mins in order to perform ADLs safely at home.    Baseline stand dynamic without UE support x 1 minute    Time 6    Period Weeks    Status New      Additional Long Term Goals   Additional Long Term Goals Yes      PT LONG TERM GOAL #6   Title Pt will be able to ambulate x 500 over level and unlevel surfaces w/ LRAD and B AFO's to demo improved safe community mobility    Baseline 100 ft indoors with RW    Time 6    Period Weeks    Status New                   Plan - 03/14/21 1525     Clinical Impression Statement Continued today's session focused on strengthening and static/dynamic standing balance with most challenge noted on complaint/unlevel surfaces, requiring intermittent UE support. Trialed gait without AD during session x 230 ft. With patient supervision level, no assistance required but most limited by fatigue. Will benefit from trial of LRAD for longer distance ambulation. Will continue per POC.    Personal Factors and Comorbidities Comorbidity 2    Comorbidities Chronic inflammatory demyelinating polyneuropathy, Uncontrolled DM Type 1, ADHD    Examination-Activity Limitations Dressing;Transfers;Stairs;Stand;Locomotion Level;Bend;Lift    Examination-Participation Restrictions Community Activity;Cleaning;Laundry;Meal Prep    Stability/Clinical Decision Making Evolving/Moderate complexity    Rehab Potential Good    PT Frequency 2x / week    PT Duration 6 weeks    PT Treatment/Interventions ADLs/Self Care Home Management;Aquatic Therapy;Cryotherapy;Moist Heat;DME Instruction;Gait training;Stair  training;Functional mobility training;Therapeutic activities;Therapeutic exercise;Balance training;Neuromuscular re-education;Patient/family education;Orthotic Fit/Training;Manual techniques;Passive range of motion    PT Next Visit Plan Trial cane for longer distance vs. RW. Continue to add to HEP for balance, gait with less restrictive device, gait outdoors, balance on compliant surfaces    PT Home Exercise Plan [id number]    Consulted and Agree with Plan of Care Patient             Patient will benefit from skilled therapeutic intervention in order to improve the following deficits and impairments:  Abnormal gait, Decreased balance, Difficulty walking, Impaired sensation, Decreased strength, Decreased activity tolerance, Impaired UE functional use, Decreased endurance, Decreased knowledge of precautions, Decreased knowledge of use of DME, Impaired perceived functional ability  Visit Diagnosis: Muscle weakness (generalized)  Other abnormalities of gait and mobility  Unsteadiness on feet     Problem List Patient Active Problem List   Diagnosis Date Noted   Proteinuria 11/23/2020   CIDP (chronic inflammatory demyelinating polyneuropathy) ([place]) 05/25/2020   Noncompliance with medication treatment due to difficulty with dosing 05/25/2020   Mixed hyperlipidemia 05/25/2020   Depression 05/25/2020   Diabetic polyneuropathy associated with type 1 diabetes mellitus ([place]) 02/26/2019   Postprandial bloating 02/25/2019   Sensory problems with limbs 02/25/2019   Uncontrolled type 1 diabetes mellitus with hyperglycemia ([place]) 05/24/2017  Morbid obesity ([place]) 05/24/2017   Acanthosis 05/24/2017    Jones Bales, PT, DPT 03/14/2021, 3:27 PM  [facility] [address] [place], [place], [address] Phone: [phone]   Fax:  [phone]  Name: Lucas Cunningham MRN: [id number] Date of Birth: 2001-01-23

## 2021-03-16 ENCOUNTER — Encounter: Payer: Self-pay | Admitting: Occupational Therapy

## 2021-03-16 ENCOUNTER — Ambulatory Visit: Payer: Medicaid Other | Admitting: Occupational Therapy

## 2021-03-16 ENCOUNTER — Ambulatory Visit: Payer: Medicaid Other | Attending: Internal Medicine

## 2021-03-16 ENCOUNTER — Other Ambulatory Visit: Payer: Self-pay

## 2021-03-16 DIAGNOSIS — R278 Other lack of coordination: Secondary | ICD-10-CM | POA: Insufficient documentation

## 2021-03-16 DIAGNOSIS — R2689 Other abnormalities of gait and mobility: Secondary | ICD-10-CM

## 2021-03-16 DIAGNOSIS — M6281 Muscle weakness (generalized): Secondary | ICD-10-CM | POA: Diagnosis not present

## 2021-03-16 DIAGNOSIS — R208 Other disturbances of skin sensation: Secondary | ICD-10-CM

## 2021-03-16 DIAGNOSIS — R29818 Other symptoms and signs involving the nervous system: Secondary | ICD-10-CM

## 2021-03-16 DIAGNOSIS — R2681 Unsteadiness on feet: Secondary | ICD-10-CM | POA: Diagnosis present

## 2021-03-16 DIAGNOSIS — M25531 Pain in right wrist: Secondary | ICD-10-CM | POA: Insufficient documentation

## 2021-03-16 NOTE — Therapy (Signed)
[facility] [address] [place], [place], [address] Phone: [phone]   Fax:  [phone]  Physical Therapy Treatment  Patient Details  Name: Lucas Cunningham MRN: [id number] Date of Birth: 06/29/2000 Referring Provider (PT): Sarina Ill, MD   Encounter Date: 03/16/2021   PT End of Session - 03/16/21 1058     Visit Number 5    Number of Visits 13    Date for PT Re-Evaluation 04/08/21    Authorization Type [employer]    Authorization Time Period 12 visits 03/02/21-04/13/21    Authorization - Visit Number 4    Authorization - Number of Visits 12    PT Start Time [id number]    PT Stop Time 1142    PT Time Calculation (min) 44 min    Equipment Utilized During Treatment Gait belt    Activity Tolerance Patient tolerated treatment well;No increased pain    Behavior During Therapy WFL for tasks assessed/performed             Past Medical History:  Diagnosis Date   CIDP (chronic inflammatory demyelinating polyneuropathy) (HCC)    Diabetes mellitus without complication ([place])    type 1    Past Surgical History:  Procedure Laterality Date   NO PAST SURGERIES      There were no vitals filed for this visit.   Subjective Assessment - 03/16/21 1058     Subjective No new changes. Reports legs felt like noodles after last session, but improved with rest. Felt wobbly. No falls.    Pertinent History Chronic inflammatory demyelinating polyneuropathy, Uncontrolled DM Type 1, ADHD    Limitations Standing;Walking;House hold activities    Diagnostic tests MRI c-spine and L-spine unremarkable    Patient Stated Goals Improve balance with Standing; Get Stronger.    Currently in Pain? No/denies                               [facility] Adult PT Treatment/Exercise - 03/16/21 0001       Transfers   Transfers Sit to Stand;Stand to Sit    Sit to Stand 5: Supervision    Stand to Sit 5: Supervision       Ambulation/Gait   Ambulation/Gait Yes    Ambulation/Gait Assistance 5: Supervision    Ambulation/Gait Assistance Details continue to use RW into/out of session. no AD during session. Completed ambulation x 545 ft indoors without AD, no signs of imbalance. Supervision level. Bil AFOs donned.PT providing education to donn AFO in morning, and trialing wearing throughout day for improved safety with ambulation especially when ambulating without an AD.    Ambulation Distance (Feet) 545 Feet    Assistive device None;Rolling walker    Gait Pattern Step-through pattern;Decreased stride length;Decreased dorsiflexion - right;Decreased dorsiflexion - left;Wide base of support    Ambulation Surface Level;Indoor      Exercises   Exercises Knee/Hip      Knee/Hip Exercises: Aerobic   Other Aerobic Scifit with BUE/BLE's level 4.0 x 6 mintues with goal >/= 80 steps per minute for strengthening and activity tolerance.                 Balance Exercises - 03/16/21 0001       Balance Exercises: Standing   Standing Eyes Opened Wide (BOA);Foam/compliant surface;Head turns;Limitations    Standing Eyes Opened Limitations with wide BOS on airex standing static stance with EO x 30 seocnds, then added in  horizontal/vertical head turns 2 x 5 reps. increased challenge toward end due to fatigue in BLE's    Standing Eyes Closed Narrow base of support (BOS);Solid surface;2 reps;Time    Standing Eyes Closed Time 10-15 seconds, x 4 reps with intermittent touch A and CGA    SLS with Vectors Solid surface;Intermittent upper extremity assist;Limitations    SLS with Vectors Limitations standing on firm surface completed alternating toe taps to 6" step x 15 reps bilat, progressing from single UE support > no UE support    Step Ups Forward;6 inch;UE support 1;Limitations    Step Ups Limitations completed forward steps up onto 6" step with bilat UE support then progressing to single UE support completed x 10 reps  alteranting LE.    Step Over Hurdles / Cones in // bars: completd forward ambulation with reciprocal stepping over hurdles x 2 laps with single UE support, then progressed to no UE support with step to pattern over hurdles x 4 laps down and back. Then transitioned to lateral side stepping able to complete with intermittent UE support x 3 laps down and back.                PT Education - 03/16/21 1139     Education Details importnace of wearing AFO's throughout the entire day (See gait section)    Person(s) Educated Patient    Methods Explanation    Comprehension Verbalized understanding              PT Short Term Goals - 02/24/21 1059       PT SHORT TERM GOAL #1   Title Pt will be IND with initial HEP to demo improved function and balance (ALL STGs Due: 03/18/21)    Baseline no HEP established    Time 3    Period Weeks    Status New    Target Date 03/18/21      PT SHORT TERM GOAL #2   Title Pt will be able to stand statically without support at [facility] for >/= 1 min 30 seconds with supervision in order to indicate improved balance and strength.    Baseline 1 min, 12 seconds    Time 3    Period Weeks    Status New      PT SHORT TERM GOAL #3   Title Pt will improve gait speed to >/= 2.5 ft/sec w/ LRAD and B AFO as needed to indicate dec fall risk.    Baseline 2.35 ft/sec w/ B AFOs and RW    Time 3    Period Weeks    Status New      PT SHORT TERM GOAL #4   Title Pt will improve 5TSS to </= 20 secs with BUE support in order to indicate improved strength.    Baseline 22.62 seconds    Time 3    Period Weeks    Status New               PT Long Term Goals - 02/24/21 1101       PT LONG TERM GOAL #1   Title Pt will be IND with final HEP in order to indicate improved functional mobility and dec fall risk.  ALL LTGS DUE 04/08/21    Baseline no HEP established    Time 6    Period Weeks    Status New    Target Date 04/08/21      PT LONG TERM GOAL #2   Title Pt  will  improve gait speed to >/=2.70 ft/sec with LRAD and B AFOs as needed in order to indicate safe community ambulation.    Baseline 2.35 ft/sec    Time 6    Period Weeks    Status New      PT LONG TERM GOAL #3   Title Pt will improve 5TSS to </=17 secs with single UE support only in order to indicate improved functional strength.    Baseline 22.62 secs    Time 6    Period Weeks    Status New      PT LONG TERM GOAL #4   Title Pt will improve TUG to </= 15 seconds with LRAD to demo improved balance and reduced fall risk    Baseline 18.57 secs    Time 6    Period Weeks    Status New      PT LONG TERM GOAL #5   Title Pt will perform dynamic standing balance without UE support x 2 mins in order to perform ADLs safely at home.    Baseline stand dynamic without UE support x 1 minute    Time 6    Period Weeks    Status New      Additional Long Term Goals   Additional Long Term Goals Yes      PT LONG TERM GOAL #6   Title Pt will be able to ambulate x 500 over level and unlevel surfaces w/ LRAD and B AFO's to demo improved safe community mobility    Baseline 100 ft indoors with RW    Time 6    Period Weeks    Status New                   Plan - 03/16/21 1137     Clinical Impression Statement Today's skilled PT session focused on continued gait activities without AD, as well as dynamic gait with reduced UE support to further challenge balance. Pt able to ambulate over 500 ft with supervision and no AD on level surfaces. Continue to demo increased challenge with obstacle negotiation and single leg stance activities. Will continue per POC.    Personal Factors and Comorbidities Comorbidity 2    Comorbidities Chronic inflammatory demyelinating polyneuropathy, Uncontrolled DM Type 1, ADHD    Examination-Activity Limitations Dressing;Transfers;Stairs;Stand;Locomotion Level;Bend;Lift    Examination-Participation Restrictions Community Activity;Cleaning;Laundry;Meal Prep     Stability/Clinical Decision Making Evolving/Moderate complexity    Rehab Potential Good    PT Frequency 2x / week    PT Duration 6 weeks    PT Treatment/Interventions ADLs/Self Care Home Management;Aquatic Therapy;Cryotherapy;Moist Heat;DME Instruction;Gait training;Stair training;Functional mobility training;Therapeutic activities;Therapeutic exercise;Balance training;Neuromuscular re-education;Patient/family education;Orthotic Fit/Training;Manual techniques;Passive range of motion    PT Next Visit Plan Continue gait without AD. Obstacle Negotiation, SLS activities. Progress HEP for balance, gait with less restrictive device, gait outdoors, balance on compliant surfaces    PT Home Exercise Plan [id number]    Consulted and Agree with Plan of Care Patient             Patient will benefit from skilled therapeutic intervention in order to improve the following deficits and impairments:  Abnormal gait, Decreased balance, Difficulty walking, Impaired sensation, Decreased strength, Decreased activity tolerance, Impaired UE functional use, Decreased endurance, Decreased knowledge of precautions, Decreased knowledge of use of DME, Impaired perceived functional ability  Visit Diagnosis: Muscle weakness (generalized)  Unsteadiness on feet  Other abnormalities of gait and mobility     Problem List Patient Active Problem List  Diagnosis Date Noted   Proteinuria 11/23/2020   CIDP (chronic inflammatory demyelinating polyneuropathy) ([place]) 05/25/2020   Noncompliance with medication treatment due to difficulty with dosing 05/25/2020   Mixed hyperlipidemia 05/25/2020   Depression 05/25/2020   Diabetic polyneuropathy associated with type 1 diabetes mellitus ([place]) 02/26/2019   Postprandial bloating 02/25/2019   Sensory problems with limbs 02/25/2019   Uncontrolled type 1 diabetes mellitus with hyperglycemia ([place]) 05/24/2017   Morbid obesity ([place]) 05/24/2017   Acanthosis 05/24/2017    Jones Bales, PT, DPT 03/16/2021, 11:46 AM  [facility] [address] [place], [place], [address] Phone: [phone]   Fax:  [phone]  Name: Bronsyn Harrower MRN: [id number] Date of Birth: 10-18-2000

## 2021-03-16 NOTE — Patient Instructions (Signed)
Basic Activities:    Use your affected hand to perform the following activities for 20-30 minutes 1-2 times/day.  Stop activity if you experience pain.   - Flip playing cards - Deal top card with thumb - Toss ball - Rotate ball in your hand  - Turn doorknob - Open/close cabinet door with handle - Pick up coins and place in a container - Stack coins (stacks of 5) and manipulate one at a time to fingertips to place in bank/container - Fold towels - Stack blocks - Pick up 1-inch blocks - Put empty clothes hangers on a rack, remove, and repeat    

## 2021-03-16 NOTE — Therapy (Addendum)
[facility] [address] [place], [place], [address] Phone: [phone]   Fax:  [phone]  Occupational Therapy Treatment  Patient Details  Name: Lucas Cunningham MRN: [id number] Date of Birth: Jun 08, 2000 Referring Provider (OT): Delfin Edis   Encounter Date: 03/16/2021   OT End of Session - 03/16/21 1020     Visit Number 5    Number of Visits 17    Date for OT Re-Evaluation 04/10/21    Authorization Type [employer] (Under 21)    Authorization Time Period Medicaid - 6 visits approved 03-02-21 - 05/13/21    Authorization - Visit Number 5    Authorization - Number of Visits 6    Progress Note Due on Visit 10    OT Start Time 1018    OT Stop Time 1100    OT Time Calculation (min) 42 min    Activity Tolerance Patient tolerated treatment well    Behavior During Therapy WFL for tasks assessed/performed             Past Medical History:  Diagnosis Date   CIDP (chronic inflammatory demyelinating polyneuropathy) ([place])    Diabetes mellitus without complication ([place])    type 1    Past Surgical History:  Procedure Laterality Date   NO PAST SURGERIES      There were no vitals filed for this visit.   Subjective Assessment - 03/16/21 1020     Subjective  "I almost fell asleep - I am tired"    Currently in Pain? No/denies                          OT Treatments/Exercises (OP) - 03/16/21 0001       Fine Motor Coordination (Hand/Wrist)   Fine Motor Coordination Flipping cards;Manipulation of small objects    Manipulation of small objects stacking 1 inch blocks iwth LUE with emphasis on maintaining wrist extension with placing blocks and with control. Pt stacked 6 blocks x 7 towers.    Flipping cards Coordination HEP                    OT Education - 03/16/21 1025     Education Details Coordination HEP    Person(s) Educated Patient    Methods  Explanation;Demonstration;Handout    Comprehension Verbalized understanding;Returned demonstration              OT Short Term Goals - 03/09/21 0953       OT SHORT TERM GOAL #1   Title Patient will demonstrate ability to put on and take off his socks with mod assist    Baseline Dependent    Time 4    Period Weeks    Status On-going   03/09/21 reported donning socks independently   Target Date 04/10/21      OT SHORT TERM GOAL #2   Title Patient will demonstrate ability to pull up zipper once zipper closed, or Pull down zipper on jacket with min assist    Baseline Dependent    Time 4    Period Weeks    Status On-going      OT SHORT TERM GOAL #3   Title Patient will demonstrate ability to legibly print a sentence with dominant right hand and built up grip    Baseline Able to print his name 75% legibility    Time 4    Period Weeks    Status On-going  OT SHORT TERM GOAL #4   Title Patient will demonstrate ability to type on computer, or navigate controls for gaming with set up and increased time    Baseline Unable to type, use computer consistently    Time 4    Period Weeks    Status New      OT SHORT TERM GOAL #5   Title xxx    Baseline xxx               OT Long Term Goals - 02/24/21 1332       OT LONG TERM GOAL #1   Title Patient will don/doff socks with modified independence    Baseline dependent    Time 8    Period Weeks    Status New    Target Date 05/10/21      OT LONG TERM GOAL #2   Title .Patient will perform simple cooking on stovetop with supervision and intermittent assistance    Baseline Is not completing  - dependent on grandmother    Time 8    Period Weeks    Status New      OT LONG TERM GOAL #3   Title Patient will improve grip strength to allow him to open a chip bag with his hands    Baseline Uses his teeth to tear open packages    Time 8    Period Weeks      OT LONG TERM GOAL #4   Title Patient will demonstrate improved fine  motor control such that he can complete the 9 hole peg test in less than 2 min with RUE    Baseline RUE - Just under 1 min for 3 pegs    Time 8    Period Weeks    Status New      OT LONG TERM GOAL #5   Title Patient will demonstrate 75% active wrist extension against gravity in LUE to help with grip strength    Baseline Cannot extend wrist against gravity    Time 8    Period Weeks    Status New      OT LONG TERM GOAL #6   Title Patient will demobnstrate a 5 lb increase in grip strength in BUE    Baseline R 26, L 4    Time 8    Period Weeks    Status New      OT LONG TERM GOAL #7   Title Pt will be compliant and verbalize understanding of wear and care instructions for any splints and/or orthoses PRN    Baseline Patient's custom splint was damaged and is no longer useable    Time 8    Period Weeks    Status New                   Plan - 03/16/21 1147     Clinical Impression Statement Pt continues to demonstrates signficant improvement with RUE coordination and LUE wrist extension for maintaining control. Pt continues to benefit from OT to continue increased skills and independence with ADLs.    OT Occupational Profile and History Detailed Assessment- Review of Records and additional review of physical, cognitive, psychosocial history related to current functional performance    Occupational performance deficits (Please refer to evaluation for details): ADL's;IADL's    Body Structure / Function / Physical Skills ADL;Decreased knowledge of use of DME;Strength;Balance;Dexterity;GMC;Tone;UE functional use;Body mechanics;Endurance;IADL;ROM;Sensation;Mobility;Flexibility;Coordination;Decreased knowledge of precautions;FMC    Rehab Potential Good    Clinical Decision Making  Several treatment options, min-mod task modification necessary    Comorbidities Affecting Occupational Performance: May have comorbidities impacting occupational performance    Modification or Assistance to  Complete Evaluation  No modification of tasks or assist necessary to complete eval    OT Frequency 2x / week    OT Duration 8 weeks    OT Treatment/Interventions Self-care/ADL training;Aquatic Therapy;DME and/or AE instruction;Splinting;Balance training;Therapeutic activities;Therapeutic exercise;Passive range of motion;Functional Mobility Training;Neuromuscular education;Electrical Stimulation;Manual Therapy;Patient/family education    Plan functional tasks for BUE, coordination, strengthening, complete recertification next visit and submit for more medicaid visits.    Consulted and Agree with Plan of Care Patient             Patient will benefit from skilled therapeutic intervention in order to improve the following deficits and impairments:   Body Structure / Function / Physical Skills: ADL, Decreased knowledge of use of DME, Strength, Balance, Dexterity, GMC, Tone, UE functional use, Body mechanics, Endurance, IADL, ROM, Sensation, Mobility, Flexibility, Coordination, Decreased knowledge of precautions, [facility]       Visit Diagnosis: Muscle weakness (generalized)  Unsteadiness on feet  Other lack of coordination  Pain in right wrist  Other abnormalities of gait and mobility  Other symptoms and signs involving the nervous system  Other disturbances of skin sensation    Problem List Patient Active Problem List   Diagnosis Date Noted   Proteinuria 11/23/2020   CIDP (chronic inflammatory demyelinating polyneuropathy) ([place]) 05/25/2020   Noncompliance with medication treatment due to difficulty with dosing 05/25/2020   Mixed hyperlipidemia 05/25/2020   Depression 05/25/2020   Diabetic polyneuropathy associated with type 1 diabetes mellitus ([place]) 02/26/2019   Postprandial bloating 02/25/2019   Sensory problems with limbs 02/25/2019   Uncontrolled type 1 diabetes mellitus with hyperglycemia ([place]) 05/24/2017   Morbid obesity ([place]) 05/24/2017   Acanthosis 05/24/2017     Zachery Conch, OT 03/16/2021, 2:54 PM  [facility] [address] [place], [place], [address] Phone: [phone]   Fax:  [phone]  Name: Macoy Sixtos MRN: [id number] Date of Birth: 05-11-2000

## 2021-03-17 ENCOUNTER — Encounter: Payer: Medicaid Other | Admitting: Student

## 2021-03-18 ENCOUNTER — Other Ambulatory Visit: Payer: Self-pay | Admitting: *Deleted

## 2021-03-18 ENCOUNTER — Other Ambulatory Visit: Payer: Self-pay

## 2021-03-18 DIAGNOSIS — Z5982 Transportation insecurity: Secondary | ICD-10-CM

## 2021-03-18 NOTE — Patient Outreach (Addendum)
Medicaid Managed Care   Nurse Care Manager Note  03/18/2021 Name:  Lucas Cunningham MRN:  [id number] DOB:  2000-04-11  Lucas Cunningham is an 21 y.o. year old male who is a primary patient of Cunningham, Lucas March, MD.  The Medicaid Managed Care Coordination team was consulted for assistance with:    DMII  Mr. Heldreth was given information about Medicaid Managed Care Coordination team services today. Lucas Cunningham Patient agreed to services and verbal consent obtained.  Engaged with patient by telephone for follow up visit in response to provider referral for case management and/or care coordination services.   Assessments/Interventions:  Review of past medical history, allergies, medications, health status, including review of consultants reports, laboratory and other test data, was performed as part of comprehensive evaluation and provision of chronic care management services.  SDOH (Social Determinants of Health) assessments and interventions performed: SDOH Interventions    Flowsheet Row Most Recent Value  SDOH Interventions   Food Insecurity Interventions Other (Comment)  [Care Guide referral for transportation to food pantries or other nonmedical locations]  Physical Activity Interventions Intervention Not Indicated       Care Plan  No Known Allergies  Medications Reviewed Today     Reviewed by Melissa Montane, RN (Registered Nurse) on 03/18/21 at 1443  Med List Status: <None>   Medication Order Taking? Sig Documenting Provider Last Dose Status Informant  atorvastatin (LIPITOR) 10 MG tablet [id number] Yes TAKE ONE TABLET BY MOUTH ONCE DAILY Lacinda Axon, MD Taking Active   Continuous Blood Gluc Receiver (DEXCOM G6 RECEIVER) DEVI [id number] Yes 1 Device by Does not apply route as directed. Sherrlyn Hock, MD Taking Active   Continuous Blood Gluc Sensor (DEXCOM G6 SENSOR) MISC [id number] Yes Inject 1 applicator into the skin as directed. (change sensor every 10 days) Al Corpus, MD Taking Active   Continuous Blood Gluc Transmit (DEXCOM G6 TRANSMITTER) MISC [id number] Yes Inject 1 Device into the skin as directed. (re-use up to 8x with each new sensor) Al Corpus, MD Taking Active   Dulaglutide [facility]) [id number] Yes Inject into the skin once a week. [provider] Taking Active   GAMUNEX-C 20 GM/200ML [place] [id number] Yes  [provider] Taking Active   GAMUNEX-C 5 GM/50ML SOLN [id number] Yes  [provider] Taking Active   insulin degludec (TRESIBA FLEXTOUCH) 100 UNIT/ML FlexTouch Pen [id number] Yes Inject into the skin daily. [provider] Taking Active            Med Note (Cliffton Spradley A   Mon Dec 20, 2020  2:23 PM) Taking until it runs out  insulin lispro (HUMALOG KWIKPEN) 200 UNIT/ML KwikPen [id number] Yes Inject up to 100 units daily per provider instructions Al Corpus, MD Taking Active   Insulin Pen Needle (BD PEN NEEDLE NANO U/F) 32G X 4 MM MISC [id number] Yes Inject up to 8 times per day. Marianna Payment, MD Taking Active   PRESCRIPTION MEDICATION [id number] Yes Inject into the vein. IVIG every 4 weeks. Order for new start sent to [facility] on 04/08/2020. Melvenia Beam, MD Taking Active             Patient Active Problem List   Diagnosis Date Noted   Proteinuria 11/23/2020   CIDP (chronic inflammatory demyelinating polyneuropathy) ([place]) 05/25/2020   Noncompliance with medication treatment due to difficulty with dosing 05/25/2020   Mixed hyperlipidemia 05/25/2020   Depression 05/25/2020   Diabetic polyneuropathy associated with type 1 diabetes mellitus ([place])  02/26/2019   Postprandial bloating 02/25/2019   Sensory problems with limbs 02/25/2019   Uncontrolled type 1 diabetes mellitus with hyperglycemia ([facility]) 05/24/2017   Morbid obesity ([facility]) 05/24/2017   Acanthosis 05/24/2017    Conditions to be addressed/monitored per PCP order:  DMII  Care Plan : RN Care Manager Plan of Care   Updates made by Heidi Dach, RN since 03/18/2021 12:00 AM     Problem: Knowledge Deficits and Care Coordination related to managing DM   Priority: High     Long-Range Goal: Development of plan of care to address needs and knowledge deficits related to [name]   Start Date: 01/03/2021  Expected End Date: 04/13/2021  Priority: High  Note:   Current Barriers:  Chronic Disease Management support and education needs related to [name]  Lucas Cunningham reports wearing CGM, blood sugars often around 300. He is attending PT and OT and feels improvement with his strength and mobility. Patient needs to reschedule missed Endocrinology and Eye Exam. Patient needs transportation to food pantries. [employer] is now with [employer], no longer [employer].  RNCM Clinical Goal(s):  Patient will verbalize understanding of plan for management of [name] as evidenced by patient's verbalization of and self management activities take all medications exactly as prescribed and will call provider for medication related questions as evidenced by documentation in EMR and improving lab values    attend all scheduled medical appointments: PCP on 03/21/21 and future PT/OT appointments  as evidenced by physician documentation in EMR        demonstrate improved adherence to prescribed treatment plan for [name] as evidenced by taking all medications as directed, eating a diabetic diet and attending appointments with PCP and specialist  through collaboration with RN Care manager, provider, and care team.   Interventions: Inter-disciplinary care team collaboration (see longitudinal plan of care) Evaluation of current treatment plan related to  self management and patient's adherence to plan as established by provider Discussed the importance of [place] notifying transportation of recent insurance plan change Explained to Lucas Cunningham to utilize medical transportation provided by [employer] [phone] and to cancel the transportation previously  arranged by [facility] Encouraged patient to contact [employer] [phone] for enhanced benefits Care Guide referral for transportation to food pantries   Diabetes:  (Status: Goal on Track (progressing): YES.) Long Term Goal   Lab Results  Component Value Date   HGBA1C 13.3 (A) 09/27/2020  Assessed patient's understanding of A1c goal: <7% Reviewed medications with patient and discussed importance of medication adherence;        Reviewed prescribed diet with patient encouraged to increase lean protein, fruits and vegetables; Discussed plans with patient for ongoing care management follow up and provided patient with direct contact information for care management team;      Provided patient with written educational materials related to hypo and hyperglycemia and importance of correct treatment;       Reviewed scheduled/upcoming provider appointments including:   03/21/21 with PCP, PT/OT and rescheduling Endocrinology, and Eye Exam;         call provider for findings outside established parameters;       Review of patient status, including review of consultants reports, relevant laboratory and other test results, and medications completed;       Provided information to [facility], [address] Johny Shears [place], [place] [address], [phone] Encouraged patient to work on increasing his active time, try to walk around the house for 10 minutes after each meal  Patient Goals/Self-Care Activities: Take medications as prescribed   Attend all scheduled provider appointments Call pharmacy for medication refills 3-7 days in advance of running out of medications Call provider office for new concerns or questions  schedule appointment with eye doctor take the blood sugar meter to all doctor visits drink 6 to 8 glasses of water each day fill half of plate with vegetables keep a food diary manage portion size       Follow Up:  Patient agrees to Care Plan and Follow-up.  Plan:  The Managed Medicaid care management team will reach out to the patient again over the next 30 days.  Date/time of next scheduled RN care management/care coordination outreach:  04/13/21 @ 2:30pm  Lurena Joiner RN, BSN [place] RN Care Coordinator

## 2021-03-18 NOTE — Patient Instructions (Signed)
Visit Information  Mr. Whitter was given information about Medicaid Managed Care team care coordination services as a part of their Healthy [facility] Medicaid benefit. Mansoor Hillyard verbally consented to engagement with the [facility] Managed Care team.   If you are experiencing a medical emergency, please call 911 or report to your local emergency department or urgent care.   If you have a non-emergency medical problem during routine business hours, please contact your provider's office and ask to speak with a nurse.   For questions related to your Healthy [facility] health plan, please call: [phone] or visit the homepage here: [email or web address]  If you would like to schedule transportation through your Healthy [facility] plan, please call the following number at least 2 days in advance of your appointment: [phone]  Call the [facility] Crisis Line at [phone], at any time, 24 hours a day, 7 days a week. If you are in danger or need immediate medical attention call 911.  If you would like help to quit smoking, call 1-800-QUIT-NOW ((629) 050-6679) OR Espaol: 1-855-Djelo-Ya (8-676-195-0932) o para ms informacin haga clic aqu or Text READY to 671-245 to register via text  Mr. Hashem,   Please see education materials related to diabetes/hyperglycemia provided as print materials.   The patient verbalized understanding of instructions provided today and agreed to receive a mailed copy of patient instruction and/or educational materials.  Telephone follow up appointment with Managed Medicaid care management team member scheduled for:04/13/21 @ 2:30pm  [name] RN, BSN [place]   [facility] RN Care Coordinator   Following is a copy of your plan of care:  Care Plan : RN Care Manager Plan of Care  Updates made by Heidi Dach, RN since 03/18/2021 12:00 AM     Problem: Knowledge Deficits and Care Coordination  related to managing DM   Priority: High     Long-Range Goal: Development of plan of care to address needs and knowledge deficits related to [name]   Start Date: 01/03/2021  Expected End Date: 04/13/2021  Priority: High  Note:   Current Barriers:  Chronic Disease Management support and education needs related to [name]  Diamond reports wearing CGM, blood sugars often around 300. He is attending PT and OT and feels improvement with his strength and mobility. Patient needs to reschedule missed Endocrinology and Eye Exam. Patient needs transportation to food pantries. [employer] is now with [employer], no longer [employer].  RNCM Clinical Goal(s):  Patient will verbalize understanding of plan for management of [name] as evidenced by patient's verbalization of and self management activities take all medications exactly as prescribed and will call provider for medication related questions as evidenced by documentation in EMR and improving lab values    attend all scheduled medical appointments: PCP on 03/21/21 and future PT/OT appointments  as evidenced by physician documentation in EMR        demonstrate improved adherence to prescribed treatment plan for [name] as evidenced by taking all medications as directed, eating a diabetic diet and attending appointments with PCP and specialist  through collaboration with RN Care manager, provider, and care team.   Interventions: Inter-disciplinary care team collaboration (see longitudinal plan of care) Evaluation of current treatment plan related to  self management and patient's adherence to plan as established by provider Discussed the importance of [place] notifying transportation of recent insurance plan change Explained to [place] to utilize medical transportation provided by [facility] [phone] and to cancel the transportation previously arranged by  [employer] Encouraged patient to contact [employer] [phone] for enhanced benefits Care Guide  referral for transportation to food pantries   Diabetes:  (Status: Goal on Track (progressing): YES.) Long Term Goal   Lab Results  Component Value Date   HGBA1C 13.3 (A) 09/27/2020  Assessed patient's understanding of A1c goal: <7% Reviewed medications with patient and discussed importance of medication adherence;        Reviewed prescribed diet with patient encouraged to increase lean protein, fruits and vegetables; Discussed plans with patient for ongoing care management follow up and provided patient with direct contact information for care management team;      Provided patient with written educational materials related to hypo and hyperglycemia and importance of correct treatment;       Reviewed scheduled/upcoming provider appointments including:   03/21/21 with PCP, PT/OT and rescheduling Endocrinology, and Eye Exam;         call provider for findings outside established parameters;       Review of patient status, including review of consultants reports, relevant laboratory and other test results, and medications completed;       Provided information to [facility], [address] Johny Shears [place], [place] [address], [phone] Encouraged patient to work on increasing his active time, try to walk around the house for 10 minutes after each meal   Patient Goals/Self-Care Activities: Take medications as prescribed   Attend all scheduled provider appointments Call pharmacy for medication refills 3-7 days in advance of running out of medications Call provider office for new concerns or questions  schedule appointment with eye doctor take the blood sugar meter to all doctor visits drink 6 to 8 glasses of water each day fill half of plate with vegetables keep a food diary manage portion size

## 2021-03-21 ENCOUNTER — Encounter: Payer: Self-pay | Admitting: Student

## 2021-03-21 ENCOUNTER — Ambulatory Visit: Payer: Medicaid Other | Admitting: Occupational Therapy

## 2021-03-21 ENCOUNTER — Other Ambulatory Visit: Payer: Self-pay

## 2021-03-21 ENCOUNTER — Encounter: Payer: Self-pay | Admitting: Occupational Therapy

## 2021-03-21 ENCOUNTER — Ambulatory Visit: Payer: Medicaid Other

## 2021-03-21 DIAGNOSIS — M25531 Pain in right wrist: Secondary | ICD-10-CM

## 2021-03-21 DIAGNOSIS — R208 Other disturbances of skin sensation: Secondary | ICD-10-CM

## 2021-03-21 DIAGNOSIS — M6281 Muscle weakness (generalized): Secondary | ICD-10-CM | POA: Diagnosis not present

## 2021-03-21 DIAGNOSIS — R278 Other lack of coordination: Secondary | ICD-10-CM

## 2021-03-21 DIAGNOSIS — R2689 Other abnormalities of gait and mobility: Secondary | ICD-10-CM

## 2021-03-21 DIAGNOSIS — R2681 Unsteadiness on feet: Secondary | ICD-10-CM

## 2021-03-21 DIAGNOSIS — R29818 Other symptoms and signs involving the nervous system: Secondary | ICD-10-CM

## 2021-03-21 NOTE — Therapy (Addendum)
[facility] [address] [place], [place], [address] Phone: [phone]   Fax:  [phone]  Physical Therapy Treatment  Patient Details  Name: Lucas Cunningham MRN: [id number] Date of Birth: [birth year] Referring Provider (PT): Sarina Ill, MD   Encounter Date: 03/21/2021   PT End of Session - 03/21/21 1017     Visit Number 6    Number of Visits 13    Date for PT Re-Evaluation 04/08/21    Authorization Type [employer]    Authorization Time Period 12 visits 03/02/21-04/13/21    Authorization - Visit Number 5    Authorization - Number of Visits 12    PT Start Time [id number]    PT Stop Time 1058    PT Time Calculation (min) 41 min    Equipment Utilized During Treatment Gait belt    Activity Tolerance Patient tolerated treatment well;No increased pain    Behavior During Therapy WFL for tasks assessed/performed             Past Medical History:  Diagnosis Date   CIDP (chronic inflammatory demyelinating polyneuropathy) (HCC)    Diabetes mellitus without complication ([place])    type 1    Past Surgical History:  Procedure Laterality Date   NO PAST SURGERIES      There were no vitals filed for this visit.   Subjective Assessment - 03/21/21 1020     Subjective Patient reports he is feeling a little more confident in the walking. No falls. Wlked to store but did not wear the brace. No pain.    Pertinent History Chronic inflammatory demyelinating polyneuropathy, Uncontrolled DM Type 1, ADHD    Limitations Standing;Walking;House hold activities    Diagnostic tests MRI c-spine and L-spine unremarkable    Patient Stated Goals Improve balance with Standing; Get Stronger.    Currently in Pain? No/denies              [facility] Adult PT Treatment/Exercise - 03/21/21 0001       Transfers   Transfers Sit to Stand;Stand to Sit    Sit to Stand 5: Supervision    Stand to Sit 5: Supervision    Number of Reps 10 reps;2 sets     Comments completed 2 x 10 reps of sit <> stand from low mat without UE support, one instance of posterior LOB but able to regain balance without assistance from PT. Fatigue noted at end.      Ambulation/Gait   Ambulation/Gait Yes    Ambulation/Gait Assistance 5: Supervision    Ambulation/Gait Assistance Details use of RW to ambulate into/out of session. Completed gait training x 805 ft without use of AD working on improved endurance/activity tolerance    Ambulation Distance (Feet) 805 Feet    Assistive device None;Rolling walker    Gait Pattern Step-through pattern;Decreased stride length;Decreased dorsiflexion - right;Decreased dorsiflexion - left;Wide base of support    Ambulation Surface Level;Indoor      Exercises   Exercises Knee/Hip      Knee/Hip Exercises: Aerobic   Other Aerobic Scifit with BUE/BLE's level 4.0 x 7 minutes with goal >/= 80 steps per minute for strengthening and activity tolerance.             Balance Exercises - 03/21/21 0001       Balance Exercises: Standing   Standing Eyes Closed Narrow base of support (BOS);Solid surface;2 reps;Time    Standing Eyes Closed Time 15 seconds x 4 reps with intermittent touch A. PT  educating on trialing to regain balance and stand tall with core engagement to promote improved standing time    Stepping Strategy Anterior;Lateral;Foam/compliant surface;Limitations    Stepping Strategy Limitations standing on airex completed anterior steps forward off foam then back onto foam x 10 reps bilat, then completed lateral step off foam x 10 reps bilat. Intermittent UE support required.               PT Short Term Goals - 02/24/21 1059       PT SHORT TERM GOAL #1   Title Pt will be IND with initial HEP to demo improved function and balance (ALL STGs Due: 03/18/21)    Baseline no HEP established    Time 3    Period Weeks    Status New    Target Date 03/18/21      PT SHORT TERM GOAL #2   Title Pt will be able to stand  statically without support at [facility] for >/= 1 min 30 seconds with supervision in order to indicate improved balance and strength.    Baseline 1 min, 12 seconds    Time 3    Period Weeks    Status New      PT SHORT TERM GOAL #3   Title Pt will improve gait speed to >/= 2.5 ft/sec w/ LRAD and B AFO as needed to indicate dec fall risk.    Baseline 2.35 ft/sec w/ B AFOs and RW    Time 3    Period Weeks    Status New      PT SHORT TERM GOAL #4   Title Pt will improve 5TSS to </= 20 secs with BUE support in order to indicate improved strength.    Baseline 22.62 seconds    Time 3    Period Weeks    Status New               PT Long Term Goals - 02/24/21 1101       PT LONG TERM GOAL #1   Title Pt will be IND with final HEP in order to indicate improved functional mobility and dec fall risk.  ALL LTGS DUE 04/08/21    Baseline no HEP established    Time 6    Period Weeks    Status New    Target Date 04/08/21      PT LONG TERM GOAL #2   Title Pt will improve gait speed to >/=2.70 ft/sec with LRAD and B AFOs as needed in order to indicate safe community ambulation.    Baseline 2.35 ft/sec    Time 6    Period Weeks    Status New      PT LONG TERM GOAL #3   Title Pt will improve 5TSS to </=17 secs with single UE support only in order to indicate improved functional strength.    Baseline 22.62 secs    Time 6    Period Weeks    Status New      PT LONG TERM GOAL #4   Title Pt will improve TUG to </= 15 seconds with LRAD to demo improved balance and reduced fall risk    Baseline 18.57 secs    Time 6    Period Weeks    Status New      PT LONG TERM GOAL #5   Title Pt will perform dynamic standing balance without UE support x 2 mins in order to perform ADLs safely at home.    Baseline  stand dynamic without UE support x 1 minute    Time 6    Period Weeks    Status New      Additional Long Term Goals   Additional Long Term Goals Yes      PT LONG TERM GOAL #6   Title Pt will  be able to ambulate x 500 over level and unlevel surfaces w/ LRAD and B AFO's to demo improved safe community mobility    Baseline 100 ft indoors with RW    Time 6    Period Weeks    Status New                   Plan - 03/21/21 1043     Clinical Impression Statement Pt able to ambulate approx 800 ft today without use of AD, mild fatigue noted at end. Continued balance training working on improved stepping strategies and balance on level/unlevel surfaces. Also continued sit <> stands from low surface height with increased fatigue, may benefit from trialing from low surface at next session. Will continue per POC.    Personal Factors and Comorbidities Comorbidity 2    Comorbidities Chronic inflammatory demyelinating polyneuropathy, Uncontrolled DM Type 1, ADHD    Examination-Activity Limitations Dressing;Transfers;Stairs;Stand;Locomotion Level;Bend;Lift    Examination-Participation Restrictions Community Activity;Cleaning;Laundry;Meal Prep    Stability/Clinical Decision Making Evolving/Moderate complexity    Rehab Potential Good    PT Frequency 2x / week    PT Duration 6 weeks    PT Treatment/Interventions ADLs/Self Care Home Management;Aquatic Therapy;Cryotherapy;Moist Heat;DME Instruction;Gait training;Stair training;Functional mobility training;Therapeutic activities;Therapeutic exercise;Balance training;Neuromuscular re-education;Patient/family education;Orthotic Fit/Training;Manual techniques;Passive range of motion    PT Next Visit Plan Check STGS (I forgot to do on Monday). Continue gait without AD. Obstacle Negotiation, SLS activities. Progress HEP for balance, gait with less restrictive device, gait outdoors, balance on compliant surfaces    PT Home Exercise Plan [id number]    Consulted and Agree with Plan of Care Patient             Patient will benefit from skilled therapeutic intervention in order to improve the following deficits and impairments:  Abnormal gait,  Decreased balance, Difficulty walking, Impaired sensation, Decreased strength, Decreased activity tolerance, Impaired UE functional use, Decreased endurance, Decreased knowledge of precautions, Decreased knowledge of use of DME, Impaired perceived functional ability  Visit Diagnosis: Unsteadiness on feet  Muscle weakness (generalized)  Other abnormalities of gait and mobility  Other disturbances of skin sensation     Problem List Patient Active Problem List   Diagnosis Date Noted   Proteinuria 11/23/2020   CIDP (chronic inflammatory demyelinating polyneuropathy) ([place]) 05/25/2020   Noncompliance with medication treatment due to difficulty with dosing 05/25/2020   Mixed hyperlipidemia 05/25/2020   Depression 05/25/2020   Diabetic polyneuropathy associated with type 1 diabetes mellitus ([place]) 02/26/2019   Postprandial bloating 02/25/2019   Sensory problems with limbs 02/25/2019   Uncontrolled type 1 diabetes mellitus with hyperglycemia ([place]) 05/27/17   Morbid obesity ([place]) 05/27/2017   Acanthosis 2017-05-27   Managed medicaid CPT codes: 97110- Therapeutic Exercise, [phone]- Neuro Re-education, [id number] - Gait Training, 97140 - Manual Therapy, [id number] - Therapeutic Activities, [id number] - [place], [place], and [place] therapy   Jones Bales, PT, DPT 03/21/2021, 11:54 AM  [facility] [address] [place], [place], [address] Phone: [phone]   Fax:  [phone]  Name: Lucas Cunningham MRN: [id number] Date of Birth: 19-Feb-2000

## 2021-03-21 NOTE — Therapy (Addendum)
[facility] [address] [place], [place], [address] Phone: [phone]   Fax:  [phone]  Occupational Therapy Treatment & Recertification for Medicaid Visits  Patient Details  Name: Lucas Cunningham MRN: [id number] Date of Birth: 04-04-2000 Referring Provider (OT): Delfin Edis   Encounter Date: 03/21/2021   OT End of Session - 03/21/21 1102     Visit Number 6    Number of Visits 17    Date for OT Re-Evaluation 05/16/21   +8 weeks at recert 07/20/32   Authorization Type [employer] (Under 21)    Authorization Time Period Medicaid - 6 visits approved 03-02-21 - 05/13/21 , submitted for 11 additional visits 03/21/2021    Authorization - Visit Number 6    Authorization - Number of Visits 6    Progress Note Due on Visit 16    OT Start Time 1100    OT Stop Time 1145    OT Time Calculation (min) 45 min    Activity Tolerance Patient tolerated treatment well    Behavior During Therapy WFL for tasks assessed/performed             Past Medical History:  Diagnosis Date   CIDP (chronic inflammatory demyelinating polyneuropathy) ([place])    Diabetes mellitus without complication ([place])    type 1    Past Surgical History:  Procedure Laterality Date   NO PAST SURGERIES      There were no vitals filed for this visit.   Subjective Assessment - 03/21/21 1102     Subjective  "I think i'm getting better at drawing my name"    Currently in Pain? No/denies                [facility] OT Assessment - 03/21/21 0001       Coordination   9 Hole Peg Test Right    Right 9 Hole Peg Test 40m18 seconds      Hand Function   Right Hand Grip (lbs) 31.9    Left Hand Grip (lbs) 9.7                      OT Treatments/Exercises (OP) - 03/21/21 1112       Fine Motor Coordination (Hand/Wrist)   Fine Motor Coordination In hand manipuation training    In Hand Manipulation Training typing test on typing master with  10 wpm and 20% accuracy with 2 wpm net speed. played "bubbles" on typing master with increased accuracy and speed with letters vs words with typing test    Small Pegboard with copying pattern with red/green triangles with RUE with mod difficulty and mod drops. Pt with increased ability to use RUE for in hand manipulation for rotating pegs throughout task. Pt removed with LUE                      OT Short Term Goals - 03/21/21 1103       OT SHORT TERM GOAL #1   Title Patient will demonstrate ability to put on and take off his socks with mod assist    Baseline Dependent    Time 4    Period Weeks    Status Achieved   pt putting on low socks independently and consistently per report 03/18/21   Target Date 04/10/21      OT SHORT TERM GOAL #2   Title Patient will demonstrate ability to pull up zipper once zipper closed, or Pull down  zipper on jacket with min assist    Baseline Dependent    Time 4    Period Weeks    Status On-going   pt reports being able to pull zipper up and down once clasp is managed but has not attempted clasping independently - will attempt. pt not wearing zip up clothing today. 03/18/21     OT SHORT TERM GOAL #3   Title Patient will demonstrate ability to legibly print a sentence with dominant right hand and built up grip    Baseline Able to print his name 75% legibility    Time 4    Period Weeks    Status Achieved      OT SHORT TERM GOAL #4   Title Patient will demonstrate ability to type on computer, or navigate controls for gaming with set up and increased time    Baseline Unable to type, use computer consistently    Time 4    Period Weeks    Status On-going   attempted typing test on typing master - 03/21/21     OT SHORT TERM GOAL #5   Title xxx    Baseline xxx               OT Long Term Goals - 03/21/21 1116       OT LONG TERM GOAL #1   Title Patient will don/doff socks with modified independence    Baseline dependent    Time 8     Period Weeks    Status On-going   completing with mod I with low socks 03/21/21   Target Date 05/10/21      OT LONG TERM GOAL #2   Title .Patient will perform simple cooking on stovetop with supervision and intermittent assistance    Baseline Is not completing  - dependent on grandmother    Time 8    Period Weeks    Status On-going      OT LONG TERM GOAL #3   Title Patient will improve grip strength to allow him to open a chip bag with his hands    Baseline Uses his teeth to tear open packages    Time 8    Period Weeks    Status On-going   reports doing this x 1. Continue to assess for consistency 03/21/21     OT LONG TERM GOAL #4   Title Patient will demonstrate improved fine motor control such that he can complete the 9 hole peg test in less than 2 min with RUE. REVISED: RUE complete in 70 seconds or less.    Baseline RUE - Just under 1 min for 3 pegs    Time 8    Period Weeks    Status Revised   will revise to finish in 70 seconds or less with RUE.     OT LONG TERM GOAL #5   Title Patient will demonstrate 75% active wrist extension against gravity in LUE to help with grip strength    Baseline Cannot extend wrist against gravity    Time 8    Period Weeks    Status On-going   approx 60-70% of active extension -03/21/21     OT LONG TERM GOAL #6   Title Patient will demobnstrate a 5 lb increase in grip strength in BUE. REVISE - 10 lbs total, 5 additional lbs BUE    Baseline R 26, L 4, 03/21/21 R 31.9, L 9.7    Time 8    Period Weeks  Status Revised   MET AND REVISE : increase grip strength by 5 additional lbs in BUE.     OT LONG TERM GOAL #7   Title Pt will be compliant and verbalize understanding of wear and care instructions for any splints and/or orthoses PRN    Baseline Patient's custom splint was damaged and is no longer useable    Time 8    Period Weeks    Status On-going   have not seen need - will continue to address 03/21/21                  Plan - 03/21/21  1132     Clinical Impression Statement This note is to recertifiy for plan of care for submitting for Medicaid visits for patient to continue the plan of care and progress towards remaining goals. Pt has demonstrated much improvement from previous time here in this clinic and even during this current episode. Pt would benefit from continued skilled occupational therapy to target BUE functional use, grip strength, coordination and functioanl mobility and balance for increasing independence with ADLs and IADLs.    OT Occupational Profile and History Detailed Assessment- Review of Records and additional review of physical, cognitive, psychosocial history related to current functional performance    Occupational performance deficits (Please refer to evaluation for details): ADL's;IADL's    Body Structure / Function / Physical Skills ADL;Decreased knowledge of use of DME;Strength;Balance;Dexterity;GMC;Tone;UE functional use;Body mechanics;Endurance;IADL;ROM;Sensation;Mobility;Flexibility;Coordination;Decreased knowledge of precautions;FMC    Rehab Potential Good    Clinical Decision Making Several treatment options, min-mod task modification necessary    Comorbidities Affecting Occupational Performance: May have comorbidities impacting occupational performance    Modification or Assistance to Complete Evaluation  No modification of tasks or assist necessary to complete eval    OT Frequency 2x / week    OT Duration 8 weeks    OT Treatment/Interventions Self-care/ADL training;Aquatic Therapy;DME and/or AE instruction;Splinting;Balance training;Therapeutic activities;Therapeutic exercise;Passive range of motion;Functional Mobility Training;Neuromuscular education;Electrical Stimulation;Manual Therapy;Patient/family education    Plan functional tasks for BUE, coordination, strengthening    Consulted and Agree with Plan of Care Patient             Patient will benefit from skilled therapeutic  intervention in order to improve the following deficits and impairments:   Body Structure / Function / Physical Skills: ADL, Decreased knowledge of use of DME, Strength, Balance, Dexterity, GMC, Tone, UE functional use, Body mechanics, Endurance, IADL, ROM, Sensation, Mobility, Flexibility, Coordination, Decreased knowledge of precautions, [facility]    Managed medicaid CPT codes: 09323- Therapeutic Exercise, [phone]- Neuro Re-education, 97140 - Manual Therapy, 97530 - Therapeutic Activities, 97535 - Self Care, 97014 - Electrical stimulation (unattended), [phone] - Electrical stimulation (Manual), [id number] - Orthotic Fit, and [id number] - Aquatic therapy    Visit Diagnosis: Unsteadiness on feet  Muscle weakness (generalized)  Other lack of coordination  Other abnormalities of gait and mobility  Pain in right wrist  Other symptoms and signs involving the nervous system    Problem List Patient Active Problem List   Diagnosis Date Noted   Proteinuria 11/23/2020   CIDP (chronic inflammatory demyelinating polyneuropathy) ([place]) 05/25/2020   Noncompliance with medication treatment due to difficulty with dosing 05/25/2020   Mixed hyperlipidemia 05/25/2020   Depression 05/25/2020   Diabetic polyneuropathy associated with type 1 diabetes mellitus ([place]) 02/26/2019   Postprandial bloating 02/25/2019   Sensory problems with limbs 02/25/2019   Uncontrolled type 1 diabetes mellitus with hyperglycemia ([place]) 05/24/2017   Morbid obesity ([place]) 05/24/2017  Acanthosis 05/24/2017    Zachery Conch, OT 03/21/2021, 12:03 PM  [facility] [address] [place], [place], [address] Phone: [phone]   Fax:  [phone]  Name: Gunnard Dorrance MRN: [id number] Date of Birth: [birth year]

## 2021-03-25 ENCOUNTER — Encounter: Payer: Self-pay | Admitting: Physical Therapy

## 2021-03-25 ENCOUNTER — Ambulatory Visit: Payer: Medicaid Other | Admitting: Occupational Therapy

## 2021-03-25 ENCOUNTER — Ambulatory Visit: Payer: Medicaid Other | Admitting: Physical Therapy

## 2021-03-25 ENCOUNTER — Encounter: Payer: Self-pay | Admitting: Occupational Therapy

## 2021-03-25 ENCOUNTER — Other Ambulatory Visit: Payer: Self-pay

## 2021-03-25 DIAGNOSIS — R278 Other lack of coordination: Secondary | ICD-10-CM

## 2021-03-25 DIAGNOSIS — R2689 Other abnormalities of gait and mobility: Secondary | ICD-10-CM

## 2021-03-25 DIAGNOSIS — R2681 Unsteadiness on feet: Secondary | ICD-10-CM

## 2021-03-25 DIAGNOSIS — M6281 Muscle weakness (generalized): Secondary | ICD-10-CM

## 2021-03-25 DIAGNOSIS — R29818 Other symptoms and signs involving the nervous system: Secondary | ICD-10-CM

## 2021-03-25 DIAGNOSIS — M25531 Pain in right wrist: Secondary | ICD-10-CM

## 2021-03-25 DIAGNOSIS — R208 Other disturbances of skin sensation: Secondary | ICD-10-CM

## 2021-03-25 NOTE — Therapy (Signed)
[facility] [address] [place], [place], [address] Phone: [phone]   Fax:  [phone]  Occupational Therapy Treatment  Patient Details  Name: Lucas Cunningham MRN: [id number] Date of Birth: 2000/07/13 Referring Provider (OT): Delfin Edis   Encounter Date: 03/25/2021   OT End of Session - 03/25/21 1418     Visit Number 7    Number of Visits 17    Date for OT Re-Evaluation 05/16/21   +8 weeks at recert 02/13/53   Authorization Type Per front desk, pt is now [employer]. 03/21/21    Authorization Time Period --    Authorization - Visit Number 7    Progress Note Due on Visit 57    OT Start Time 1415   arrival time   OT Stop Time 1445    OT Time Calculation (min) 30 min    Activity Tolerance Patient tolerated treatment well    Behavior During Therapy WFL for tasks assessed/performed             Past Medical History:  Diagnosis Date   CIDP (chronic inflammatory demyelinating polyneuropathy) (HCC)    Diabetes mellitus without complication ([place])    type 1    Past Surgical History:  Procedure Laterality Date   NO PAST SURGERIES      There were no vitals filed for this visit.   Subjective Assessment - 03/25/21 1416     Subjective  "my nail just broke" "i'm still working on the drawing"    Currently in Pain? No/denies             Grooming with cutting nails - with adapted nail clippers however think patient would be capable of using regular nail clippers but unable to find in clinic  Resistance Clothespins 1-8#. Pt completed yellow and 50% red with LUE. Moved to using RUE for the remainder of clothespins d/t increased strength in RUE.   Grooved Pegs with RUE with mod difficulty and mod drops, increased time.                    OT Short Term Goals - 03/21/21 1103       OT SHORT TERM GOAL #1   Title Patient will demonstrate ability to put on and take off his socks with  mod assist    Baseline Dependent    Time 4    Period Weeks    Status Achieved   pt putting on low socks independently and consistently per report 03/18/21   Target Date 04/10/21      OT SHORT TERM GOAL #2   Title Patient will demonstrate ability to pull up zipper once zipper closed, or Pull down zipper on jacket with min assist    Baseline Dependent    Time 4    Period Weeks    Status On-going   pt reports being able to pull zipper up and down once clasp is managed but has not attempted clasping independently - will attempt. pt not wearing zip up clothing today. 03/18/21     OT SHORT TERM GOAL #3   Title Patient will demonstrate ability to legibly print a sentence with dominant right hand and built up grip    Baseline Able to print his name 75% legibility    Time 4    Period Weeks    Status Achieved      OT SHORT TERM GOAL #4   Title Patient will demonstrate ability to type on computer,  or navigate controls for gaming with set up and increased time    Baseline Unable to type, use computer consistently    Time 4    Period Weeks    Status On-going   attempted typing test on typing master - 03/21/21     OT SHORT TERM GOAL #5   Title xxx    Baseline xxx               OT Long Term Goals - 03/21/21 1116       OT LONG TERM GOAL #1   Title Patient will don/doff socks with modified independence    Baseline dependent    Time 8    Period Weeks    Status On-going   completing with mod I with low socks 03/21/21   Target Date 05/10/21      OT LONG TERM GOAL #2   Title .Patient will perform simple cooking on stovetop with supervision and intermittent assistance    Baseline Is not completing  - dependent on grandmother    Time 8    Period Weeks    Status On-going      OT LONG TERM GOAL #3   Title Patient will improve grip strength to allow him to open a chip bag with his hands    Baseline Uses his teeth to tear open packages    Time 8    Period Weeks    Status On-going   reports  doing this x 1. Continue to assess for consistency 03/21/21     OT LONG TERM GOAL #4   Title Patient will demonstrate improved fine motor control such that he can complete the 9 hole peg test in less than 2 min with RUE. REVISED: RUE complete in 70 seconds or less.    Baseline RUE - Just under 1 min for 3 pegs    Time 8    Period Weeks    Status Revised   will revise to finish in 70 seconds or less with RUE.     OT LONG TERM GOAL #5   Title Patient will demonstrate 75% active wrist extension against gravity in LUE to help with grip strength    Baseline Cannot extend wrist against gravity    Time 8    Period Weeks    Status On-going   approx 60-70% of active extension -03/21/21     OT LONG TERM GOAL #6   Title Patient will demobnstrate a 5 lb increase in grip strength in BUE. REVISE - 10 lbs total, 5 additional lbs BUE    Baseline R 26, L 4, 03/21/21 R 31.9, L 9.7    Time 8    Period Weeks    Status Revised   MET AND REVISE : increase grip strength by 5 additional lbs in BUE.     OT LONG TERM GOAL #7   Title Pt will be compliant and verbalize understanding of wear and care instructions for any splints and/or orthoses PRN    Baseline Patient's custom splint was damaged and is no longer useable    Time 8    Period Weeks    Status On-going   have not seen need - will continue to address 03/21/21                  Plan - 03/25/21 1449     Clinical Impression Statement Pt continues to demonstrate increased coordination and strength in BUE.    OT Occupational Profile and History  Detailed Assessment- Review of Records and additional review of physical, cognitive, psychosocial history related to current functional performance    Occupational performance deficits (Please refer to evaluation for details): ADL's;IADL's    Body Structure / Function / Physical Skills ADL;Decreased knowledge of use of DME;Strength;Balance;Dexterity;GMC;Tone;UE functional use;Body  mechanics;Endurance;IADL;ROM;Sensation;Mobility;Flexibility;Coordination;Decreased knowledge of precautions;FMC    Rehab Potential Good    Clinical Decision Making Several treatment options, min-mod task modification necessary    Comorbidities Affecting Occupational Performance: May have comorbidities impacting occupational performance    Modification or Assistance to Complete Evaluation  No modification of tasks or assist necessary to complete eval    OT Frequency 2x / week    OT Duration 8 weeks    OT Treatment/Interventions Self-care/ADL training;Aquatic Therapy;DME and/or AE instruction;Splinting;Balance training;Therapeutic activities;Therapeutic exercise;Passive range of motion;Functional Mobility Training;Neuromuscular education;Electrical Stimulation;Manual Therapy;Patient/family education    Plan functional tasks for BUE, coordination, strengthening    Consulted and Agree with Plan of Care Patient             Patient will benefit from skilled therapeutic intervention in order to improve the following deficits and impairments:   Body Structure / Function / Physical Skills: ADL, Decreased knowledge of use of DME, Strength, Balance, Dexterity, GMC, Tone, UE functional use, Body mechanics, Endurance, IADL, ROM, Sensation, Mobility, Flexibility, Coordination, Decreased knowledge of precautions, [place]       Visit Diagnosis: Unsteadiness on feet  Other lack of coordination  Muscle weakness (generalized)  Pain in right wrist  Other disturbances of skin sensation  Other symptoms and signs involving the nervous system    Problem List Patient Active Problem List   Diagnosis Date Noted   Proteinuria 11/23/2020   CIDP (chronic inflammatory demyelinating polyneuropathy) ([facility]) 05/25/2020   Noncompliance with medication treatment due to difficulty with dosing 05/25/2020   Mixed hyperlipidemia 05/25/2020   Depression 05/25/2020   Diabetic polyneuropathy associated with type 1  diabetes mellitus ([place]) 02/26/2019   Postprandial bloating 02/25/2019   Sensory problems with limbs 02/25/2019   Uncontrolled type 1 diabetes mellitus with hyperglycemia ([place]) 05/24/2017   Morbid obesity ([place]) 05/24/2017   Acanthosis 05/24/2017    Zachery Conch, OT 03/25/2021, 2:49 PM  [facility] [address] [place], [place], [address] Phone: [phone]   Fax:  [phone]  Name: Lucas Cunningham MRN: [id number] Date of Birth: Jun 26, 2000

## 2021-03-27 NOTE — Therapy (Signed)
[facility] [address] [place], [place], [address] Phone: [phone]   Fax:  [phone]  Physical Therapy Treatment  Patient Details  Name: Lucas Cunningham MRN: [id number] Date of Birth: [birth year] Referring Provider (PT): Sarina Ill, MD   Encounter Date: 03/25/2021   03/25/21 1452  PT Visits / Re-Eval  Visit Number 7  Number of Visits 13  Date for PT Re-Evaluation 04/08/21  Authorization  Authorization Type [employer]  Authorization Time Period 12 visits 03/02/21-04/13/21  Authorization - Visit Number 6  Authorization - Number of Visits 12  PT Time Calculation  PT Start Time 8453  PT Stop Time 1525  PT Time Calculation (min) 39 min  PT - End of Session  Equipment Utilized During Treatment Gait belt  Activity Tolerance Patient tolerated treatment well;No increased pain  Behavior During Therapy WFL for tasks assessed/performed     Past Medical History:  Diagnosis Date   CIDP (chronic inflammatory demyelinating polyneuropathy) (HCC)    Diabetes mellitus without complication ([place])    type 1    Past Surgical History:  Procedure Laterality Date   NO PAST SURGERIES      There were no vitals filed for this visit.    03/25/21 1644  Symptoms/Limitations  Subjective No new complaints. No pain or falls to report.  Pertinent History Chronic inflammatory demyelinating polyneuropathy, Uncontrolled DM Type 1, ADHD  Limitations Standing;Walking;House hold activities  Patient Stated Goals Improve balance with Standing; Get Stronger.  Pain Assessment  Currently in Pain? No/denies     03/25/21 1452  Transfers  Transfers Sit to Stand;Stand to Sit  Sit to Stand 5: Supervision  Five time sit to stand comments  13.63 sec's  no UE support from standard height surface  Stand to Sit 5: Supervision  Comments pt able to stand >/= 3 minutes with no UE support while working on lateral weight shifitng, alternating  knee bends while standing with no balance loss or issues noted. supervision for safety.  Ambulation/Gait  Ambulation/Gait Yes  Ambulation/Gait Assistance 5: Supervision  Ambulation/Gait Assistance Details use of walker to enter session. no device used in sessino.  Assistive device None;Rolling walker  Gait Pattern Step-through pattern;Decreased stride length;Decreased dorsiflexion - right;Decreased dorsiflexion - left;Wide base of support  Ambulation Surface Level;Indoor  Gait velocity 10.35 sec's= 3.17 ft/sec no device/AFO's only  Exercises  Exercises Other Exercises  Other Exercises  discussed HEP. going well. still challenging.  Knee/Hip Exercises: Aerobic  Other Aerobic Scifit with BUE/BLE's level 5.5 x 8 minutes with goal >/= 80 steps per minute for strengthening and activity tolerance.       03/25/21 0001  Balance Exercises: Standing  Standing Eyes Closed Foam/compliant surface;3 reps;30 secs;Limitations;Wide (BOA)  Standing Eyes Closed Time on airex with no UE support, feet hip width apart for EC 30 sec's x 3 reps, min guard to min assist for balance  Balance Beam standing across blue foam beam with light support on bars: alternating forward stepping to floor back onto beam./ then alternating backward stepping to floor/back onto beam for ~10 reps each . Cues for step length and height to clear beam. Min guard assist for safety.  Tandem Gait Forward;Retro;Upper extremity support;Foam/compliant surface;4 reps;Limitations  Sidestepping Foam/compliant support;Upper extremity support;4 reps;Limitations. On blue foam beam with cues on posture and step placement, min guard assist for safety.   Sidestepping Limitations on blue foam beam with cues on step length and height with light UE support, min guard assist for safety.  PT Short Term Goals - 03/27/21 1648       PT SHORT TERM GOAL #1   Title Pt will be IND with initial HEP to demo improved function and balance (ALL STGs  Due: 03/18/21)    Baseline 03/25/21: met with current HEP    Status Achieved    Target Date 03/18/21      PT SHORT TERM GOAL #2   Title Pt will be able to stand statically without support at [facility] for >/= 1 min 30 seconds with supervision in order to indicate improved balance and strength.    Baseline 03/25/21: met in session today    Status Achieved      PT SHORT TERM GOAL #3   Title Pt will improve gait speed to >/= 2.5 ft/sec w/ LRAD and B AFO as needed to indicate dec fall risk.    Baseline 03/25/21: 3.17 ft/sec no AD, bil braces only    Status Achieved      PT SHORT TERM GOAL #4   Title Pt will improve 5TSS to </= 20 secs with BUE support in order to indicate improved strength.    Baseline 03/25/21: 13.63 sec's no UE support from standard height surface    Status Achieved                 PT Long Term Goals - 02/24/21 1101       PT LONG TERM GOAL #1   Title Pt will be IND with final HEP in order to indicate improved functional mobility and dec fall risk.  ALL LTGS DUE 04/08/21    Baseline no HEP established    Time 6    Period Weeks    Status New    Target Date 04/08/21      PT LONG TERM GOAL #2   Title Pt will improve gait speed to >/=2.70 ft/sec with LRAD and B AFOs as needed in order to indicate safe community ambulation.    Baseline 2.35 ft/sec    Time 6    Period Weeks    Status New      PT LONG TERM GOAL #3   Title Pt will improve 5TSS to </=17 secs with single UE support only in order to indicate improved functional strength.    Baseline 22.62 secs    Time 6    Period Weeks    Status New      PT LONG TERM GOAL #4   Title Pt will improve TUG to </= 15 seconds with LRAD to demo improved balance and reduced fall risk    Baseline 18.57 secs    Time 6    Period Weeks    Status New      PT LONG TERM GOAL #5   Title Pt will perform dynamic standing balance without UE support x 2 mins in order to perform ADLs safely at home.    Baseline stand dynamic without UE  support x 1 minute    Time 6    Period Weeks    Status New      Additional Long Term Goals   Additional Long Term Goals Yes      PT LONG TERM GOAL #6   Title Pt will be able to ambulate x 500 over level and unlevel surfaces w/ LRAD and B AFO's to demo improved safe community mobility    Baseline 100 ft indoors with RW    Time 6    Period Weeks    Status New  03/25/21 1452  Plan  Clinical Impression Statement Today's skilled session initially focused on progress toward STGs with all goals met. Pt has also met LTGs for 10 meter gait speed and 5 time sit to stands, primary PT to upgrade goals. Remainder of session focused on balance training on compliant surfaces with no issues noted or reported in session. The pt is making steady progress and should benefit from continued PT to progress toward unmet goals.  Personal Factors and Comorbidities Comorbidity 2  Comorbidities Chronic inflammatory demyelinating polyneuropathy, Uncontrolled DM Type 1, ADHD  Examination-Activity Limitations Dressing;Transfers;Stairs;Stand;Locomotion Level;Bend;Lift  Examination-Participation Restrictions Community Activity;Cleaning;Laundry;Meal Prep  Pt will benefit from skilled therapeutic intervention in order to improve on the following deficits Abnormal gait;Decreased balance;Difficulty walking;Impaired sensation;Decreased strength;Decreased activity tolerance;Impaired UE functional use;Decreased endurance;Decreased knowledge of precautions;Decreased knowledge of use of DME;Impaired perceived functional ability  Stability/Clinical Decision Making Evolving/Moderate complexity  Rehab Potential Good  PT Frequency 2x / week  PT Duration 6 weeks  PT Treatment/Interventions ADLs/Self Care Home Management;Aquatic Therapy;Cryotherapy;Moist Heat;DME Instruction;Gait training;Stair training;Functional mobility training;Therapeutic activities;Therapeutic exercise;Balance training;Neuromuscular  re-education;Patient/family education;Orthotic Fit/Training;Manual techniques;Passive range of motion  PT Next Visit Plan Continue gait without AD. Obstacle Negotiation, SLS activities. Progress HEP for balance, gait with less restrictive device, gait outdoors, balance on compliant surfaces  PT Home Exercise Plan [email or web address]  Consulted and Agree with Plan of Care Patient          Patient will benefit from skilled therapeutic intervention in order to improve the following deficits and impairments:  Abnormal gait, Decreased balance, Difficulty walking, Impaired sensation, Decreased strength, Decreased activity tolerance, Impaired UE functional use, Decreased endurance, Decreased knowledge of precautions, Decreased knowledge of use of DME, Impaired perceived functional ability  Visit Diagnosis: Unsteadiness on feet  Muscle weakness (generalized)  Other abnormalities of gait and mobility  Other symptoms and signs involving the nervous system     Problem List Patient Active Problem List   Diagnosis Date Noted   Proteinuria 11/23/2020   CIDP (chronic inflammatory demyelinating polyneuropathy) ([place]) 05/25/2020   Noncompliance with medication treatment due to difficulty with dosing 05/25/2020   Mixed hyperlipidemia 05/25/2020   Depression 05/25/2020   Diabetic polyneuropathy associated with type 1 diabetes mellitus ([place]) 02/26/2019   Postprandial bloating 02/25/2019   Sensory problems with limbs 02/25/2019   Uncontrolled type 1 diabetes mellitus with hyperglycemia ([place]) 05/24/2017   Morbid obesity ([place]) 05/24/2017   Acanthosis 05/24/2017    Willow Ora, PTA 03/27/2021, 4:44 PM  [facility] [address] [place], [place], [address] Phone: [phone]   Fax:  [phone]  Name: Lucas Cunningham MRN: [id number] Date of Birth: [birth year]

## 2021-03-28 ENCOUNTER — Other Ambulatory Visit: Payer: Self-pay

## 2021-03-28 ENCOUNTER — Ambulatory Visit: Payer: Medicaid Other | Admitting: Occupational Therapy

## 2021-03-28 ENCOUNTER — Encounter: Payer: Self-pay | Admitting: Occupational Therapy

## 2021-03-28 ENCOUNTER — Ambulatory Visit: Payer: Medicaid Other

## 2021-03-28 DIAGNOSIS — R208 Other disturbances of skin sensation: Secondary | ICD-10-CM

## 2021-03-28 DIAGNOSIS — R2681 Unsteadiness on feet: Secondary | ICD-10-CM

## 2021-03-28 DIAGNOSIS — R2689 Other abnormalities of gait and mobility: Secondary | ICD-10-CM

## 2021-03-28 DIAGNOSIS — M6281 Muscle weakness (generalized): Secondary | ICD-10-CM

## 2021-03-28 DIAGNOSIS — M25531 Pain in right wrist: Secondary | ICD-10-CM

## 2021-03-28 DIAGNOSIS — R29818 Other symptoms and signs involving the nervous system: Secondary | ICD-10-CM

## 2021-03-28 DIAGNOSIS — R278 Other lack of coordination: Secondary | ICD-10-CM

## 2021-03-28 NOTE — Therapy (Signed)
[facility] [address] [place], [place], [address] Phone: [phone]   Fax:  [phone]  Occupational Therapy Treatment  Patient Details  Name: Lucas Cunningham MRN: [id number] Date of Birth: 18-Aug-2000 Referring Provider (OT): Delfin Edis   Encounter Date: 03/28/2021   OT End of Session - 03/28/21 1017     Visit Number 8    Number of Visits 17    Date for OT Re-Evaluation 05/16/21   +8 weeks at recert 04/22/51   Authorization Type Per front desk, pt is now [employer]. 03/21/21    Authorization - Visit Number 8    Progress Note Due on Visit 62    OT Start Time 1015    OT Stop Time 1055    OT Time Calculation (min) 40 min    Activity Tolerance Patient tolerated treatment well    Behavior During Therapy WFL for tasks assessed/performed             Past Medical History:  Diagnosis Date   CIDP (chronic inflammatory demyelinating polyneuropathy) ([place])    Diabetes mellitus without complication ([place])    type 1    Past Surgical History:  Procedure Laterality Date   NO PAST SURGERIES      There were no vitals filed for this visit.   Subjective Assessment - 03/28/21 1016     Subjective  "I need new tennis balls"    Currently in Pain? No/denies                          OT Treatments/Exercises (OP) - 03/28/21 0001       ADLs   Functional Mobility did not use walker in kitchen as patient reports he does not use the walker in the house much. Pt with no LOB and with good safety with no AD during cooking task.    Cooking scrambled eggs with supervision. Pt was able to manipulate everything but req'd some verbal cues for finding items and sequencing. Pt excited to be able to do this and is excited to try and complete at home.      Fine Motor Coordination (Hand/Wrist)   Fine Motor Coordination Small Pegboard    Small Pegboard Medium Pegs - with LUE with mod drops mainly with in hand  mainpulation and rotation and min difficulty - Pt copied peg design with 100% accuracy. Removed with in hand manipulation with RUE with mod difficulty.                      OT Short Term Goals - 03/21/21 1103       OT SHORT TERM GOAL #1   Title Patient will demonstrate ability to put on and take off his socks with mod assist    Baseline Dependent    Time 4    Period Weeks    Status Achieved   pt putting on low socks independently and consistently per report 03/18/21   Target Date 04/10/21      OT SHORT TERM GOAL #2   Title Patient will demonstrate ability to pull up zipper once zipper closed, or Pull down zipper on jacket with min assist    Baseline Dependent    Time 4    Period Weeks    Status On-going   pt reports being able to pull zipper up and down once clasp is managed but has not attempted clasping independently - will attempt. pt not  wearing zip up clothing today. 03/18/21     OT SHORT TERM GOAL #3   Title Patient will demonstrate ability to legibly print a sentence with dominant right hand and built up grip    Baseline Able to print his name 75% legibility    Time 4    Period Weeks    Status Achieved      OT SHORT TERM GOAL #4   Title Patient will demonstrate ability to type on computer, or navigate controls for gaming with set up and increased time    Baseline Unable to type, use computer consistently    Time 4    Period Weeks    Status On-going   attempted typing test on typing master - 03/21/21     OT SHORT TERM GOAL #5   Title xxx    Baseline xxx               OT Long Term Goals - 03/28/21 1042       OT LONG TERM GOAL #1   Title Patient will don/doff socks with modified independence    Baseline dependent    Time 8    Period Weeks    Status On-going   completing with mod I with low socks 03/21/21   Target Date 05/10/21      OT LONG TERM GOAL #2   Title .Patient will perform simple cooking on stovetop with supervision and intermittent  assistance    Baseline Is not completing  - dependent on grandmother    Time 8    Period Weeks    Status Achieved      OT LONG TERM GOAL #3   Title Patient will improve grip strength to allow him to open a chip bag with his hands    Baseline Uses his teeth to tear open packages    Time 8    Period Weeks    Status On-going   reports doing this x 1. Continue to assess for consistency 03/21/21     OT LONG TERM GOAL #4   Title Patient will demonstrate improved fine motor control such that he can complete the 9 hole peg test in less than 2 min with RUE. REVISED: RUE complete in 70 seconds or less.    Baseline RUE - Just under 1 min for 3 pegs    Time 8    Period Weeks    Status Revised   will revise to finish in 70 seconds or less with RUE.     OT LONG TERM GOAL #5   Title Patient will demonstrate 75% active wrist extension against gravity in LUE to help with grip strength    Baseline Cannot extend wrist against gravity    Time 8    Period Weeks    Status On-going   approx 60-70% of active extension -03/21/21     OT LONG TERM GOAL #6   Title Patient will demobnstrate a 5 lb increase in grip strength in BUE. REVISE - 10 lbs total, 5 additional lbs BUE    Baseline R 26, L 4, 03/21/21 R 31.9, L 9.7    Time 8    Period Weeks    Status Revised   MET AND REVISE : increase grip strength by 5 additional lbs in BUE.     OT LONG TERM GOAL #7   Title Pt will be compliant and verbalize understanding of wear and care instructions for any splints and/or orthoses PRN    Baseline Patient's  custom splint was damaged and is no longer useable    Time 8    Period Weeks    Status On-going   have not seen need - will continue to address 03/21/21                  Plan - 03/28/21 1043     Clinical Impression Statement Pt completed simple warm meal prep with distant supervision today. Pt excited about increased independence.    OT Occupational Profile and History Detailed Assessment- Review of  Records and additional review of physical, cognitive, psychosocial history related to current functional performance    Occupational performance deficits (Please refer to evaluation for details): ADL's;IADL's    Body Structure / Function / Physical Skills ADL;Decreased knowledge of use of DME;Strength;Balance;Dexterity;GMC;Tone;UE functional use;Body mechanics;Endurance;IADL;ROM;Sensation;Mobility;Flexibility;Coordination;Decreased knowledge of precautions;FMC    Rehab Potential Good    Clinical Decision Making Several treatment options, min-mod task modification necessary    Comorbidities Affecting Occupational Performance: May have comorbidities impacting occupational performance    Modification or Assistance to Complete Evaluation  No modification of tasks or assist necessary to complete eval    OT Frequency 2x / week    OT Duration 8 weeks    OT Treatment/Interventions Self-care/ADL training;Aquatic Therapy;DME and/or AE instruction;Splinting;Balance training;Therapeutic activities;Therapeutic exercise;Passive range of motion;Functional Mobility Training;Neuromuscular education;Electrical Stimulation;Manual Therapy;Patient/family education    Plan folding laundry, BUE coordination, strengthening, LUE wrist extension    Consulted and Agree with Plan of Care Patient             Patient will benefit from skilled therapeutic intervention in order to improve the following deficits and impairments:   Body Structure / Function / Physical Skills: ADL, Decreased knowledge of use of DME, Strength, Balance, Dexterity, GMC, Tone, UE functional use, Body mechanics, Endurance, IADL, ROM, Sensation, Mobility, Flexibility, Coordination, Decreased knowledge of precautions, [place]       Visit Diagnosis: Unsteadiness on feet  Other lack of coordination  Muscle weakness (generalized)  Pain in right wrist  Other disturbances of skin sensation  Other symptoms and signs involving the nervous  system  Other abnormalities of gait and mobility    Problem List Patient Active Problem List   Diagnosis Date Noted   Proteinuria 11/23/2020   CIDP (chronic inflammatory demyelinating polyneuropathy) ([place]) 05/25/2020   Noncompliance with medication treatment due to difficulty with dosing 05/25/2020   Mixed hyperlipidemia 05/25/2020   Depression 05/25/2020   Diabetic polyneuropathy associated with type 1 diabetes mellitus ([place]) 02/26/2019   Postprandial bloating 02/25/2019   Sensory problems with limbs 02/25/2019   Uncontrolled type 1 diabetes mellitus with hyperglycemia ([place]) 05/24/2017   Morbid obesity ([place]) 05/24/2017   Acanthosis 05/24/2017    Zachery Conch, OT 03/28/2021, 10:56 AM  [facility] [address] [place], [place], [address] Phone: [phone]   Fax:  [phone]  Name: Lucas Cunningham MRN: [id number] Date of Birth: 08/12/2000

## 2021-03-28 NOTE — Therapy (Signed)
[facility] [address] [place], [place], [address] Phone: [phone]   Fax:  [phone]  Physical Therapy Treatment  Patient Details  Name: Lucas Cunningham MRN: [id number] Date of Birth: 04/29/2000 Referring Provider (PT): Sarina Ill, MD   Encounter Date: 03/28/2021   PT End of Session - 03/28/21 1102     Visit Number 8    Number of Visits 13    Date for PT Re-Evaluation 04/08/21    Authorization Type [employer]    Authorization Time Period 12 visits 03/02/21-04/13/21    Authorization - Visit Number 7    Authorization - Number of Visits 12    PT Start Time 7048    PT Stop Time 1142    PT Time Calculation (min) 44 min    Equipment Utilized During Treatment Gait belt    Activity Tolerance Patient tolerated treatment well;No increased pain    Behavior During Therapy WFL for tasks assessed/performed             Past Medical History:  Diagnosis Date   CIDP (chronic inflammatory demyelinating polyneuropathy) ([facility])    Diabetes mellitus without complication ([place])    type 1    Past Surgical History:  Procedure Laterality Date   NO PAST SURGERIES      There were no vitals filed for this visit.   Subjective Assessment - 03/28/21 1101     Subjective No new changes/complaints. No pain. Reports had a good weekend.    Pertinent History Chronic inflammatory demyelinating polyneuropathy, Uncontrolled DM Type 1, ADHD    Limitations Standing;Walking;House hold activities    Patient Stated Goals Improve balance with Standing; Get Stronger.    Currently in Pain? No/denies                [facility] Adult PT Treatment/Exercise - 03/28/21 0001       Transfers   Transfers Sit to Stand;Stand to Sit    Sit to Stand 5: Supervision    Stand to Sit 5: Supervision    Number of Reps 1 set;Other reps (comment)    Comments x 5 reps from 14" box with airex on top, completed x 5 reps. increased challenge from this  surface noted. Single UE support required.      Ambulation/Gait   Ambulation/Gait Yes    Ambulation/Gait Assistance 5: Supervision    Ambulation/Gait Assistance Details Completed ambulation x 1200 ft outdoors on unlevel surfaces including pavement and grass. PT carrying RW in case patient had increased fatigue, but did not require. Mild fatigue noted at end require seated rest break. Supervision. New tennis balls provided for RW for improved propulsion.    Ambulation Distance (Feet) 1200 Feet    Assistive device None;Rolling walker    Gait Pattern Step-through pattern;Decreased stride length;Decreased dorsiflexion - right;Decreased dorsiflexion - left;Wide base of support    Ambulation Surface Level;Indoor;Unlevel;Outdoor;Paved;Grass    Curb 5: Supervision    Curb Details (indicate cue type and reason) outdoors on paved surface completed ascend/descend curb x 4 reps. Initially with RW placed in front, removing as patient demo ability to complete without AD use. Cues to ascend with RLE due to improved stability.      Exercises   Exercises Knee/Hip      Knee/Hip Exercises: Aerobic   Other Aerobic Scifit with BLE's only level 5.5 x 8 minutes with goal >/= 80 steps per minute for strengthening and activity tolerance.  Balance Exercises - 03/28/21 0001       Balance Exercises: Standing   Standing Eyes Opened Wide (BOA);Narrow base of support (BOS);Foam/compliant surface;Limitations    Standing Eyes Opened Limitations completed stand ing bil stance on airex with EO x 30 seconds, then progressing to narrow BOS 3 x 30 seconds. increased postural sway noted, intemrittent touch A to // bars.    SLS with Vectors Solid surface;Intermittent upper extremity assist;Limitations    SLS with Vectors Limitations standing on firm surface completed alternating toe taps to cones x 10 reps bilat without UE support. intermittent touch A to // bars and CGA noted. Increased challenge with SLS  noted on LLE > RLE.    Standing, One Foot on a Step Eyes open;6 inch;3 reps;30 secs;Time;Limitations    Standing, One Foot on a Step Time standing with opposite LE on 6" step holding balance with EO x 30 seconds each, then added in horizontal/vertical head turns 2 x 5 reps each.                  PT Short Term Goals - 03/27/21 1648       PT SHORT TERM GOAL #1   Title Pt will be IND with initial HEP to demo improved function and balance (ALL STGs Due: 03/18/21)    Baseline 03/25/21: met with current HEP    Status Achieved    Target Date 03/18/21      PT SHORT TERM GOAL #2   Title Pt will be able to stand statically without support at [facility] for >/= 1 min 30 seconds with supervision in order to indicate improved balance and strength.    Baseline 03/25/21: met in session today    Status Achieved      PT SHORT TERM GOAL #3   Title Pt will improve gait speed to >/= 2.5 ft/sec w/ LRAD and B AFO as needed to indicate dec fall risk.    Baseline 03/25/21: 3.17 ft/sec no AD, bil braces only    Status Achieved      PT SHORT TERM GOAL #4   Title Pt will improve 5TSS to </= 20 secs with BUE support in order to indicate improved strength.    Baseline 03/25/21: 13.63 sec's no UE support from standard height surface    Status Achieved               PT Long Term Goals - 03/28/21 1206       PT LONG TERM GOAL #1   Title Pt will be IND with final HEP in order to indicate improved functional mobility and dec fall risk.  ALL LTGS DUE 04/08/21    Baseline no HEP established    Time 6    Period Weeks    Status New    Target Date 04/08/21      PT LONG TERM GOAL #2   Title Pt will improve gait speed to >/=3.30 ft/sec with LRAD and B AFOs as needed in order to indicate safe community ambulation.    Baseline 2.35 ft/sec    Time 6    Period Weeks    Status Revised      PT LONG TERM GOAL #3   Title Pt will improve 5TSS to </=12 secs with single UE support only in order to indicate improved  functional strength.    Baseline 22.62 secs    Time 6    Period Weeks    Status Revised      PT LONG  TERM GOAL #4   Title Pt will improve TUG to </= 15 seconds with LRAD to demo improved balance and reduced fall risk    Baseline 18.57 secs    Time 6    Period Weeks    Status New      PT LONG TERM GOAL #5   Title Pt will perform dynamic standing balance without UE support x 4 mins in order to perform ADLs safely at home.    Baseline stand dynamic without UE support x 1 minute    Time 6    Period Weeks    Status Revised      PT LONG TERM GOAL #6   Title Pt will be able to ambulate x 500 over level and unlevel surfaces w/ LRAD and B AFO's to demo improved safe community mobility    Baseline 100 ft indoors with RW    Time 6    Period Weeks    Status New                   Plan - 03/28/21 1137     Clinical Impression Statement Compleetd gait training outdoors without use of AD, as well as curb negotiation. Patient initially hesistant completing curbs without UE support but improvements noted with practice. Will continue to work on activities to improve balance with curb negotiation. Continued BLE and balance activities to patient's tolerance. Will continue per POC.    Personal Factors and Comorbidities Comorbidity 2    Comorbidities Chronic inflammatory demyelinating polyneuropathy, Uncontrolled DM Type 1, ADHD    Examination-Activity Limitations Dressing;Transfers;Stairs;Stand;Locomotion Level;Bend;Lift    Examination-Participation Restrictions Community Activity;Cleaning;Laundry;Meal Prep    Stability/Clinical Decision Making Evolving/Moderate complexity    Rehab Potential Good    PT Frequency 2x / week    PT Duration 6 weeks    PT Treatment/Interventions ADLs/Self Care Home Management;Aquatic Therapy;Cryotherapy;Moist Heat;DME Instruction;Gait training;Stair training;Functional mobility training;Therapeutic activities;Therapeutic exercise;Balance training;Neuromuscular  re-education;Patient/family education;Orthotic Fit/Training;Manual techniques;Passive range of motion    PT Next Visit Plan Continue curb negotiation without UE support. Continue gait without AD. Obstacle Negotiation, SLS activities. Progress HEP for balance, gait with less restrictive device, gait outdoors, balance on compliant surfaces    PT Home Exercise Plan [id number]    Consulted and Agree with Plan of Care Patient             Patient will benefit from skilled therapeutic intervention in order to improve the following deficits and impairments:  Abnormal gait, Decreased balance, Difficulty walking, Impaired sensation, Decreased strength, Decreased activity tolerance, Impaired UE functional use, Decreased endurance, Decreased knowledge of precautions, Decreased knowledge of use of DME, Impaired perceived functional ability  Visit Diagnosis: Unsteadiness on feet  Other abnormalities of gait and mobility  Muscle weakness (generalized)     Problem List Patient Active Problem List   Diagnosis Date Noted   Proteinuria 11/23/2020   CIDP (chronic inflammatory demyelinating polyneuropathy) ([place]) 05/25/2020   Noncompliance with medication treatment due to difficulty with dosing 05/25/2020   Mixed hyperlipidemia 05/25/2020   Depression 05/25/2020   Diabetic polyneuropathy associated with type 1 diabetes mellitus ([place]) 02/26/2019   Postprandial bloating 02/25/2019   Sensory problems with limbs 02/25/2019   Uncontrolled type 1 diabetes mellitus with hyperglycemia ([place]) 05/24/2017   Morbid obesity ([place]) 05/24/2017   Acanthosis 05/24/2017    Jones Bales, PT, DPT 03/28/2021, 12:07 PM  [facility] [address] [place], [place], [address] Phone: [phone]   Fax:  [phone]  Name: Lucas Cunningham MRN: [id number] Date of Birth: 04-08-2000

## 2021-03-28 NOTE — Telephone Encounter (Signed)
Received a fax from Medicaid stating Gamunex-C has been denied. Since this is handled by [facility], I have reached out to [facility] w/ [employer] and faxed the denial letter to her at [phone]. Received a receipt of confirmation. Will update when I hear back.

## 2021-03-30 ENCOUNTER — Ambulatory Visit: Payer: Medicaid Other | Admitting: Rehabilitation

## 2021-03-30 ENCOUNTER — Ambulatory Visit: Payer: Medicaid Other | Admitting: Occupational Therapy

## 2021-03-31 ENCOUNTER — Ambulatory Visit (INDEPENDENT_AMBULATORY_CARE_PROVIDER_SITE_OTHER): Payer: Medicaid Other | Admitting: Student

## 2021-03-31 ENCOUNTER — Encounter: Payer: Self-pay | Admitting: Student

## 2021-03-31 VITALS — BP 119/69 | HR 92 | Temp 99.2°F | Ht 73.0 in | Wt 284.0 lb

## 2021-03-31 DIAGNOSIS — G6181 Chronic inflammatory demyelinating polyneuritis: Secondary | ICD-10-CM

## 2021-03-31 DIAGNOSIS — E1065 Type 1 diabetes mellitus with hyperglycemia: Secondary | ICD-10-CM | POA: Diagnosis not present

## 2021-03-31 DIAGNOSIS — E1042 Type 1 diabetes mellitus with diabetic polyneuropathy: Secondary | ICD-10-CM | POA: Diagnosis not present

## 2021-03-31 DIAGNOSIS — E782 Mixed hyperlipidemia: Secondary | ICD-10-CM | POA: Diagnosis not present

## 2021-03-31 DIAGNOSIS — R809 Proteinuria, unspecified: Secondary | ICD-10-CM | POA: Diagnosis not present

## 2021-03-31 LAB — POCT GLYCOSYLATED HEMOGLOBIN (HGB A1C): Hemoglobin A1C: 11.6 % — AB (ref 4.0–5.6)

## 2021-03-31 LAB — GLUCOSE, CAPILLARY: Glucose-Capillary: 408 mg/dL — ABNORMAL HIGH (ref 70–99)

## 2021-03-31 MED ORDER — DEXCOM G6 SENSOR MISC: 1.0000 | 11 refills | Status: DC

## 2021-03-31 MED ORDER — DEXCOM G6 TRANSMITTER MISC: 1.0000 | 3 refills | Status: DC

## 2021-03-31 MED ORDER — INSULIN GLARGINE 100 UNIT/ML SOLOSTAR PEN: 30.0000 [IU] | PEN_INJECTOR | Freq: Every day | SUBCUTANEOUS | 3 refills | Status: DC

## 2021-03-31 MED ORDER — BD PEN NEEDLE NANO U/F 32G X 4 MM MISC: 6 refills | Status: DC

## 2021-03-31 MED ORDER — HUMALOG KWIKPEN 200 UNIT/ML ~~LOC~~ SOPN: PEN_INJECTOR | SUBCUTANEOUS | 11 refills | Status: DC

## 2021-03-31 MED ORDER — DEXCOM G6 RECEIVER DEVI: 1.0000 | 2 refills | Status: DC

## 2021-03-31 NOTE — Assessment & Plan Note (Signed)
Patient has a history of hypertriglyceridemia with triglyceride in the 500s in the past.  Currently on low intensity statin. -- Continue atorvastatin 10 mg daily -- Repeat lipid panel at next office visit

## 2021-03-31 NOTE — Assessment & Plan Note (Signed)
Patient continues to follow neurology closely. He has been receiving occupational therapy for the last few weeks to months. States his strength has improved significantly. For the last 4 weeks he has been able to grip his insulin and inject himself.  Patient looking forward to having more autonomy as his strength improves. -- Continue to follow neurology -- Continue OT

## 2021-03-31 NOTE — Assessment & Plan Note (Addendum)
21 year old type I diabetic patient here for diabetes follow-up.  Last office visit was October 2022 and A1c was 13.3% at that time. Patient has missed a few appointments since that time.  A1c today improved to 11.6%.  Patient reports that the last 4 weeks, he has gained enough strength in his hand to be able to inject his own insulin.  He denies any headaches, blurry vision, dizziness, syncope, polyuria or polydipsia.  Reports that he is currently taking his Lantus 30 units daily as well as Humalog 30 units 3 times daily with meals.  Reports he sometimes misses some meals.  He also reports he has run out of the CGM transmitter and unable to check his blood sugar at home.  Plan: -- Continue Lantus 30 units daily -- Continue Humalog 30 units 3 times a day with meals -- Refill Dexcom CGM transmitter, sensor and receiver -- Patient advised to apply this to his arm and follow-up in 1 week for download and adjustment of his insulin regimen -- Discontinue Trulicity (pt is a known type I diabetic)  Addendum: Patient initially started on Trulicity for weight loss. Patient has been advised to continue Trulicity. Grandmother states patient still have some pens left.  Sending another prescription to his pharmacy. Pharmacy working Clinical biochemist to get patient his Dexcom. Grandmother instructed to schedule a 1 week appt for patient after he receives and places them.

## 2021-03-31 NOTE — Assessment & Plan Note (Addendum)
Patient's weight is down 4 pounds from 4 months ago. BMI still high at 37. Will consider starting patient on a new GLP-1 at next office visit to help with weight loss. -- Advised lifestyle modifications with dietary changes and exercise as tolerated  Addendum: Patient advised to continue Trulicity for weight loss.

## 2021-03-31 NOTE — Patient Instructions (Addendum)
Thank you, [name] for allowing Lucas Cunningham to provide your care today. Today we discussed your diabetes.  Your A1c is slowly improving but still not at goal.  I am sending new prescriptions for your Dexcom meter.  Pick up this prescription at your local pharmacy and place it on your arm.  We want you to follow-up in 1 week after placing it for Lucas Cunningham to download his meter to make changes to your insulin.  I have ordered the following labs for you:   Lab Orders         Glucose, capillary         BMP8+Anion Gap         POC Hbg A1C      I will call if any are abnormal. All of your labs can be accessed through "My Chart".   I have ordered the following medication/changed the following medications:  Discontinue Trulicity  My Chart Access: [email or web address]  Please follow-up in 1 week  Please make sure to arrive 15 minutes prior to your next appointment. If you arrive late, you may be asked to reschedule.    We look forward to seeing you next time. Please call our clinic at [phone] if you have any questions or concerns. The best time to call is Monday-Friday from 9am-4pm, but there is someone available 24/7. If after hours or the weekend, call the main hospital number and ask for the Internal Medicine Resident On-Call. If you need medication refills, please notify your pharmacy one week in advance and they will send Lucas Cunningham a request.   Thank you for letting Lucas Cunningham take part in your care. Wishing you the best!  Steffanie Rainwater, MD 03/31/2021, 3:12 PM IM Resident, PGY-2 [facility] 41:10

## 2021-03-31 NOTE — Progress Notes (Signed)
° °  CC: Follow-up  HPI:  Mr.Anuar Able is a 21 y.o. male with PMH as below who presents to clinic for follow-up on his diabetes. Please see problem based charting for evaluation, assessment and plan.  Past Medical History:  Diagnosis Date   CIDP (chronic inflammatory demyelinating polyneuropathy) ([facility])    Diabetes mellitus without complication ([facility])    type 1    Review of Systems:  Constitutional: Negative for fatigue or polydipsia Eyes: Negative for visual changes GU: Negative for polyuria Neuro: Positive for chronic upper and lower extremity weakness. Negative for dizziness or headaches.  Physical Exam: General: Pleasant, well-appearing young male.  No acute distress. Cardiac: RRR. No murmurs, rubs or gallops. No LE edema Respiratory: Lungs CTAB. No wheezing or crackles. Skin: Warm, dry and intact without rashes or lesions Extremities: Atraumatic. Full ROM.  2+ radial pulses Neuro: A&O x 3. Moves all extremities. 4/5 strength in all extremities. Weak grip strength.  Normal sensation to gross touch. Psych: Appropriate mood and affect.  Vitals:   03/31/21 1404 03/31/21 1437  BP: (!) 144/70 119/69  Pulse: 92   Temp: 99.2 F (37.3 C)   TempSrc: Oral   SpO2: 96%   Weight: 284 lb (128.8 kg)   Height: 6\' 1"  (1.854 m)      Assessment & Plan:   See Encounters Tab for problem based charting.  Patient discussed with Dr. , MD, MPH

## 2021-03-31 NOTE — Assessment & Plan Note (Signed)
Denies any worsening neuropathy.  Sensation and touch to gross touch on exam. -- Continue gabapentin 300 mg twice daily

## 2021-04-01 LAB — BMP8+ANION GAP
Anion Gap: 18 mmol/L (ref 10.0–18.0)
BUN/Creatinine Ratio: 18 (ref 9–20)
BUN: 9 mg/dL (ref 6–20)
CO2: 21 mmol/L (ref 20–29)
Calcium: 9.9 mg/dL (ref 8.7–10.2)
Chloride: 95 mmol/L — ABNORMAL LOW (ref 96–106)
Creatinine, Ser: 0.5 mg/dL — ABNORMAL LOW (ref 0.76–1.27)
Glucose: 429 mg/dL — ABNORMAL HIGH (ref 70–99)
Potassium: 4.2 mmol/L (ref 3.5–5.2)
Sodium: 134 mmol/L (ref 134–144)
eGFR: 150 mL/min/{1.73_m2} (ref >59)

## 2021-04-04 ENCOUNTER — Ambulatory Visit: Payer: Medicaid Other

## 2021-04-04 ENCOUNTER — Telehealth: Payer: Self-pay

## 2021-04-04 ENCOUNTER — Ambulatory Visit: Payer: Medicaid Other | Admitting: Occupational Therapy

## 2021-04-04 ENCOUNTER — Other Ambulatory Visit: Payer: Self-pay

## 2021-04-04 ENCOUNTER — Encounter: Payer: Self-pay | Admitting: Occupational Therapy

## 2021-04-04 DIAGNOSIS — R2689 Other abnormalities of gait and mobility: Secondary | ICD-10-CM

## 2021-04-04 DIAGNOSIS — R29818 Other symptoms and signs involving the nervous system: Secondary | ICD-10-CM

## 2021-04-04 DIAGNOSIS — R278 Other lack of coordination: Secondary | ICD-10-CM

## 2021-04-04 DIAGNOSIS — M6281 Muscle weakness (generalized): Secondary | ICD-10-CM | POA: Diagnosis not present

## 2021-04-04 DIAGNOSIS — R208 Other disturbances of skin sensation: Secondary | ICD-10-CM

## 2021-04-04 DIAGNOSIS — R2681 Unsteadiness on feet: Secondary | ICD-10-CM

## 2021-04-04 DIAGNOSIS — M25531 Pain in right wrist: Secondary | ICD-10-CM

## 2021-04-04 NOTE — Telephone Encounter (Signed)
° °  Telephone encounter was:  Unsuccessful.  04/04/2021 Name: Rishawn Walck MRN: [id number] DOB: [birth year]  Unsuccessful outbound call made today to assist with:  Transportation Needs  and Food Insecurity  Outreach Attempt:  1st Attempt  A HIPAA compliant voice message was left requesting a return call.  Instructed patient to call back at [phone] at their earliest convenience.  [facility] Guide, Embedded Care Coordination [facility]  [place], [place] [address]  Main Phone: [phone]   E-mail: [email or web address]  Website: [email or web address]

## 2021-04-04 NOTE — Therapy (Signed)
[facility] [address] [place], [place], [address] Phone: [phone]   Fax:  [phone]  Physical Therapy Treatment/Re-Certification  Patient Details  Name: Lucas Cunningham MRN: [id number] Date of Birth: 2000/07/12 Referring Provider (PT): Sarina Ill, MD   Encounter Date: 04/04/2021   PT End of Session - 04/04/21 1018     Visit Number 9    Number of Visits 17    Date for PT Re-Evaluation 06/03/21    Authorization Type [employer] MCD    Authorization Time Period 12 visits 03/02/21-04/13/21;    Authorization - Visit Number 8    Authorization - Number of Visits 12    PT Start Time 1016    PT Stop Time 1056    PT Time Calculation (min) 40 min    Equipment Utilized During Treatment Gait belt    Activity Tolerance Patient tolerated treatment well;No increased pain    Behavior During Therapy WFL for tasks assessed/performed             Past Medical History:  Diagnosis Date   CIDP (chronic inflammatory demyelinating polyneuropathy) (HCC)    Diabetes mellitus without complication ([place])    type 1    Past Surgical History:  Procedure Laterality Date   NO PAST SURGERIES      There were no vitals filed for this visit.   Subjective Assessment - 04/04/21 1018     Subjective No new changes/complaints. Was able to play video games. No pain or falls to report.    Pertinent History Chronic inflammatory demyelinating polyneuropathy, Uncontrolled DM Type 1, ADHD    Limitations Standing;Walking;House hold activities    Patient Stated Goals Improve balance with Standing; Get Stronger.    Currently in Pain? No/denies                [facility] PT Assessment - 04/04/21 0001       Assessment   Medical Diagnosis CIPD    Referring Provider (PT) Sarina Ill, MD    Onset Date/Surgical Date 01/23/21      Timed Up and Go Test   TUG Normal TUG    Normal TUG (seconds) 9.38   no AD                           [facility] Adult PT Treatment/Exercise - 04/04/21 0001       Transfers   Transfers Sit to Stand;Stand to Sit    Sit to Stand 5: Supervision    Five time sit to stand comments  12.12 secs no UE support from mat at standard height    Stand to Sit 5: Supervision      Ambulation/Gait   Ambulation/Gait Yes    Ambulation/Gait Assistance 5: Supervision    Ambulation/Gait Assistance Details ambulation into session with RW: throughout session without AD. completed ambulation outdoors on unlevel surfaces (paved/grass) x 800 ft with no AD. no unsteadiness noted today with ambulation.    Ambulation Distance (Feet) 800 Feet    Assistive device None;Rolling walker    Gait Pattern Step-through pattern;Decreased stride length;Decreased dorsiflexion - right;Decreased dorsiflexion - left;Wide base of support    Ambulation Surface Level;Indoor;Unlevel;Outdoor;Paved;Grass    Gait velocity 10.06 secs = 3.26 ft/sec no AD    Curb 5: Supervision    Curb Details (indicate cue type and reason) indoors completed curb negotiation x 3 reps,  more challenge ntoed with this due to height. Require CGA intermittent. Cues to step up  with RLE.      Balance   Balance Assessed Yes      Dynamic Standing Balance   Dynamic Standing - Balance Support No upper extremity supported    Dynamic Standing - Level of Assistance 5: Stand by assistance    Dynamic Standing - Balance Activities Head turns;Head nods;Reaching for objects    Reaching for objects comments: standing reaching for target PT provided x 10 reps, across midline with BUE.    Head turns comments: x 10 reps R/L    Head nods comments: x 10 reps Up/Down      Exercises   Exercises Knee/Hip      Knee/Hip Exercises: Aerobic   Other Aerobic Scifit with BLE's only level 5.5 x 7 minutes with goal >/= 80 steps per minute for strengthening and activity tolerance.                PT Education - 04/04/21 1022     Education Details  progress toward LTGs; updated POC to continue at [facility]) Educated Patient    Methods Explanation    Comprehension Verbalized understanding              PT Short Term Goals - 03/27/21 1648       PT SHORT TERM GOAL #1   Title Pt will be IND with initial HEP to demo improved function and balance (ALL STGs Due: 03/18/21)    Baseline 03/25/21: met with current HEP    Status Achieved    Target Date 03/18/21      PT SHORT TERM GOAL #2   Title Pt will be able to stand statically without support at [facility] for >/= 1 min 30 seconds with supervision in order to indicate improved balance and strength.    Baseline 03/25/21: met in session today    Status Achieved      PT SHORT TERM GOAL #3   Title Pt will improve gait speed to >/= 2.5 ft/sec w/ LRAD and B AFO as needed to indicate dec fall risk.    Baseline 03/25/21: 3.17 ft/sec no AD, bil braces only    Status Achieved      PT SHORT TERM GOAL #4   Title Pt will improve 5TSS to </= 20 secs with BUE support in order to indicate improved strength.    Baseline 03/25/21: 13.63 sec's no UE support from standard height surface    Status Achieved               PT Long Term Goals - 04/04/21 1028       PT LONG TERM GOAL #1   Title Pt will be IND with final HEP in order to indicate improved functional mobility and dec fall risk.  ALL LTGS DUE 04/08/21    Baseline no HEP established; reports independence with HEP, will benefit from progressive HEP    Time 6    Period Weeks    Status Achieved    Target Date 04/08/21      PT LONG TERM GOAL #2   Title Pt will improve gait speed to >/=3.30 ft/sec with LRAD and B AFOs as needed in order to indicate safe community ambulation.    Baseline 2.35 ft/sec; 3.26 ft/sec    Time 6    Period Weeks    Status Partially Met      PT LONG TERM GOAL #3   Title Pt will improve 5TSS to </=12 secs with single UE support only in order  to indicate improved functional strength.    Baseline 22.62 secs;  12.12 secs    Time 6    Period Weeks    Status Achieved      PT LONG TERM GOAL #4   Title Pt will improve TUG to </= 15 seconds with LRAD to demo improved balance and reduced fall risk    Baseline 18.57 secs; 9.38 secs with No AD    Time 6    Period Weeks    Status Achieved      PT LONG TERM GOAL #5   Title Pt will perform dynamic standing balance without UE support x 4 mins in order to perform ADLs safely at home.    Baseline stand dynamic without UE support x 1 minute; dynamic balance without UE support x 5 minutes    Time 6    Period Weeks    Status Achieved      PT LONG TERM GOAL #6   Title Pt will be able to ambulate x 500 over level and unlevel surfaces w/ LRAD and B AFO's to demo improved safe community mobility    Baseline 100 ft indoors with RW; 800 ft outdoors no AD grass/paved surfaces    Time 6    Period Weeks    Status Achieved             Updated Short Term Goals:   PT Short Term Goals - 04/04/21 1231       PT SHORT TERM GOAL #1   Title Pt will be independent with daily walking program, completing >/= 3x/weekly (ALL STGs Due: 05/06/21)    Baseline current HEP established, no walking program    Time 4    Period Weeks    Status New    Target Date 05/06/21      PT SHORT TERM GOAL #2   Title DGI TBA and STG/LTG to be assessed    Baseline TBA    Time 4    Period Weeks    Status Revised      PT SHORT TERM GOAL #3   Title --    Baseline --    Status --      PT SHORT TERM GOAL #4   Title --    Baseline --    Status --             Updated Long Term Goals:  PT Long Term Goals - 04/04/21 1235       PT LONG TERM GOAL #1   Title Pt will be IND with final HEP and daily walking program for improved functional mobility and dec fall risk.  (ALL LTGS DUE 06/03/21)    Baseline no HEP established; reports independence with HEP, will benefit from progressive HEP    Time 8    Period Weeks    Status Achieved    Target Date 06/03/21      PT LONG TERM  GOAL #2   Title Pt will improve gait speed to >/=3.40 ft/sec without AD and B AFOs as needed in order to indicate safe community ambulation.    Baseline 2.35 ft/sec; 3.26 ft/sec    Time 8    Period Weeks    Status Revised      PT LONG TERM GOAL #3   Title Pt will improve 5TSS to </=10 secs with single UE support only in order to indicate improved functional strength.    Baseline 22.62 secs; 12.12 secs    Time 8  Period Weeks    Status Revised      PT LONG TERM GOAL #4   Title Pt will be able to ascend/descend curb without use of AD and Mod I for improved community mobility    Baseline CGA with curb negotiation, intermittent UE support    Time 8    Period Weeks    Status New      PT LONG TERM GOAL #5   Title LTG to be set for DGI as applicable    Baseline TBA    Time 8    Period Weeks    Status New                 Plan - 04/04/21 1217     Clinical Impression Statement Today's skilled PT session focused on assesment of patient's progress toward LTGs. Patient able to meet/partially meet all LTGs. Patient improving 5x sit <> stand in 12.12 secs, TUG in 9.38 seconds, and gait speed to 3.26 ft/sec. All activities completed without use of AD. Patient is making significant progress with PT services, demonstrating improved balance and reduced fall risk. Pt will continue to benefit from skilled PT services and progress toward all updated goals.    Personal Factors and Comorbidities Comorbidity 2    Comorbidities Chronic inflammatory demyelinating polyneuropathy, Uncontrolled DM Type 1, ADHD    Examination-Activity Limitations Dressing;Transfers;Stairs;Stand;Locomotion Level;Bend;Lift    Examination-Participation Restrictions Community Activity;Cleaning;Laundry;Meal Prep    Stability/Clinical Decision Making Evolving/Moderate complexity    Rehab Potential Good    PT Frequency 1x / week    PT Duration 8 weeks   2x/week for 1 week, followed by 1x/week for 8 weeks   PT  Treatment/Interventions ADLs/Self Care Home Management;Aquatic Therapy;Cryotherapy;Moist Heat;DME Instruction;Gait training;Stair training;Functional mobility training;Therapeutic activities;Therapeutic exercise;Balance training;Neuromuscular re-education;Patient/family education;Orthotic Fit/Training;Manual techniques;Passive range of motion    PT Next Visit Plan Review and update HEP. Continue curb negotiation without UE support, especially indoors due to taller height. Continue gait without AD. Obstacle Negotiation, SLS activities. Progress HEP for balance, gait with less restrictive device, gait outdoors, balance on compliant surfaces    PT Home Exercise Plan [id number]    Consulted and Agree with Plan of Care Patient             Patient will benefit from skilled therapeutic intervention in order to improve the following deficits and impairments:  Abnormal gait, Decreased balance, Difficulty walking, Impaired sensation, Decreased strength, Decreased activity tolerance, Impaired UE functional use, Decreased endurance, Decreased knowledge of precautions, Decreased knowledge of use of DME, Impaired perceived functional ability  Visit Diagnosis: Unsteadiness on feet  Other lack of coordination  Muscle weakness (generalized)  Other abnormalities of gait and mobility     Problem List Patient Active Problem List   Diagnosis Date Noted   Proteinuria 11/23/2020   CIDP (chronic inflammatory demyelinating polyneuropathy) ([place]) 05/25/2020   Noncompliance with medication treatment due to difficulty with dosing 05/25/2020   Mixed hyperlipidemia 05/25/2020   Depression 05/25/2020   Diabetic polyneuropathy associated with type 1 diabetes mellitus ([place]) 02/26/2019   Postprandial bloating 02/25/2019   Sensory problems with limbs 02/25/2019   Uncontrolled type 1 diabetes mellitus with hyperglycemia ([place]) 05/24/2017   Morbid obesity ([place]) 05/24/2017    Jones Bales, PT, DPT 04/04/2021,  12:28 PM  [facility] [address] [place], [place], [address] Phone: [phone]   Fax:  [phone]  Name: Lucas Cunningham MRN: [id number] Date of Birth: [birth year]

## 2021-04-04 NOTE — Therapy (Signed)
[facility] [address] [place], [place], [address] Phone: [phone]   Fax:  [phone]  Occupational Therapy Treatment  Patient Details  Name: Lucas Cunningham MRN: [id number] Date of Birth: 19-Oct-2000 Referring Provider (OT): Delfin Edis   Encounter Date: 04/04/2021   OT End of Session - 04/04/21 1057     Visit Number 9    Number of Visits 17    Date for OT Re-Evaluation 05/16/21   +8 weeks at recert 05/16/66   Authorization Type Per front desk, pt is now [employer]. 03/21/21    Authorization - Visit Number 9    Progress Note Due on Visit 16    OT Start Time 1056    OT Stop Time 1140    OT Time Calculation (min) 44 min    Activity Tolerance Patient tolerated treatment well    Behavior During Therapy WFL for tasks assessed/performed             Past Medical History:  Diagnosis Date   CIDP (chronic inflammatory demyelinating polyneuropathy) ([place])    Diabetes mellitus without complication ([place])    type 1    Past Surgical History:  Procedure Laterality Date   NO PAST SURGERIES      There were no vitals filed for this visit.                 OT Treatments/Exercises (OP) - 04/04/21 1115       Hand Exercises   Other Hand Exercises resistance clothespins - completed yellow and red with LUE and remainder with RUE. Pt wih min difficulty d/t wrist extension    Other Hand Exercises hand gripper with RUE with level 1 wih black spring upgraded to level 2 d/t increased grip strength in RUE - pt attempted with LUE however unable to sustain grasp d/t weak wrist extension      Fine Motor Coordination (Hand/Wrist)   Fine Motor Coordination In hand manipuation training;Dealing card with thumb    In Hand Manipulation Training with connect four chips with RUE and LUE. Pt completed with moderate drops and mod difficulty with RUE and max difficulty and drops with LUE however improved as task  continued.    Manipulation of small objects TYPING TEST on Typing Master with 14 wpm with 69% accuracy with 10 wpm NET SPEED   Improved from 2/6 with 20% accuracy and 2 wpm NET SPEED   Dealing card with thumb with RUE and rotating card in RUE - mod difficulty with rotating. Able to deal with thumb with min difficulty/drops                    OT Education - 04/04/21 1140     Education Details gave yellow theraputty - unable to review HEP from last time d/t safety drill.    Person(s) Educated Patient    Methods Explanation;Handout    Comprehension Verbalized understanding;Need further instruction              OT Short Term Goals - 03/21/21 1103       OT SHORT TERM GOAL #1   Title Patient will demonstrate ability to put on and take off his socks with mod assist    Baseline Dependent    Time 4    Period Weeks    Status Achieved   pt putting on low socks independently and consistently per report 03/18/21   Target Date 04/10/21      OT SHORT TERM  GOAL #2   Title Patient will demonstrate ability to pull up zipper once zipper closed, or Pull down zipper on jacket with min assist    Baseline Dependent    Time 4    Period Weeks    Status On-going   pt reports being able to pull zipper up and down once clasp is managed but has not attempted clasping independently - will attempt. pt not wearing zip up clothing today. 03/18/21     OT SHORT TERM GOAL #3   Title Patient will demonstrate ability to legibly print a sentence with dominant right hand and built up grip    Baseline Able to print his name 75% legibility    Time 4    Period Weeks    Status Achieved      OT SHORT TERM GOAL #4   Title Patient will demonstrate ability to type on computer, or navigate controls for gaming with set up and increased time    Baseline Unable to type, use computer consistently    Time 4    Period Weeks    Status On-going   attempted typing test on typing master - 03/21/21     OT SHORT TERM GOAL  #5   Title xxx    Baseline xxx               OT Long Term Goals - 04/04/21 1131       OT LONG TERM GOAL #1   Title Patient will don/doff socks with modified independence    Baseline dependent    Time 8    Period Weeks    Status Achieved   completing with mod I with low socks 03/21/21, 04/04/21   Target Date 05/10/21      OT LONG TERM GOAL #2   Title .Patient will perform simple cooking on stovetop with supervision and intermittent assistance    Baseline Is not completing  - dependent on grandmother    Time 8    Period Weeks    Status Achieved      OT LONG TERM GOAL #3   Title Patient will improve grip strength to allow him to open a chip bag with his hands    Baseline Uses his teeth to tear open packages    Time 8    Period Weeks    Status On-going   reports doing this but it's a struggle - Continue to assess for consistency 03/21/21, 04/04/21     OT LONG TERM GOAL #4   Title Patient will demonstrate improved fine motor control such that he can complete the 9 hole peg test in less than 2 min with RUE. REVISED: RUE complete in 70 seconds or less.    Baseline RUE - Just under 1 min for 3 pegs    Time 8    Period Weeks    Status Revised   will revise to finish in 70 seconds or less with RUE.     OT LONG TERM GOAL #5   Title Patient will demonstrate 75% active wrist extension against gravity in LUE to help with grip strength    Baseline Cannot extend wrist against gravity    Time 8    Period Weeks    Status On-going   75% of active wrist extension 04/04/21     OT LONG TERM GOAL #6   Title Patient will demobnstrate a 5 lb increase in grip strength in BUE. REVISE - 10 lbs total, 5 additional lbs BUE  Baseline R 26, L 4, 03/21/21 R 31.9, L 9.7    Time 8    Period Weeks    Status Revised   MET AND REVISE : increase grip strength by 5 additional lbs in BUE.     OT LONG TERM GOAL #7   Title Pt will be compliant and verbalize understanding of wear and care instructions for any  splints and/or orthoses PRN    Baseline Patient's custom splint was damaged and is no longer useable    Time 8    Period Weeks    Status On-going   have not seen need - will continue to address 03/21/21                   Patient will benefit from skilled therapeutic intervention in order to improve the following deficits and impairments:           Visit Diagnosis: Unsteadiness on feet  Other lack of coordination  Muscle weakness (generalized)  Other abnormalities of gait and mobility  Pain in right wrist  Other disturbances of skin sensation  Other symptoms and signs involving the nervous system    Problem List Patient Active Problem List   Diagnosis Date Noted   Proteinuria 11/23/2020   CIDP (chronic inflammatory demyelinating polyneuropathy) ([place]) 05/25/2020   Noncompliance with medication treatment due to difficulty with dosing 05/25/2020   Mixed hyperlipidemia 05/25/2020   Depression 05/25/2020   Diabetic polyneuropathy associated with type 1 diabetes mellitus ([place]) 02/26/2019   Postprandial bloating 02/25/2019   Sensory problems with limbs 02/25/2019   Uncontrolled type 1 diabetes mellitus with hyperglycemia ([place]) 05/24/2017   Morbid obesity ([place]) 05/24/2017    Zachery Conch, OT 04/04/2021, 11:40 AM  [facility] [address] [place], [place], [address] Phone: [phone]   Fax:  [phone]  Name: Lucas Cunningham MRN: [id number] Date of Birth: 2000-11-01

## 2021-04-06 ENCOUNTER — Ambulatory Visit: Payer: Medicaid Other | Admitting: Occupational Therapy

## 2021-04-06 ENCOUNTER — Ambulatory Visit: Payer: Medicaid Other | Admitting: Physical Therapy

## 2021-04-06 ENCOUNTER — Telehealth: Payer: Self-pay

## 2021-04-06 NOTE — Telephone Encounter (Signed)
° °  Telephone encounter was:  Unsuccessful.  04/06/2021 Name: Denys Steitz MRN: [id number] DOB: 29-Nov-2000  Unsuccessful outbound call made today to assist with:  Transportation Needs  and Food Insecurity  Outreach Attempt:  2nd Attempt  A HIPAA compliant voice message was left requesting a return call.  Instructed patient to call back at [phone] at their earliest convenience.  [facility] management  [place], [place]  Main Phone: [phone]   E-mail: [email or web address]  Website: [email or web address]

## 2021-04-07 ENCOUNTER — Telehealth: Payer: Self-pay

## 2021-04-07 ENCOUNTER — Encounter: Payer: Medicaid Other | Admitting: Student

## 2021-04-07 ENCOUNTER — Encounter: Payer: Self-pay | Admitting: Student

## 2021-04-07 NOTE — Telephone Encounter (Signed)
° °  Telephone encounter was:  Unsuccessful.  04/07/2021 Name: Lucas Cunningham MRN: [id number] DOB: 04-10-2000  Unsuccessful outbound call made today to assist with:  Transportation Needs  and Food Insecurity  Outreach Attempt:  3rd Attempt.  Referral closed unable to contact patient.  A HIPAA compliant voice message was left requesting a return call.  Instructed patient to call back at [phone] at their earliest convenience.  [facility] management  [place], [place]  Main Phone: [phone]   E-mail: [email or web address]  Website: [email or web address]

## 2021-04-08 ENCOUNTER — Telehealth: Payer: Self-pay

## 2021-04-08 NOTE — Telephone Encounter (Signed)
Pa for pt ( DEXCOM G6  sensor  ) came though on  cover my meds was submitted with office notes and labs .. awaiting approval or denial ..      DECISION :    Approvedtoday PA Case: [id number], Status: Approved, Coverage Starts on: 04/08/2021 12:00:00 AM, Coverage Ends on: 10/05/2021 12:00:00 AM.  .............................................................................    Pa for pt ( DEXCOM G6 TRANSMITTER )  came through on cover my meds was submitted     DECISION :  Available without authorization.  ...........................................................................  Pa for pt [facility] RECEIVER ) came through on cover my meds was submitted   DECISION :    Available without authorization.       ( COPIES SENT TO PHARMACY ALSO )

## 2021-04-11 ENCOUNTER — Ambulatory Visit: Payer: Medicaid Other | Admitting: Occupational Therapy

## 2021-04-11 ENCOUNTER — Ambulatory Visit: Payer: Medicaid Other

## 2021-04-13 ENCOUNTER — Other Ambulatory Visit: Payer: Self-pay | Admitting: *Deleted

## 2021-04-13 NOTE — Patient Outreach (Signed)
Care Coordination ? ?04/13/2021 ? ?[name] ?Feb 16, 2000 ?[id number] ? ? ?Medicaid Managed Care  ? ?Unsuccessful Outreach Note ? ?04/13/2021 ?Name: Lucas Cunningham MRN: [id number] DOB: 11/19/2000 ? ?Referred by: Steffanie Rainwater, MD ?Reason for referral : High Risk Managed Medicaid (Unsuccessful RNCM follow up telephone outreach) ? ? ?An unsuccessful telephone outreach was attempted today. The patient was referred to the case management team for assistance with care management and care coordination.  ? ?Follow Up Plan: A HIPAA compliant phone message was left for the patient providing contact information and requesting a return call.  ? ?[name] RN, BSN ?[facility] ?RN Care Coordinator ? ? ?

## 2021-04-13 NOTE — Patient Instructions (Signed)
Visit Information ? ?Mr. [employer]  - as a part of your Medicaid benefit, you are eligible for care management and care coordination services at no cost or copay. I was unable to reach you by phone today but would be happy to help you with your health related needs. Please feel free to call me @ [phone].  ? ?A member of the Managed Medicaid care management team will reach out to you again over the next 14 days.  ? ?Lurena Joiner RN, BSN ?[facility] ?RN Care Coordinator ?  ?

## 2021-04-13 NOTE — Progress Notes (Signed)
Internal Medicine Clinic Attending  Case discussed with Dr. Kirke Corin  At the time of the visit.  We reviewed the residents history and exam and pertinent patient test results.  I agree with the assessment, diagnosis, and plan of care documented in the residents note.    Correction- patient has obesity and is on the GLP1 for this. We will continue dulaglutide

## 2021-04-14 ENCOUNTER — Other Ambulatory Visit: Payer: Self-pay | Admitting: Student

## 2021-04-14 MED ORDER — TRULICITY 1.5 MG/0.5ML ~~LOC~~ SOAJ: 1.5000 mg | SUBCUTANEOUS | 3 refills | Status: DC

## 2021-04-14 NOTE — Addendum Note (Signed)
Addended bySharrell Ku on: 04/14/2021 03:50 PM   Modules accepted: Orders

## 2021-04-19 ENCOUNTER — Ambulatory Visit: Payer: Medicaid Other | Admitting: Occupational Therapy

## 2021-04-19 ENCOUNTER — Other Ambulatory Visit: Payer: Self-pay

## 2021-04-19 ENCOUNTER — Ambulatory Visit: Payer: Medicaid Other | Attending: Internal Medicine

## 2021-04-19 ENCOUNTER — Encounter: Payer: Self-pay | Admitting: Occupational Therapy

## 2021-04-19 DIAGNOSIS — M25531 Pain in right wrist: Secondary | ICD-10-CM | POA: Diagnosis present

## 2021-04-19 DIAGNOSIS — M6281 Muscle weakness (generalized): Secondary | ICD-10-CM | POA: Insufficient documentation

## 2021-04-19 DIAGNOSIS — R29818 Other symptoms and signs involving the nervous system: Secondary | ICD-10-CM

## 2021-04-19 DIAGNOSIS — R208 Other disturbances of skin sensation: Secondary | ICD-10-CM

## 2021-04-19 DIAGNOSIS — R2681 Unsteadiness on feet: Secondary | ICD-10-CM | POA: Insufficient documentation

## 2021-04-19 DIAGNOSIS — R2689 Other abnormalities of gait and mobility: Secondary | ICD-10-CM | POA: Diagnosis present

## 2021-04-19 DIAGNOSIS — R278 Other lack of coordination: Secondary | ICD-10-CM | POA: Insufficient documentation

## 2021-04-19 NOTE — Therapy (Signed)
[facility] ?[place], [place], [address] ?Phone: [phone]   Fax:  [phone] ? ?Occupational Therapy Treatment ? ?Patient Details  ?Name: Lucas Cunningham ?MRN: [id number] ?Date of Birth: 2000/03/03 ?Referring Provider (OT): Delfin Edis ? ? ?Encounter Date: 04/19/2021 ? ? OT End of Session - 04/19/21 1457   ? ? Visit Number 10   ? Number of Visits 17   ? Date for OT Re-Evaluation 05/16/21   ? Authorization Type Per front desk, pt is now [employer]. 03/21/21   ? Authorization Time Period Medicaid - 6 visits approved 03-02-21 - 05/13/21 , submitted for 11 additional visits 03/21/2021   ? Authorization - Visit Number 10   ? Authorization - Number of Visits 7   ? Progress Note Due on Visit 16   ? OT Start Time 1400   ? OT Stop Time 1440   ? OT Time Calculation (min) 40 min   ? Activity Tolerance Patient tolerated treatment well   ? Behavior During Therapy [facility] for tasks assessed/performed   ? ?  ?  ? ?  ? ? ?Past Medical History:  ?Diagnosis Date  ? CIDP (chronic inflammatory demyelinating polyneuropathy) (HCC)   ? Diabetes mellitus without complication ([place])   ? type 1  ? ? ?Past Surgical History:  ?Procedure Laterality Date  ? NO PAST SURGERIES    ? ? ?There were no vitals filed for this visit. ? ? Subjective Assessment - 04/19/21 1359   ? ? Subjective  I can open up chip bags now.   ? Currently in Pain? No/denies   ? ?  ?  ? ?  ? ? ? ? ? [place] OT Assessment - 04/19/21 0001   ? ?  ? Hand Function  ? Right Hand Grip (lbs) 32.6   ? Right Hand Lateral Pinch 6 lbs   ? Left Hand Grip (lbs) 20   ? Left Hand Lateral Pinch 6 lbs   ? ?  ?  ? ?  ? ? ? ? ? ? ? ? ? ? ? OT Treatments/Exercises (OP) - 04/19/21 0001   ? ?  ? ADLs  ? Writing Worked on handwriting without built up grip.  Patient with excessive IP thumb flexion.  Used Oval 8 splint to reduce thumb IP extension.  Patient able to write several full sentences legibly.   ? ADL Comments  Reviewing goals - patient has met nearly all of his short term goals - still has difficulty with P5 Game controller.  Patient has already met some of his long term goals.  Patient is showing consistent improvement with functional use of hands.   ?  ? Exercises  ? Exercises Theraputty   upgraded to red for right hand  ?  ? Neurological Re-education Exercises  ? Other Exercises 1 Patient showing improved functional reaction time in BUE.  Able to toss and catch a ball, then "juggle" with 2 balls in seated, then in standing.  Patient with frequent drops but when challenged able to catch 11 tosses in a row.   ? ?  ?  ? ?  ? ? ? ? ? ? ? ? ? ? ? OT Short Term Goals - 04/19/21 1359   ? ?  ? OT SHORT TERM GOAL #1  ? Title Patient will demonstrate ability to put on and take off his socks with mod assist   ? Baseline Dependent   ? Time 4   ?  Period Weeks   ? Status Achieved   ? Target Date 04/10/21   ?  ? OT SHORT TERM GOAL #2  ? Title Patient will demonstrate ability to pull up zipper once zipper closed, or Pull down zipper on jacket with min assist   ? Baseline Dependent   ? Time 4   ? Period Weeks   ? Status Achieved   ?  ? OT SHORT TERM GOAL #3  ? Title Patient will demonstrate ability to legibly print a sentence with dominant right hand and built up grip   ? Baseline Able to print his name 75% legibility   ? Time 4   ? Period Weeks   ? Status Achieved   ?  ? OT SHORT TERM GOAL #4  ? Title Patient will demonstrate ability to type on computer, or navigate controls for gaming with set up and increased time   ? Baseline Unable to type, use computer consistently   ? Time 4   ? Period Weeks   ? Status Achieved   ? ?  ?  ? ?  ? ? ? ? OT Long Term Goals - 04/19/21 1408   ? ?  ? OT LONG TERM GOAL #1  ? Title Patient will don/doff socks with modified independence   ? Baseline dependent   ? Time 8   ? Period Weeks   ? Status Achieved   ?  ? OT LONG TERM GOAL #2  ? Title .Patient will perform simple cooking on stovetop with  supervision and intermittent assistance   ? Baseline Is not completing  - dependent on grandmother   ? Time 8   ? Period Weeks   ? Status Achieved   ?  ? OT LONG TERM GOAL #3  ? Title Patient will improve grip strength to allow him to open a chip bag with his hands   ? Baseline Uses his teeth to tear open packages   ? Time 8   ? Period Weeks   ? Status Achieved   ?  ? OT LONG TERM GOAL #5  ? Title Patient will demonstrate 75% active wrist extension against gravity in LUE to help with grip strength   ? Baseline Cannot extend wrist against gravity   ? Time 8   ? Period Weeks   ? Status Achieved   ?  ? OT LONG TERM GOAL #6  ? Title Patient will demobnstrate a 5 lb increase in grip strength in BUE. REVISE - 10 lbs total, 5 additional lbs BUE   ? Baseline R 26, L 4, 03/21/21 R 31.9, L 9.7   ? Time 8   ? Period Weeks   ? Status Achieved   ? ?  ?  ? ?  ? ? ? ? ? ? ? ? Plan - 04/19/21 1506   ? ? Clinical Impression Statement Pt pleased with progress thus far.  Continuing to show steady functional improvement   ? OT Occupational Profile and History Detailed Assessment- Review of Records and additional review of physical, cognitive, psychosocial history related to current functional performance   ? Occupational performance deficits (Please refer to evaluation for details): ADL's;IADL's   ? Body Structure / Function / Physical Skills ADL;Decreased knowledge of use of DME;Strength;Balance;Dexterity;GMC;Tone;UE functional use;Body mechanics;Endurance;IADL;ROM;Sensation;Mobility;Flexibility;Coordination;Decreased knowledge of precautions;FMC   ? Rehab Potential Good   ? Clinical Decision Making Several treatment options, min-mod task modification necessary   ? Comorbidities Affecting Occupational Performance: May have comorbidities impacting occupational performance   ?  Modification or Assistance to Complete Evaluation  No modification of tasks or assist necessary to complete eval   ? OT Frequency 2x / week   ? OT Duration 8 weeks    ? OT Treatment/Interventions Self-care/ADL training;Aquatic Therapy;DME and/or AE instruction;Splinting;Balance training;Therapeutic activities;Therapeutic exercise;Passive range of motion;Functional Mobility Training;Neuromuscular education;Electrical Stimulation;Manual Therapy;Patient/family education   ? Plan folding laundry, BUE coordination, strengthening, LUE wrist extension   ? Consulted and Agree with Plan of Care Patient   ? ?  ?  ? ?  ? ? ?Patient will benefit from skilled therapeutic intervention in order to improve the following deficits and impairments:   ?Body Structure / Function / Physical Skills: ADL, Decreased knowledge of use of DME, Strength, Balance, Dexterity, GMC, Tone, UE functional use, Body mechanics, Endurance, IADL, ROM, Sensation, Mobility, Flexibility, Coordination, Decreased knowledge of precautions, FMC ?  ?  ? ? ?Visit Diagnosis: ?Other lack of coordination ? ?Muscle weakness (generalized) ? ?Pain in right wrist ? ?Other disturbances of skin sensation ? ?Other symptoms and signs involving the nervous system ? ?Unsteadiness on feet ? ? ? ?Problem List ?Patient Active Problem List  ? Diagnosis Date Noted  ? Proteinuria 11/23/2020  ? CIDP (chronic inflammatory demyelinating polyneuropathy) ([place]) 05/25/2020  ? Noncompliance with medication treatment due to difficulty with dosing 05/25/2020  ? Mixed hyperlipidemia 05/25/2020  ? Depression 05/25/2020  ? Diabetic polyneuropathy associated with type 1 diabetes mellitus ([place]) 02/26/2019  ? Postprandial bloating 02/25/2019  ? Sensory problems with limbs 02/25/2019  ? Uncontrolled type 1 diabetes mellitus with hyperglycemia ([place]) 05/24/2017  ? Morbid obesity ([place]) 05/24/2017  ? ? ?Mariah Milling, OT ?04/19/2021, 3:10 PM ? ?[facility] ?[place] ?[place], [place], [address] ?Phone: [phone]   Fax:  [phone] ? ?Name: Lucas Cunningham ?MRN: [id number] ?Date of Birth:  2000-06-28 ? ?

## 2021-04-19 NOTE — Therapy (Signed)
?OUTPATIENT PHYSICAL THERAPY TREATMENT NOTE ? ? ?Patient Name: Lucas Cunningham ?MRN: [id number] ?DOB:2000/03/22, 21 y.o., male ?Today's Date: 04/19/2021 ? ?PCP: Steffanie Rainwater, MD ?REFERRING PROVIDER: Earl Lagos, MD ? ? PT End of Session - 04/19/21 1314   ? ? Visit Number 10   ? Number of Visits 17   ? Date for PT Re-Evaluation 06/03/21   ? Authorization Type [employer] MCD   ? Authorization Time Period 12 visits 03/02/21-04/13/21; awiting new authorization   ? Authorization - Visit Number 9   ? Authorization - Number of Visits 12   ? PT Start Time 1315   ? PT Stop Time 1357   ? PT Time Calculation (min) 42 min   ? Equipment Utilized During Treatment Gait belt   ? Activity Tolerance Patient tolerated treatment well;No increased pain   ? Behavior During Therapy [facility] for tasks assessed/performed   ? ?  ?  ? ?  ? ? ?Past Medical History:  ?Diagnosis Date  ? CIDP (chronic inflammatory demyelinating polyneuropathy) (HCC)   ? Diabetes mellitus without complication (HCC)   ? type 1  ? ?Past Surgical History:  ?Procedure Laterality Date  ? NO PAST SURGERIES    ? ?Patient Active Problem List  ? Diagnosis Date Noted  ? Proteinuria 11/23/2020  ? CIDP (chronic inflammatory demyelinating polyneuropathy) (HCC) 05/25/2020  ? Noncompliance with medication treatment due to difficulty with dosing 05/25/2020  ? Mixed hyperlipidemia 05/25/2020  ? Depression 05/25/2020  ? Diabetic polyneuropathy associated with type 1 diabetes mellitus (HCC) 02/26/2019  ? Postprandial bloating 02/25/2019  ? Sensory problems with limbs 02/25/2019  ? Uncontrolled type 1 diabetes mellitus with hyperglycemia (HCC) 05/24/2017  ? Morbid obesity (HCC) 05/24/2017  ? ? ?REFERRING DIAG: G61.81 (ICD-10-CM) - CIDP (chronic inflammatory demyelinating polyneuropathy) (HCC) ? ?THERAPY DIAG:  ?Unsteadiness on feet ? ?Other lack of coordination ? ?Muscle weakness (generalized) ? ?Other abnormalities of gait and mobility ? ?PERTINENT HISTORY: Chronic inflammatory  demyelinating polyneuropathy, Uncontrolled DM Type 1, ADHD  ? ?PRECAUTIONS: Fall ? ?SUBJECTIVE: Patient reports no new changes/complaints. Reports he has been walking and stepping up on curbs with out the walker. Is doing great. No pain. No falls.  ? ?PAIN:  ?Are you having pain? No ? ? ?TODAY'S TREATMENT:  ?There Ex:  ?Completed gait on treadmill working on gradual increase in gait speed. Completed on 1.3 mph x 3 minutes with BUE. Trialed gradual reduction of UE support, with balance challenge noted, requiring intermittent touch A to hand rails. Then progressed to 1.8 mph and completed additional 3 minutes. And then again progressed to 2.0 mph and completed additional 3 minutes. Pt tolerating increase in resistance well. Seated rest break required after completion.  ? ?NMR:  ?Completed sit <> stand training without UE support from low surface, completed x 10 reps. Then second set completed additional 10 reps with BLE placed on blue mat.   ? ?Side Stepping: completed lateral side stepping without UE support, completed 4 x 15'. Cues to keep toes pointed forward. Standing rest break required after completion ?Backwards Walking: completed backwards walking without UE support, 4 x 15', cues for step length. Intermittent balance challenge noted.  ?Marching: in // bars but no use of UE, completed alternating marching forwards 4 x 10'.  ? ?Lower Extremity Strengthening:  ? Lateral Step Ups: BLE, 6", Sets: 1, Reps: 10 on BLE. Completed with 3# ankle weights on BLE. ?Forward Step Ups: BLE, 6", Sets: 1, Reps: 15, with alternating step up with opposite knee drive.  Completed with 3# ankle weights on BLE ?  ? ?PATIENT EDUCATION: ?Education details: Continue HEP; ?Person educated: Patient ?Education method: Explanation ?Education comprehension: verbalized understanding ? ? ?HOME EXERCISE PROGRAM: ?Access Code: [email or web address] ? ? PT Short Term Goals - 04/04/21 1231   ? ?  ? PT SHORT TERM GOAL #1  ? Title Pt will be independent with  daily walking program, completing >/= 3x/weekly (ALL STGs Due: 05/06/21)   ? Baseline current HEP established, no walking program   ? Time 4   ? Period Weeks   ? Status New   ? Target Date 05/06/21   ?  ? PT SHORT TERM GOAL #2  ? Title DGI TBA and STG/LTG to be assessed   ? Baseline TBA   ? Time 4   ? Period Weeks   ? Status Revised   ?  ? PT SHORT TERM GOAL #3  ? Title --   ? Baseline --   ? Status --   ?  ? PT SHORT TERM GOAL #4  ? Title --   ? Baseline --   ? Status --   ? ?  ?  ? ?  ? ? ? PT Long Term Goals - 04/04/21 1235   ? ?  ? PT LONG TERM GOAL #1  ? Title Pt will be IND with final HEP and daily walking program for improved functional mobility and dec fall risk.  (ALL LTGS DUE 06/03/21)   ? Baseline no HEP established; reports independence with HEP, will benefit from progressive HEP   ? Time 8   ? Period Weeks   ? Status Achieved   ? Target Date 06/03/21   ?  ? PT LONG TERM GOAL #2  ? Title Pt will improve gait speed to >/=3.40 ft/sec without AD and B AFOs as needed in order to indicate safe community ambulation.   ? Baseline 2.35 ft/sec; 3.26 ft/sec   ? Time 8   ? Period Weeks   ? Status Revised   ?  ? PT LONG TERM GOAL #3  ? Title Pt will improve 5TSS to </=10 secs with single UE support only in order to indicate improved functional strength.   ? Baseline 22.62 secs; 12.12 secs   ? Time 8   ? Period Weeks   ? Status Revised   ?  ? PT LONG TERM GOAL #4  ? Title Pt will be able to ascend/descend curb without use of AD and Mod I for improved community mobility   ? Baseline CGA with curb negotiation, intermittent UE support   ? Time 8   ? Period Weeks   ? Status New   ?  ? PT LONG TERM GOAL #5  ? Title LTG to be set for DGI as applicable   ? Baseline TBA   ? Time 8   ? Period Weeks   ? Status New   ? ?  ?  ? ?  ? ? ? Plan - 04/19/21 1405   ? ? Clinical Impression Statement Trialed gait on the treadmill today patient tolerating well. Able to progress from 1.3 mph to 2.0 mph, but did reuqire UE support for  increased gait speed. Continued BLE strengthening and high level balance. Will continue per POC.   ? Personal Factors and Comorbidities Comorbidity 2   ? Comorbidities Chronic inflammatory demyelinating polyneuropathy, Uncontrolled DM Type 1, ADHD   ? Examination-Activity Limitations Dressing;Transfers;Stairs;Stand;Locomotion Level;Bend;Lift   ? Examination-Participation Restrictions Community Activity;Cleaning;Laundry;Meal Prep   ?  Stability/Clinical Decision Making Evolving/Moderate complexity   ? Rehab Potential Good   ? PT Frequency 1x / week   ? PT Duration 8 weeks   2x/week for 1 week, followed by 1x/week for 8 weeks  ? PT Treatment/Interventions ADLs/Self Care Home Management;Aquatic Therapy;Cryotherapy;Moist Heat;DME Instruction;Gait training;Stair training;Functional mobility training;Therapeutic activities;Therapeutic exercise;Balance training;Neuromuscular re-education;Patient/family education;Orthotic Fit/Training;Manual techniques;Passive range of motion   ? PT Next Visit Plan Continue curb negotiation without UE support, especially indoors due to taller height. Continue gait without AD. Obstacle Negotiation, SLS activities. Progress HEP for balance, gait with less restrictive device, gait outdoors, balance on compliant surfaces   ? PT Home Exercise Plan [phone]   ? Consulted and Agree with Plan of Care Patient   ? ?  ?  ? ?  ? ? ?Jones Bales, PT, DPT ?04/19/2021, 2:08 PM ? ?  ? ?

## 2021-04-25 ENCOUNTER — Ambulatory Visit: Payer: Medicaid Other | Admitting: Occupational Therapy

## 2021-04-25 ENCOUNTER — Ambulatory Visit: Payer: Medicaid Other

## 2021-04-27 ENCOUNTER — Ambulatory Visit: Payer: Medicaid Other | Admitting: Occupational Therapy

## 2021-04-27 ENCOUNTER — Ambulatory Visit: Payer: Medicaid Other | Admitting: Physical Therapy

## 2021-04-27 ENCOUNTER — Encounter: Payer: Self-pay | Admitting: Occupational Therapy

## 2021-04-27 ENCOUNTER — Other Ambulatory Visit: Payer: Self-pay

## 2021-04-27 ENCOUNTER — Encounter: Payer: Self-pay | Admitting: Physical Therapy

## 2021-04-27 DIAGNOSIS — R2681 Unsteadiness on feet: Secondary | ICD-10-CM | POA: Diagnosis not present

## 2021-04-27 DIAGNOSIS — M6281 Muscle weakness (generalized): Secondary | ICD-10-CM

## 2021-04-27 DIAGNOSIS — R208 Other disturbances of skin sensation: Secondary | ICD-10-CM

## 2021-04-27 DIAGNOSIS — R278 Other lack of coordination: Secondary | ICD-10-CM

## 2021-04-27 DIAGNOSIS — M25531 Pain in right wrist: Secondary | ICD-10-CM

## 2021-04-27 DIAGNOSIS — R29818 Other symptoms and signs involving the nervous system: Secondary | ICD-10-CM

## 2021-04-27 NOTE — Therapy (Signed)
[facility] ?[facility] ?[address] ?[place], [place], [address] ?Phone: [phone]   Fax:  [phone] ? ?Physical Therapy Treatment ? ?Patient Details  ?Name: Lucas Cunningham ?MRN: [id number] ?Date of Birth: 10-Nov-2000 ?Referring Provider (PT): Naomie Dean, MD ? ? ?Encounter Date: 04/27/2021 ? ? PT End of Session - 04/27/21 1107   ? ? Visit Number 11   ? Number of Visits 17   ? Date for PT Re-Evaluation 06/03/21   ? Authorization Type [employer] MCD   ? Authorization Time Period 12 visits 03/02/21-04/13/21; awaiting new authorization   ? Authorization - Visit Number 10   ? Authorization - Number of Visits 12   ? PT Start Time 1104   ? PT Stop Time 1143   ? PT Time Calculation (min) 39 min   ? Equipment Utilized During Treatment Gait belt   ? Activity Tolerance Patient tolerated treatment well;No increased pain   ? Behavior During Therapy [facility] for tasks assessed/performed   ? ?  ?  ? ?  ? ? ?Past Medical History:  ?Diagnosis Date  ? CIDP (chronic inflammatory demyelinating polyneuropathy) (HCC)   ? Diabetes mellitus without complication (HCC)   ? type 1  ? ? ?Past Surgical History:  ?Procedure Laterality Date  ? NO PAST SURGERIES    ? ? ?There were no vitals filed for this visit. ? ? Subjective Assessment - 04/27/21 1106   ? ? Subjective No new complaints. No falls or pain to report.   ? Pertinent History Chronic inflammatory demyelinating polyneuropathy, Uncontrolled DM Type 1, ADHD   ? Limitations Standing;Walking;House hold activities   ? Diagnostic tests MRI c-spine and L-spine unremarkable   ? Patient Stated Goals Improve balance with Standing; Get Stronger.   ? Currently in Pain? No/denies   ? ?  ?  ? ?  ? ? ? ? ? [facility] PT Assessment - 04/27/21 1108   ? ?  ? Standardized Balance Assessment  ? Standardized Balance Assessment Dynamic Gait Index   ?  ? Dynamic Gait Index  ? Level Surface Mild Impairment   ? Change in Gait Speed Mild Impairment   ? Gait with Horizontal  Head Turns Mild Impairment   ? Gait with Vertical Head Turns Mild Impairment   ? Gait and Pivot Turn Normal   ? Step Over Obstacle Mild Impairment   ? Step Around Obstacles Normal   ? Steps Mild Impairment   ? Total Score 18   ? DGI comment: 18/24. <19 predictive of fall risk   ? ?  ?  ? ?  ? ? ? ? ? ? ? ? [facility] Adult PT Treatment/Exercise - 04/27/21 1108   ? ?  ? Transfers  ? Transfers Sit to Stand;Stand to Sit   ? Sit to Stand 5: Supervision   ? Stand to Sit 5: Supervision   ?  ? Ambulation/Gait  ? Ambulation/Gait Yes   ? Ambulation/Gait Assistance 5: Supervision;4: Min guard   ? Ambulation Distance (Feet) --   around clinic with session  ? Assistive device None;Rolling walker   ? Gait Pattern Step-through pattern;Decreased stride length;Decreased dorsiflexion - right;Decreased dorsiflexion - left;Wide base of support   ? Ambulation Surface Level;Indoor   ? ?  ?  ? ?  ? ? ? ? ? ? Balance Exercises - 04/27/21 1118   ? ?  ? Balance Exercises: Standing  ? SLS with Vectors Foam/compliant surface;Intermittent upper extremity assist;Other reps (comment);Limitations   ? SLS with  Vectors Limitations on airex with occasional touch to bars, min assist at times for balace: alternating foot taps to cones on floor- forward, then lateral for 10 reps each/each side. min guard to min assist for balance.   ? Rockerboard Anterior/posterior;Lateral;EC;Head turns;30 seconds;Other reps (comment);Limitations;Intermittent UE support   ? Rockerboard Limitations both ways on balance board with occasional touch to bars: holding the board steady for EC 30 second's x 3 reps, progressing to EC head movements left<>right, up<>down for ~10 reps each. min guard to min assist for balance.   ? Sit to Stand Elevated surface;Foam/compliant surface;Limitations   ? Sit to Stand Limitations seated with feet across red foam beam: sit<>stands with light to no UE support for 2 sets of 10 reps. min guard to min assist for balance with intial standing.   ? ?   ?  ? ?  ? ? ? ? PT Short Term Goals - 04/27/21 2022   ? ?  ? PT SHORT TERM GOAL #1  ? Title Pt will be independent with daily walking program, completing >/= 3x/weekly (ALL STGs Due: 05/06/21)   ? Baseline current HEP established, no walking program   ? Time 4   ? Period Weeks   ? Status On-going   ? Target Date 05/06/21   ?  ? PT SHORT TERM GOAL #2  ? Title DGI TBA and STG/LTG to be assessed   ? Baseline 04/27/21: 18/24 scored as baseline today, PT to set goals   ? Status Achieved   ? ?  ?  ? ?  ?  ? ? ? ? ? ? ? ? PT Long Term Goals - 04/04/21 1235   ? ?  ? PT LONG TERM GOAL #1  ? Title Pt will be IND with final HEP and daily walking program for improved functional mobility and dec fall risk.  (ALL LTGS DUE 06/03/21)   ? Baseline no HEP established; reports independence with HEP, will benefit from progressive HEP   ? Time 8   ? Period Weeks   ? Status Achieved   ? Target Date 06/03/21   ?  ? PT LONG TERM GOAL #2  ? Title Pt will improve gait speed to >/=3.40 ft/sec without AD and B AFOs as needed in order to indicate safe community ambulation.   ? Baseline 2.35 ft/sec; 3.26 ft/sec   ? Time 8   ? Period Weeks   ? Status Revised   ?  ? PT LONG TERM GOAL #3  ? Title Pt will improve 5TSS to </=10 secs with single UE support only in order to indicate improved functional strength.   ? Baseline 22.62 secs; 12.12 secs   ? Time 8   ? Period Weeks   ? Status Revised   ?  ? PT LONG TERM GOAL #4  ? Title Pt will be able to ascend/descend curb without use of AD and Mod I for improved community mobility   ? Baseline CGA with curb negotiation, intermittent UE support   ? Time 8   ? Period Weeks   ? Status New   ?  ? PT LONG TERM GOAL #5  ? Title LTG to be set for DGI as applicable   ? Baseline TBA   ? Time 8   ? Period Weeks   ? Status New   ? ?  ?  ? ?  ? ? ? 04/27/21 1107  ?Plan  ?Clinical Impression Statement Today's skilled session intially focused on establishment  of baseline score for DGI with pt scoring 18/24. PT to set  goals. Remainder of session continued to focus on balance training on compliant surfaces with and without vision removed. Up to min assist needed at times. The pt is making progress and should benefit from continued PT to progress toward unmet goals.  ?Personal Factors and Comorbidities Comorbidity 2  ?Comorbidities Chronic inflammatory demyelinating polyneuropathy, Uncontrolled DM Type 1, ADHD  ?Examination-Activity Limitations Dressing;Transfers;Stairs;Stand;Locomotion Level;Bend;Lift  ?Examination-Participation Restrictions Community Activity;Cleaning;Laundry;Meal Prep  ?Pt will benefit from skilled therapeutic intervention in order to improve on the following deficits Abnormal gait;Decreased balance;Difficulty walking;Impaired sensation;Decreased strength;Decreased activity tolerance;Impaired UE functional use;Decreased endurance;Decreased knowledge of precautions;Decreased knowledge of use of DME;Impaired perceived functional ability  ?Stability/Clinical Decision Making Evolving/Moderate complexity  ?Rehab Potential Good  ?PT Frequency 1x / week  ?PT Duration 8 weeks ?(2x/week for 1 week, followed by 1x/week for 8 weeks)  ?PT Treatment/Interventions ADLs/Self Care Home Management;Aquatic Therapy;Cryotherapy;Moist Heat;DME Instruction;Gait training;Stair training;Functional mobility training;Therapeutic activities;Therapeutic exercise;Balance training;Neuromuscular re-education;Patient/family education;Orthotic Fit/Training;Manual techniques;Passive range of motion  ?PT Next Visit Plan Continue curb negotiation without UE support, especially indoors due to taller height. Continue gait without AD. Obstacle Negotiation, SLS activities. Progress HEP for balance, gait with less restrictive device, gait outdoors, balance on compliant surfaces  ?PT Home Exercise Plan [phone]  ?Consulted and Agree with Plan of Care Patient  ? ? ? ? ? ? ? ? ? ?Patient will benefit from skilled therapeutic intervention in order to  improve the following deficits and impairments:  Abnormal gait, Decreased balance, Difficulty walking, Impaired sensation, Decreased strength, Decreased activity tolerance, Impaired UE functional use, Decrease

## 2021-04-27 NOTE — Therapy (Signed)
[facility] ?[address] ?[place], [place], [address] ?Phone: [phone]   Fax:  [phone] ? ?Occupational Therapy Treatment ? ?Patient Details  ?Name: Lucas Cunningham ?MRN: [id number] ?Date of Birth: 2000/04/03 ?Referring Provider (OT): Lenord Carbo ? ? ?Encounter Date: 04/27/2021 ? ? OT End of Session - 04/27/21 1148   ? ? Visit Number 11   ? Number of Visits 17   ? Date for OT Re-Evaluation 05/16/21   ? Authorization Type Per front desk, pt is now [employer]. 03/21/21   ? Authorization Time Period Medicaid - 6 visits approved 03-02-21 - 05/13/21 , submitted for 11 additional visits 03/21/2021   ? Authorization - Visit Number 11   ? Authorization - Number of Visits 17   ? OT Start Time 1146   ? OT Stop Time 1230   ? OT Time Calculation (min) 44 min   ? Activity Tolerance Patient tolerated treatment well   ? Behavior During Therapy [facility] for tasks assessed/performed   ? ?  ?  ? ?  ? ? ?Past Medical History:  ?Diagnosis Date  ? CIDP (chronic inflammatory demyelinating polyneuropathy) (HCC)   ? Diabetes mellitus without complication (HCC)   ? type 1  ? ? ?Past Surgical History:  ?Procedure Laterality Date  ? NO PAST SURGERIES    ? ? ?There were no vitals filed for this visit. ? ? Subjective Assessment - 04/27/21 1147   ? ? Subjective  Pt reports no changes and no pain.   ? Currently in Pain? No/denies   ? ?  ?  ? ?  ? ? ? ? ?ADLs reports still challenge with typing, handwriting, and game controllers for video games. Pt reports now able to open chip bags, button up shirts, manipulate zippers and complete zippers independently etc. ? ?Small Pegboard completing and copying pattern with RUE with min difficulty and drops while holding dice in RUE to encourage pincer grasp. Pt removed with in hand manipulation with RUE with moderate drops.  Copied pattern with 100% Accuracy ? ?Grip Strength and using force with dino ball popper with RUE. Pt stood and used  LUE for stabilizing toy and squeezing with RUE with moderate force.  ? ?Typing game Word Tris with easy words with hunt and peck method and moderate difficulty and speed.  ? ? ? ? ? ? ? ? ? ? ? ? ? ? ? ? ? ? ? ? OT Short Term Goals - 04/19/21 1359   ? ?  ? OT SHORT TERM GOAL #1  ? Title Patient will demonstrate ability to put on and take off his socks with mod assist   ? Baseline Dependent   ? Time 4   ? Period Weeks   ? Status Achieved   ? Target Date 04/10/21   ?  ? OT SHORT TERM GOAL #2  ? Title Patient will demonstrate ability to pull up zipper once zipper closed, or Pull down zipper on jacket with min assist   ? Baseline Dependent   ? Time 4   ? Period Weeks   ? Status Achieved   ?  ? OT SHORT TERM GOAL #3  ? Title Patient will demonstrate ability to legibly print a sentence with dominant right hand and built up grip   ? Baseline Able to print his name 75% legibility   ? Time 4   ? Period Weeks   ? Status Achieved   ?  ? OT SHORT TERM  GOAL #4  ? Title Patient will demonstrate ability to type on computer, or navigate controls for gaming with set up and increased time   ? Baseline Unable to type, use computer consistently   ? Time 4   ? Period Weeks   ? Status Achieved   ? ?  ?  ? ?  ? ? ? ? OT Long Term Goals - 04/27/21 1150   ? ?  ? OT LONG TERM GOAL #1  ? Title Patient will don/doff socks with modified independence   ? Baseline dependent   ? Time 8   ? Period Weeks   ? Status Achieved   ?  ? OT LONG TERM GOAL #2  ? Title .Patient will perform simple cooking on stovetop with supervision and intermittent assistance   ? Baseline Is not completing  - dependent on grandmother   ? Time 8   ? Period Weeks   ? Status Achieved   ?  ? OT LONG TERM GOAL #3  ? Title Patient will improve grip strength to allow him to open a chip bag with his hands   ? Baseline Uses his teeth to tear open packages   ? Time 8   ? Period Weeks   ? Status Achieved   ?  ? OT LONG TERM GOAL #4  ? Title Patient will demonstrate improved fine  motor control such that he can complete the 9 hole peg test in less than 2 min with RUE. REVISED: RUE complete in 70 seconds or less.   ? Baseline RUE - Just under 1 min for 3 pegs   ? Time 8   ? Period Weeks   ? Status On-going   ?  ? OT LONG TERM GOAL #5  ? Title Patient will demonstrate 75% active wrist extension against gravity in LUE to help with grip strength   ? Baseline Cannot extend wrist against gravity   ? Time 8   ? Period Weeks   ? Status Achieved   ?  ? OT LONG TERM GOAL #6  ? Title Patient will demobnstrate a 5 lb increase in grip strength in BUE. REVISE - 10 lbs total, 5 additional lbs BUE   ? Baseline R 26, L 4, 03/21/21 R 31.9, L 9.7   ? Time 8   ? Period Weeks   ? Status Achieved   ?  ? OT LONG TERM GOAL #7  ? Title Pt will be compliant and verbalize understanding of wear and care instructions for any splints and/or orthoses PRN   ? Baseline Patient's custom splint was damaged and is no longer useable   ? Time 8   ? Period Weeks   ? Status On-going   ? ?  ?  ? ?  ? ? ? ? ? ? ? ? Plan - 04/27/21 1151   ? ? Clinical Impression Statement Pt pleased with progress thus far.  Continuing to show steady functional improvement   ? OT Occupational Profile and History Detailed Assessment- Review of Records and additional review of physical, cognitive, psychosocial history related to current functional performance   ? Occupational performance deficits (Please refer to evaluation for details): ADL's;IADL's   ? Body Structure / Function / Physical Skills ADL;Decreased knowledge of use of DME;Strength;Balance;Dexterity;GMC;Tone;UE functional use;Body mechanics;Endurance;IADL;ROM;Sensation;Mobility;Flexibility;Coordination;Decreased knowledge of precautions;FMC   ? Rehab Potential Good   ? Clinical Decision Making Several treatment options, min-mod task modification necessary   ? Comorbidities Affecting Occupational Performance: May have comorbidities impacting  occupational performance   ? Modification or  Assistance to Complete Evaluation  No modification of tasks or assist necessary to complete eval   ? OT Frequency 2x / week   ? OT Duration 8 weeks   ? OT Treatment/Interventions Self-care/ADL training;Aquatic Therapy;DME and/or AE instruction;Splinting;Balance training;Therapeutic activities;Therapeutic exercise;Passive range of motion;Functional Mobility Training;Neuromuscular education;Electrical Stimulation;Manual Therapy;Patient/family education   ? Plan folding laundry, BUE coordination, strengthening, LUE wrist extension   ? Consulted and Agree with Plan of Care Patient   ? ?  ?  ? ?  ? ? ?Patient will benefit from skilled therapeutic intervention in order to improve the following deficits and impairments:   ?Body Structure / Function / Physical Skills: ADL, Decreased knowledge of use of DME, Strength, Balance, Dexterity, GMC, Tone, UE functional use, Body mechanics, Endurance, IADL, ROM, Sensation, Mobility, Flexibility, Coordination, Decreased knowledge of precautions, FMC ?  ?  ? ? ?Visit Diagnosis: ?Other lack of coordination ? ?Muscle weakness (generalized) ? ?Pain in right wrist ? ?Other disturbances of skin sensation ? ?Other symptoms and signs involving the nervous system ? ?Unsteadiness on feet ? ? ? ?Problem List ?Patient Active Problem List  ? Diagnosis Date Noted  ? Proteinuria 11/23/2020  ? CIDP (chronic inflammatory demyelinating polyneuropathy) (HCC) 05/25/2020  ? Noncompliance with medication treatment due to difficulty with dosing 05/25/2020  ? Mixed hyperlipidemia 05/25/2020  ? Depression 05/25/2020  ? Diabetic polyneuropathy associated with type 1 diabetes mellitus (HCC) 02/26/2019  ? Postprandial bloating 02/25/2019  ? Sensory problems with limbs 02/25/2019  ? Uncontrolled type 1 diabetes mellitus with hyperglycemia (HCC) 05/24/2017  ? Morbid obesity (HCC) 05/24/2017  ? ? ?Junious DresserKirstyn M Analys Ryden, OT ?04/27/2021, 12:55 PM ? ?[facility] ?[address] ?[place], [place], [address] ?Phone: [phone]   Fax:  [phone] ? ?Name: Lucas Cunningham ?MRN: [id number] ?Date of Birth: May 11, 2000 ? ?

## 2021-04-29 ENCOUNTER — Other Ambulatory Visit: Payer: Self-pay

## 2021-04-29 ENCOUNTER — Other Ambulatory Visit: Payer: Self-pay | Admitting: *Deleted

## 2021-04-29 NOTE — Patient Instructions (Signed)
Visit Information ? ?Mr. Hamberger was given information about Medicaid Managed Care team care coordination services as a part of their Healthy [facility] Medicaid benefit. Cadyn Fann verbally consented to engagement with the [facility] Managed Care team.  ? ?If you are experiencing a medical emergency, please call 911 or report to your local emergency department or urgent care.  ? ?If you have a non-emergency medical problem during routine business hours, please contact your provider's office and ask to speak with a nurse.  ? ?For questions related to your Healthy [facility] health plan, please call: [phone] or visit the homepage here: [email or web address] ? ?If you would like to schedule transportation through your Healthy [facility] plan, please call the following number at least 2 days in advance of your appointment: [phone] ? For information about your ride after you set it up, call Ride Assist at [phone]. Use this number to activate a Will Call pickup, or if your transportation is late for a scheduled pickup. Use this number, too, if you need to make a change or cancel a previously scheduled reservation. ? If you need transportation services right away, call [phone]. The after-hours call center is staffed 24 hours to handle ride assistance and urgent reservation requests (including discharges) 365 days a year. Urgent trips include sick visits, hospital discharge requests and life-sustaining treatment. ? ?Call the [facility] Line at [phone], at any time, 24 hours a day, 7 days a week. If you are in danger or need immediate medical attention call 911. ? ?If you would like help to quit smoking, call 1-800-QUIT-NOW (650 098 5844) OR Espa?ol: 1-855-D?jelo-Ya 936-070-6206) o para m?s informaci?n haga clic aqu? or Text READY to 200-400 to register via text ? ?Mr. Derks, ? ? ?Please see education materials related to diabetes provided as  print materials.  ? ?The patient verbalized understanding of instructions, educational materials, and care plan provided today and agreed to receive a mailed copy of patient instructions, educational materials, and care plan.  ? ?Telephone follow up appointment with Managed Medicaid care management team member scheduled for:06/01/21 @ 3:30pm ? ?[name] RN, BSN ?[place]  [facility] ?RN Care Coordinator ? ? ?Following is a copy of your plan of care:  ?Care Plan : RN Care Manager Plan of Care  ?Updates made by Heidi Dach, RN since 04/29/2021 12:00 AM  ?  ? ?Problem: Knowledge Deficits and Care Coordination related to managing DM   ?Priority: High  ?  ? ?Long-Range Goal: Development of plan of care to address needs and knowledge deficits related to [name]   ?Start Date: 01/03/2021  ?Expected End Date: 06/10/2021  ?Priority: High  ?Note:   ?Current Barriers:  ?Chronic Disease Management support and education needs related to [name]  Chaysen reports not wearing CGM, due to broken touch screen receiver. He is checking BS manually, blood sugars often around 300. He is attending PT and OT and feels improvement with his strength and mobility. Patient needs to reschedule missed Endocrinology and Eye Exam.  ? ?RNCM Clinical Goal(s):  ?Patient will verbalize understanding of plan for management of [name] as evidenced by patient's verbalization of and self management activities ?take all medications exactly as prescribed and will call provider for medication related questions as evidenced by documentation in EMR and improving lab values    ?attend all scheduled medical appointments: schedule follow up with PCP and attend future PT/OT appointments  as evidenced by physician documentation in EMR        ?  demonstrate improved adherence to prescribed treatment plan for [name] as evidenced by taking all medications as directed, eating a diabetic diet and attending appointments with PCP and specialist  through collaboration  with RN Care manager, provider, and care team.  ? ?Interventions: ?Inter-disciplinary care team collaboration (see longitudinal plan of care) ?Evaluation of current treatment plan related to  self management and patient's adherence to plan as established by provider ? ? ?Diabetes:  (Status: Goal on Track (progressing): YES.) Long Term Goal  ? ?Lab Results  ?Component Value Date  ? HGBA1C 11.6 (A) 03/31/2021  ?Assessed patient's understanding of A1c goal: <7% ?Reviewed medications with patient and discussed importance of medication adherence;        ?Reviewed prescribed diet with patient encouraged to increase lean protein, fruits and vegetables; ?Discussed plans with patient for ongoing care management follow up and provided patient with direct contact information for care management team;      ?Provided patient with written educational materials related to hypo and hyperglycemia and importance of correct treatment;       ?Reviewed scheduled/upcoming provider appointments including:   schedule follow up with PCP, multiple PT/OT appointments and rescheduling Endocrinology, and Eye Exam;         ?call provider for findings outside established parameters;       ?Review of patient status, including review of consultants reports, relevant laboratory and other test results, and medications completed;       ?Provided information to [facility], [address] [place] [place], [place] [address], [phone] ?Encouraged patient to work on increasing his active time, try to walk around the house for 10 minutes after each meal ?Congratulated patient on A1C improvement ?Contacted [employer] to inquire about obtaining new touch screen receiver, [employer] unable to replace this part and recommend requesting new device from supplier ?Collaborated with PCP for patient needing new Dexcom device ? ? ?Patient Goals/Self-Care Activities: ?Take medications as prescribed   ?Attend all scheduled provider appointments ?Call pharmacy for  medication refills 3-7 days in advance of running out of medications ?Call provider office for new concerns or questions  ?schedule appointment with eye doctor ?take the blood sugar meter to all doctor visits ?drink 6 to 8 glasses of water each day ?fill half of plate with vegetables ?keep a food diary ?manage portion size ? ? ?  ? ?  ?

## 2021-04-29 NOTE — Patient Outreach (Signed)
?Medicaid Managed Care   ?Nurse Care Manager Note ? ?04/29/2021 ?Name:  Lucas Cunningham MRN:  [id number] DOB:  2001-01-28 ? ?Lucas Lemmingsazman Montee is an 21 y.o. year old male who is a primary patient of Amponsah, Flossie BuffyProsper M, MD.  [facility] was consulted for assistance with:    ?[name] ?CIDP ? ?Lucas Cunningham was given information about Medicaid Managed Care Coordination team services today. Lucas Cunningham Patient agreed to services and verbal consent obtained. ? ?Engaged with patient by telephone for follow up visit in response to provider referral for case management and/or care coordination services.  ? ?Assessments/Interventions:  Review of past medical history, allergies, medications, health status, including review of consultants reports, laboratory and other test data, was performed as part of comprehensive evaluation and provision of chronic care management services. ? ?SDOH (Social Determinants of Health) assessments and interventions performed: ?SDOH Interventions   ? ?Flowsheet Row Most Recent Value  ?SDOH Interventions   ?Housing Interventions Intervention Not Indicated  ?Transportation Interventions Intervention Not Indicated  ? ?  ? ? ?Care Plan ? ?No Known Allergies ? ?Medications Reviewed Today   ? ? Reviewed by Heidi Dachobb, Khameron Gruenwald A, RN (Registered Nurse) on 04/29/21 at 1551  Med List Status: <None>  ? ?Medication Order Taking? Sig Documenting Provider Last Dose Status Informant  ?atorvastatin (LIPITOR) 10 MG tablet [id number] Yes TAKE ONE TABLET BY MOUTH ONCE DAILY Steffanie RainwaterAmponsah, Prosper M, MD Taking Active   ?Continuous Blood Gluc Receiver (DEXCOM G6 RECEIVER) DEVI [id number] No 1 Device by Does not apply route as directed.  ?Patient not taking: Reported on 04/29/2021  ? Steffanie RainwaterAmponsah, Prosper M, MD Not Taking Active   ?Continuous Blood Gluc Sensor (DEXCOM G6 SENSOR) MISC [id number] No Inject 1 applicator into the skin as directed. (change sensor every 10 days)  ?Patient not taking: Reported on 04/29/2021   ? Steffanie RainwaterAmponsah, Prosper M, MD Not Taking Active   ?Continuous Blood Gluc Transmit (DEXCOM G6 TRANSMITTER) MISC [id number] No Inject 1 Device into the skin as directed. (re-use up to 8x with each new sensor)  ?Patient not taking: Reported on 04/29/2021  ? Steffanie RainwaterAmponsah, Prosper M, MD Not Taking Active   ?Dulaglutide (TRULICITY) 1.5 MG/0.5ML SOPN [id number] Yes Inject 1.5 mg into the skin once a week. Steffanie RainwaterAmponsah, Prosper M, MD Taking Active   ?gabapentin (NEURONTIN) 300 MG capsule [id number] No Take 300 mg by mouth 2 (two) times daily.  ?Patient not taking: Reported on 04/29/2021  ? [provider] Not Taking Active   ?GAMUNEX-C 20 GM/200ML SOLN [id number] Yes  [provider] Taking Active   ?GAMUNEX-C 5 GM/50ML SOLN [id number] Yes  [provider] Taking Active   ?insulin glargine (LANTUS) 100 UNIT/ML Solostar Pen [id number] Yes Inject 30 Units into the skin daily. Steffanie RainwaterAmponsah, Prosper M, MD Taking Active   ?insulin lispro (HUMALOG KWIKPEN) 200 UNIT/ML KwikPen [id number] Yes Inject 30 units three times daily with meals Amponsah, Flossie BuffyProsper M, MD Taking Active   ?Insulin Pen Needle (BD PEN NEEDLE NANO U/F) 32G X 4 MM MISC [id number] Yes Inject up to 8 times per day. Steffanie RainwaterAmponsah, Prosper M, MD Taking Active   ?PRESCRIPTION MEDICATION [id number] Yes Inject into the vein. IVIG every 4 weeks. Order for new start sent to [facility] on 04/08/2020. Anson FretAhern, Antonia B, MD Taking Active   ? ?  ?  ? ?  ? ? ?Patient Active Problem List  ? Diagnosis Date Noted  ? Proteinuria 11/23/2020  ? CIDP (chronic inflammatory demyelinating polyneuropathy) (HCC) 05/25/2020  ?  Noncompliance with medication treatment due to difficulty with dosing 05/25/2020  ? Mixed hyperlipidemia 05/25/2020  ? Depression 05/25/2020  ? Diabetic polyneuropathy associated with type 1 diabetes mellitus (HCC) 02/26/2019  ? Postprandial bloating 02/25/2019  ? Sensory problems with limbs 02/25/2019  ? Uncontrolled type 1 diabetes mellitus with hyperglycemia  (HCC) 05/24/2017  ? Morbid obesity (HCC) 05/24/2017  ? ? ?Conditions to be addressed/monitored per PCP order:   [name] and CDIP ? ?Care Plan : RN Care Manager Plan of Care  ?Updates made by Heidi Dach, RN since 04/29/2021 12:00 AM  ?  ? ?Problem: Knowledge Deficits and Care Coordination related to managing DM   ?Priority: High  ?  ? ?Long-Range Goal: Development of plan of care to address needs and knowledge deficits related to [name]   ?Start Date: 01/03/2021  ?Expected End Date: 06/10/2021  ?Priority: High  ?Note:   ?Current Barriers:  ?Chronic Disease Management support and education needs related to [name]  Lucas Cunningham reports not wearing CGM, due to broken touch screen receiver. He is checking BS manually, blood sugars often around 300. He is attending PT and OT and feels improvement with his strength and mobility. Patient needs to reschedule missed Endocrinology and Eye Exam.  ? ?RNCM Clinical Goal(s):  ?Patient will verbalize understanding of plan for management of [name] as evidenced by patient's verbalization of and self management activities ?take all medications exactly as prescribed and will call provider for medication related questions as evidenced by documentation in EMR and improving lab values    ?attend all scheduled medical appointments: schedule follow up with PCP and attend future PT/OT appointments  as evidenced by physician documentation in EMR        ?demonstrate improved adherence to prescribed treatment plan for [name] as evidenced by taking all medications as directed, eating a diabetic diet and attending appointments with PCP and specialist  through collaboration with RN Care manager, provider, and care team.  ? ?Interventions: ?Inter-disciplinary care team collaboration (see longitudinal plan of care) ?Evaluation of current treatment plan related to  self management and patient's adherence to plan as established by provider ? ? ?Diabetes:  (Status: Goal on Track (progressing): YES.) Long Term Goal   ? ?Lab Results  ?Component Value Date  ? HGBA1C 11.6 (A) 03/31/2021  ?Assessed patient's understanding of A1c goal: <7% ?Reviewed medications with patient and discussed importance of medication adherence;        ?Reviewed prescribed diet with patient encouraged to increase lean protein, fruits and vegetables; ?Discussed plans with patient for ongoing care management follow up and provided patient with direct contact information for care management team;      ?Provided patient with written educational materials related to hypo and hyperglycemia and importance of correct treatment;       ?Reviewed scheduled/upcoming provider appointments including:   schedule follow up with PCP, multiple PT/OT appointments and rescheduling Endocrinology, and Eye Exam;         ?call provider for findings outside established parameters;       ?Review of patient status, including review of consultants reports, relevant laboratory and other test results, and medications completed;       ?Provided information to [facility], [address] [place] [place] [place], [place] [address], [phone] ?Encouraged patient to work on increasing his active time, try to walk around the house for 10 minutes after each meal ?Congratulated patient on A1C improvement ?Contacted [employer] to inquire about obtaining new touch screen receiver, [employer] unable to replace this  part and recommend requesting new device from supplier ?Collaborated with PCP for patient needing new Dexcom device ? ? ?Patient Goals/Self-Care Activities: ?Take medications as prescribed   ?Attend all scheduled provider appointments ?Call pharmacy for medication refills 3-7 days in advance of running out of medications ?Call provider office for new concerns or questions  ?schedule appointment with eye doctor ?take the blood sugar meter to all doctor visits ?drink 6 to 8 glasses of water each day ?fill half of plate with vegetables ?keep a food diary ?manage portion size ? ? ?   ? ? ?Follow Up:  Patient agrees to Care Plan and Follow-up. ? ?Plan: The Managed Medicaid care management team will reach out to the patient again over the next 30 days. ? ?Date/time of next scheduled RN care managem

## 2021-05-02 ENCOUNTER — Other Ambulatory Visit: Payer: Self-pay | Admitting: Student

## 2021-05-02 DIAGNOSIS — E1042 Type 1 diabetes mellitus with diabetic polyneuropathy: Secondary | ICD-10-CM

## 2021-05-02 DIAGNOSIS — E1065 Type 1 diabetes mellitus with hyperglycemia: Secondary | ICD-10-CM

## 2021-05-02 MED ORDER — DEXCOM G6 RECEIVER DEVI: 1.0000 | 2 refills | Status: AC

## 2021-05-02 NOTE — Progress Notes (Signed)
Per patient's Medicaid Case Manager, his [employer] Touchscreen receiver was accidentally stepped on and broken. [employer] will not replace it and requested a provider to prescribe another one. Resending another Receiver to his pharmacy.  ?

## 2021-05-03 ENCOUNTER — Encounter: Payer: Self-pay | Admitting: Occupational Therapy

## 2021-05-03 ENCOUNTER — Ambulatory Visit: Payer: Medicaid Other

## 2021-05-03 ENCOUNTER — Other Ambulatory Visit: Payer: Self-pay

## 2021-05-03 ENCOUNTER — Ambulatory Visit: Payer: Medicaid Other | Admitting: Occupational Therapy

## 2021-05-03 DIAGNOSIS — R278 Other lack of coordination: Secondary | ICD-10-CM

## 2021-05-03 DIAGNOSIS — R2681 Unsteadiness on feet: Secondary | ICD-10-CM

## 2021-05-03 DIAGNOSIS — R2689 Other abnormalities of gait and mobility: Secondary | ICD-10-CM

## 2021-05-03 DIAGNOSIS — M6281 Muscle weakness (generalized): Secondary | ICD-10-CM

## 2021-05-03 DIAGNOSIS — M25531 Pain in right wrist: Secondary | ICD-10-CM

## 2021-05-03 DIAGNOSIS — R208 Other disturbances of skin sensation: Secondary | ICD-10-CM

## 2021-05-03 DIAGNOSIS — R29818 Other symptoms and signs involving the nervous system: Secondary | ICD-10-CM

## 2021-05-03 NOTE — Therapy (Signed)
?OUTPATIENT PHYSICAL THERAPY TREATMENT NOTE ? ? ?Patient Name: Lucas Cunningham ?MRN: [id number] ?DOB:[birth year], 21 y.o., male ?Today's Date: 05/03/2021 ? ?PCP: Steffanie RainwaterAmponsah, Prosper M, MD ?REFERRING PROVIDER: Earl LagosNarendra, Nischal, MD ? ? PT End of Session - 05/03/21 1329   ? ? Visit Number 12   ? Number of Visits 17   ? Date for PT Re-Evaluation 06/03/21   ? Authorization Type [employer] [facility]   ? Authorization Time Period 12 visits 03/02/21-04/13/21; awaiting new authorization   ? Authorization - Visit Number 11   ? Authorization - Number of Visits 12   ? PT Start Time 1328   finishing up with OT  ? PT Stop Time 1358   ? PT Time Calculation (min) 30 min   ? Equipment Utilized During Treatment Gait belt   ? Activity Tolerance Patient tolerated treatment well;No increased pain   ? [facility] assessed/performed   ? ?  ?  ? ?  ? ? ?Past Medical History:  ?Diagnosis Date  ? CIDP (chronic inflammatory demyelinating polyneuropathy) (HCC)   ? Diabetes mellitus without complication (HCC)   ? type 1  ? ?Past Surgical History:  ?Procedure Laterality Date  ? NO PAST SURGERIES    ? ?Patient Active Problem List  ? Diagnosis Date Noted  ? Proteinuria 11/23/2020  ? CIDP (chronic inflammatory demyelinating polyneuropathy) (HCC) 05/25/2020  ? Noncompliance with medication treatment due to difficulty with dosing 05/25/2020  ? Mixed hyperlipidemia 05/25/2020  ? Depression 05/25/2020  ? Diabetic polyneuropathy associated with type 1 diabetes mellitus (HCC) 02/26/2019  ? Postprandial bloating 02/25/2019  ? Sensory problems with limbs 02/25/2019  ? Uncontrolled type 1 diabetes mellitus with hyperglycemia (HCC) 05/24/2017  ? Morbid obesity (HCC) 05/24/2017  ? ? ?REFERRING DIAG: G61.81 (ICD-10-CM) - CIDP (chronic inflammatory demyelinating polyneuropathy) (HCC) ? ?THERAPY DIAG:  ?Muscle weakness (generalized) ? ?Unsteadiness on feet ? ?Other abnormalities of gait and mobility ? ?PERTINENT HISTORY: Chronic inflammatory  demyelinating polyneuropathy, Uncontrolled DM Type 1, ADHD  ? ?PRECAUTIONS: Fall ? ?SUBJECTIVE: Forgot my walker today. No new changes/complaints. No pain or falls to report ? ?PAIN:  ?Are you having pain? No ? ? ?TODAY'S TREATMENT:  ?There Ex:  ?Completed gait on treadmill as warmup for improved endurance/activity tolerance. Started at 1.8 mph with UE support and completed x 3 minutes. Progressed to 2.0 mph and completed additional 3 minutes. Seated rest break required after completion.  ? ?NMR:  ?Crossover Gait: at countertop completed x  2 laps of crossover gait working toward light UE support to no UE support  ?Obstacle Negotiation: with use of orange hurdles completed ambulation without AD and stepping over hurdles along with step over cone with toe tap prior to promote SLS, completed x 2 laps. Cues for slowed paced to control balance.   ? ?Standing on Dense Blue Foam:  ? Completed EO with ball toss x 15 reps to self, then progressed to PT tossing ball in various directions x 15 reps. Intermittent touch A.  ? Then EC with bil stance, completed 3 x 15-20 seconds. Intermittent UE support as needed.  ? Completed anterior/posterior stepping strategy on/off dense foam, completed x 10 reps bilaterally, without UE support. CGA as needed. Cues for improved step posterior.  ? ?PATIENT EDUCATION: ?Education details: Continue HEP; Plan to ambulate into session without RW ?Person educated: Patient ?Education method: Explanation ?Education comprehension: verbalized understanding ? ? ?HOME EXERCISE PROGRAM: ?Access Code: [email or web address] ? ? PT Short Term Goals - 04/04/21 1231   ? ?  ?  PT SHORT TERM GOAL #1  ? Title Pt will be independent with daily walking program, completing >/= 3x/weekly (ALL STGs Due: 05/06/21)   ? Baseline current HEP established, no walking program   ? Time 4   ? Period Weeks   ? Status New   ? Target Date 05/06/21   ?  ? PT SHORT TERM GOAL #2  ? Title DGI TBA and STG/LTG to be assessed   ? Baseline TBA   ?  Time 4   ? Period Weeks   ? Status Revised   ?  ? PT SHORT TERM GOAL #3  ? Title --   ? Baseline --   ? Status --   ?  ? PT SHORT TERM GOAL #4  ? Title --   ? Baseline --   ? Status --   ? ?  ?  ? ?  ? ? ? PT Long Term Goals - 04/04/21 1235   ? ?  ? PT LONG TERM GOAL #1  ? Title Pt will be IND with final HEP and daily walking program for improved functional mobility and dec fall risk.  (ALL LTGS DUE 06/03/21)   ? Baseline no HEP established; reports independence with HEP, will benefit from progressive HEP   ? Time 8   ? Period Weeks   ? Status Achieved   ? Target Date 06/03/21   ?  ? PT LONG TERM GOAL #2  ? Title Pt will improve gait speed to >/=3.40 ft/sec without AD and B AFOs as needed in order to indicate safe community ambulation.   ? Baseline 2.35 ft/sec; 3.26 ft/sec   ? Time 8   ? Period Weeks   ? Status Revised   ?  ? PT LONG TERM GOAL #3  ? Title Pt will improve 5TSS to </=10 secs with single UE support only in order to indicate improved functional strength.   ? Baseline 22.62 secs; 12.12 secs   ? Time 8   ? Period Weeks   ? Status Revised   ?  ? PT LONG TERM GOAL #4  ? Title Pt will be able to ascend/descend curb without use of AD and Mod I for improved community mobility   ? Baseline CGA with curb negotiation, intermittent UE support   ? Time 8   ? Period Weeks   ? Status New   ?  ? PT LONG TERM GOAL #5  ? Title LTG to be set for DGI as applicable   ? Baseline TBA   ? Time 8   ? Period Weeks   ? Status New   ? ?  ?  ? ?  ? ? ? Plan - 04/19/21 1405   ? ? Clinical Impression Statement Continued gait training on treadmill, progressing speed and time to improve activity tolerance with slow reduction of UE support. Continued balance training focused on continued SLS, obstacle negotiation and vision removed activities. PT educating to trial ambulating wihtout RW more frequently in safe manner.   ? Personal Factors and Comorbidities Comorbidity 2   ? Comorbidities Chronic inflammatory demyelinating  polyneuropathy, Uncontrolled DM Type 1, ADHD   ? Examination-Activity Limitations Dressing;Transfers;Stairs;Stand;Locomotion Level;Bend;Lift   ? Examination-Participation Restrictions Community Activity;Cleaning;Laundry;Meal Prep   ? Stability/Clinical Decision Making Evolving/Moderate complexity   ? Rehab Potential Good   ? PT Frequency 1x / week   ? PT Duration 8 weeks   2x/week for 1 week, followed by 1x/week for 8 weeks  ? PT Treatment/Interventions  ADLs/Self Care Home Management;Aquatic Therapy;Cryotherapy;Moist Heat;DME Instruction;Gait training;Stair training;Functional mobility training;Therapeutic activities;Therapeutic exercise;Balance training;Neuromuscular re-education;Patient/family education;Orthotic Fit/Training;Manual techniques;Passive range of motion   ? PT Next Visit Plan Continue curb negotiation without UE support, especially indoors due to taller height. Continue gait without AD. Obstacle Negotiation, SLS activities. Progress HEP for balance, gait with less restrictive device, gait outdoors, balance on compliant surfaces   ? PT Home Exercise Plan [phone]   ? Consulted and Agree with Plan of Care Patient   ? ?  ?  ? ?  ? ? ?Tempie Donning, PT, DPT ?05/03/2021, 1:57 PM ? ?  ? ?

## 2021-05-03 NOTE — Therapy (Signed)
[facility] ?[address] ?[place], [place], [address] ?Phone: [phone]   Fax:  [phone] ? ?Occupational Therapy Treatment ? ?Patient Details  ?Name: Lucas Cunningham ?MRN: [id number] ?Date of Birth: 16-Dec-2000 ?Referring Provider (OT): Lenord Carbo ? ? ?Encounter Date: 05/03/2021 ? ? OT End of Session - 05/03/21 1338   ? ? Visit Number 12   ? Number of Visits 17   ? Date for OT Re-Evaluation 05/16/21   ? Authorization Type Per front desk, pt is now [employer]. 03/21/21   ? Authorization Time Period Medicaid - 6 visits approved 03-02-21 - 05/13/21 , submitted for 11 additional visits 03/21/2021   ? Authorization - Visit Number 12   ? Authorization - Number of Visits 17   ? Progress Note Due on Visit 16   ? OT Start Time 1251   ? OT Stop Time 1330   ? OT Time Calculation (min) 39 min   ? Activity Tolerance Patient tolerated treatment well   ? Behavior During Therapy [facility] for tasks assessed/performed   ? ?  ?  ? ?  ? ? ?Past Medical History:  ?Diagnosis Date  ? CIDP (chronic inflammatory demyelinating polyneuropathy) (HCC)   ? Diabetes mellitus without complication (HCC)   ? type 1  ? ? ?Past Surgical History:  ?Procedure Laterality Date  ? NO PAST SURGERIES    ? ? ?There were no vitals filed for this visit. ? ? Subjective Assessment - 05/03/21 1332   ? ? Subjective  Patient reports improved ability to open chip bags   ? Patient is accompanied by: Family member   younger brother  ? Currently in Pain? No/denies   ? ?  ?  ? ?  ? ? ? ? ? ? ? ? ? ? ? ? ? ? ? OT Treatments/Exercises (OP) - 05/03/21 0001   ? ?  ? ADLs  ? Home Maintenance Worked on folding laundry- standing using BUE.  Patient able to fold, zipper/unzipper, hang cothing on hangers, etc.  Patient needed increased time ad encouragement - tended to stop activity when he ran into an obstacle.   ? Financial Management Patient reports difficulty counting out change, reaching money from wallet,  and separating $ bills.   ? Leisure Patient reports Oval 8 splint on thumb has been helpful in allowing him to use game controller.   ?  ? Hand Exercises  ? Other Hand Exercises Tossing and catching juggling balls.  Toss, clap, catch while ambulating with larger lightweight ball.   ? Other Hand Exercises Hand gripper on 3rd setting for RUE, and on first setting for LUE.   ? ?  ?  ? ?  ? ? ? ? ? ? ? ? ? ? ? OT Short Term Goals - 05/03/21 1347   ? ?  ? OT SHORT TERM GOAL #1  ? Title Patient will demonstrate ability to put on and take off his socks with mod assist   ? Baseline Dependent   ? Time 4   ? Period Weeks   ? Status Achieved   ? Target Date 04/10/21   ?  ? OT SHORT TERM GOAL #2  ? Title Patient will demonstrate ability to pull up zipper once zipper closed, or Pull down zipper on jacket with min assist   ? Baseline Dependent   ? Time 4   ? Period Weeks   ? Status Achieved   ?  ? OT SHORT TERM GOAL #  3  ? Title Patient will demonstrate ability to legibly print a sentence with dominant right hand and built up grip   ? Baseline Able to print his name 75% legibility   ? Time 4   ? Period Weeks   ? Status Achieved   ?  ? OT SHORT TERM GOAL #4  ? Title Patient will demonstrate ability to type on computer, or navigate controls for gaming with set up and increased time   ? Baseline Unable to type, use computer consistently   ? Time 4   ? Period Weeks   ? Status Achieved   ? ?  ?  ? ?  ? ? ? ? OT Long Term Goals - 05/03/21 1347   ? ?  ? OT LONG TERM GOAL #1  ? Title Patient will don/doff socks with modified independence   ? Baseline dependent   ? Time 8   ? Period Weeks   ? Status Achieved   ?  ? OT LONG TERM GOAL #2  ? Title .Patient will perform simple cooking on stovetop with supervision and intermittent assistance   ? Baseline Is not completing  - dependent on grandmother   ? Time 8   ? Period Weeks   ? Status Achieved   ?  ? OT LONG TERM GOAL #3  ? Title Patient will improve grip strength to allow him to open a chip  bag with his hands   ? Baseline Uses his teeth to tear open packages   ? Time 8   ? Period Weeks   ? Status Achieved   ?  ? OT LONG TERM GOAL #4  ? Title Patient will demonstrate improved fine motor control such that he can complete the 9 hole peg test in less than 2 min with RUE. REVISED: RUE complete in 70 seconds or less.   ? Baseline RUE - Just under 1 min for 3 pegs   ? Time 8   ? Period Weeks   ? Status On-going   ?  ? OT LONG TERM GOAL #5  ? Title Patient will demonstrate 75% active wrist extension against gravity in LUE to help with grip strength   ? Baseline Cannot extend wrist against gravity   ? Time 8   ? Period Weeks   ? Status Achieved   ?  ? OT LONG TERM GOAL #6  ? Title Patient will demobnstrate a 5 lb increase in grip strength in BUE. REVISE - 10 lbs total, 5 additional lbs BUE   ? Baseline R 26, L 4, 03/21/21 R 31.9, L 9.7   ? Time 8   ? Period Weeks   ? Status Achieved   ?  ? OT LONG TERM GOAL #7  ? Title Pt will be compliant and verbalize understanding of wear and care instructions for any splints and/or orthoses PRN   ? Baseline Patient's custom splint was damaged and is no longer useable   ? Time 8   ? Period Weeks   ? Status On-going   ? ?  ?  ? ?  ? ? ? ? ? ? ? ? Plan - 05/03/21 1340   ? ? Clinical Impression Statement Patient walked into therapy without his walker by mistake.  Patient has not been using walker consistently for several weeks. Patient wearing BAFO's.  Pt continues to lack full extension in digits, and has less strength in LUE compared to RUE.  Patient continues to show improvements functionally.   ?  OT Occupational Profile and History Detailed Assessment- Review of Records and additional review of physical, cognitive, psychosocial history related to current functional performance   ? Occupational performance deficits (Please refer to evaluation for details): ADL's;IADL's   ? Body Structure / Function / Physical Skills ADL;Decreased knowledge of use of  DME;Strength;Balance;Dexterity;GMC;Tone;UE functional use;Body mechanics;Endurance;IADL;ROM;Sensation;Mobility;Flexibility;Coordination;Decreased knowledge of precautions;FMC   ? Rehab Potential Good   ? Clinical Decision Making Several treatment options, min-mod task modification necessary   ? Comorbidities Affecting Occupational Performance: May have comorbidities impacting occupational performance   ? Modification or Assistance to Complete Evaluation  No modification of tasks or assist necessary to complete eval   ? OT Frequency 2x / week   ? OT Duration 8 weeks   ? OT Treatment/Interventions Self-care/ADL training;Aquatic Therapy;DME and/or AE instruction;Splinting;Balance training;Therapeutic activities;Therapeutic exercise;Passive range of motion;Functional Mobility Training;Neuromuscular education;Electrical Stimulation;Manual Therapy;Patient/family education   ? Plan Consider estin for digit extension, BUE coordination, strengthening, LUE wrist extension   ? Consulted and Agree with Plan of Care Patient   ? ?  ?  ? ?  ? ? ?Patient will benefit from skilled therapeutic intervention in order to improve the following deficits and impairments:   ?Body Structure / Function / Physical Skills: ADL, Decreased knowledge of use of DME, Strength, Balance, Dexterity, GMC, Tone, UE functional use, Body mechanics, Endurance, IADL, ROM, Sensation, Mobility, Flexibility, Coordination, Decreased knowledge of precautions, FMC ?  ?  ? ? ?Visit Diagnosis: ?Muscle weakness (generalized) ? ?Unsteadiness on feet ? ?Other lack of coordination ? ?Pain in right wrist ? ?Other disturbances of skin sensation ? ?Other symptoms and signs involving the nervous system ? ? ? ?Problem List ?Patient Active Problem List  ? Diagnosis Date Noted  ? Proteinuria 11/23/2020  ? CIDP (chronic inflammatory demyelinating polyneuropathy) (HCC) 05/25/2020  ? Noncompliance with medication treatment due to difficulty with dosing 05/25/2020  ? Mixed  hyperlipidemia 05/25/2020  ? Depression 05/25/2020  ? Diabetic polyneuropathy associated with type 1 diabetes mellitus (HCC) 02/26/2019  ? Postprandial bloating 02/25/2019  ? Sensory problems with limbs 02/25/2019  ? Uncontrolled type

## 2021-05-09 ENCOUNTER — Ambulatory Visit: Payer: Medicaid Other

## 2021-05-09 ENCOUNTER — Ambulatory Visit: Payer: Medicaid Other | Admitting: Occupational Therapy

## 2021-05-17 ENCOUNTER — Ambulatory Visit: Payer: Medicaid Other | Admitting: Occupational Therapy

## 2021-05-17 ENCOUNTER — Ambulatory Visit: Payer: Medicaid Other | Attending: Internal Medicine

## 2021-05-17 DIAGNOSIS — M25531 Pain in right wrist: Secondary | ICD-10-CM | POA: Insufficient documentation

## 2021-05-17 DIAGNOSIS — M6281 Muscle weakness (generalized): Secondary | ICD-10-CM | POA: Insufficient documentation

## 2021-05-17 DIAGNOSIS — R2681 Unsteadiness on feet: Secondary | ICD-10-CM | POA: Insufficient documentation

## 2021-05-17 DIAGNOSIS — R2689 Other abnormalities of gait and mobility: Secondary | ICD-10-CM | POA: Insufficient documentation

## 2021-05-17 DIAGNOSIS — R208 Other disturbances of skin sensation: Secondary | ICD-10-CM | POA: Insufficient documentation

## 2021-05-17 DIAGNOSIS — R278 Other lack of coordination: Secondary | ICD-10-CM | POA: Insufficient documentation

## 2021-05-23 ENCOUNTER — Ambulatory Visit: Payer: Medicaid Other

## 2021-05-23 ENCOUNTER — Encounter: Payer: Self-pay | Admitting: Occupational Therapy

## 2021-05-23 ENCOUNTER — Ambulatory Visit: Payer: Medicaid Other | Admitting: Occupational Therapy

## 2021-05-23 DIAGNOSIS — R2689 Other abnormalities of gait and mobility: Secondary | ICD-10-CM

## 2021-05-23 DIAGNOSIS — R2681 Unsteadiness on feet: Secondary | ICD-10-CM

## 2021-05-23 DIAGNOSIS — M6281 Muscle weakness (generalized): Secondary | ICD-10-CM

## 2021-05-23 DIAGNOSIS — R278 Other lack of coordination: Secondary | ICD-10-CM

## 2021-05-23 DIAGNOSIS — R208 Other disturbances of skin sensation: Secondary | ICD-10-CM

## 2021-05-23 DIAGNOSIS — M25531 Pain in right wrist: Secondary | ICD-10-CM | POA: Diagnosis present

## 2021-05-23 NOTE — Therapy (Signed)
?OUTPATIENT PHYSICAL THERAPY TREATMENT NOTE/DISCHARGE SUMMARY ? ? ?Patient Name: Lucas Cunningham ?MRN: [id number] ?DOB:04-05-2000, 21 y.o., male ?Today's Date: 05/23/2021 ? ?PCP: Lacinda Axon, MD ?REFERRING PROVIDER: Melvenia Beam, MD ? ?PHYSICAL THERAPY DISCHARGE SUMMARY ? ?Visits from Start of Care: 13 ? ?Current functional level related to goals / functional outcomes: ?See Clinical Impression Statement ?  ?Remaining deficits: ?None ?  ?Education / Equipment: ?HEP Provided; Walking Program  ? ?Patient agrees to discharge. Patient goals were met. Patient is being discharged due to meeting the stated rehab goals. ? ? ? ? PT End of Session - 05/23/21 0953   ? ? Visit Number 13   ? Number of Visits 17   ? Date for PT Re-Evaluation 06/03/21   ? Authorization Type [employer] [facility]   ? Authorization Time Period 12 visits 03/02/21-04/13/21; [employer] Medicaid has approved 8 PT visits 04/19/21 - 06/19/21   ? Authorization - Visit Number 12   ? Authorization - Number of Visits 20   ? PT Start Time 1002   ? Equipment Utilized During Treatment Gait belt   ? Activity Tolerance Patient tolerated treatment well;No increased pain   ? Behavior During Therapy [facility] for tasks assessed/performed   ? ?  ?  ? ?  ? ? ?Past Medical History:  ?Diagnosis Date  ? CIDP (chronic inflammatory demyelinating polyneuropathy) (HCC)   ? Diabetes mellitus without complication ([place])   ? type 1  ? ?Past Surgical History:  ?Procedure Laterality Date  ? NO PAST SURGERIES    ? ?Patient Active Problem List  ? Diagnosis Date Noted  ? Proteinuria 11/23/2020  ? CIDP (chronic inflammatory demyelinating polyneuropathy) ([place]) 05/25/2020  ? Noncompliance with medication treatment due to difficulty with dosing 05/25/2020  ? Mixed hyperlipidemia 05/25/2020  ? Depression 05/25/2020  ? Diabetic polyneuropathy associated with type 1 diabetes mellitus ([place]) 02/26/2019  ? Postprandial bloating 02/25/2019  ? Sensory problems with limbs 02/25/2019  ? Uncontrolled type 1  diabetes mellitus with hyperglycemia ([place]) 05/24/2017  ? Morbid obesity ([place]) 05/24/2017  ? ? ?REFERRING DIAG: G61.81 (ICD-10-CM) - CIDP (chronic inflammatory demyelinating polyneuropathy) (HCC) ? ?THERAPY DIAG:  ?Muscle weakness (generalized) ? ?Unsteadiness on feet ? ?Other abnormalities of gait and mobility ? ?PERTINENT HISTORY: Chronic inflammatory demyelinating polyneuropathy, Uncontrolled DM Type 1, ADHD  ? ?PRECAUTIONS: Fall ? ?SUBJECTIVE: Patient reports has been walking without the RW, is not using it unless the legs are bothering him. No falls. No other new changes/complaints. ? ?PAIN:  ?Are you having pain? No ? ?TODAY'S TREATMENT:  ?There Ex:  ?Completed warmup on SciFit on Level 5.0 x 6 minutes for improved endurance/activity tolerance.Patient tolerating increased resistance well. ? ?CURB:  ?Level of Assistance: Complete Independence ?Assistive device utilized: None ?Curb Comments: ascend/descend curb x 3 reps without UE support, no use of AD and Independence. No imbalance noted with curb negotiation. ? ?GAIT: ?Gait pattern: step through pattern, decreased step length- Right, and decreased step length- Left ?Distance walked: clinic distance, throughout therapy session without use of AD and bil AFOs donned. ?Assistive device utilized: None ?Level of assistance: Complete Independence ?Comments: bil AFOs donned,  ?Gait Speed: 9.02 secs without use of AD = 3.63 ft/sec ? ?5x sit <> stand = 10.25 seconds without UE support. No imbalance noted.  ? ?Completed entire review of HEP, PT continued education on compliance upon d/c: ?Access Code: [email or web address] ?URL: [email or web address] ?Date: 05/23/2021 ?Prepared by: Baldomero Lamy ? ?Exercises ?- Sit to Stand with Armchair  - 2 x daily - 7  x weekly - 2 sets - 10 reps ?- Walking [place]  - 1 x daily - 7 x weekly - 1 sets - 4 reps ?- Sidestepping in [place] with Resistance and Arms Forward  - 1 x daily - 7 x weekly - 1 sets - 4 reps ? ? ?PATIENT  EDUCATION: ?Education details: D/C; continue HEP ?Person educated: Patient ?Education method: Explanation ?Education comprehension: verbalized understanding ? ? ?HOME EXERCISE PROGRAM: ?Access Code: [email or web address] ? ? PT Short Term Goals - 04/04/21 1231   ? ?  ? PT SHORT TERM GOAL #1  ? Title Pt will be independent with daily walking program, completing >/= 3x/weekly (ALL STGs Due: 05/06/21)   ? Baseline current HEP established, no walking program   ? Time 4   ? Period Weeks   ? Status New   ? Target Date 05/06/21   ?  ? PT SHORT TERM GOAL #2  ? Title DGI TBA and STG/LTG to be assessed   ? Baseline TBA   ? Time 4   ? Period Weeks   ? Status Revised   ?  ? PT SHORT TERM GOAL #3  ? Title --   ? Baseline --   ? Status --   ?  ? PT SHORT TERM GOAL #4  ? Title --   ? Baseline --   ? Status --   ? ?  ?  ? ?  ? ? ? PT Long Term Goals - 04/04/21 1235   ? ?  ? PT LONG TERM GOAL #1  ? Title Pt will be IND with final HEP and daily walking program for improved functional mobility and dec fall risk.  (ALL LTGS DUE 06/03/21)   ? Baseline no HEP established; reports independence with HEP, will benefit from progressive HEP; Independent with final HEP  ? Time 8   ? Period Weeks   ? Status Achieved   ? Target Date 06/03/21   ?  ? PT LONG TERM GOAL #2  ? Title Pt will improve gait speed to >/=3.40 ft/sec without AD and B AFOs as needed in order to indicate safe community ambulation.   ? Baseline 2.35 ft/sec; 3.26 ft/sec; 3.63 ft/sec  ? Time 8   ? Period Weeks   ? Status Achieved   ?  ? PT LONG TERM GOAL #3  ? Title Pt will improve 5TSS to </=10 secs with single UE support only in order to indicate improved functional strength.   ? Baseline 22.62 secs; 12.12 secs; 10.25 seconds  ? Time 8   ? Period Weeks   ? Status Achieved  ?  ? PT LONG TERM GOAL #4  ? Title Pt will be able to ascend/descend curb without use of AD and Mod I for improved community mobility   ? Baseline CGA with curb negotiation, intermittent UE support; no AD and Independent  with curb negotiation  ? Time 8   ? Period Weeks   ? Status Achieved  ?  ? PT LONG TERM GOAL #5  ? Title LTG to be set for DGI as applicable   ? Baseline TBA; Deferred due to LTG not set  ? Time 8   ? Period Weeks   ? Status Deferred  ? ?  ?  ? ?  ? ? ? Plan - 04/19/21 1405   ? ? Clinical Impression Statement Completed assessment of patient's progress toward LTGs. Patient able to meet all LTGs at this time. Patient has improved 5x sit <>  stand to 10.25 seconds demo low fall risk. Patient is now able to ambulate without use of AD and ambulating at 3.63 ft/sec and able to negotiate curb without UE support. Patient has made significant progress with PT services and demo readiness to d/c from PT services at this time. Patient agreeable.   ? Personal Factors and Comorbidities Comorbidity 2   ? Comorbidities Chronic inflammatory demyelinating polyneuropathy, Uncontrolled DM Type 1, ADHD   ? Examination-Activity Limitations Dressing;Transfers;Stairs;Stand;Locomotion Level;Bend;Lift   ? Examination-Participation Restrictions Community Activity;Cleaning;Laundry;Meal Prep   ? Stability/Clinical Decision Making Evolving/Moderate complexity   ? Rehab Potential Good   ? PT Frequency 1x / week   ? PT Duration 8 weeks   2x/week for 1 week, followed by 1x/week for 8 weeks  ? PT Treatment/Interventions ADLs/Self Care Home Management;Aquatic Therapy;Cryotherapy;Moist Heat;DME Instruction;Gait training;Stair training;Functional mobility training;Therapeutic activities;Therapeutic exercise;Balance training;Neuromuscular re-education;Patient/family education;Orthotic Fit/Training;Manual techniques;Passive range of motion   ? PT Next Visit Plan D/C visit   ? PT Home Exercise Plan [phone]   ? Consulted and Agree with Plan of Care Patient   ? ?  ?  ? ?  ? ? ?Jones Bales, PT, DPT ?05/23/2021, 10:28 AM ? ?  ? ?

## 2021-05-23 NOTE — Therapy (Signed)
[place] ?[place] ?[place], [place], [address] ?Phone: [phone]   Fax:  [phone] ? ?Occupational Therapy Treatment & Discharge ? ?Patient Details  ?Name: Lucas Cunningham ?MRN: [id number] ?Date of Birth: [birth year] ?Referring Provider (OT): Delfin Edis ? ? ?Encounter Date: 05/23/2021 ? ? OT End of Session - 05/23/21 1104   ? ? Visit Number 13   ? Number of Visits 17   ? Date for OT Re-Evaluation 05/16/21   ? Authorization Type Per front desk, pt is now [employer]. 03/21/21   ? Authorization Time Period Medicaid - 6 visits approved 03-02-21 - 05/13/21 , submitted for 11 additional visits 03/21/2021   ? Authorization - Visit Number 13   ? Authorization - Number of Visits 17   ? Progress Note Due on Visit 16   ? OT Start Time 1100   ? OT Stop Time 1130   ? OT Time Calculation (min) 30 min   ? Activity Tolerance Patient tolerated treatment well   ? Behavior During Therapy [facility] for tasks assessed/performed   ? ?  ?  ? ?  ? ? ?OCCUPATIONAL THERAPY DISCHARGE SUMMARY ? ?Visits from Start of Care: 13 ? ?Current functional level related to goals / functional outcomes: ?Pt has made gains with increased independence with ADLs and IADLs and increased coordination and grip strength. ?  ?Remaining deficits: ?Pt continues with decreased coordination and strength in BUE ?  ?Education / Equipment: ?HEP, adapted nailclippers  ? ?Patient agrees to discharge. Patient goals were met. Patient is being discharged due to meeting the stated rehab goals.. ? ? ? ? ?Past Medical History:  ?Diagnosis Date  ? CIDP (chronic inflammatory demyelinating polyneuropathy) (HCC)   ? Diabetes mellitus without complication ([place])   ? type 1  ? ? ?Past Surgical History:  ?Procedure Laterality Date  ? NO PAST SURGERIES    ? ? ?There were no vitals filed for this visit. ? ? ? ? ? ? ? ? ? ? ? ? ? ? ? ? ? ? ? ? ? ? ? ? ? ? OT Short Term Goals - 05/03/21 1347   ? ?  ? OT SHORT TERM GOAL  #1  ? Title Patient will demonstrate ability to put on and take off his socks with mod assist   ? Baseline Dependent   ? Time 4   ? Period Weeks   ? Status Achieved   ? Target Date 04/10/21   ?  ? OT SHORT TERM GOAL #2  ? Title Patient will demonstrate ability to pull up zipper once zipper closed, or Pull down zipper on jacket with min assist   ? Baseline Dependent   ? Time 4   ? Period Weeks   ? Status Achieved   ?  ? OT SHORT TERM GOAL #3  ? Title Patient will demonstrate ability to legibly print a sentence with dominant right hand and built up grip   ? Baseline Able to print his name 75% legibility   ? Time 4   ? Period Weeks   ? Status Achieved   ?  ? OT SHORT TERM GOAL #4  ? Title Patient will demonstrate ability to type on computer, or navigate controls for gaming with set up and increased time   ? Baseline Unable to type, use computer consistently   ? Time 4   ? Period Weeks   ? Status Achieved   ? ?  ?  ? ?  ? ? ? ?  OT Long Term Goals - 05/23/21 1118   ? ?  ? OT LONG TERM GOAL #1  ? Title Patient will don/doff socks with modified independence   ? Baseline dependent   ? Time 8   ? Period Weeks   ? Status Achieved   ?  ? OT LONG TERM GOAL #2  ? Title .Patient will perform simple cooking on stovetop with supervision and intermittent assistance   ? Baseline Is not completing  - dependent on grandmother   ? Time 8   ? Period Weeks   ? Status Achieved   ?  ? OT LONG TERM GOAL #3  ? Title Patient will improve grip strength to allow him to open a chip bag with his hands   ? Baseline Uses his teeth to tear open packages   ? Time 8   ? Period Weeks   ? Status Achieved   ?  ? OT LONG TERM GOAL #4  ? Title Patient will demonstrate improved fine motor control such that he can complete the 9 hole peg test in less than 2 min with RUE. REVISED: RUE complete in 70 seconds or less.   ? Baseline RUE - Just under 1 min for 3 pegs   ? Time 8   ? Period Weeks   ? Status Achieved   69.5s with RUE  ?  ? OT LONG TERM GOAL #5  ?  Title Patient will demonstrate 75% active wrist extension against gravity in LUE to help with grip strength   ? Baseline Cannot extend wrist against gravity   ? Time 8   ? Period Weeks   ? Status Achieved   ?  ? OT LONG TERM GOAL #6  ? Title Patient will demobnstrate a 5 lb increase in grip strength in BUE. REVISE - 10 lbs total, 5 additional lbs BUE   ? Baseline R 26, L 4, 03/21/21 R 31.9, L 9.7   ? Time 8   ? Period Weeks   ? Status Achieved   ?  ? OT LONG TERM GOAL #7  ? Title Pt will be compliant and verbalize understanding of wear and care instructions for any splints and/or orthoses PRN   ? Baseline Patient's custom splint was damaged and is no longer useable   ? Time 8   ? Period Weeks   ? Status Achieved   ? ?  ?  ? ?  ? ? ? ? ? ? ? ? Plan - 05/23/21 1236   ? ? Clinical Impression Statement Pt has met all goals and is ready for discharge at this time.   ? OT Occupational Profile and History Detailed Assessment- Review of Records and additional review of physical, cognitive, psychosocial history related to current functional performance   ? Occupational performance deficits (Please refer to evaluation for details): ADL's;IADL's   ? Body Structure / Function / Physical Skills ADL;Decreased knowledge of use of DME;Strength;Balance;Dexterity;GMC;Tone;UE functional use;Body mechanics;Endurance;IADL;ROM;Sensation;Mobility;Flexibility;Coordination;Decreased knowledge of precautions;FMC   ? Rehab Potential Good   ? Clinical Decision Making Several treatment options, min-mod task modification necessary   ? Comorbidities Affecting Occupational Performance: May have comorbidities impacting occupational performance   ? Modification or Assistance to Complete Evaluation  No modification of tasks or assist necessary to complete eval   ? OT Frequency 2x / week   ? OT Duration 8 weeks   ? OT Treatment/Interventions Self-care/ADL training;Aquatic Therapy;DME and/or AE instruction;Splinting;Balance training;Therapeutic  activities;Therapeutic exercise;Passive range of motion;Functional Mobility Training;Neuromuscular education;Electrical Stimulation;Manual  Therapy;Patient/family education   ? Plan d/c OT   ? Consulted and Agree with Plan of Care Patient   ? ?  ?  ? ?  ? ? ?Patient will benefit from skilled therapeutic intervention in order to improve the following deficits and impairments:   ?Body Structure / Function / Physical Skills: ADL, Decreased knowledge of use of DME, Strength, Balance, Dexterity, GMC, Tone, UE functional use, Body mechanics, Endurance, IADL, ROM, Sensation, Mobility, Flexibility, Coordination, Decreased knowledge of precautions, FMC ?  ?  ? ? ?Visit Diagnosis: ?Muscle weakness (generalized) ? ?Other abnormalities of gait and mobility ? ?Other lack of coordination ? ?Pain in right wrist ? ?Other disturbances of skin sensation ? ? ? ?Problem List ?Patient Active Problem List  ? Diagnosis Date Noted  ? Proteinuria 11/23/2020  ? CIDP (chronic inflammatory demyelinating polyneuropathy) ([place]) 05/25/2020  ? Noncompliance with medication treatment due to difficulty with dosing 05/25/2020  ? Mixed hyperlipidemia 05/25/2020  ? Depression 05/25/2020  ? 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 Postprandial bloating 02/25/2019  ? Sensory problems with limbs 02/25/2019  ? Uncontrolled type 1 diabetes mellitus with hyperglycemia ([place]) 05/24/2017  ? Morbid obesity ([place]) 05/24/2017  ? ? ?Zachery Conch, OT ?05/23/2021, 12:37 PM ? ?[place] ?[place] ?[place], [place], [address] ?Phone: [phone]   Fax:  [phone] ? ?Name: Lucas Cunningham ?MRN: [id number] ?Date of Birth: 11-09-2000 ? ?

## 2021-05-31 ENCOUNTER — Encounter: Payer: Medicaid Other | Admitting: Occupational Therapy

## 2021-05-31 ENCOUNTER — Ambulatory Visit: Payer: Medicaid Other

## 2021-06-01 ENCOUNTER — Other Ambulatory Visit: Payer: Self-pay | Admitting: *Deleted

## 2021-06-01 NOTE — Patient Instructions (Signed)
Visit Information ? ?Mr. Cumba was given information about Medicaid Managed Care team care coordination services as a part of their Healthy [facility] Medicaid benefit. Adolf Roselle verbally consented to engagement with the [facility] Managed Care team.  ? ?If you are experiencing a medical emergency, please call 911 or report to your local emergency department or urgent care.  ? ?If you have a non-emergency medical problem during routine business hours, please contact your provider's office and ask to speak with a nurse.  ? ?For questions related to your Healthy [facility] health plan, please call: [phone] or visit the homepage here: [email or web address] ? ?If you would like to schedule transportation through your Healthy [facility] plan, please call the following number at least 2 days in advance of your appointment: [phone] ? For information about your ride after you set it up, call Ride Assist at [phone]. Use this number to activate a Will Call pickup, or if your transportation is late for a scheduled pickup. Use this number, too, if you need to make a change or cancel a previously scheduled reservation. ? If you need transportation services right away, call [phone]. The after-hours call center is staffed 24 hours to handle ride assistance and urgent reservation requests (including discharges) 365 days a year. Urgent trips include sick visits, hospital discharge requests and life-sustaining treatment. ? ?Call the [place] at [phone], at any time, 24 hours a day, 7 days a week. If you are in danger or need immediate medical attention call 911. ? ?If you would like help to quit smoking, call 1-800-QUIT-NOW (304)718-9137) OR Espa?ol: 1-855-D?jelo-Ya (972)455-4805) o para m?s informaci?n haga clic aqu? or Text READY to 200-400 to register via text ? ?Mr. Frandsen, ? ? ?Please see education materials related to diabetes provided as  print materials.  ? ?The patient verbalized understanding of instructions, educational materials, and care plan provided today and agreed to receive a mailed copy of patient instructions, educational materials, and care plan.  ? ?Telephone follow up appointment with Managed Medicaid care management team member scheduled for:07/05/21 @ 3:30pm ? ?Lurena Joiner RN, BSN ?[place] ?RN Care Coordinator ? ? ?Following is a copy of your plan of care:  ?Care Plan : [place] of Care  ?Updates made by Melissa Montane, RN since 06/01/2021 12:00 AM  ?  ? ?Problem: Knowledge Deficits and Care Coordination related to managing DM   ?Priority: High  ?  ? ?Long-Range Goal: Development of plan of care to address needs and knowledge deficits related to [name]   ?Start Date: 01/03/2021  ?Expected End Date: 07/13/2021  ?Priority: High  ?Note:   ?Current Barriers:  ?Chronic Disease Management support and education needs related to [place] reports receiving requested Dexcom, blood sugar currently 240, 10 minutes after eating a peanut butter and jelly sandwich. Patient has switched PCP, to [facility]. His last appointment was 05/26/21 and next visit scheduled for 06/03/21. This office is close to his home. [name] has completed PT/OT and continues to work on provided exercises. Patient has not rescheduled missed Endocrinology and Eye Exam.  ? ?RNCM Clinical Goal(s):  ?Patient will verbalize understanding of plan for management of [name] as evidenced by patient's verbalization of and self management activities ?take all medications exactly as prescribed and will call provider for medication related questions as evidenced by documentation in EMR and improving lab values    ?attend all scheduled medical appointments: 06/03/21 with PCP, call to reschedule  Endocrinology and Eye Exam as evidenced by physician documentation in EMR        ?demonstrate improved adherence to prescribed treatment plan for [name] as  evidenced by taking all medications as directed, eating a diabetic diet and attending appointments with PCP and specialist  through collaboration with RN Care manager, provider, and care team.  ? ?Interventions: ?Inter-disciplinary care team collaboration (see longitudinal plan of care) ?Evaluation of current treatment plan related to  self management and patient's adherence to plan as established by provider ? ? ?Diabetes:  (Status: Goal on Track (progressing): YES.) Long Term Goal  ? ?Lab Results  ?Component Value Date  ? HGBA1C 11.6 (A) 03/31/2021  ?Assessed patient's understanding of A1c goal: <7% ?Reviewed medications with patient and discussed importance of medication adherence;        ?Reviewed prescribed diet with patient encouraged to increase lean protein, fruits and vegetables; ?Discussed plans with patient for ongoing care management follow up and provided patient with direct contact information for care management team;      ?Provided patient with written educational materials related to hypo and hyperglycemia and importance of correct treatment;       ?Reviewed scheduled/upcoming provider appointments including:   06/03/21 with PCP and rescheduling Endocrinology, and Eye Exam;         ?call provider for findings outside established parameters;       ?Review of patient status, including review of consultants reports, relevant laboratory and other test results, and medications completed;       ?Provided information to [facility], [address] [place], [place] [address], [phone] ?Provided [facility] [phone] ?Encouraged patient to continue exercises provided during PT/OT ? ? ?Patient Goals/Self-Care Activities: ?Take medications as prescribed   ?Attend all scheduled provider appointments ?Call pharmacy for medication refills 3-7 days in advance of running out of medications ?Call provider office for new concerns or questions  ?schedule appointment with eye doctor ?take the  blood sugar meter to all doctor visits ?drink 6 to 8 glasses of water each day ?fill half of plate with vegetables ?keep a food diary ?manage portion size ? ? ?  ? ?  ?

## 2021-06-01 NOTE — Patient Outreach (Signed)
?Medicaid Managed Care   ?Nurse Care Manager Note ? ?06/01/2021 ?Name:  Kolsen Cunningham MRN:  [id number] DOB:  June 05, 2000 ? ?Lucas Cunningham is an 21 y.o. year old male who is a primary patient of A[place], Flossie Buffy, MD.  The [facility] Managed Care Coordination team was consulted for assistance with:    ?[name] ? ?[name] was given information about Medicaid Managed Care Coordination team services today. Lucas Cunningham Patient agreed to services and verbal consent obtained. ? ?Engaged with patient by telephone for follow up visit in response to provider referral for case management and/or care coordination services.  ? ?Assessments/Interventions:  Review of past medical history, allergies, medications, health status, including review of consultants reports, laboratory and other test data, was performed as part of comprehensive evaluation and provision of chronic care management services. ? ?SDOH (Social Determinants of Health) assessments and interventions performed: ? ? ?Care Plan ? ?No Known Allergies ? ?Medications Reviewed Today   ? ? Reviewed by Heidi Dach, RN (Registered Nurse) on 06/01/21 at 1627  Med List Status: <None>  ? ?Medication Order Taking? Sig Documenting Provider Last Dose Status Informant  ?atorvastatin (LIPITOR) 10 MG tablet [id number] Yes TAKE ONE TABLET BY MOUTH ONCE DAILY Steffanie Rainwater, MD Taking Active   ?Continuous Blood Gluc Receiver (DEXCOM G6 RECEIVER) DEVI [id number] Yes 1 Device by Does not apply route as directed. Steffanie Rainwater, MD Taking Active   ?Continuous Blood Gluc Sensor (DEXCOM G6 SENSOR) MISC [id number] Yes Inject 1 applicator into the skin as directed. (change sensor every 10 days) Amponsah, Flossie Buffy, MD Taking Active   ?Continuous Blood Gluc Transmit (DEXCOM G6 TRANSMITTER) MISC [id number] Yes Inject 1 Device into the skin as directed. (re-use up to 8x with each new sensor) Amponsah, Flossie Buffy, MD Taking Active   ?Dulaglutide (TRULICITY) 1.5 MG/0.5ML SOPN [id number] Yes  Inject 1.5 mg into the skin once a week. Steffanie Rainwater, MD Taking Active   ?gabapentin (NEURONTIN) 300 MG capsule [id number] No TAKE ONE CAPSULE BY MOUTH TWICE DAILY  ?Patient not taking: Reported on 06/01/2021  ? Steffanie Rainwater, MD Not Taking Active   ?GAMUNEX-C 20 GM/200ML SOLN [id number] Yes  [provider] Taking Active   ?GAMUNEX-C 5 GM/50ML SOLN [id number] Yes  [provider] Taking Active   ?insulin glargine (LANTUS) 100 UNIT/ML Solostar Pen [id number] Yes Inject 30 Units into the skin daily. Steffanie Rainwater, MD Taking Active   ?insulin lispro (HUMALOG KWIKPEN) 200 UNIT/ML KwikPen [id number] Yes Inject 30 units three times daily with meals Amponsah, Flossie Buffy, MD Taking Active   ?Insulin Pen Needle (BD PEN NEEDLE NANO U/F) 32G X 4 MM MISC [id number] Yes Inject up to 8 times per day. Steffanie Rainwater, MD Taking Active   ?PRESCRIPTION MEDICATION [id number] Yes Inject into the vein. IVIG every 4 weeks. Order for new start sent to [facility] on 04/08/2020. Anson Fret, MD Taking Active   ? ?  ?  ? ?  ? ? ?Patient Active Problem List  ? Diagnosis Date Noted  ? Proteinuria 11/23/2020  ? CIDP (chronic inflammatory demyelinating polyneuropathy) (HCC) 05/25/2020  ? Noncompliance with medication treatment due to difficulty with dosing 05/25/2020  ? Mixed hyperlipidemia 05/25/2020  ? Depression 05/25/2020  ? Diabetic polyneuropathy associated with type 1 diabetes mellitus (HCC) 02/26/2019  ? Postprandial bloating 02/25/2019  ? Sensory problems with limbs 02/25/2019  ? Uncontrolled type 1 diabetes mellitus with hyperglycemia (HCC) 05/24/2017  ? Morbid obesity (HCC) 05/24/2017  ? ? ?  Conditions to be addressed/monitored per PCP order:   [name] ? ?Care Plan : RN Care Manager Plan of Care  ?Updates made by Heidi Dach, RN since 06/01/2021 12:00 AM  ?  ? ?Problem: Knowledge Deficits and Care Coordination related to managing DM   ?Priority: High  ?  ? ?Long-Range Goal: Development  of plan of care to address needs and knowledge deficits related to [name]   ?Start Date: 01/03/2021  ?Expected End Date: 07/13/2021  ?Priority: High  ?Note:   ?Current Barriers:  ?Chronic Disease Management support and education needs related to [name]  Lucas Cunningham reports receiving requested Dexcom, blood sugar currently 240, 10 minutes after eating a peanut butter and jelly sandwich. Patient has switched PCP, to [facility]. His last appointment was 05/26/21 and next visit scheduled for 06/03/21. This office is close to his home. Lucas Cunningham has completed PT/OT and continues to work on provided exercises. Patient has not rescheduled missed Endocrinology and Eye Exam.  ? ?RNCM Clinical Goal(s):  ?Patient will verbalize understanding of plan for management of [name] as evidenced by patient's verbalization of and self management activities ?take all medications exactly as prescribed and will call provider for medication related questions as evidenced by documentation in EMR and improving lab values    ?attend all scheduled medical appointments: 06/03/21 with PCP, call to reschedule Endocrinology and Eye Exam as evidenced by physician documentation in EMR        ?demonstrate improved adherence to prescribed treatment plan for [name] as evidenced by taking all medications as directed, eating a diabetic diet and attending appointments with PCP and specialist  through collaboration with RN Care manager, provider, and care team.  ? ?Interventions: ?Inter-disciplinary care team collaboration (see longitudinal plan of care) ?Evaluation of current treatment plan related to  self management and patient's adherence to plan as established by provider ? ? ?Diabetes:  (Status: Goal on Track (progressing): YES.) Long Term Goal  ? ?Lab Results  ?Component Value Date  ? HGBA1C 11.6 (A) 03/31/2021  ?Assessed patient's understanding of A1c goal: <7% ?Reviewed medications with patient and discussed importance of medication adherence;        ?Reviewed prescribed  diet with patient encouraged to increase lean protein, fruits and vegetables; ?Discussed plans with patient for ongoing care management follow up and provided patient with direct contact information for care management team;      ?Provided patient with written educational materials related to hypo and hyperglycemia and importance of correct treatment;       ?Reviewed scheduled/upcoming provider appointments including:   06/03/21 with PCP and rescheduling Endocrinology, and Eye Exam;         ?call provider for findings outside established parameters;       ?Review of patient status, including review of consultants reports, relevant laboratory and other test results, and medications completed;       ?Provided information to [facility], [address] [place], [place] [address], [phone] ?Provided [facility] [phone] ?Encouraged patient to continue exercises provided during PT/OT ? ? ?Patient Goals/Self-Care Activities: ?Take medications as prescribed   ?Attend all scheduled provider appointments ?Call pharmacy for medication refills 3-7 days in advance of running out of medications ?Call provider office for new concerns or questions  ?schedule appointment with eye doctor ?take the blood sugar meter to all doctor visits ?drink 6 to 8 glasses of water each day ?fill half of plate with vegetables ?keep a food diary ?manage portion size ? ? ?  ? ? ?  Follow Up:  Patient agrees to Care Plan and Follow-up. ? ?Plan: The Managed Medicaid care management team will reach out to the patient again over the next 30 days. ? ?Date/time of next scheduled RN care management/care coordination outreach:  07/05/21 @ 3:30pm ? ?Estanislado EmmsMelanie Carnetta Losada RN, BSN ?[place]  [facility] ?RN Care Coordinator ? ?

## 2021-07-05 ENCOUNTER — Other Ambulatory Visit: Payer: Self-pay | Admitting: *Deleted

## 2021-07-05 NOTE — Patient Outreach (Signed)
Care Coordination  07/05/2021  Lucas Cunningham 09-07-2000 [id number]   Medicaid Managed Care   Unsuccessful Outreach Note  07/05/2021 Name: Lucas Cunningham MRN: [id number] DOB: 10-13-2000  Referred by: Steffanie Rainwater, MD Reason for referral : High Risk Managed Medicaid (Unsuccessful RNCM follow up telephone outreach)   An unsuccessful telephone outreach was attempted today. The patient was referred to the case management team for assistance with care management and care coordination.   Follow Up Plan: The care management team will reach out to the patient again over the next 14 days.   [name] RN, BSN [place]  [facility]

## 2021-07-05 NOTE — Patient Instructions (Signed)
Visit Information ? ?[name]  - as a part of your Medicaid benefit, you are eligible for care management and care coordination services at no cost or copay. I was unable to reach you by phone today but would be happy to help you with your health related needs. Please feel free to call me @ [phone].  ? ?A member of the Managed Medicaid care management team will reach out to you again over the next 14 days.  ? ?Nicholas Trompeter RN, BSN ?[place]  [facility] ?RN Care Coordinator ?  ?

## 2021-07-22 ENCOUNTER — Other Ambulatory Visit: Payer: Self-pay | Admitting: *Deleted

## 2021-07-22 NOTE — Patient Outreach (Signed)
Care Coordination  07/22/2021  Lucas Cunningham Apr 26, 2000 [id number]   Medicaid Managed Care   Unsuccessful Outreach Note  07/22/2021 Name: Lucas Cunningham MRN: [id number] DOB: [birth year]  Referred by: Steffanie Rainwater, MD Reason for referral : High Risk Managed Medicaid (Unsuccessful RNCM follow up telephone outreach)   A second unsuccessful telephone outreach was attempted today. The patient was referred to the case management team for assistance with care management and care coordination.   Follow Up Plan: The care management team will reach out to the patient again over the next 14 days.   [name] RN, BSN [place]  [facility]

## 2021-07-22 NOTE — Patient Instructions (Signed)
Visit Information ? ?[name]  - as a part of your Medicaid benefit, you are eligible for care management and care coordination services at no cost or copay. I was unable to reach you by phone today but would be happy to help you with your health related needs. Please feel free to call me @ [phone].  ? ?A member of the Managed Medicaid care management team will reach out to you again over the next 14 days.  ? ?Lucill Mauck RN, BSN ?[place]  [facility] ?RN Care Coordinator ?  ?

## 2021-07-25 ENCOUNTER — Telehealth: Payer: Self-pay | Admitting: *Deleted

## 2021-07-25 NOTE — Telephone Encounter (Signed)
Received fax from [facility] that needed most recent office note faxed to pt [phone].  Received fax confirmation received.

## 2021-07-26 NOTE — Telephone Encounter (Signed)
Received fax from [facility] stating prior authorization request for IVIG has been received and will be under review for 14 days. I faxed this to [facility] as FYI  [phone]. Received a receipt of confirmation.

## 2021-07-29 ENCOUNTER — Other Ambulatory Visit: Payer: Self-pay

## 2021-07-29 NOTE — Patient Outreach (Signed)
Care Coordination  07/29/2021  Elster Corbello Apr 20, 2000 [id number]   Medicaid Managed Care   Unsuccessful Outreach Note  07/29/2021 Name: Lucas Cunningham MRN: [id number] DOB: May 16, 2000  Referred by: Steffanie Rainwater, MD Reason for referral : High Risk Managed Medicaid (Unsuccessful RNCM follow up telephone outreach)   Third unsuccessful telephone outreach was attempted today. The patient was referred to the case management team for assistance with care management and care coordination. The patient's primary care provider has been notified of our unsuccessful attempts to make or maintain contact with the patient. The care management team is pleased to engage with this patient at any time in the future should he/she be interested in assistance from the care management team.   Follow Up Plan: We have been unable to make contact with the patient for follow up. The care management team is available to follow up with the patient after provider conversation with the patient regarding recommendation for care management engagement and subsequent re-referral to the care management team.   [name] RN, BSN [place]  [facility] RN Care Coordinator

## 2021-08-22 NOTE — Telephone Encounter (Signed)
Received fax from [facility]/ Plan of treatment.  Needs signature and fax back to [phone].

## 2021-09-07 DIAGNOSIS — Z0271 Encounter for disability determination: Secondary | ICD-10-CM

## 2021-09-12 ENCOUNTER — Telehealth: Payer: Self-pay

## 2021-09-12 NOTE — Telephone Encounter (Signed)
DECISION :   Outcome   Approved today   PA Case: [id number], Status: Approved,    Coverage Starts on: 09/12/2021 12:00:00 AM, Coverage Ends on: 09/12/2022 12:00:00 AM.   Drug Dexcom G6 Sensor     ( COPY SENT TO PHARMACY ALSO )

## 2021-09-12 NOTE — Telephone Encounter (Signed)
Pt for pt ( DEXCOM  G6 SENOR  RENEWAL )  came through via cover my meds ... Was submitted with last office notes awaiting approval or denial

## 2021-10-06 ENCOUNTER — Telehealth: Payer: Self-pay | Admitting: Neurology

## 2021-10-06 NOTE — Telephone Encounter (Signed)
[facility] ([place]) submit key code to coverMyMeds [id number],you can send that key code, PA request will expire 10/09/21. Can call back if have any questions.

## 2021-10-06 NOTE — Telephone Encounter (Signed)
PA has been submitted to plan on Cover My Meds.   KeyPeggyann [place]als - PA Case ID: [id number]

## 2021-10-10 NOTE — Telephone Encounter (Signed)
Approved on August 24 Request Reference Number: [id number]. GAMUNEX-C INJ 5GM/50ML is approved through 01/06/2022. Your patient may now fill this prescription and it will be covered.  Approval notice has been faxed to [facility]. Received a receipt of confirmation.

## 2021-10-27 ENCOUNTER — Other Ambulatory Visit: Payer: Self-pay | Admitting: Student

## 2021-10-27 DIAGNOSIS — E782 Mixed hyperlipidemia: Secondary | ICD-10-CM

## 2021-10-27 DIAGNOSIS — E1065 Type 1 diabetes mellitus with hyperglycemia: Secondary | ICD-10-CM

## 2021-11-24 ENCOUNTER — Telehealth: Payer: Self-pay | Admitting: *Deleted

## 2021-11-24 NOTE — Telephone Encounter (Signed)
Received fax from [facility] [phone], asking for last ofv note.  [phone] was faxed to them at # listed. (Fax confirmation received.).  [phone], trying to get PA for ordered services.  The last PA was approved until 01-06-2022.

## 2021-11-30 NOTE — Telephone Encounter (Signed)
Perfect, thanks 

## 2021-11-30 NOTE — Telephone Encounter (Signed)
Lucas Cunningham w/ [facility] reached out. They are working on the IVIG auth and need a recent office visit. The patient cannot continue to get IVIG if they cannot get it authorized. Clinicals are needed for this.

## 2021-11-30 NOTE — Telephone Encounter (Signed)
Pt accepted 12/14/21 at 2:00 pm.

## 2021-12-14 ENCOUNTER — Encounter: Payer: Self-pay | Admitting: Neurology

## 2021-12-14 ENCOUNTER — Ambulatory Visit (INDEPENDENT_AMBULATORY_CARE_PROVIDER_SITE_OTHER): Payer: Medicare Other | Admitting: Neurology

## 2021-12-14 VITALS — BP 118/74 | HR 74 | Ht 72.0 in | Wt 280.0 lb

## 2021-12-14 DIAGNOSIS — G6181 Chronic inflammatory demyelinating polyneuritis: Secondary | ICD-10-CM | POA: Diagnosis not present

## 2021-12-14 NOTE — Patient Instructions (Signed)
-   Continue IVIG 

## 2021-12-14 NOTE — Progress Notes (Signed)
[facility]    Provider:  Dr Jaynee Eagles Requesting Provider: No ref. provider found Primary Care Provider:  Pcp, No  CC:  Foot drop, right arm wrist drop  12/14/2021: Getting the medication every 4 weeks and can walk. When we first met, with leg weakness, sensory problems with the limbs, had to use a walker, was progressively weakening. Had been to the ED and multiple doctor. Took about 2-3 IVIG sessions and started improving. Now not using a walker and walks "good", he still gets some pain in his hands and feet but much better. But he has more feeling. Prior to IVIG session he starts experiencing worsening symptoms and then after treatment within a few days he is improved again. He gets infusions every 4 weeks, will keep it there, has it down to one day.   Patient complains of symptoms per HPI as well as the following symptoms: numbness . Pertinent negatives and positives per HPI. All others negative   Interval history 01/23/2021: Has only had 2 IVIG. Is feeling better. Power out so appointment performed offline. He is still using his walker. He has pfos. It is time for repeat PT now that he has IVIG, will order and discussed we hopefully can get him walking with a cane an then hopefully without any walking aids. He ay always have some weakness and sensory changes distally, will need OT for fine motor.   Reviewed images (additional 20 minutes in addition to 20 minute appointment) MRI c-spine and L-spine unremarkable  MRI brain: IMPRESSION 03/15/2020: This MRI of the brain with and without contrast shows the following: 1.   The brain and upper cervical spinal cord appear normal. 2.   Bilateral mastoid effusions likely due to eustachian tube dysfunction. 3.   Mild right maxillary chronic sinusitis 4.   Normal enhancement pattern and no acute findings.  Interval history March 11, 2020: Patient is here with his grandmother for evaluation.  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  I gave them literature to read.  HPI:  Lucas Cunningham is a 21 y.o. male here as requested by No ref. provider found for diabetic neuropathy.  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  I reviewed his notes from pediatrics back through 2002 and they are significant for: Multiple admissions for uncontrolled diabetes type 1 and DKA, diabetic neuropathy with leg weakness, sensory problems with the limbs, on gabapentin, last saw neurology in June 2021 and was referred to adult neurologist to manage him, now walks with a walker, mother died in October 02, 2019, he lives with his grandmother.  I reviewed epic and "care everywhere", I did not see any notes from neurology.  Examination from 2021 October showed obesity, standing with the support of a walker, he can get up from chairs, however alert  and bright, normal head face eyes ears, neck visibly normal but does have a thyroid enlargement, lungs are clear to auscultation, heart rates normal without murmurs, abdomen normal, arms normal, no obvious tremor, leg muscles appear normal for age, feet are normally formed, dorsalis pedis pulses 1+, strength was difficult to assess overall, sensation to touch normal in the legs but  decreased in both feet.  He has seen child neurology in the past. Just a few months ago. He can't remember the name. Poor historian. Unclear if he even saw neurology. He is here because his legs are very weak, sensory changes in the legs, been ongoing for about 2 years, when it first started it was "bad", started acutely he says he couldn't stand up. Then he improved. He couldn't step on a curve back then but now he can. Happened in both legs,symmetrical,feels "weird" to touch his legs.  Symptoms started in the right hand a few months ago, his wrist became weak and he can;t lift his hand, after hitting the house, he has numbness in the right hand, tingly, more in digits 1-2 of the right hand. No back pain, no neck pain. Using a walker for since 2020. Sometimes painful in the right hand but not in the legs or feet. Legs are stable. The right   Reviewed notes, labs and imaging from outside physicians, which showed:   10/25/2019: hgba1c 14.4  Review of Systems: Patient complains of symptoms per HPI as well as the following symptoms: CIDP. Pertinent negatives and positives per HPI. All others negative.   Social History   Socioeconomic History   Marital status: Single    Spouse name: Not on file   Number of children: Not on file   Years of education: Not on file   Highest education level: Not on file  Occupational History   Not on file  Tobacco Use   Smoking status: Never    Passive exposure: Yes   Smokeless tobacco: Never  Vaping Use   Vaping Use: Some days  Substance and Sexual Activity   Alcohol use: Not Currently   Drug use: Yes    Types: Marijuana   Sexual activity: Not Currently  Other Topics Concern   Not on file  Social History Narrative   Lives with his grandmother. He graduated HS 2021   Right handed   Caffeine: 2 cups/day   Social Determinants of Health   Financial Resource Strain: Not on file  Food Insecurity: Food Insecurity Present (03/18/2021)   Hunger Vital Sign     Worried About Running Out of Food in the Last Year: Sometimes true    Ran Out of Food in the Last Year: Sometimes true  Transportation Needs: Unmet Transportation Needs (04/29/2021)   PRAPARE - Hydrologist (Medical): Yes    Lack of Transportation (Non-Medical): No  Physical Activity: Insufficiently Active (03/18/2021)   Exercise Vital Sign    Days of Exercise per Week: 2 days    Minutes of Exercise per Session: 20 min  Stress: Not on file  Social Connections: Socially Isolated (11/19/2020)   Social Connection and Isolation Panel [NHANES]    Frequency of Communication with Friends and Family: More than three times a week    Frequency of Social Gatherings with Friends and Family: Never    Attends Religious Services: Never    Marine scientist or Organizations: No    Attends Archivist Meetings: Never    Marital Status: Never married  Intimate Partner Violence: Not on file    Family History  Problem Relation Age of Onset   Heart disease Mother    Hypertension Mother    Diabetes Maternal Grandmother    Diabetes Maternal Grandfather    Diabetes Paternal Grandmother    Migraines Neg Hx    Seizures Neg Hx    Autism Neg Hx    ADD / ADHD Neg Hx    Anxiety disorder Neg Hx    Depression Neg Hx    Bipolar disorder Neg Hx    Schizophrenia Neg Hx     Past Medical History:  Diagnosis Date   CIDP (chronic inflammatory demyelinating polyneuropathy) (HCC)    Diabetes mellitus without complication ([place])    type 1    Patient Active Problem List   Diagnosis Date Noted   Proteinuria 11/23/2020   CIDP (chronic inflammatory demyelinating polyneuropathy) ([place]) 05/25/2020   Noncompliance with medication treatment due to difficulty with dosing 05/25/2020   Mixed hyperlipidemia 05/25/2020   Depression 05/25/2020   Diabetic polyneuropathy associated with type 1 diabetes mellitus ([place]) 02/26/2019   Postprandial bloating 02/25/2019   Sensory problems  with limbs 02/25/2019   Uncontrolled type 1 diabetes mellitus with hyperglycemia ([place]) 05/24/2017   Morbid obesity ([place]) 05/24/2017    Past Surgical History:  Procedure Laterality Date   NO PAST SURGERIES      Current Outpatient Medications  Medication Sig Dispense Refill   atorvastatin (LIPITOR) 10 MG tablet TAKE ONE TABLET BY MOUTH ONCE DAILY 90 tablet 2   COMFORT EZ PEN NEEDLES 32G X 4 MM MISC INJECT UP TO EIGHT times PER DAY 300 each 6   Continuous Blood Gluc Receiver (DEXCOM G6 RECEIVER) DEVI 1 Device by Does not apply route as directed. 1 each 2   Continuous Blood Gluc Sensor (DEXCOM G6 SENSOR) MISC Inject 1 applicator into the skin as directed. (change sensor every 10 days) 3 each 11   Continuous Blood Gluc Transmit (DEXCOM G6 TRANSMITTER) MISC Inject 1 Device into the skin as directed. (re-use up to 8x with each new sensor) 1 each 3   Dulaglutide (TRULICITY) 1.5 NI/7.7OE SOPN Inject 1.5 mg into the skin once a week. 2 mL 3   GAMUNEX-C 20 GM/200ML SOLN      GAMUNEX-C 5 GM/50ML SOLN      insulin glargine (LANTUS) 100 UNIT/ML Solostar Pen Inject 30 Units into the skin daily. 15 mL 3   insulin lispro (HUMALOG KWIKPEN) 200 UNIT/ML KwikPen Inject 30 units three times daily with meals 30 mL 11   PRESCRIPTION MEDICATION Inject into the vein. IVIG every 4 weeks. Order for new start sent to [place] on 04/08/2020.     gabapentin (NEURONTIN) 300 MG capsule TAKE ONE CAPSULE BY MOUTH TWICE DAILY (Patient not taking: Reported on 12/14/2021) 180 capsule 2   No current facility-administered medications for this visit.    Allergies as of 12/14/2021   (No Known Allergies)    Vitals: BP 118/74   Pulse 74   Ht 6' (1.829 m)   Wt 280 lb (127 kg)   BMI 37.97 kg/m  Last Weight:  Wt Readings from Last 1 Encounters:  12/14/21 280 lb (127 kg)   Last Height:   Ht Readings from Last 1 Encounters:  12/14/21 6' (1.829 m)     Physical exam: Exam: Gen: NAD, conversant, well  nourised, obese, well groomed  CV: RRR, no MRG. No Carotid Bruits. No peripheral edema, warm, nontender Eyes: Conjunctivae clear without exudates or hemorrhage  Neuro: Detailed Neurologic Exam  Speech:    Speech is normal; fluent and spontaneous with normal comprehension.  Cognition:    The patient is oriented to person, place, and time;     recent and remote memory intact;     language fluent;     normal attention, concentration,     fund of knowledge Cranial Nerves:    The pupils are equal, round, and reactive to light. Attempted fundoscopy could not visualize.  Visual fields are full to finger confrontation. Extraocular movements are intact. Trigeminal sensation is intact and the muscles of mastication are normal. The face is symmetric. The palate elevates in the midline. Hearing intact. Voice is normal. Shoulder shrug is normal. The tongue has normal motion without fasciculations.   Coordination:    No dysmetria or ataxia  Gait:    Prior Steppage gait - today he can walk on his heels with a little difficulty but no steppage gait or significant foot drop, not ataxic, not using a walking aid  Motor Observation:    No asymmetry, no atrophy, and no involuntary movements noted. Tone:    Normal muscle tone.    Posture:    Posture is normal. normal erect    Strength: Today 5/5 except 3+/5 bilateral foot dorsiflexion.   Prior(deltoid intact strength, biceps 4/5, triceps 3+-4/5, right wrist drop, Hip flexion 4+/5. Leg flex.extension 5/5. 0/5 DF/PF feet).      Sensation: intact to LT, pinprick, temp, absent vibration distally in the feet at great toes. (Improved today can feel a few beats vibration and proprioception is intact)     Reflex Exam:  DTR's: absent lowers, trace biceps and triceps, 1+ brachiradialis. (Stable)  Toes:    The toes are equiv bilaterally.   Clonus:    Clonus is absent.    Assessment/Plan:   This is a 21 year old male with  uncontrolled type 1 diabetes diagnosed with CIDP with tremendous improvement on IVIG needing continued IVIG treatment  We initially met patient he was ataxic using a walker.  He has been on IVIG for almost a year now.  His response has been remarkable, he is no longer using a walking aid, he no longer has a steppage gait, he is no longer ataxic, he still has some sensory deficits in his feet and hands and reduced reflexes but overall extremely improved especially with strength.  I recommend he continue IVIG on the current schedule, he does feel as though he starts to decline right before his IVIG is due so I would not push his IVIG out any further, we will continue IVIG in this patient with CIDP with amazing response and recovery but with still continued deficits which worsen as the IVIG wears off telling me that he still needs continued treatment with IVIG.   Cc: No ref. provider found,  Pcp, No  I spent 30 minutes of face-to-face and non-face-to-face time with patient on the  1. CIDP (chronic inflammatory demyelinating polyneuropathy) (HCC)    diagnosis.  This included previsit chart review, lab review, study review, order entry, electronic health record documentation, patient education on the different diagnostic and therapeutic options, counseling and coordination of care, risks and benefits of management, compliance, or risk factor reduction  Sarina Ill, MD  [facility] [address] [place], [place] [address]  Phone [phone] Fax [phone]

## 2021-12-20 NOTE — Telephone Encounter (Signed)
Faxed 12/14/21 office note to [facility] as requested. Received a receipt of confirmation.

## 2021-12-26 ENCOUNTER — Telehealth: Payer: Self-pay | Admitting: *Deleted

## 2021-12-26 NOTE — Telephone Encounter (Signed)
[facility] Key[facility]us Mallick - PA Case ID: [id number] Need help? Call us at [phone] Status  PA Gamunex -C complete waiting on approval

## 2021-12-26 NOTE — Telephone Encounter (Signed)
Approvedtoday Request Reference Number: [id number]. GAMUNEX-C INJ 20GM/200 is approved through 02/13/2023. Your patient may now fill this prescription and it will be covered.   PA Gamunex -C  Approved   Ronne Binning from [facility]  aware of approval

## 2022-01-09 ENCOUNTER — Encounter: Payer: Self-pay | Admitting: Neurology

## 2022-01-09 ENCOUNTER — Telehealth: Payer: Self-pay | Admitting: Neurology

## 2022-01-09 NOTE — Telephone Encounter (Signed)
Sent letter to inform pt of appointment change from 09/14/22 to 09/20/22 due to provider template change

## 2022-01-24 ENCOUNTER — Other Ambulatory Visit: Payer: Self-pay | Admitting: Student

## 2022-01-24 DIAGNOSIS — E1065 Type 1 diabetes mellitus with hyperglycemia: Secondary | ICD-10-CM

## 2022-01-24 DIAGNOSIS — E1042 Type 1 diabetes mellitus with diabetic polyneuropathy: Secondary | ICD-10-CM

## 2022-01-31 ENCOUNTER — Other Ambulatory Visit: Payer: Self-pay | Admitting: Student

## 2022-01-31 DIAGNOSIS — E1042 Type 1 diabetes mellitus with diabetic polyneuropathy: Secondary | ICD-10-CM

## 2022-01-31 DIAGNOSIS — E1065 Type 1 diabetes mellitus with hyperglycemia: Secondary | ICD-10-CM

## 2022-02-01 NOTE — Telephone Encounter (Signed)
He will need an appointment before further refills can we please schedule him an appointment? Thank you

## 2022-02-01 NOTE — Telephone Encounter (Signed)
No longer [facility] patient as of 09/02/2021. Pt transferred care to [facility].

## 2022-04-05 ENCOUNTER — Other Ambulatory Visit: Payer: Self-pay | Admitting: Student

## 2022-04-05 DIAGNOSIS — E1065 Type 1 diabetes mellitus with hyperglycemia: Secondary | ICD-10-CM

## 2022-04-06 ENCOUNTER — Other Ambulatory Visit: Payer: Self-pay | Admitting: Student

## 2022-04-06 DIAGNOSIS — E1065 Type 1 diabetes mellitus with hyperglycemia: Secondary | ICD-10-CM

## 2022-04-06 NOTE — Telephone Encounter (Signed)
Please call his grandmother to schedule a diabetes follow up visit.

## 2022-05-24 ENCOUNTER — Other Ambulatory Visit: Payer: Self-pay

## 2022-05-24 DIAGNOSIS — E1065 Type 1 diabetes mellitus with hyperglycemia: Secondary | ICD-10-CM

## 2022-06-01 ENCOUNTER — Other Ambulatory Visit: Payer: Self-pay

## 2022-06-01 DIAGNOSIS — E1065 Type 1 diabetes mellitus with hyperglycemia: Secondary | ICD-10-CM

## 2022-06-02 NOTE — Telephone Encounter (Signed)
Please call patient or his grandmother to make another appt for him. Also ask if they still want [name] to be his PCP because they have missed multiple appts over the last 2 years.

## 2022-06-06 NOTE — Telephone Encounter (Signed)
Okay, thanks for the update. They should take care of his refills from now on.

## 2022-06-06 NOTE — Telephone Encounter (Signed)
PCP was removed on 09/02/2021 due to patient has transferred care to [facility].  Comment also was noted on patient's chart at that time.  Forwarding back to Dr. Kirke Corin and triage nurse.

## 2022-06-24 ENCOUNTER — Inpatient Hospital Stay (HOSPITAL_COMMUNITY)
Admission: EM | Admit: 2022-06-24 | Discharge: 2022-06-26 | DRG: 638 | Disposition: A | Discharge status: Home / Self Care | Payer: 59 | Attending: Internal Medicine | Admitting: Internal Medicine

## 2022-06-24 ENCOUNTER — Other Ambulatory Visit: Payer: Self-pay

## 2022-06-24 ENCOUNTER — Emergency Department (HOSPITAL_COMMUNITY): Payer: 59

## 2022-06-24 ENCOUNTER — Encounter (HOSPITAL_COMMUNITY): Payer: Self-pay

## 2022-06-24 DIAGNOSIS — Z7985 Long-term (current) use of injectable non-insulin antidiabetic drugs: Secondary | ICD-10-CM

## 2022-06-24 DIAGNOSIS — E781 Pure hyperglyceridemia: Secondary | ICD-10-CM | POA: Insufficient documentation

## 2022-06-24 DIAGNOSIS — G6181 Chronic inflammatory demyelinating polyneuritis: Secondary | ICD-10-CM | POA: Diagnosis present

## 2022-06-24 DIAGNOSIS — E876 Hypokalemia: Secondary | ICD-10-CM | POA: Diagnosis present

## 2022-06-24 DIAGNOSIS — Z794 Long term (current) use of insulin: Secondary | ICD-10-CM

## 2022-06-24 DIAGNOSIS — E1110 Type 2 diabetes mellitus with ketoacidosis without coma: Secondary | ICD-10-CM | POA: Diagnosis not present

## 2022-06-24 DIAGNOSIS — Z6837 Body mass index (BMI) 37.0-37.9, adult: Secondary | ICD-10-CM

## 2022-06-24 DIAGNOSIS — Z91148 Patient's other noncompliance with medication regimen for other reason: Secondary | ICD-10-CM

## 2022-06-24 DIAGNOSIS — E88819 Insulin resistance, unspecified: Secondary | ICD-10-CM | POA: Diagnosis present

## 2022-06-24 DIAGNOSIS — Z79899 Other long term (current) drug therapy: Secondary | ICD-10-CM

## 2022-06-24 DIAGNOSIS — E860 Dehydration: Secondary | ICD-10-CM | POA: Diagnosis present

## 2022-06-24 DIAGNOSIS — Z833 Family history of diabetes mellitus: Secondary | ICD-10-CM

## 2022-06-24 DIAGNOSIS — Z8249 Family history of ischemic heart disease and other diseases of the circulatory system: Secondary | ICD-10-CM

## 2022-06-24 DIAGNOSIS — E1010 Type 1 diabetes mellitus with ketoacidosis without coma: Secondary | ICD-10-CM | POA: Diagnosis not present

## 2022-06-24 DIAGNOSIS — E1065 Type 1 diabetes mellitus with hyperglycemia: Secondary | ICD-10-CM | POA: Diagnosis present

## 2022-06-24 LAB — BASIC METABOLIC PANEL
Anion gap: 18 — ABNORMAL HIGH (ref 5–15)
Anion gap: 16 — ABNORMAL HIGH (ref 5–15)
BUN: 9 mg/dL (ref 6–20)
BUN: 8 mg/dL (ref 6–20)
CO2: 8 mmol/L — ABNORMAL LOW (ref 22–32)
CO2: 10 mmol/L — ABNORMAL LOW (ref 22–32)
Calcium: 9.2 mg/dL (ref 8.9–10.3)
Calcium: 9.2 mg/dL (ref 8.9–10.3)
Chloride: 101 mmol/L (ref 98–111)
Chloride: 102 mmol/L (ref 98–111)
Creatinine, Ser: 0.93 mg/dL (ref 0.61–1.24)
Creatinine, Ser: 0.93 mg/dL (ref 0.61–1.24)
GFR, Estimated: >60 mL/min (ref >60)
GFR, Estimated: >60 mL/min (ref >60)
Glucose, Bld: 312 mg/dL — ABNORMAL HIGH (ref 70–99)
Glucose, Bld: 268 mg/dL — ABNORMAL HIGH (ref 70–99)
Potassium: 4.4 mmol/L (ref 3.5–5.1)
Potassium: 3.9 mmol/L (ref 3.5–5.1)
Sodium: 127 mmol/L — ABNORMAL LOW (ref 135–145)
Sodium: 128 mmol/L — ABNORMAL LOW (ref 135–145)

## 2022-06-24 LAB — CBC WITH DIFFERENTIAL/PLATELET
Abs Immature Granulocytes: 0.13 10*3/uL — ABNORMAL HIGH (ref 0.00–0.07)
Basophils Absolute: 0.1 10*3/uL (ref 0.0–0.1)
Basophils Relative: 0 %
Eosinophils Absolute: 0 10*3/uL (ref 0.0–0.5)
Eosinophils Relative: 0 %
HCT: 56.3 % — ABNORMAL HIGH (ref 39.0–52.0)
Hemoglobin: 18 g/dL — ABNORMAL HIGH (ref 13.0–17.0)
Immature Granulocytes: 1 %
Lymphocytes Relative: 12 %
Lymphs Abs: 1.7 10*3/uL (ref 0.7–4.0)
MCH: 27.4 pg (ref 26.0–34.0)
MCHC: 32 g/dL (ref 30.0–36.0)
MCV: 85.8 fL (ref 80.0–100.0)
Monocytes Absolute: 1 10*3/uL (ref 0.1–1.0)
Monocytes Relative: 7 %
Neutro Abs: 10.9 10*3/uL — ABNORMAL HIGH (ref 1.7–7.7)
Neutrophils Relative %: 80 %
Platelets: 260 10*3/uL (ref 150–400)
RBC: 6.56 MIL/uL — ABNORMAL HIGH (ref 4.22–5.81)
RDW: 14.6 % (ref 11.5–15.5)
WBC: 13.8 10*3/uL — ABNORMAL HIGH (ref 4.0–10.5)
nRBC: 0 % (ref 0.0–0.2)

## 2022-06-24 LAB — CBG MONITORING, ED
Glucose-Capillary: 578 mg/dL (ref 70–99)
Glucose-Capillary: 511 mg/dL (ref 70–99)
Glucose-Capillary: 413 mg/dL — ABNORMAL HIGH (ref 70–99)
Glucose-Capillary: 325 mg/dL — ABNORMAL HIGH (ref 70–99)

## 2022-06-24 LAB — HEPATIC FUNCTION PANEL
ALT: 15 U/L (ref 0–44)
AST: 13 U/L — ABNORMAL LOW (ref 15–41)
Albumin: 3.6 g/dL (ref 3.5–5.0)
Alkaline Phosphatase: 108 U/L (ref 38–126)
Bilirubin, Direct: 0.2 mg/dL (ref 0.0–0.2)
Indirect Bilirubin: 1.5 mg/dL — ABNORMAL HIGH (ref 0.3–0.9)
Total Bilirubin: 1.7 mg/dL — ABNORMAL HIGH (ref 0.3–1.2)
Total Protein: 7.3 g/dL (ref 6.5–8.1)

## 2022-06-24 LAB — I-STAT VENOUS BLOOD GAS, ED
Acid-base deficit: 21 mmol/L — ABNORMAL HIGH (ref 0.0–2.0)
Bicarbonate: 6 mmol/L — ABNORMAL LOW (ref 20.0–28.0)
Calcium, Ion: 1.23 mmol/L (ref 1.15–1.40)
HCT: 60 % — ABNORMAL HIGH (ref 39.0–52.0)
Hemoglobin: 20.4 g/dL — ABNORMAL HIGH (ref 13.0–17.0)
O2 Saturation: 45 %
Patient temperature: 97.7
Potassium: 5.6 mmol/L — ABNORMAL HIGH (ref 3.5–5.1)
Sodium: 125 mmol/L — ABNORMAL LOW (ref 135–145)
TCO2: 7 mmol/L — ABNORMAL LOW (ref 22–32)
pCO2, Ven: 19.1 mmHg — CL (ref 44–60)
pH, Ven: 7.106 — CL (ref 7.25–7.43)
pO2, Ven: 31 mmHg — CL (ref 32–45)

## 2022-06-24 LAB — GLUCOSE, CAPILLARY
Glucose-Capillary: 354 mg/dL — ABNORMAL HIGH (ref 70–99)
Glucose-Capillary: 257 mg/dL — ABNORMAL HIGH (ref 70–99)
Glucose-Capillary: 318 mg/dL — ABNORMAL HIGH (ref 70–99)
Glucose-Capillary: 276 mg/dL — ABNORMAL HIGH (ref 70–99)
Glucose-Capillary: 251 mg/dL — ABNORMAL HIGH (ref 70–99)

## 2022-06-24 LAB — BETA-HYDROXYBUTYRIC ACID: Beta-Hydroxybutyric Acid: 6.1 mmol/L — ABNORMAL HIGH (ref 0.05–0.27)

## 2022-06-24 LAB — HEMOGLOBIN A1C
Hgb A1c MFr Bld: 13.3 % — ABNORMAL HIGH (ref 4.8–5.6)
Mean Plasma Glucose: 335.01 mg/dL

## 2022-06-24 MED ORDER — DEXTROSE IN LACTATED RINGERS 5 % IV SOLN: INTRAVENOUS | Status: DC

## 2022-06-24 MED ORDER — ONDANSETRON HCL 4 MG/2ML IJ SOLN: 4.0000 mg | Freq: Four times a day (QID) | INTRAMUSCULAR | Status: DC | PRN

## 2022-06-24 MED ORDER — ENOXAPARIN SODIUM 60 MG/0.6ML IJ SOSY
60.0000 mg | PREFILLED_SYRINGE | INTRAMUSCULAR | Status: DC
  Administered 2022-06-24 – 2022-06-25 (×2): 60 mg via SUBCUTANEOUS
  Filled 2022-06-24 (×2): qty 0.6

## 2022-06-24 MED ORDER — HYDROMORPHONE HCL 1 MG/ML IJ SOLN
0.5000 mg | Freq: Once | INTRAMUSCULAR | Status: AC
  Administered 2022-06-24: 0.5 mg via INTRAVENOUS
  Filled 2022-06-24: qty 1

## 2022-06-24 MED ORDER — HYDROMORPHONE HCL 1 MG/ML IJ SOLN: 0.5000 mg | INTRAMUSCULAR | Status: DC | PRN

## 2022-06-24 MED ORDER — ONDANSETRON HCL 4 MG/2ML IJ SOLN
4.0000 mg | Freq: Once | INTRAMUSCULAR | Status: AC
  Administered 2022-06-24: 4 mg via INTRAVENOUS
  Filled 2022-06-24: qty 2

## 2022-06-24 MED ORDER — LACTATED RINGERS IV BOLUS
2500.0000 mL | Freq: Once | INTRAVENOUS | Status: AC
  Administered 2022-06-24: 2500 mL via INTRAVENOUS

## 2022-06-24 MED ORDER — INSULIN REGULAR(HUMAN) IN NACL 100-0.9 UT/100ML-% IV SOLN
INTRAVENOUS | Status: DC
  Administered 2022-06-24 – 2022-06-25 (×3): 13 [IU]/h via INTRAVENOUS
  Filled 2022-06-24 (×3): qty 100

## 2022-06-24 MED ORDER — LACTATED RINGERS IV SOLN: INTRAVENOUS | Status: DC

## 2022-06-24 MED ORDER — DEXTROSE 50 % IV SOLN: 0.0000 mL | INTRAVENOUS | Status: DC | PRN

## 2022-06-24 NOTE — Hospital Course (Signed)
Principal Problem:   DKA (diabetic ketoacidosis) (HCC) Active Problems:   Uncontrolled type 1 diabetes mellitus with hyperglycemia (HCC)   Morbid obesity (HCC)   CIDP (chronic inflammatory demyelinating polyneuropathy) (HCC)  Resolved Problems:   * No resolved hospital problems. *  Consults:***  Procedures:***  Follow-up items: - insulin pump

## 2022-06-24 NOTE — ED Provider Notes (Signed)
[place] [facility]ERGENC[facility] DEPARTMENT AT [facility] Provider Note   CSN: [id number] Arrival date & time: 06/24/22  1437     History  Chief Complaint  Patient presents with   Abdominal Pain   Hyperglycemia    Lucas Cunningham is a 22 y.o. male.   Abdominal Pain Associated symptoms: fatigue, nausea and vomiting   Hyperglycemia Associated symptoms: abdominal pain, dizziness, fatigue, nausea and vomiting   Patient presents for abdominal pain, nausea, and vomiting.  Medical history includes T1DM, HLD, neuropathy, CIDP, depression.  He began to feel unwell yesterday.  He had 2 episodes of vomiting yesterday.  He has had right-sided abdominal pain.  Patient reports that he does not take his insulin or check his blood sugars.  He has had dizziness today.  He does have current nausea.  Current pain is 8/10 in severity.     Home Medications Prior to Admission medications   Medication Sig Start Date End Date Taking? Authorizing Provider  atorvastatin (LIPITOR) 10 MG tablet TAKE ONE TABLET BY MOUTH ONCE DAILY Patient taking differently: Take 10 mg by mouth daily. 10/27/21  Yes Adron Bene, MD  GAMUNEX-C 20 GM/200ML SOLN Inject 800 mLs into the vein every 28 (twenty-eight) days. 12/02/20  Yes [provider]  GAMUNEX-C 5 GM/50ML SOLN Inject 50 mLs into the vein every 28 (twenty-eight) days. 12/02/20  Yes [provider]  insulin lispro (HUMALOG KWIKPEN) 200 UNIT/ML KwikPen INJECT 30 UNITS SUBCUTANEOUSLY THREE TIMES DAILY with meals Patient taking differently: Inject 30 Units into the skin with breakfast, with lunch, and with evening meal. 04/06/22  Yes Amponsah, Flossie Buffy, MD  COMFORT EZ PEN NEEDLES 32G X 4 MM MISC INJECT UP TO EIGHT TIMES DAILY 06/02/22   Steffanie Rainwater, MD  Continuous Blood Gluc Receiver (DEXCOM G6 RECEIVER) DEVI 1 Device by Does not apply route as directed. 05/02/21   Steffanie Rainwater, MD  Continuous Blood Gluc Sensor (DEXCOM G6 SENSOR) MISC  INJECT ONE applicator into THE SKIN AS DIRECTED AND CHANGE EVERY 10 DAYS 04/10/22   Reymundo Poll, MD  Continuous Blood Gluc Transmit (DEXCOM G6 TRANSMITTER) MISC INJECT ONE device into THE SKIN AS DIRECTED. reuse UP TO EIGHT times WITH each new sensor 04/06/22   Steffanie Rainwater, MD  insulin glargine (LANTUS) 100 UNIT/ML Solostar Pen Inject 30 Units into the skin daily. Patient not taking: Reported on 06/24/2022 03/31/21   Steffanie Rainwater, MD  TRULICITY 1.5 MG/0.5ML SOPN INJECT 1.5 MG into THE SKIN ONCE A WEEK Patient not taking: Reported on 06/24/2022 04/06/22   Steffanie Rainwater, MD      Allergies    Patient has no known allergies.    Review of Systems   Review of Systems  Constitutional:  Positive for fatigue.  Gastrointestinal:  Positive for abdominal pain, nausea and vomiting.  Neurological:  Positive for dizziness.  All other systems reviewed and are negative.   Physical Exam Updated Vital Signs BP (!) 141/100 (BP Location: Right Arm)   Pulse (!) 139   Temp 98.1 F (36.7 C) (Oral)   Resp 16   Ht 6' (1.829 m)   Wt 127 kg   SpO2 96%   BMI 37.97 kg/m  Physical Exam Vitals and nursing note reviewed.  Constitutional:      General: He is not in acute distress.    Appearance: He is well-developed. He is ill-appearing. He is not toxic-appearing or diaphoretic.  HENT:     Head: Normocephalic and atraumatic.  Mouth/Throat:     Mouth: Mucous membranes are moist.  Eyes:     General: No scleral icterus.    Extraocular Movements: Extraocular movements intact.     Conjunctiva/sclera: Conjunctivae normal.  Cardiovascular:     Rate and Rhythm: Normal rate and regular rhythm.  Pulmonary:     Effort: Pulmonary effort is normal. No respiratory distress.  Abdominal:     Palpations: Abdomen is soft.     Tenderness: There is abdominal tenderness in the right upper quadrant and right lower quadrant. There is no guarding or rebound.  Musculoskeletal:        General: No  swelling.     Cervical back: Neck supple.  Skin:    General: Skin is warm and dry.     Capillary Refill: Capillary refill takes less than 2 seconds.  Neurological:     General: No focal deficit present.     Mental Status: He is alert and oriented to person, place, and time.  Psychiatric:        Mood and Affect: Mood normal.        Behavior: Behavior normal.     ED Results / Procedures / Treatments   Labs (all labs ordered are listed, but only abnormal results are displayed) Labs Reviewed  BETA-HYDROXYBUTYRIC ACID - Abnormal; Notable for the following components:      Result Value   Beta-Hydroxybutyric Acid 6.10 (*)    All other components within normal limits  CBC WITH DIFFERENTIAL/PLATELET - Abnormal; Notable for the following components:   WBC 13.8 (*)    RBC 6.56 (*)    Hemoglobin 18.0 (*)    HCT 56.3 (*)    Neutro Abs 10.9 (*)    Abs Immature Granulocytes 0.13 (*)    All other components within normal limits  CBG MONITORING, ED - Abnormal; Notable for the following components:   Glucose-Capillary 578 (*)    All other components within normal limits  I-STAT VENOUS BLOOD GAS, ED - Abnormal; Notable for the following components:   pH, Ven 7.106 (*)    pCO2, Ven 19.1 (*)    pO2, Ven 31 (*)    Bicarbonate 6.0 (*)    TCO2 7 (*)    Acid-base deficit 21.0 (*)    Sodium 125 (*)    Potassium 5.6 (*)    HCT 60.0 (*)    Hemoglobin 20.4 (*)    All other components within normal limits  CBG MONITORING, ED - Abnormal; Notable for the following components:   Glucose-Capillary 511 (*)    All other components within normal limits  CBG MONITORING, ED - Abnormal; Notable for the following components:   Glucose-Capillary 413 (*)    All other components within normal limits  URINALYSIS, ROUTINE W REFLEX MICROSCOPIC  BASIC METABOLIC PANEL  BASIC METABOLIC PANEL  BASIC METABOLIC PANEL  BASIC METABOLIC PANEL  COMPREHENSIVE METABOLIC PANEL  LIPASE, BLOOD    EKG EKG  Interpretation  Date/Time:  Saturday Jun 24 2022 14:46:38 EDT Ventricular Rate:  139 PR Interval:  146 QRS Duration: 84 QT Interval:  276 QTC Calculation: 419 R Axis:   119 Text Interpretation: Sinus tachycardia Right atrial enlargement Right axis deviation Confirmed by Gloris Manchester ([id number]) on 06/24/2022 3:24:26 PM  Radiology CT ABDOMEN PELVIS WO CONTRAST  Result Date: 06/24/2022 CLINICAL DATA:  Right lower quadrant abdominal pain. EXAM: CT ABDOMEN AND PELVIS WITHOUT CONTRAST TECHNIQUE: Multidetector CT imaging of the abdomen and pelvis was performed following the standard protocol without IV contrast.  RADIATION DOSE REDUCTION: This exam was performed according to the departmental dose-optimization program which includes automated exposure control, adjustment of the mA and/or kV according to patient size and/or use of iterative reconstruction technique. COMPARISON:  None Available. FINDINGS: Lower chest: No acute abnormality. Evaluation of the abdominal viscera somewhat limited by the lack of IV contrast. Hepatobiliary: Diffuse hepatic steatosis. No focal liver abnormality is seen. Normal gallbladder. Pancreas: Unremarkable. No surrounding inflammatory changes. Spleen: Normal in size without focal abnormality. Adrenals/Urinary Tract: Adrenal glands are unremarkable. Kidneys are normal, without renal calculi, focal lesion, or hydronephrosis. Bladder is unremarkable. Stomach/Bowel: Stomach is within normal limits. Appendix appears normal. No evidence of bowel wall thickening, distention, or inflammatory changes. Vascular/Lymphatic: No enlarged abdominal or pelvic lymph nodes. Reproductive: Prostate is unremarkable. Other: No abdominal wall hernia or abnormality. No abdominopelvic ascites. Musculoskeletal: No acute or significant osseous findings. IMPRESSION: 1. No acute finding in the abdomen or pelvis on a noncontrast exam. Normal appendix. 2. Diffuse hepatic steatosis. Electronically Signed   By: Emmaline Kluver M.D.   On: 06/24/2022 15:54    Procedures Procedures    Medications Ordered in ED Medications  insulin regular, human (MYXREDLIN) 100 units/ 100 mL infusion (13 Units/hr Intravenous New Bag/Given 06/24/22 1522)  lactated ringers infusion (has no administration in time range)  dextrose 5 % in lactated ringers infusion (has no administration in time range)  dextrose 50 % solution 0-50 mL (has no administration in time range)  lactated ringers bolus 2,500 mL (2,500 mLs Intravenous New Bag/Given 06/24/22 1522)  ondansetron (ZOFRAN) injection 4 mg (4 mg Intravenous Given 06/24/22 1519)  HYDROmorphone (DILAUDID) injection 0.5 mg (0.5 mg Intravenous Given 06/24/22 1519)    ED Course/ Medical Decision Making/ A&P                             Medical Decision Making Amount and/or Complexity of Data Reviewed Labs: ordered. Radiology: ordered.  Risk Prescription drug management.   This patient presents to the ED for concern of abdominal pain, nausea, vomiting, this involves an extensive number of treatment options, and is a complaint that carries with it a high risk of complications and morbidity.  The differential diagnosis includes DKA, enteritis, dehydration, substance abuse, appendicitis, hepatitis, cholecystitis, pancreatitis   Co morbidities that complicate the patient evaluation  T1DM, HLD, neuropathy, CIDP, depression   Additional history obtained:  Additional history obtained from N/A External records from outside source obtained and reviewed including EMR   Lab Tests:  I Ordered, and personally interpreted labs.  The pertinent results include: Hyperglycemia, metabolic acidosis, and elevated beta hydroxybutyrate consistent with DKA; elevated hemoglobin and leukocytosis consistent with hemoconcentration   Imaging Studies ordered:  I ordered imaging studies including CT of abdomen pelvis I independently visualized and interpreted imaging which showed no acute  findings I agree with the radiologist interpretation   Cardiac Monitoring: / EKG:  The patient was maintained on a cardiac monitor.  I personally viewed and interpreted the cardiac monitored which showed an underlying rhythm of: Sinus rhythm   Problem List / ED Course / Critical interventions / Medication management  Patient Modena Jansky for pain, nausea, vomiting, and fatigue.  Vital signs on arrival notable for tachycardia in the range of 140.  Initial blood gas shows a pH of 7.099 with CBG of 578.  Presentation is consistent with DKA.  On exam, patient is alert and oriented.  He describes a right-sided abdominal pain that has  been present since yesterday.  He does have current nausea.  IV fluids and insulin gtt. were initiated.  Patient was ordered Dilaudid and Zofran for symptomatic relief.  CT scan was ordered to evaluate abdominal pain.  Lab work shows further evidence of DKA.  CT scan was negative for acute findings.  Patient was admitted to medicine for further management. I ordered medication including IV fluids and insulin gtt. for DKA; Dilaudid for analgesia; Zofran for nausea Reevaluation of the patient after these medicines showed that the patient improved I have reviewed the patients home medicines and have made adjustments as needed   Social Determinants of Health:  Has access to outpatient care  CRITICAL CARE Performed by: Gloris Manchester   Total critical care time: 35 minutes  Critical care time was exclusive of separately billable procedures and treating other patients.  Critical care was necessary to treat or prevent imminent or life-threatening deterioration.  Critical care was time spent personally by me on the following activities: development of treatment plan with patient and/or surrogate as well as nursing, discussions with consultants, evaluation of patient's response to treatment, examination of patient, obtaining history from patient or surrogate, ordering and  performing treatments and interventions, ordering and review of laboratory studies, ordering and review of radiographic studies, pulse oximetry and re-evaluation of patient's condition.         Final Clinical Impression(s) / ED Diagnoses Final diagnoses:  Diabetic ketoacidosis without coma associated with type 1 diabetes mellitus [facility])    Rx / DC Orders ED Discharge Orders     None         Gloris Manchester, MD 06/24/22 1643

## 2022-06-24 NOTE — ED Notes (Signed)
ED TO INPATIENT HANDOFF REPORT  ED Nurse Name and Phone #: Vernona Rieger [phone]  S Name/Age/Gender Lucas Cunningham 22 y.o. male Room/Bed: 005C/005C  Code Status   Code Status: Full Code  Home/SNF/Other Home Patient oriented to: self, place, time, and situation Is this baseline? Yes   Triage Complete: Triage complete  Chief Complaint DKA (diabetic ketoacidosis) (HCC) [E11.10]  Triage Note Patient complains of right upper and lower quad pain that started last night with vomiting and headache.  Reports he is feeling dizzy. Patient reports he does not check his glucose or take his diabetes medication.    Allergies No Known Allergies  Level of Care/Admitting Diagnosis ED Disposition     ED Disposition  Admit   Condition  --   Comment  Hospital Area: [facility] [[id number]]  Level of Care: Progressive [102]  Admit to Progressive based on following criteria: MULTISYSTEM THREATS such as stable sepsis, metabolic/electrolyte imbalance with or without encephalopathy that is responding to early treatment.  May place patient in observation at [facility] or [facility] if equivalent level of care is available:: No  Covid Evaluation: Asymptomatic - no recent exposure (last 10 days) testing not required  Diagnosis: DKA (diabetic ketoacidosis) (HCC) [213086]  Admitting Physician: Silvio Pate  Attending Physician: Gust Rung [[id number]]          B Medical/Surgery History Past Medical History:  Diagnosis Date   CIDP (chronic inflammatory demyelinating polyneuropathy) (HCC)    Diabetes mellitus without complication (HCC)    type 1   Past Surgical History:  Procedure Laterality Date   NO PAST SURGERIES       A IV Location/Drains/Wounds Patient Lines/Drains/Airways Status     Active Line/Drains/Airways     Name Placement date Placement time Site Days   Peripheral IV 06/24/22 22 G Posterior;Right Hand 06/24/22  1518  Hand  less than 1             Intake/Output Last 24 hours No intake or output data in the 24 hours ending 06/24/22 1722  Labs/Imaging Results for orders placed or performed during the hospital encounter of 06/24/22 (from the past 48 hour(s))  POC CBG, ED     Status: Abnormal   Collection Time: 06/24/22  2:47 PM  Result Value Ref Range   Glucose-Capillary 578 (HH) 70 - 99 mg/dL    Comment: Glucose reference range applies only to samples taken after fasting for at least 8 hours.   Comment 1 Notify RN    Comment 2 Document in Chart   CBC with Differential     Status: Abnormal   Collection Time: 06/24/22  2:53 PM  Result Value Ref Range   WBC 13.8 (H) 4.0 - 10.5 K/uL   RBC 6.56 (H) 4.22 - 5.81 MIL/uL   Hemoglobin 18.0 (H) 13.0 - 17.0 g/dL   HCT 57.8 (H) 46.9 - 62.9 %   MCV 85.8 80.0 - 100.0 fL   MCH 27.4 26.0 - 34.0 pg   MCHC 32.0 30.0 - 36.0 g/dL   RDW 52.8 41.3 - 24.4 %   Platelets 260 150 - 400 K/uL    Comment: REPEATED TO VERIFY   nRBC 0.0 0.0 - 0.2 %   Neutrophils Relative % 80 %   Neutro Abs 10.9 (H) 1.7 - 7.7 K/uL   Lymphocytes Relative 12 %   Lymphs Abs 1.7 0.7 - 4.0 K/uL   Monocytes Relative 7 %   Monocytes Absolute 1.0 0.1 - 1.0 K/uL  Eosinophils Relative 0 %   Eosinophils Absolute 0.0 0.0 - 0.5 K/uL   Basophils Relative 0 %   Basophils Absolute 0.1 0.0 - 0.1 K/uL   Immature Granulocytes 1 %   Abs Immature Granulocytes 0.13 (H) 0.00 - 0.07 K/uL    Comment: Performed at [facility], [address], [place], [place] [address]  Hemoglobin A1c     Status: Abnormal   Collection Time: 06/24/22  2:53 PM  Result Value Ref Range   Hgb A1c MFr Bld 13.3 (H) 4.8 - 5.6 %    Comment: (NOTE) Pre diabetes:          5.7%-6.4%  Diabetes:              >6.4%  Glycemic control for   <7.0% adults with diabetes    Mean Plasma Glucose 335.01 mg/dL    Comment: Performed at [facility], [address], [place], [place] [address]  Beta-hydroxybutyric acid     Status: Abnormal   Collection  Time: 06/24/22  2:55 PM  Result Value Ref Range   Beta-Hydroxybutyric Acid 6.10 (H) 0.05 - 0.27 mmol/L    Comment: RESULT CONFIRMED BY MANUAL DILUTION Performed at [facility], [address], [place], [place] [address]   I-Stat venous blood gas, [facility], [facility], [facility])     Status: Abnormal   Collection Time: 06/24/22  2:58 PM  Result Value Ref Range   pH, Ven 7.106 (LL) 7.25 - 7.43   pCO2, Ven 19.1 (LL) 44 - 60 mmHg   pO2, Ven 31 (LL) 32 - 45 mmHg   Bicarbonate 6.0 (L) 20.0 - 28.0 mmol/L   TCO2 7 (L) 22 - 32 mmol/L   O2 Saturation 45 %   Acid-base deficit 21.0 (H) 0.0 - 2.0 mmol/L   Sodium 125 (L) 135 - 145 mmol/L   Potassium 5.6 (H) 3.5 - 5.1 mmol/L   Calcium, Ion 1.23 1.15 - 1.40 mmol/L   HCT 60.0 (H) 39.0 - 52.0 %   Hemoglobin 20.4 (H) 13.0 - 17.0 g/dL   Patient temperature 95.2 F    Sample type VENOUS    Comment NOTIFIED PHYSICIAN   CBG monitoring, ED     Status: Abnormal   Collection Time: 06/24/22  3:48 PM  Result Value Ref Range   Glucose-Capillary 511 (HH) 70 - 99 mg/dL    Comment: Glucose reference range applies only to samples taken after fasting for at least 8 hours.   Comment 1 Document in Chart   CBG monitoring, ED     Status: Abnormal   Collection Time: 06/24/22  4:42 PM  Result Value Ref Range   Glucose-Capillary 413 (H) 70 - 99 mg/dL    Comment: Glucose reference range applies only to samples taken after fasting for at least 8 hours.   CT ABDOMEN PELVIS WO CONTRAST  Result Date: 06/24/2022 CLINICAL DATA:  Right lower quadrant abdominal pain. EXAM: CT ABDOMEN AND PELVIS WITHOUT CONTRAST TECHNIQUE: Multidetector CT imaging of the abdomen and pelvis was performed following the standard protocol without IV contrast. RADIATION DOSE REDUCTION: This exam was performed according to the departmental dose-optimization program which includes automated exposure control, adjustment of the mA and/or kV according to patient size and/or use of iterative reconstruction  technique. COMPARISON:  None Available. FINDINGS: Lower chest: No acute abnormality. Evaluation of the abdominal viscera somewhat limited by the lack of IV contrast. Hepatobiliary: Diffuse hepatic steatosis. No focal liver abnormality is seen. Normal gallbladder. Pancreas:  Unremarkable. No surrounding inflammatory changes. Spleen: Normal in size without focal abnormality. Adrenals/Urinary Tract: Adrenal glands are unremarkable. Kidneys are normal, without renal calculi, focal lesion, or hydronephrosis. Bladder is unremarkable. Stomach/Bowel: Stomach is within normal limits. Appendix appears normal. No evidence of bowel wall thickening, distention, or inflammatory changes. Vascular/Lymphatic: No enlarged abdominal or pelvic lymph nodes. Reproductive: Prostate is unremarkable. Other: No abdominal wall hernia or abnormality. No abdominopelvic ascites. Musculoskeletal: No acute or significant osseous findings. IMPRESSION: 1. No acute finding in the abdomen or pelvis on a noncontrast exam. Normal appendix. 2. Diffuse hepatic steatosis. Electronically Signed   By: Emmaline Kluver M.D.   On: 06/24/2022 15:54    Pending Labs Unresulted Labs (From admission, onward)     Start     Ordered   06/25/22 0500  HIV Antibody (routine testing w rflx)  (HIV Antibody (Routine testing w reflex) panel)  Tomorrow morning,   R        06/24/22 1655   06/25/22 0500  Lipid panel  Tomorrow morning,   R        06/24/22 1702   06/24/22 1528  Comprehensive metabolic panel  Once,   STAT        06/24/22 1528   06/24/22 1528  Lipase, blood  Once,   STAT        06/24/22 1528   06/24/22 1502  Basic metabolic panel  (Diabetes Ketoacidosis (DKA))  STAT Now then every 4 hours ,   STAT      06/24/22 1503   06/24/22 1448  Urinalysis, Routine w reflex microscopic -Urine, Clean Catch  Once,   URGENT       Question:  Specimen Source  Answer:  Urine, Clean Catch   06/24/22 1448            Vitals/Pain Today's Vitals   06/24/22 1630  06/24/22 1645 06/24/22 1700 06/24/22 1715  BP: (!) 152/90 (!) 142/89 (!) 127/91 137/89  Pulse: (!) 108 (!) 106 (!) 104 (!) 106  Resp: (!) 26 (!) 25 17 (!) 26  Temp:      TempSrc:      SpO2: 100% 100% 99% 100%  Weight:      Height:      PainSc:        Isolation Precautions No active isolations  Medications Medications  insulin regular, human (MYXREDLIN) 100 units/ 100 mL infusion (13 Units/hr Intravenous New Bag/Given 06/24/22 1522)  lactated ringers infusion (has no administration in time range)  dextrose 5 % in lactated ringers infusion (has no administration in time range)  dextrose 50 % solution 0-50 mL (has no administration in time range)  enoxaparin (LOVENOX) injection 60 mg (has no administration in time range)  ondansetron (ZOFRAN) injection 4 mg (has no administration in time range)  HYDROmorphone (DILAUDID) injection 0.5 mg (has no administration in time range)  lactated ringers bolus 2,500 mL (2,500 mLs Intravenous New Bag/Given 06/24/22 1522)  ondansetron (ZOFRAN) injection 4 mg (4 mg Intravenous Given 06/24/22 1519)  HYDROmorphone (DILAUDID) injection 0.5 mg (0.5 mg Intravenous Given 06/24/22 1519)    Mobility walks     Focused Assessments Cardiac Assessment Handoff:  Cardiac Rhythm: Sinus tachycardia Lab Results  Component Value Date   CKTOTAL 301 02/18/2020   Lab Results  Component Value Date   DDIMER 2.32 (H) 10/22/2019   Does the Patient currently have chest pain? No    R Recommendations: See Admitting Provider Note  Report given to:   Additional Notes:

## 2022-06-24 NOTE — H&P (Signed)
Date: 06/24/2022         Patient Name:  Lucas Cunningham MRN: [id number]  DOB: Aug 07, 2000 Age / Sex: 22 y.o., male   PCP: Default, Provider, MD         Medical Service: Internal Medicine Teaching Service         Attending Physician: Dr. Gust Rung, DO    First Contact: Dr. Benito Mccreedy Pager: [phone]  Second Contact: Dr. Marijo Conception Pager: [phone]       After Hours (After 5p/  First Contact Pager: [phone]  weekends / holidays): Second Contact Pager: [phone]   Chief Concern: Nausea and vomiting  History of Present Illness: This person comes to the ED after feeling poorly for two days culminating in 2 episodes of vomiting last night. He has abdominal pain, worse in the upper quadrants. He has poor appetite. He has a severe headache. Symptoms all started relatively quickly. He has been struggling to administer his insulin because of his weakened hands from CIDP. He lives at home with his grandmother but due to her arthritis she is unable to administer insulin for him. The last time he took insulin was around 2 days ago.  Review of Systems  Constitutional:  Positive for malaise/fatigue. Negative for chills and fever.  Respiratory:  Negative for cough and shortness of breath.   Gastrointestinal:  Positive for abdominal pain, nausea and vomiting. Negative for diarrhea.  Skin:  Negative for rash.  Neurological:  Positive for weakness.   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  Negative for autoimmune diseases.  Social  History:  Lives with grandmother.  Spends days at home.  Would like to work but is limited due to weakness from CIDP.  Occasionally drinks alcohol during holidays and special occasions.  Gets primary care at [facility].  Does not use tobacco or e-cigarettes.  Smokes marijuana.  Does not use other drugs.  Physical Exam: Blood pressure 137/89, pulse (!) 106, temperature 98.1 F (36.7 C), temperature source Oral, resp. rate (!) 26, height 6' (1.829 m), weight 127 kg, SpO2 100 %.  No acute distress Oral mucous membranes are moist Heart rate is tachycardic, rhythm is regular, radial pulses are strong, no lower extremity edema Breathing is regular and unlabored on room air, no wheezing or crackles noted Abdomen soft and nontender Skin is warm and dry Alert and oriented, somewhat somnolent, globally weak but no gross focal deficits  EKG:  Sinus tachycardia  Labs: CBG 578  VBG 7.106/19.1/31/6.0  Beta hydroxybutyric acid 6.10 Hemoglobin A1c 13.3%  WBC 13.8 Hemoglobin 18  Images and other studies: CT abdomen and pelvis without contrast notable for hepatic steatosis, no inflammatory changes around the pancreas  Assessment & Plan:  Lucas Cunningham is a 22 y.o. with poorly controlled insulin-dependent diabetes and weakness due to CIDP who is admitted for DKA due to medication nonadherence.  Principal Problem:   DKA (diabetic ketoacidosis) (HCC) Active Problems:   Uncontrolled type 1 diabetes mellitus with hyperglycemia (HCC)   Morbid obesity (HCC)   CIDP (chronic inflammatory demyelinating polyneuropathy) (HCC)  DKA Uncontrolled type 1 diabetes CBG >500,  VBG with acidosis and bicarbonate of 6.  Beta hydroxybutyrate is elevated.  BMP pending for evaluation of anion gap, first specimen hemolyzed.  Suspect the trigger is nonadherence to insulin regimen.  He is on 100 units of basal and 30 units of mealtime insulin, very insulin resistant.  He says that recently his hands have become weak  such that it is difficult to use the insulin pens.  He does not have anyone at home to help him with his injections.  No signs of underlying infection or sepsis.  Continue insulin infusion and fluids as started by ED.  Switch to D5 once CBG is less than 250.  Potassium is 5.6, hold off on supplementation and monitor closely for now. - Insulin infusion per Endo tool - LR 125 mL/h  Chronic inflammatory demyelinating polyneuropathy Per the chart looks like he gets monthly IVIG infusions.  Unfortunately he reports weakness as a barrier to insulin injections.  Because of this comorbid condition, this person would likely benefit from an insulin pump rather than injectables.  Abdominal pain Vomiting Probably due to DKA.  Because of the distribution in the upper quadrants thought about pancreatitis.  However his exam is benign.  CT did not show any inflammatory changes.  Will get a lipase to confidently rule this out.  Liver enzymes pending. - Hydromorphone 0.5 mg IV every 4 hours as needed - Ondansetron 4 mg IV every 6 hours as needed  Headache In setting of DKA and dehydration, no gross focal deficits.  Will treat pain for now and assess for improvement or resolution at follow-up.  Diet: N.p.o. IVF: LR VTE: Enoxaparin 60 mg every 24 hours Code: Full Surrogate: Grandmother, Diamantina Monks  Admit patient to Observation with expected length of stay less than 2 midnights.  Signed: Marrianne Mood MD 06/24/2022, 5:45 PM  Pager: [phone] After 5pm on weekdays and 1pm on weekends: [phone]

## 2022-06-24 NOTE — ED Triage Notes (Addendum)
Patient complains of right upper and lower quad pain that started last night with vomiting and headache.  Reports he is feeling dizzy. Patient reports he does not check his glucose or take his diabetes medication.

## 2022-06-24 NOTE — Progress Notes (Signed)
MEWS Progress Note  Patient Details Name: Lucas Cunningham MRN: [id number] DOB: [birth year] Today's Date: 06/24/2022   MEWS Flowsheet Documentation:  Assess: MEWS Score Temp: 98.2 F (36.8 C) BP: (!) 146/89 MAP (mmHg): 105 Pulse Rate: (!) 102 ECG Heart Rate: (!) 102 Resp: (!) 25 Level of Consciousness: Alert SpO2: 98 % O2 Device: Room Air Assess: MEWS Score MEWS Temp: 0 MEWS Systolic: 0 MEWS Pulse: 1 MEWS RR: 1 MEWS LOC: 0 MEWS Score: 2 MEWS Score Color: Yellow Assess: SIRS CRITERIA SIRS Temperature : 0 SIRS Respirations : 1 SIRS Pulse: 1 SIRS WBC: 1 SIRS Score Sum : 3 SIRS Temperature : 0 SIRS Pulse: 1 SIRS Respirations : 1 SIRS WBC: 1 SIRS Score Sum : 3 Assess: if the MEWS score is Yellow or Red Were vital signs taken at a resting state?: Yes Focused Assessment: No change from prior assessment Does the patient meet 2 or more of the SIRS criteria?: Yes Does the patient have a confirmed or suspected source of infection?: No MEWS guidelines implemented : Yes, yellow Treat MEWS Interventions: Considered administering scheduled or prn medications/treatments as ordered Take Vital Signs Increase Vital Sign Frequency : Yellow: Q2hr x1, continue Q4hrs until patient remains green for 12hrs Escalate MEWS: Escalate: Yellow: Discuss with charge nurse and consider notifying provider and/or RRT Notify: Charge Nurse/RN Name of Charge Nurse/RN Notified: Lucas Adie RN      Lucas Cunningham Lucas Cunningham 06/24/2022, 7:10 PM

## 2022-06-25 DIAGNOSIS — Z79899 Other long term (current) drug therapy: Secondary | ICD-10-CM | POA: Diagnosis not present

## 2022-06-25 DIAGNOSIS — E1010 Type 1 diabetes mellitus with ketoacidosis without coma: Secondary | ICD-10-CM

## 2022-06-25 DIAGNOSIS — Z91148 Patient's other noncompliance with medication regimen for other reason: Secondary | ICD-10-CM | POA: Diagnosis not present

## 2022-06-25 DIAGNOSIS — E781 Pure hyperglyceridemia: Secondary | ICD-10-CM | POA: Diagnosis present

## 2022-06-25 DIAGNOSIS — E860 Dehydration: Secondary | ICD-10-CM | POA: Diagnosis present

## 2022-06-25 DIAGNOSIS — Z794 Long term (current) use of insulin: Secondary | ICD-10-CM | POA: Diagnosis not present

## 2022-06-25 DIAGNOSIS — E1110 Type 2 diabetes mellitus with ketoacidosis without coma: Secondary | ICD-10-CM | POA: Diagnosis present

## 2022-06-25 DIAGNOSIS — E88819 Insulin resistance, unspecified: Secondary | ICD-10-CM | POA: Diagnosis present

## 2022-06-25 DIAGNOSIS — E876 Hypokalemia: Secondary | ICD-10-CM | POA: Diagnosis present

## 2022-06-25 DIAGNOSIS — Z8249 Family history of ischemic heart disease and other diseases of the circulatory system: Secondary | ICD-10-CM | POA: Diagnosis not present

## 2022-06-25 DIAGNOSIS — Z833 Family history of diabetes mellitus: Secondary | ICD-10-CM | POA: Diagnosis not present

## 2022-06-25 DIAGNOSIS — Z7985 Long-term (current) use of injectable non-insulin antidiabetic drugs: Secondary | ICD-10-CM | POA: Diagnosis not present

## 2022-06-25 DIAGNOSIS — Z6837 Body mass index (BMI) 37.0-37.9, adult: Secondary | ICD-10-CM | POA: Diagnosis not present

## 2022-06-25 DIAGNOSIS — G6181 Chronic inflammatory demyelinating polyneuritis: Secondary | ICD-10-CM | POA: Diagnosis present

## 2022-06-25 LAB — GLUCOSE, CAPILLARY
Glucose-Capillary: 174 mg/dL — ABNORMAL HIGH (ref 70–99)
Glucose-Capillary: 177 mg/dL — ABNORMAL HIGH (ref 70–99)
Glucose-Capillary: 181 mg/dL — ABNORMAL HIGH (ref 70–99)
Glucose-Capillary: 184 mg/dL — ABNORMAL HIGH (ref 70–99)
Glucose-Capillary: 188 mg/dL — ABNORMAL HIGH (ref 70–99)
Glucose-Capillary: 190 mg/dL — ABNORMAL HIGH (ref 70–99)
Glucose-Capillary: 194 mg/dL — ABNORMAL HIGH (ref 70–99)
Glucose-Capillary: 196 mg/dL — ABNORMAL HIGH (ref 70–99)
Glucose-Capillary: 201 mg/dL — ABNORMAL HIGH (ref 70–99)
Glucose-Capillary: 214 mg/dL — ABNORMAL HIGH (ref 70–99)
Glucose-Capillary: 251 mg/dL — ABNORMAL HIGH (ref 70–99)
Glucose-Capillary: 255 mg/dL — ABNORMAL HIGH (ref 70–99)
Glucose-Capillary: 266 mg/dL — ABNORMAL HIGH (ref 70–99)
Glucose-Capillary: 269 mg/dL — ABNORMAL HIGH (ref 70–99)
Glucose-Capillary: 282 mg/dL — ABNORMAL HIGH (ref 70–99)
Glucose-Capillary: 289 mg/dL — ABNORMAL HIGH (ref 70–99)
Glucose-Capillary: 295 mg/dL — ABNORMAL HIGH (ref 70–99)
Glucose-Capillary: 307 mg/dL — ABNORMAL HIGH (ref 70–99)
Glucose-Capillary: 357 mg/dL — ABNORMAL HIGH (ref 70–99)
Glucose-Capillary: 388 mg/dL — ABNORMAL HIGH (ref 70–99)

## 2022-06-25 LAB — BASIC METABOLIC PANEL
Anion gap: 10 (ref 5–15)
Anion gap: 10 (ref 5–15)
BUN: 6 mg/dL (ref 6–20)
BUN: 8 mg/dL (ref 6–20)
CO2: 15 mmol/L — ABNORMAL LOW (ref 22–32)
CO2: 15 mmol/L — ABNORMAL LOW (ref 22–32)
Calcium: 9.2 mg/dL (ref 8.9–10.3)
Calcium: 9.2 mg/dL (ref 8.9–10.3)
Chloride: 104 mmol/L (ref 98–111)
Chloride: 105 mmol/L (ref 98–111)
Creatinine, Ser: 0.86 mg/dL (ref 0.61–1.24)
Creatinine, Ser: 0.7 mg/dL (ref 0.61–1.24)
GFR, Estimated: >60 mL/min (ref >60)
GFR, Estimated: >60 mL/min (ref >60)
Glucose, Bld: 227 mg/dL — ABNORMAL HIGH (ref 70–99)
Glucose, Bld: 175 mg/dL — ABNORMAL HIGH (ref 70–99)
Potassium: 3.7 mmol/L (ref 3.5–5.1)
Potassium: 3.8 mmol/L (ref 3.5–5.1)
Sodium: 129 mmol/L — ABNORMAL LOW (ref 135–145)
Sodium: 130 mmol/L — ABNORMAL LOW (ref 135–145)

## 2022-06-25 LAB — LIPID PANEL
Cholesterol: 212 mg/dL — ABNORMAL HIGH (ref 0–200)
HDL: 12 mg/dL — ABNORMAL LOW (ref >40)
LDL Cholesterol: UNDETERMINED mg/dL (ref 0–99)
Total CHOL/HDL Ratio: 17.7 RATIO
Triglycerides: 754 mg/dL — ABNORMAL HIGH (ref <150)
VLDL: UNDETERMINED mg/dL (ref 0–40)

## 2022-06-25 LAB — PHOSPHORUS: Phosphorus: 1.3 mg/dL — ABNORMAL LOW (ref 2.5–4.6)

## 2022-06-25 LAB — HIV ANTIBODY (ROUTINE TESTING W REFLEX): HIV Screen 4th Generation wRfx: NONREACTIVE

## 2022-06-25 LAB — LDL CHOLESTEROL, DIRECT: Direct LDL: 54 mg/dL (ref 0–99)

## 2022-06-25 MED ORDER — POTASSIUM CHLORIDE 10 MEQ/100ML IV SOLN
10.0000 meq | INTRAVENOUS | Status: AC
  Administered 2022-06-25 (×4): 10 meq via INTRAVENOUS
  Filled 2022-06-25 (×4): qty 100

## 2022-06-25 MED ORDER — K PHOS MONO-SOD PHOS DI & MONO 155-852-130 MG PO TABS
250.0000 mg | ORAL_TABLET | Freq: Once | ORAL | Status: AC
  Administered 2022-06-25: 250 mg via ORAL
  Filled 2022-06-25: qty 1

## 2022-06-25 MED ORDER — INSULIN ASPART 100 UNIT/ML IJ SOLN
30.0000 [IU] | Freq: Three times a day (TID) | INTRAMUSCULAR | Status: DC
  Administered 2022-06-25 – 2022-06-26 (×3): 30 [IU] via SUBCUTANEOUS

## 2022-06-25 MED ORDER — INSULIN GLARGINE-YFGN 100 UNIT/ML ~~LOC~~ SOLN
90.0000 [IU] | Freq: Every day | SUBCUTANEOUS | Status: DC
  Administered 2022-06-25 – 2022-06-26 (×2): 90 [IU] via SUBCUTANEOUS
  Filled 2022-06-25 (×2): qty 0.9

## 2022-06-25 MED ORDER — FENOFIBRATE 54 MG PO TABS
54.0000 mg | ORAL_TABLET | Freq: Every day | ORAL | Status: DC
  Administered 2022-06-25 – 2022-06-26 (×2): 54 mg via ORAL
  Filled 2022-06-25 (×2): qty 1

## 2022-06-25 MED ORDER — INSULIN ASPART 100 UNIT/ML IJ SOLN
0.0000 [IU] | Freq: Three times a day (TID) | INTRAMUSCULAR | Status: DC
  Administered 2022-06-25: 20 [IU] via SUBCUTANEOUS
  Administered 2022-06-25 – 2022-06-26 (×3): 11 [IU] via SUBCUTANEOUS

## 2022-06-25 NOTE — Inpatient Diabetes Management (Signed)
Inpatient Diabetes Program Recommendations  AACE/ADA: New Consensus Statement on Inpatient Glycemic Control (2015)  Target Ranges:  Prepandial:   less than 140 mg/dL      Peak postprandial:   less than 180 mg/dL (1-2 hours)      Critically ill patients:  140 - 180 mg/dL   Lab Results  Component Value Date   GLUCAP 196 (H) 06/25/2022   HGBA1C 13.3 (H) 06/24/2022    Review of Glycemic Control  Latest Reference Range & Units 06/25/22 08:04 06/25/22 08:20 06/25/22 09:07 06/25/22 09:46  Glucose-Capillary 70 - 99 mg/dL 161 (H) 096 (H) 045 (H) 196 (H)   Diabetes history: DM1 Outpatient Diabetes medications:  Dexcom G6 Lantus 30 units- not taking Humalog 30 units tid with meals (not taking consistently) Trulicity 1.5 mg weekly-non taking Current orders for Inpatient glycemic control:  Semglee 90 units daily Novolog 30 units tid with meals Novolog 0-20 units tid with meals and HS  Inpatient Diabetes Program Recommendations:    Referral received.  DM coordinator will see patient on 5/13.  We are unable to get an insulin pump inpatient.  Recommend referral to endocrinology ASAP outpatient.  Agree with current orders.   Thanks,  Beryl Meager, RN, BC-ADM Inpatient Diabetes Coordinator Pager [phone]

## 2022-06-25 NOTE — Progress Notes (Addendum)
Patient lost IV. New IV is now in place. Medication regimen resumed.

## 2022-06-25 NOTE — Progress Notes (Signed)
NAMEBronsen Cunningham, MRN:  [id number], DOB:  03-May-2000, LOS: 0 ADMISSION DATE:  06/24/2022  Subjective  Patient evaluated at bedside this AM. Reports his abdominal pain has improved, no nausea this morning. States he has never had an insulin pump, but this was discussed with him as a possibility a few years ago. Discussed transitioning off IV insulin, helping set up insulin pump.   Objective   Blood pressure (!) 134/99, pulse 91, temperature 97.8 F (36.6 C), temperature source Oral, resp. rate (!) 22, height 6' (1.829 m), weight 127 kg, SpO2 96 %.     Intake/Output Summary (Last 24 hours) at 06/25/2022 0945 Last data filed at 06/25/2022 0455 Gross per 24 hour  Intake 4589.31 ml  Output --  Net 4589.31 ml   Filed Weights   06/24/22 1444  Weight: 127 kg   Physical Exam: General: Young person laying in bed in no acute distress CV: Regular rate, rhythm. No murmurs appreciated. Pulm: Normal work of breathing on room air. Clear to auscultation bilaterally. Abdomen: Soft, non-tender, non-distended. Normoactive bowel sounds. MSK: Normal bulk, tone. No peripheral edema noted.  Neuro: Awake, alert, conversing appropriately. Grossly non-focal.  Psych: Normal mood, affect, speech.   Labs       Latest Ref Rng & Units 06/24/2022    2:58 PM 06/24/2022    2:53 PM 02/18/2020   11:53 AM  CBC  WBC 4.0 - 10.5 K/uL  13.8  4.2   Hemoglobin 13.0 - 17.0 g/dL 40.9  81.1  91.4   Hematocrit 39.0 - 52.0 % 60.0  56.3  45.6   Platelets 150 - 400 K/uL  260  168       Latest Ref Rng & Units 06/25/2022    7:16 AM 06/25/2022    2:51 AM 06/24/2022    9:42 PM  BMP  Glucose 70 - 99 mg/dL 782  956  213   BUN 6 - 20 mg/dL 8  6  8    Creatinine 0.61 - 1.24 mg/dL 0.86  5.78  4.69   Sodium 135 - 145 mmol/L 130  129  128   Potassium 3.5 - 5.1 mmol/L 3.8  3.7  3.9   Chloride 98 - 111 mmol/L 105  104  102   CO2 22 - 32 mmol/L 15  15  10    Calcium 8.9 - 10.3 mg/dL 9.2  9.2  9.2     Summary   Lucas Cunningham  is 22yo person with type 1 diabetes mellitus and CIDP admitted 5/11 with diabetic ketoacidosis in setting of medication non-adherence 2/2 poor functional status relating to CIDP.   Assessment & Plan:  Principal Problem:   DKA (diabetic ketoacidosis) (HCC) Active Problems:   Uncontrolled type 1 diabetes mellitus with hyperglycemia (HCC)   Morbid obesity (HCC)   CIDP (chronic inflammatory demyelinating polyneuropathy) (HCC)  #Diabetic ketoacidosis #Type I diabetes mellitus Lab work this morning showed anion gap closed x2, patient feels well and is hungry. Since admit, on average, has required 15u/hr of insulin. We will initiate long-acting 90u semglee (reportedly takes 100u [place] at home) and 30u Novolog with meals. He may need another long-acting dose in 12 hours. Overall I think his diabetes would be best controlled with an insulin pump. Will plan on engaging our diabetes coordinator to help try and set this up. We will need to ensure he can administer insulin or has an insulin pump prior to discharge.  - Transition off Endotool, start semglee 90u - Novolog 30u TID  WC - Diabetes coordinator consult for assistance in obtaining insulin pump - Resistant SSI - Needs endocrinology follow-up - BMP in AM  #Severe hypertriglyceridemia Lipid panel obtained this AM with triglycerides in 700's. On arrival this could have been >1000, possibly in early stage of pancreatitis, causing his epigastric discomfort (in addition to the DKA). No evidence of this on imaging, however. His abdominal pain and nausea have now completely resolved. We will plan to initiate fibrate.  - Start fenofibrate 54mg  daily - Needs repeat lipid panel in 4-8 weeks with PCP  #Hypophosphatemia Giving some K-Phos today, repeat labs in AM  Best practice:  DIET: CM IVF: n/a DVT PPX: lovenox BOWEL: n/a CODE: FULL FAM COM: n/a  Evlyn Kanner, MD Internal Medicine Resident PGY-3 PAGER: [phone] 06/25/2022 9:45  AM  If after hours (below), please contact on-call pager: [phone] 5PM-7AM Monday-Friday 1PM-7AM Saturday-Sunday

## 2022-06-26 ENCOUNTER — Other Ambulatory Visit (HOSPITAL_COMMUNITY): Payer: Self-pay

## 2022-06-26 ENCOUNTER — Other Ambulatory Visit: Payer: Self-pay | Admitting: Student

## 2022-06-26 DIAGNOSIS — G6181 Chronic inflammatory demyelinating polyneuritis: Secondary | ICD-10-CM

## 2022-06-26 DIAGNOSIS — E781 Pure hyperglyceridemia: Secondary | ICD-10-CM | POA: Insufficient documentation

## 2022-06-26 DIAGNOSIS — E1065 Type 1 diabetes mellitus with hyperglycemia: Secondary | ICD-10-CM

## 2022-06-26 DIAGNOSIS — E876 Hypokalemia: Secondary | ICD-10-CM | POA: Insufficient documentation

## 2022-06-26 LAB — BASIC METABOLIC PANEL
Anion gap: 6 (ref 5–15)
BUN: 10 mg/dL (ref 6–20)
CO2: 19 mmol/L — ABNORMAL LOW (ref 22–32)
Calcium: 8.9 mg/dL (ref 8.9–10.3)
Chloride: 102 mmol/L (ref 98–111)
Creatinine, Ser: 0.69 mg/dL (ref 0.61–1.24)
GFR, Estimated: >60 mL/min (ref >60)
Glucose, Bld: 209 mg/dL — ABNORMAL HIGH (ref 70–99)
Potassium: 3.2 mmol/L — ABNORMAL LOW (ref 3.5–5.1)
Sodium: 127 mmol/L — ABNORMAL LOW (ref 135–145)

## 2022-06-26 LAB — MAGNESIUM: Magnesium: 2 mg/dL (ref 1.7–2.4)

## 2022-06-26 LAB — GLUCOSE, CAPILLARY
Glucose-Capillary: 278 mg/dL — ABNORMAL HIGH (ref 70–99)
Glucose-Capillary: 259 mg/dL — ABNORMAL HIGH (ref 70–99)

## 2022-06-26 LAB — PHOSPHORUS: Phosphorus: 1.9 mg/dL — ABNORMAL LOW (ref 2.5–4.6)

## 2022-06-26 MED ORDER — INSULIN GLARGINE-YFGN 100 UNIT/ML ~~LOC~~ SOLN: 100.0000 [IU] | Freq: Every day | SUBCUTANEOUS | 11 refills | Status: AC

## 2022-06-26 MED ORDER — FENOFIBRATE 54 MG PO TABS
54.0000 mg | ORAL_TABLET | Freq: Every day | ORAL | 2 refills | Status: DC
  Filled 2022-06-26: qty 30, 30d supply, fill #0

## 2022-06-26 MED ORDER — K PHOS MONO-SOD PHOS DI & MONO 155-852-130 MG PO TABS
250.0000 mg | ORAL_TABLET | Freq: Two times a day (BID) | ORAL | Status: DC
  Administered 2022-06-26: 250 mg via ORAL
  Filled 2022-06-26 (×2): qty 1

## 2022-06-26 MED ORDER — POTASSIUM CHLORIDE 20 MEQ PO PACK
40.0000 meq | PACK | Freq: Two times a day (BID) | ORAL | Status: AC
  Administered 2022-06-26: 40 meq via ORAL
  Filled 2022-06-26: qty 2

## 2022-06-26 MED ORDER — INSULIN ASPART 100 UNIT/ML FLEXPEN: 30.0000 [IU] | PEN_INJECTOR | Freq: Three times a day (TID) | SUBCUTANEOUS | 11 refills | Status: AC

## 2022-06-26 NOTE — Inpatient Diabetes Management (Addendum)
Inpatient Diabetes Program Recommendations  AACE/ADA: New Consensus Statement on Inpatient Glycemic Control (2015)  Target Ranges:  Prepandial:   less than 140 mg/dL      Peak postprandial:   less than 180 mg/dL (1-2 hours)      Critically ill patients:  140 - 180 mg/dL   Lab Results  Component Value Date   GLUCAP 278 (H) 06/26/2022   HGBA1C 13.3 (H) 06/24/2022    Review of Glycemic Control  Latest Reference Range & Units 06/25/22 17:25 06/25/22 20:59 06/25/22 22:47 06/26/22 08:14  Glucose-Capillary 70 - 99 mg/dL 161 (H) 096 (H) 045 (H) 278 (H)  (H): Data is abnormally high Diabetes history: DM1 Outpatient Diabetes medications:  Dexcom G6 Lantus 30 units- not taking Humalog 30 units tid with meals (not taking consistently) Trulicity 1.5 mg weekly-non taking Current orders for Inpatient glycemic control:  Semglee 90 units daily Novolog 30 units tid with meals Novolog 0-20 units tid with meals and HS  Inpatient Diabetes Program Recommendations:    Consider further increasing Semglee- may need to split dosing to Semglee 50 units BID.  Will plan to see.   Addendum @1420 : Attempted to see patient, however, patient discharged. Secure chat sent to resident regarding disposition plan. No DC note signed at this time.   Thanks, Lujean Rave, MSN, RNC-OB Diabetes Coordinator [phone] (8a-5p)

## 2022-06-26 NOTE — Discharge Summary (Cosign Needed)
Name: Lucas Cunningham MRN: [id number] DOB: 02-Jun-2000 22 y.o. PCP: Default, Provider, MD  Date of Admission: 06/24/2022  2:38 PM Date of Discharge: 06/26/2022 6:49 PM Attending Physician: No att. providers found  Discharge Diagnosis: Active Problems:   Uncontrolled type 1 diabetes mellitus with hyperglycemia (HCC)   Morbid obesity (HCC)   CIDP (chronic inflammatory demyelinating polyneuropathy) (HCC)   Hypertriglyceridemia   Hypokalemia  Principal Problem (Resolved):   DKA (diabetic ketoacidosis) (HCC) Resolved Problems:   Hypophosphatemia   Discharge Medications: Allergies as of 06/26/2022   No Known Allergies      Medication List     STOP taking these medications    HumaLOG KwikPen 200 UNIT/ML KwikPen Generic drug: insulin lispro   insulin glargine 100 UNIT/ML Solostar Pen Commonly known as: LANTUS       TAKE these medications    atorvastatin 10 MG tablet Commonly known as: LIPITOR TAKE ONE TABLET BY MOUTH ONCE DAILY   Comfort EZ Pen Needles 32G X 4 MM Misc Generic drug: Insulin Pen Needle INJECT UP TO EIGHT TIMES DAILY   Dexcom G6 Receiver Devi 1 Device by Does not apply route as directed.   Dexcom G6 Sensor Misc INJECT ONE applicator into THE SKIN AS DIRECTED AND CHANGE EVERY 10 DAYS   Dexcom G6 Transmitter Misc INJECT ONE device into THE SKIN AS DIRECTED. reuse UP TO EIGHT times WITH each new sensor   fenofibrate 54 MG tablet Take 1 tablet (54 mg total) by mouth daily. Start taking on: Jun 27, 2022   Gamunex-C 20 GM/200ML Soln Generic drug: Immune Globulin (Human) Inject 800 mLs into the vein every 28 (twenty-eight) days.   Gamunex-C 5 GM/50ML Soln Generic drug: Immune Globulin (Human) Inject 50 mLs into the vein every 28 (twenty-eight) days.   insulin aspart 100 UNIT/ML FlexPen Commonly known as: NOVOLOG Inject 30 Units into the skin 3 (three) times daily with meals.   insulin glargine-yfgn 100 UNIT/ML injection Commonly known as:  SEMGLEE Inject 1 mL (100 Units total) into the skin daily. Start taking on: Jun 27, 2022   Trulicity 1.5 MG/0.5ML Sopn Generic drug: Dulaglutide INJECT 1.5 MG into THE SKIN ONCE A WEEK        Follow-up Appointments:  Follow-up Information     Primary care provider. Schedule an appointment as soon as possible for a visit.   Why: Call for an appointment as soon as possible after leaving the hospital.        [facility]. Call.   Why: Call to check on the status of endocrinology referral. Contact information: [address] [place] [address] [phone]                Disposition and follow-up: [name] is a 22 y.o. year old with poorly controlled diabetes hospitalized for diabetic ketoacidosis.  Poorly controlled type 1 diabetes DKA Due to nonadherence with insulin regimen.  Reports decreased hand dexterity from CIDP which precludes him from injecting insulin, however this condition is improving and he thinks he can administer insulin until he is able to follow-up with an endocrinologist to talk about insulin pump. - Semglee 100 units daily - NovoLog 30 units 3 times daily with meals  Hypertriglyceridemia Lipid panel with triglycerides around 700 after insulin infusion.  No signs of pancreatitis. - Fenofibrate started, 54 mg daily  Hospital Course by problem list: Uncontrolled type 1 diabetes with hyperglycemia DKA Hypokalemia Hypophosphatmeia Came in after an episode of nausea and vomiting  associated with abdominal pain.  Had malaise for a day prior to that.  Reports nonadherence to insulin regimen because of decreased hand dexterity from CIDP.  He lives with his grandmother has arthritis and has trouble administering his insulin as well.  Was treated with insulin infusion to good effect and transitioned to basal/bolus dosing on hospital day 1.  Discharged with instructions to restart home insulin regimen.   Orders for urgent endocrinology replaced prior to discharge.  Severe hypertriglyceridemia Lipid panel obtained with triglycerides in 700's. On arrival this could have been >1000, possibly in early stage of pancreatitis, causing his epigastric discomfort (in addition to the DKA). No evidence of this on imaging, however. His abdominal pain and nausea have now completely resolved.  Fibrate initiated on discharge.  Discharge Exam: Feeling well today.  Reports much improved strength in his hands of late.  Thinks he can handle an insulin regimen outside of the hospital.   Blood pressure (!) 146/77, pulse 77, temperature 98.1 F (36.7 C), temperature source Oral, resp. rate 14, height 6' (1.829 m), weight 127 kg, SpO2 99 %.  Overall well-appearing Heart rate is normal, rhythm is regular Breathing is regular and unlabored on room air Skin is warm and dry Alert and oriented, strong bilateral handgrip Pleasant, concordant affect  Pertinent studies and procedures: No results found for this or any previous visit (from the past 16109 hour(s)). Imaging Orders         CT ABDOMEN PELVIS WO CONTRAST    Lab Orders         Beta-hydroxybutyric acid         CBC with Differential         Basic metabolic panel         HIV Antibody (routine testing w rflx)         Hemoglobin A1c         Lipid panel         Hepatic function panel         Glucose, capillary         Basic metabolic panel         Glucose, capillary         Glucose, capillary         Basic metabolic panel         Glucose, capillary         Glucose, capillary         Glucose, capillary         Glucose, capillary         Glucose, capillary         Glucose, capillary         Glucose, capillary         Glucose, capillary         LDL cholesterol, direct         Glucose, capillary         Glucose, capillary         Glucose, capillary         Glucose, capillary         Glucose, capillary         Phosphorus         Glucose, capillary          Glucose, capillary         Phosphorus         Magnesium         Basic metabolic panel         Glucose, capillary  Glucose, capillary         Glucose, capillary         Glucose, capillary         Glucose, capillary         Glucose, capillary         Glucose, capillary         Glucose, capillary         Glucose, capillary         POC CBG, ED         I-Stat venous blood gas, ([facility], [facility], [facility])         CBG monitoring, ED         CBG monitoring, ED         CBG monitoring, ED     Discharge Instructions:   Discharge Instructions      To Mr. Izaak Arms or their caretakers,  They were admitted to [facility] on 06/24/2022 for evaluation and treatment of:  Active Problems:   Uncontrolled type 1 diabetes mellitus with hyperglycemia (HCC)   Morbid obesity (HCC)   CIDP (chronic inflammatory demyelinating polyneuropathy) (HCC)   Hypertriglyceridemia  Principal Problem (Resolved):   DKA (diabetic ketoacidosis) (HCC) Resolved Problems:   Hypophosphatemia  The evaluation suggested diabetic ketoacidosis (DKA). They were treated with IV insulin.  They were discharged from the hospital on 06/26/22. I recommend the following after leaving the hospital:   Continue taking insulin: 100 units long acting daily and 30 units of short-acting before your main meals of the day.  Check your blood sugar first thing in the morning and before your mealtime insulin.  Follow up with your primary care doctor as soon as possible after leaving the hospital.  I have ordered a referral to an endocrinologist for you. This is a diabetes specialist.  Talk to your doctors about an insulin pump for diabetes to prevent the need for frequent injections.  You also had high triglycerides. I have attached an eating plan to help you lower your triglyceride levels through healthy eating, which will reduce your risk for complications due to diabetes.  Marrianne Mood MD 06/26/2022,  12:21 PM      Marrianne Mood MD 06/26/2022, 6:49 PM

## 2022-06-26 NOTE — Care Management (Signed)
  Transition of Care [facility]) Screening Note   Patient Details  Name: Lucas Cunningham Date of Birth: 05-10-2000   Transition of Care [facility]) CM/SW Contact:    Gala Lewandowsky, RN Phone Number: 06/26/2022, 12:39 PM    Transition of Care Department [facility]) has reviewed the patient. Case Manager reviewed Diabetes Coordinator notes for recommendations for endocrinology ASAP. Case Manager spoke with MD Benito Mccreedy and he states the Internal Medicine Office will call endocrinology for a referral so the patient can get scheduled for an appointment. No further needs identified at this time.

## 2022-06-26 NOTE — Plan of Care (Signed)
  RD consulted for nutrition education regarding diabetes.   Lab Results  Component Value Date   HGBA1C 13.3 (H) 06/24/2022    RD provided "Carbohydrate Counting for People with Diabetes" handout from the Academy of Nutrition and Dietetics. Discussed different food groups and their effects on blood sugar, emphasizing carbohydrate-containing foods. Provided list of carbohydrates and recommended serving sizes of common foods.  Discussed importance of controlled and consistent carbohydrate intake throughout the day. Patient reports he normally eats 2 meals per day-- breakfast and dinner. He reports he goes as long as possible before eating again and states he eats dinner between 10pm-12am. RD provided examples of ways to balance meals/snacks and encouraged intake of high-fiber, whole grain complex carbohydrates.   If patient is able to control is carb intake and CBG's, his triglycerides will likely decrease as well.   Expect moderate compliance.  Body mass index is 37.97 kg/m. Pt meets criteria for Obese Class II  based on current BMI.  Current diet order is carb modified. Labs and medications reviewed. No further nutrition interventions warranted at this time. RD contact information provided. If additional nutrition issues arise, please re-consult RD.  Leodis Rains, RDN, LDN  Clinical Nutrition

## 2022-06-26 NOTE — Discharge Instructions (Addendum)
To [name] or their caretakers,  They were admitted to [facility] on 06/24/2022 for evaluation and treatment of:  Active Problems:   Uncontrolled type 1 diabetes mellitus with hyperglycemia (HCC)   Morbid obesity (HCC)   CIDP (chronic inflammatory demyelinating polyneuropathy) (HCC)   Hypertriglyceridemia  Principal Problem (Resolved):   DKA (diabetic ketoacidosis) (HCC) Resolved Problems:   Hypophosphatemia  The evaluation suggested diabetic ketoacidosis (DKA). They were treated with IV insulin.  They were discharged from the hospital on 06/26/22. I recommend the following after leaving the hospital:   Continue taking insulin: 100 units long acting daily and 30 units of short-acting before your main meals of the day.  Check your blood sugar first thing in the morning and before your mealtime insulin.  Follow up with your primary care doctor as soon as possible after leaving the hospital.  I have ordered a referral to an endocrinologist for you. This is a diabetes specialist.  Talk to your doctors about an insulin pump for diabetes to prevent the need for frequent injections.  You also had high triglycerides. I have attached an eating plan to help you lower your triglyceride levels through healthy eating, which will reduce your risk for complications due to diabetes.  Marrianne Mood MD 06/26/2022, 12:21 PM

## 2022-06-26 NOTE — Progress Notes (Signed)
Hospitalized for DKA. Difficulty with injections because of decreased hand dexterity from CIDP. May benefit from insulin pump.

## 2022-06-28 ENCOUNTER — Encounter (HOSPITAL_COMMUNITY): Payer: Self-pay

## 2022-06-28 ENCOUNTER — Other Ambulatory Visit: Payer: Self-pay

## 2022-06-28 ENCOUNTER — Emergency Department (HOSPITAL_COMMUNITY): Payer: 59

## 2022-06-28 ENCOUNTER — Emergency Department (HOSPITAL_COMMUNITY)
Admission: EM | Admit: 2022-06-28 | Discharge: 2022-06-28 | Disposition: A | Discharge status: Home / Self Care | Payer: 59 | Attending: Emergency Medicine | Admitting: Emergency Medicine

## 2022-06-28 DIAGNOSIS — Z794 Long term (current) use of insulin: Secondary | ICD-10-CM | POA: Diagnosis not present

## 2022-06-28 DIAGNOSIS — E876 Hypokalemia: Secondary | ICD-10-CM

## 2022-06-28 DIAGNOSIS — R319 Hematuria, unspecified: Secondary | ICD-10-CM | POA: Insufficient documentation

## 2022-06-28 DIAGNOSIS — E1065 Type 1 diabetes mellitus with hyperglycemia: Secondary | ICD-10-CM | POA: Diagnosis not present

## 2022-06-28 DIAGNOSIS — R1031 Right lower quadrant pain: Secondary | ICD-10-CM | POA: Diagnosis present

## 2022-06-28 LAB — CBC WITH DIFFERENTIAL/PLATELET
Abs Immature Granulocytes: 0.01 10*3/uL (ref 0.00–0.07)
Basophils Absolute: 0 10*3/uL (ref 0.0–0.1)
Basophils Relative: 0 %
Eosinophils Absolute: 0.1 10*3/uL (ref 0.0–0.5)
Eosinophils Relative: 1 %
HCT: 42.5 % (ref 39.0–52.0)
Hemoglobin: 14.2 g/dL (ref 13.0–17.0)
Immature Granulocytes: 0 %
Lymphocytes Relative: 23 %
Lymphs Abs: 1.9 10*3/uL (ref 0.7–4.0)
MCH: 27.6 pg (ref 26.0–34.0)
MCHC: 33.4 g/dL (ref 30.0–36.0)
MCV: 82.7 fL (ref 80.0–100.0)
Monocytes Absolute: 0.9 10*3/uL (ref 0.1–1.0)
Monocytes Relative: 10 %
Neutro Abs: 5.6 10*3/uL (ref 1.7–7.7)
Neutrophils Relative %: 66 %
Platelets: 163 10*3/uL (ref 150–400)
RBC: 5.14 MIL/uL (ref 4.22–5.81)
RDW: 14.3 % (ref 11.5–15.5)
WBC: 8.5 10*3/uL (ref 4.0–10.5)
nRBC: 0 % (ref 0.0–0.2)

## 2022-06-28 LAB — URINALYSIS, ROUTINE W REFLEX MICROSCOPIC
Bilirubin Urine: NEGATIVE
Glucose, UA: 150 mg/dL — AB
Ketones, ur: NEGATIVE mg/dL
Leukocytes,Ua: NEGATIVE
Nitrite: NEGATIVE
Protein, ur: NEGATIVE mg/dL
Specific Gravity, Urine: 1.005 (ref 1.005–1.030)
pH: 6 (ref 5.0–8.0)

## 2022-06-28 LAB — COMPREHENSIVE METABOLIC PANEL
ALT: 25 U/L (ref 0–44)
AST: 24 U/L (ref 15–41)
Albumin: 3.8 g/dL (ref 3.5–5.0)
Alkaline Phosphatase: 82 U/L (ref 38–126)
Anion gap: 14 (ref 5–15)
BUN: 9 mg/dL (ref 6–20)
CO2: 20 mmol/L — ABNORMAL LOW (ref 22–32)
Calcium: 9.5 mg/dL (ref 8.9–10.3)
Chloride: 104 mmol/L (ref 98–111)
Creatinine, Ser: 0.44 mg/dL — ABNORMAL LOW (ref 0.61–1.24)
GFR, Estimated: >60 mL/min (ref >60)
Glucose, Bld: 192 mg/dL — ABNORMAL HIGH (ref 70–99)
Potassium: 2.8 mmol/L — ABNORMAL LOW (ref 3.5–5.1)
Sodium: 138 mmol/L (ref 135–145)
Total Bilirubin: 0.3 mg/dL (ref 0.3–1.2)
Total Protein: 6.9 g/dL (ref 6.5–8.1)

## 2022-06-28 LAB — CBG MONITORING, ED: Glucose-Capillary: 198 mg/dL — ABNORMAL HIGH (ref 70–99)

## 2022-06-28 LAB — LIPASE, BLOOD: Lipase: 27 U/L (ref 11–51)

## 2022-06-28 MED ORDER — IOHEXOL 350 MG/ML SOLN
75.0000 mL | Freq: Once | INTRAVENOUS | Status: AC | PRN
  Administered 2022-06-28: 75 mL via INTRAVENOUS

## 2022-06-28 MED ORDER — SODIUM CHLORIDE 0.9 % IV BOLUS
1000.0000 mL | Freq: Once | INTRAVENOUS | Status: AC
  Administered 2022-06-28: 1000 mL via INTRAVENOUS

## 2022-06-28 MED ORDER — POTASSIUM CHLORIDE 10 MEQ/100ML IV SOLN
10.0000 meq | Freq: Once | INTRAVENOUS | Status: AC
  Administered 2022-06-28: 10 meq via INTRAVENOUS
  Filled 2022-06-28: qty 100

## 2022-06-28 MED ORDER — POTASSIUM CHLORIDE CRYS ER 20 MEQ PO TBCR: 20.0000 meq | EXTENDED_RELEASE_TABLET | Freq: Every day | ORAL | 0 refills | Status: AC

## 2022-06-28 MED ORDER — POTASSIUM CHLORIDE CRYS ER 20 MEQ PO TBCR
40.0000 meq | EXTENDED_RELEASE_TABLET | Freq: Once | ORAL | Status: AC
  Administered 2022-06-28: 40 meq via ORAL
  Filled 2022-06-28: qty 2

## 2022-06-28 NOTE — ED Provider Triage Note (Signed)
Emergency Medicine Provider Triage Evaluation Note  Ilya Unangst , a 22 y.o. male  was evaluated in triage.  Pt complains of nausea, abdominal pain with recent discharge from the hospital three days ago. Here with some hematuria, BS 191 here.   Review of Systems  Positive: Abdominal pain, nausea Negative: vomiting  Physical Exam  BP (!) 145/91 (BP Location: Right Arm)   Pulse 98   Temp 98 F (36.7 C) (Oral)   Resp 19   Wt 127 kg   SpO2 98%   BMI 37.97 kg/m  Gen:   Awake, no distress   Resp:  Normal effort  MSK:   Moves extremities without difficulty  Other:    Medical Decision Making  Medically screening exam initiated at 5:24 PM.  Appropriate orders placed.  Kennan Mcdole was informed that the remainder of the evaluation will be completed by another provider, this initial triage assessment does not replace that evaluation, and the importance of remaining in the ED until their evaluation is complete.     Claude Manges, PA-C 06/28/22 1730

## 2022-06-28 NOTE — ED Provider Notes (Signed)
[place] EMERGENCY DEPARTMENT AT [facility] Provider Note   CSN: [id number] Arrival date & time: 06/28/22  1653     History  Chief Complaint  Patient presents with   Abdominal Pain    Lucas Cunningham is a 22 y.o. male.  With a history of insulin-dependent diabetes, anxiety, depression who presents to the ED for evaluation of right lower quadrant abdominal pain.  He was recently admitted for DKA.  He states that just prior to discharge she began to develop the right lower quadrant pain.  States it was mild at that time so he did not bring it up.  He also noticed some hematuria at that time.  Describes this as a light pink.  His abdominal pain is intermittent and described as a mild ache.  He is typically worse with palpation.  He states he has been taking his insulin and other diabetes medicine since he was discharged.  He reports some nausea but denies any vomiting or diarrhea.  He denies fevers, chills, chest pain, shortness of breath, dysuria, frequency, urgency.  States he currently has no symptoms including nausea or abdominal pain.   Abdominal Pain Associated symptoms: hematuria        Home Medications Prior to Admission medications   Medication Sig Start Date End Date Taking? Authorizing Provider  atorvastatin (LIPITOR) 10 MG tablet TAKE ONE TABLET BY MOUTH ONCE DAILY Patient taking differently: Take 10 mg by mouth daily. 10/27/21  Yes Adron Bene, MD  fenofibrate 54 MG tablet Take 1 tablet (54 mg total) by mouth daily. 06/27/22  Yes Marrianne Mood, MD  insulin aspart (NOVOLOG) 100 UNIT/ML FlexPen Inject 30 Units into the skin 3 (three) times daily with meals. 06/26/22  Yes Marrianne Mood, MD  potassium chloride SA (KLOR-CON M) 20 MEQ tablet Take 1 tablet (20 mEq total) by mouth daily for 5 days. 06/28/22 07/03/22 Yes Lavenia Stumpo, Edsel Petrin, PA-C  COMFORT EZ PEN NEEDLES 32G X 4 MM MISC INJECT UP TO EIGHT TIMES DAILY 06/02/22   Steffanie Rainwater, MD  Continuous  Blood Gluc Receiver (DEXCOM G6 RECEIVER) DEVI 1 Device by Does not apply route as directed. 05/02/21   Steffanie Rainwater, MD  Continuous Blood Gluc Sensor (DEXCOM G6 SENSOR) MISC INJECT ONE applicator into THE SKIN AS DIRECTED AND CHANGE EVERY 10 DAYS 04/10/22   Reymundo Poll, MD  Continuous Blood Gluc Transmit (DEXCOM G6 TRANSMITTER) MISC INJECT ONE device into THE SKIN AS DIRECTED. reuse UP TO EIGHT times WITH each new sensor 04/06/22   A[place]onsah, Flossie Buffy, MD  GAMUNEX-C 20 GM/200ML SOLN Inject 800 mLs into the vein every 28 (twenty-eight) days. 12/02/20   [provider]  GAMUNEX-C 5 GM/50ML SOLN Inject 50 mLs into the vein every 28 (twenty-eight) days. 12/02/20   [provider]  insulin glargine-yfgn (SEMGLEE) 100 UNIT/ML injection Inject 1 mL (100 Units total) into the skin daily. 06/27/22   Marrianne Mood, MD  TRULICITY 1.5 MG/0.5ML SOPN INJECT 1.5 MG into THE SKIN ONCE A WEEK Patient not taking: Reported on 06/24/2022 04/06/22   Steffanie Rainwater, MD      Allergies    Patient has no known allergies.    Review of Systems   Review of Systems  Gastrointestinal:  Positive for abdominal pain.  Genitourinary:  Positive for hematuria.  All other systems reviewed and are negative.   Physical Exam Updated Vital Signs BP (!) 150/92   Pulse 83   Temp 98 F (36.7 C) (Oral)  Resp 17   Wt 127 kg   SpO2 100%   BMI 37.97 kg/m  Physical Exam Vitals and nursing note reviewed.  Constitutional:      General: He is not in acute distress.    Appearance: He is well-developed.     Comments: Resting comfortably in bed  HENT:     Head: Normocephalic and atraumatic.  Eyes:     Conjunctiva/sclera: Conjunctivae normal.  Cardiovascular:     Rate and Rhythm: Normal rate and regular rhythm.     Heart sounds: No murmur heard. Pulmonary:     Effort: Pulmonary effort is normal. No respiratory distress.     Breath sounds: Normal breath sounds.  Abdominal:     Palpations:  Abdomen is soft.     Tenderness: There is no abdominal tenderness. There is no guarding. Negative signs include Murphy's sign, Rovsing's sign, McBurney's sign, psoas sign and obturator sign.     Comments: Negative heeltap test  Musculoskeletal:        General: No swelling.     Cervical back: Neck supple.  Skin:    General: Skin is warm and dry.     Capillary Refill: Capillary refill takes less than 2 seconds.  Neurological:     Mental Status: He is alert.  Psychiatric:        Mood and Affect: Mood normal.     ED Results / Procedures / Treatments   Labs (all labs ordered are listed, but only abnormal results are displayed) Labs Reviewed  COMPREHENSIVE METABOLIC PANEL - Abnormal; Notable for the following components:      Result Value   Potassium 2.8 (*)    CO2 20 (*)    Glucose, Bld 192 (*)    Creatinine, Ser 0.44 (*)    All other components within normal limits  URINALYSIS, ROUTINE W REFLEX MICROSCOPIC - Abnormal; Notable for the following components:   Color, Urine STRAW (*)    Glucose, UA 150 (*)    Hgb urine dipstick MODERATE (*)    Bacteria, UA RARE (*)    All other components within normal limits  CBG MONITORING, ED - Abnormal; Notable for the following components:   Glucose-Capillary 198 (*)    All other components within normal limits  CBC WITH DIFFERENTIAL/PLATELET  LIPASE, BLOOD    EKG None  Radiology CT ABDOMEN PELVIS W CONTRAST  Result Date: 06/28/2022 CLINICAL DATA:  Right lower quadrant pain EXAM: CT ABDOMEN AND PELVIS WITH CONTRAST TECHNIQUE: Multidetector CT imaging of the abdomen and pelvis was performed using the standard protocol following bolus administration of intravenous contrast. RADIATION DOSE REDUCTION: This exam was performed according to the departmental dose-optimization program which includes automated exposure control, adjustment of the mA and/or kV according to patient size and/or use of iterative reconstruction technique. CONTRAST:  75mL  OMNIPAQUE IOHEXOL 350 MG/ML SOLN COMPARISON:  06/24/2022 FINDINGS: Lower chest: No acute abnormality Hepatobiliary: Diffuse low-density throughout the liver compatible with fatty infiltration. No focal abnormality. Gallbladder unremarkable. Pancreas: No focal abnormality or ductal dilatation. Spleen: No focal abnormality.  Normal size. Adrenals/Urinary Tract: No adrenal abnormality. No focal renal abnormality. No stones or hydronephrosis. Urinary bladder is unremarkable. Stomach/Bowel: Normal appendix. Stomach, large and small bowel grossly unremarkable. Vascular/Lymphatic: No evidence of aneurysm or adenopathy. Reproductive: No visible focal abnormality. Other: No free fluid or free air. Musculoskeletal: No acute bony abnormality. IMPRESSION: Normal appendix. No acute findings or change since prior study. Fatty liver. Electronically Signed   By: Charlett Nose M.D.  On: 06/28/2022 20:54    Procedures Procedures    Medications Ordered in ED Medications  sodium chloride 0.9 % bolus 1,000 mL (1,000 mLs Intravenous New Bag/Given 06/28/22 1919)  potassium chloride 10 mEq in 100 mL IVPB (0 mEq Intravenous Stopped 06/28/22 2100)  potassium chloride SA (KLOR-CON M) CR tablet 40 mEq (40 mEq Oral Given 06/28/22 1918)  iohexol (OMNIPAQUE) 350 MG/ML injection 75 mL (75 mLs Intravenous Contrast Given 06/28/22 2048)    ED Course/ Medical Decision Making/ A&P                             Medical Decision Making Amount and/or Complexity of Data Reviewed Radiology: ordered.  Risk Prescription drug management.  This patient presents to the ED for concern of abdominal pain, this involves an extensive number of treatment options, and is a complaint that carries with it a high risk of complications and morbidity. The differential diagnosis for generalized abdominal pain includes, but is not limited to AAA, gastroenteritis, appendicitis, Bowel obstruction, Bowel perforation. Gastroparesis, DKA, Hernia, Inflammatory  bowel disease, mesenteric ischemia, pancreatitis, peritonitis SBP, volvulus.   Co morbidities that complicate the patient evaluation   insulin-dependent diabetes, anxiety, depression  My initial workup includes labs, CT abdomen pelvis, fluids  Additional history obtained from: Nursing notes from this visit.  I ordered, reviewed and interpreted labs which include: CBC, CMP, lipase.  Hypokalemia of 2.8, hyperglycemia of 192.  Normal anion gap.  Urinalysis with moderate hemoglobin and rare bacteria  I ordered imaging studies including CT abdomen pelvis I independently visualized and interpreted imaging which showed normal CT abdomen pelvis I agree with the radiologist interpretation  Afebrile, hemodynamically stable.  22 year old male presents ED for evaluation of right lower quadrant abdominal pain.  He has had this pain since he was discharged from the hospital for DKA 3 days ago.  He states he was not eating or drinking anything for a few days prior to admission to the hospital.  He has not had any pain or other symptoms during his stay in the ED.  His physical exam is overall reassuring.  No peritoneal signs.  Lab workup significant for hypokalemia of 2.8.  This may be secondary to restarting his insulin.  Anion gap is normal.  Lab work otherwise reassuring.  Low suspicion for DKA.  No ketones in his urine.  Does not have any nausea or vomiting.  Patient was sent a prescription for potassium.  He was encouraged to follow-up with his primary care provider in 1 week.  He was given return precautions.  Stable at discharge.  At this time there does not appear to be any evidence of an acute emergency medical condition and the patient appears stable for discharge with appropriate outpatient follow up. Diagnosis was discussed with patient who verbalizes understanding of care plan and is agreeable to discharge. I have discussed return precautions with patient and grandmother who verbalizes understanding.  Patient encouraged to follow-up with their PCP within 1 week. All questions answered.  Note: Portions of this report may have been transcribed using voice recognition software. Every effort was made to ensure accuracy; however, inadvertent computerized transcription errors may still be present.        Final Clinical Impression(s) / ED Diagnoses Final diagnoses:  Right lower quadrant abdominal pain  Hypokalemia    Rx / DC Orders ED Discharge Orders          Ordered    potassium  chloride SA (KLOR-CON M) 20 MEQ tablet  Daily        06/28/22 2115              Mora Bellman 06/28/22 2119    Benjiman Core, MD 06/28/22 2249

## 2022-06-28 NOTE — Discharge Instructions (Addendum)
You have been seen today for your complaint of abdominal pain. Your lab work showed low potassium but was otherwise reassuring. Your imaging was reassuring and showed no abnormalities. Your discharge medications include potassium.  Take this daily for the next 5 days. Follow up with: Your primary care provider in 1 week Please seek immediate medical care if you develop any of the following symptoms: Your pain does not go away as soon as your doctor says it should. You cannot stop vomiting. Your pain is only in areas of your belly, such as the right side or the left lower part of the belly. You have bloody or black poop, or poop that looks like tar. You have very bad pain, cramping, or bloating in your belly. You have signs of not having enough fluid or water in your body (dehydration), such as: Dark pee, very little pee, or no pee. Cracked lips. Dry mouth. Sunken eyes. Sleepiness. Weakness. You have trouble breathing or chest pain. At this time there does not appear to be the presence of an emergent medical condition, however there is always the potential for conditions to change. Please read and follow the below instructions.  Do not take your medicine if  develop an itchy rash, swelling in your mouth or lips, or difficulty breathing; call 911 and seek immediate emergency medical attention if this occurs.  You may review your lab tests and imaging results in their entirety on your MyChart account.  Please discuss all results of fully with your primary care provider and other specialist at your follow-up visit.  Note: Portions of this text may have been transcribed using voice recognition software. Every effort was made to ensure accuracy; however, inadvertent computerized transcription errors may still be present.

## 2022-06-28 NOTE — ED Triage Notes (Signed)
Pt arrives with c/o RLQ ABD pain that started 2-3 days ago. Per pt, his urine is pink tinged. Pt is unsure how logn his urine has been pink tinged. Pt has hx of diabetes and was recently admitted for DKA. Pt endorse nausea. Pt denies vomiting or fevers.

## 2022-07-04 ENCOUNTER — Telehealth: Payer: Self-pay | Admitting: Neurology

## 2022-07-04 NOTE — Telephone Encounter (Signed)
Ok we are watching for the fax

## 2022-07-04 NOTE — Telephone Encounter (Signed)
AJ from [place] [facility] called needing to speak to the RN regarding the pt's [facility]  and about a hospitalization pt had. He will be faxing the information now.

## 2022-07-04 NOTE — Telephone Encounter (Signed)
Received this am.

## 2022-07-24 ENCOUNTER — Other Ambulatory Visit: Payer: Self-pay

## 2022-07-24 DIAGNOSIS — E782 Mixed hyperlipidemia: Secondary | ICD-10-CM

## 2022-07-24 NOTE — Telephone Encounter (Signed)
This prescription was filled on 07/04/2022.  Per pt's grandmother-pt has transferred care to another office [facility]?) Request denied.Kingsley Spittle Cassady6/10/20248:35 AM

## 2022-07-28 ENCOUNTER — Other Ambulatory Visit: Payer: Self-pay

## 2022-07-28 DIAGNOSIS — E782 Mixed hyperlipidemia: Secondary | ICD-10-CM

## 2022-08-03 NOTE — Telephone Encounter (Signed)
Pt has been waiting since 07/28/2022 for his refill  atorvastatin (LIPITOR) 10 MG tablet [Pharmacy Med Name: atorvastatin 10 mg tablet] [[id number]]     [facility] - [place], [place] - [address] (Ph: [phone])

## 2022-09-01 IMAGING — DX DG CHEST 1V PORT
1 series · 1 of 1 positions shown · non-contrast
Comparison: No prior.

CLINICAL DATA: Weakness.

EXAM:
PORTABLE CHEST 1 VIEW

[chest]
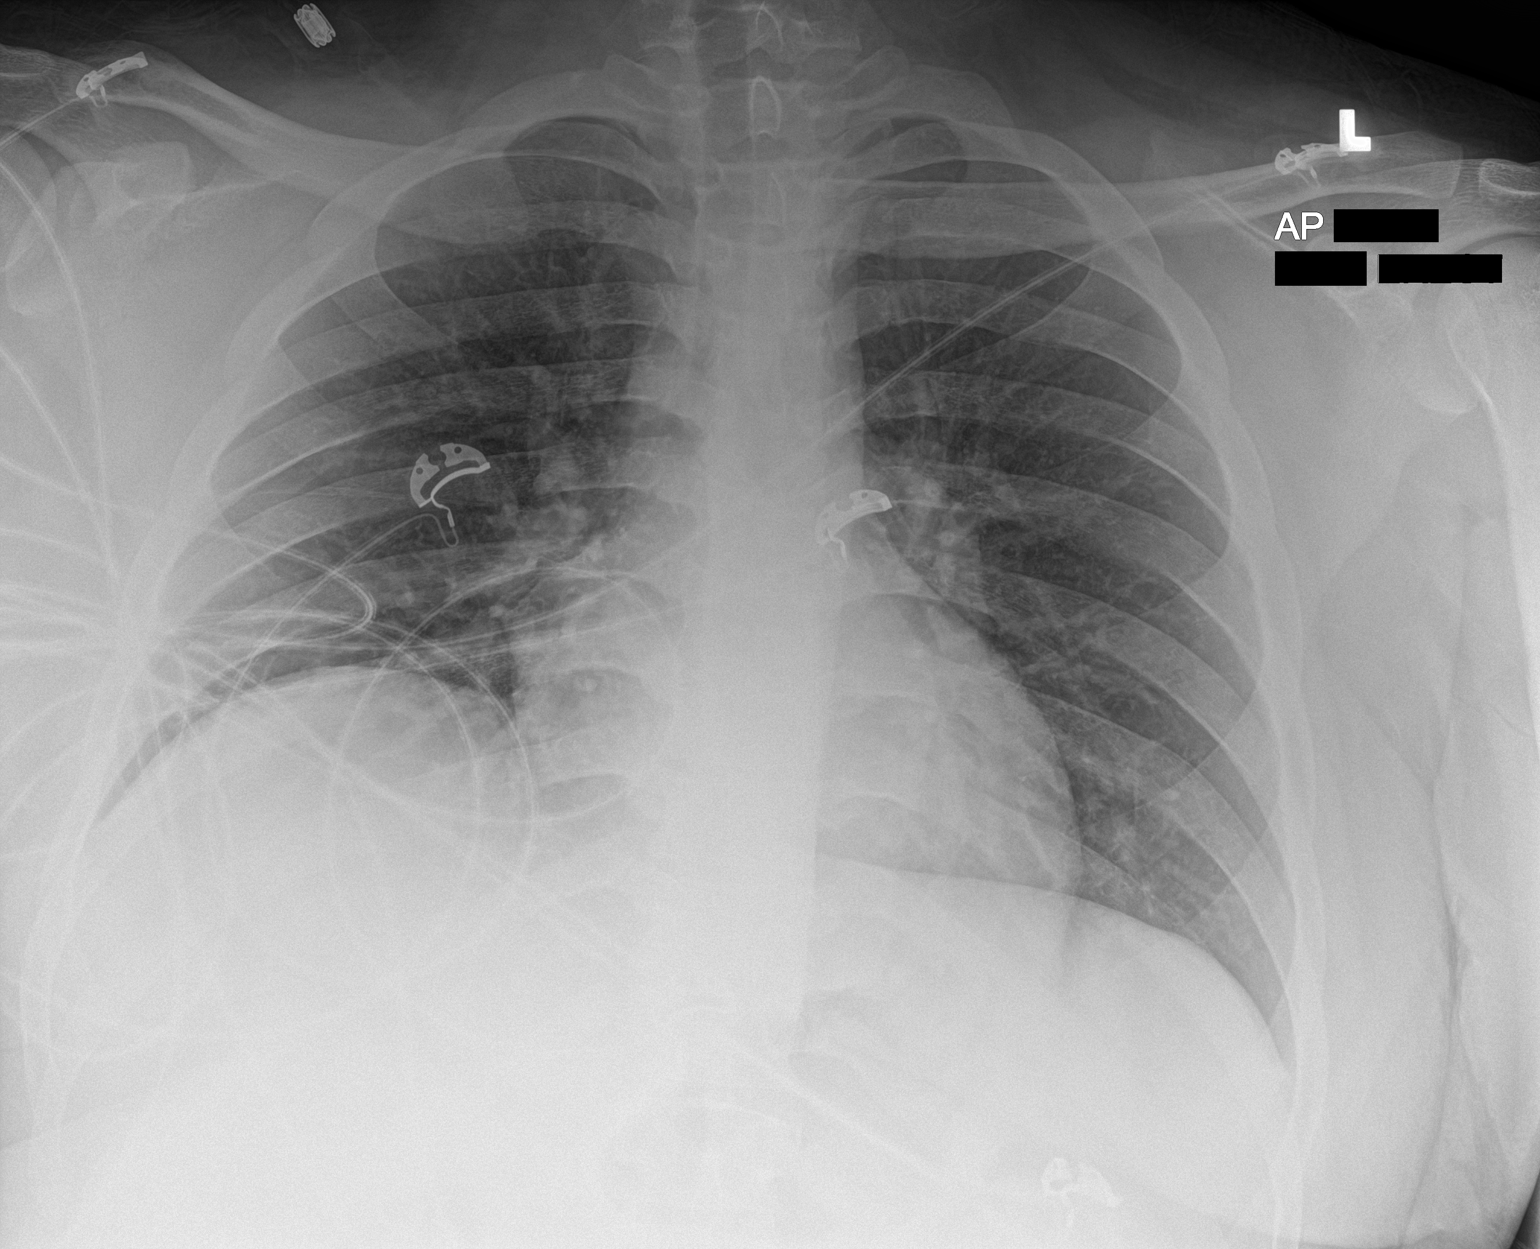

[1 of 1 positions shown; findings below may reference images not displayed]

FINDINGS: Mediastinum hilar structures normal. Low lung volumes with mild
right base atelectasis. Mild right base infiltrate cannot be
excluded. Elevation of the right hemidiaphragm. No pleural effusion
or pneumothorax. Degenerative change thoracic spine.
IMPRESSION: Low lung volumes with mild right base atelectasis. Mild right base
infiltrate cannot be excluded. Elevated right hemidiaphragm.

## 2022-09-14 ENCOUNTER — Ambulatory Visit: Payer: Medicare Other | Admitting: Neurology

## 2022-09-20 ENCOUNTER — Encounter: Payer: Self-pay | Admitting: Neurology

## 2022-09-20 ENCOUNTER — Ambulatory Visit: Payer: 59 | Admitting: Neurology

## 2022-12-25 ENCOUNTER — Telehealth: Payer: Self-pay | Admitting: *Deleted

## 2022-12-25 NOTE — Telephone Encounter (Signed)
I called pt no answer.  I called and LMVM for pts grandmother( Ms. Dan Humphreys) to call back about needing appt for pt ([facility] infusion needing recent notes and labs).  Ok per [employer].  Need appt with NP.

## 2022-12-25 NOTE — Telephone Encounter (Signed)
Received fax from [facility] needing recent office visit notes and labs to obtain authorization for IVIG (Gamunex C) pa end 02-13-2023. for CIDP.

## 2022-12-25 NOTE — Telephone Encounter (Signed)
Yes, he can see an NP, thanks

## 2022-12-26 NOTE — Patient Instructions (Signed)
Below is our plan:  We will continue IVIG every 4 weeks. Please keep a close eye on your BP and CBGs at home.   Please make sure you are staying well hydrated. I recommend 50-60 ounces daily. Well balanced diet and regular exercise encouraged. Consistent sleep schedule with 6-8 hours recommended.   Please continue follow up with care team as directed.   Follow up with Dr Lucia Gaskins in 1 year   You may receive a survey regarding today's visit. I encourage you to leave honest feed back as I do use this information to improve patient care. Thank you for seeing me today!

## 2022-12-26 NOTE — Progress Notes (Unsigned)
No chief complaint on file.   HISTORY OF PRESENT ILLNESS:  12/26/22 ALL:  Lucas Cunningham is a 22 y.o. male here today for follow up for CIDP. He was last seen 12/2021 by Lucas Cunningham and doing well on IVIG q 4wks.     HISTORY (copied from Lucas Cunningham previous note)  12/14/2021: Getting the medication every 4 weeks and can walk. When we first met, with leg weakness, sensory problems with the limbs, had to use a walker, was progressively weakening. Had been to the ED and multiple doctor. Took about 2-3 IVIG sessions and started improving. Now not using a walker and walks "good", he still gets some pain in his hands and feet but much better. But he has more feeling. Prior to IVIG session he starts experiencing worsening symptoms and then after treatment within a few days he is improved again. He gets infusions every 4 weeks, will keep it there, has it down to one day.    Patient complains of symptoms per HPI as well as the following symptoms: numbness . Pertinent negatives and positives per HPI. All others negative   Interval history 01/23/2021: Has only had 2 IVIG. Is feeling better. Power out so appointment performed offline. He is still using his walker. He has pfos. It is time for repeat PT now that he has IVIG, will order and discussed we hopefully can get him walking with a cane an then hopefully without any walking aids. He ay always have some weakness and sensory changes distally, will need OT for fine motor.    Reviewed images (additional 20 minutes in addition to 20 minute appointment) MRI c-spine and L-spine unremarkable   MRI brain: IMPRESSION 03/15/2020: This MRI of the brain with and without contrast shows the following: 1.   The brain and upper cervical spinal cord appear normal. 2.   Bilateral mastoid effusions likely due to eustachian tube dysfunction. 3.   Mild right maxillary chronic sinusitis 4.   Normal enhancement pattern and no acute findings.   Interval history March 11, 2020: Patient is here with his grandmother for evaluation.  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  I gave them literature to read.   HPI:  Lucas Cunningham is a 22 y.o. male here as requested by No ref. provider found for diabetic neuropathy.  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  I reviewed his notes from pediatrics back through 2002 and they are significant for: Multiple admissions for uncontrolled diabetes type 1 and DKA, diabetic neuropathy with leg weakness, sensory problems with the limbs, on gabapentin, last saw neurology in June 2021 and was referred to adult neurologist to manage him, now walks with a walker, mother died in Sep 09, 2021he lives with his grandmother.  I reviewed epic and "care  everywhere", I did not see any notes from neurology.  Examination from 2021 October showed obesity, standing with the support of a walker, he can get up from chairs, however alert and bright, normal head face eyes ears, neck visibly normal but does have a thyroid enlargement, lungs are clear to auscultation, heart rates normal without murmurs, abdomen normal, arms normal, no obvious tremor, leg muscles appear normal for age, feet are normally formed, dorsalis pedis  pulses 1+, strength was difficult to assess overall, sensation to touch normal in the legs but decreased in both feet.   He has seen child neurology in the past. Just a few months ago. He can't remember the name. Poor historian. Unclear if he even saw neurology. He is here because his legs are very weak, sensory changes in the legs, been ongoing for about 2 years, when it first started it was "bad", started acutely he says he couldn't stand up. Then he improved. He couldn't step on a curve back then but now he can. Happened in both legs,symmetrical,feels "weird" to touch his legs.  Symptoms started in the right hand a few months ago, his wrist became weak and he can;t lift his hand, after hitting the house, he has numbness in the right hand, tingly, more in digits 1-2 of the right hand. No back pain, no neck pain. Using a walker for since 2020. Sometimes painful in the right hand but not in the legs or feet. Legs are stable. The right    Reviewed notes, labs and imaging from outside physicians, which showed:    10/25/2019: hgba1c 14.4   REVIEW OF SYSTEMS: Out of a complete 14 system review of symptoms, the patient complains only of the following symptoms, and all other reviewed systems are negative.   ALLERGIES: No Known Allergies   HOME MEDICATIONS: Outpatient Medications Prior to Visit  Medication Sig Dispense Refill   atorvastatin (LIPITOR) 10 MG tablet TAKE ONE TABLET BY MOUTH ONCE DAILY (Patient taking differently: Take 10 mg by mouth daily.) 90 tablet 2   COMFORT EZ PEN NEEDLES 32G X 4 MM MISC INJECT UP TO EIGHT TIMES DAILY 300 each 6   Continuous Blood Gluc Receiver (DEXCOM G6 RECEIVER) DEVI 1 Device by Does not apply route as directed. 1 each 2   Continuous Blood Gluc Sensor (DEXCOM G6 SENSOR) MISC INJECT ONE applicator into THE SKIN AS DIRECTED AND CHANGE EVERY 10 DAYS 3 each 11   Continuous Blood Gluc Transmit (DEXCOM G6 TRANSMITTER) MISC INJECT ONE device into THE SKIN AS DIRECTED.  reuse UP TO EIGHT times WITH each new sensor 1 each 3   fenofibrate 54 MG tablet Take 1 tablet (54 mg total) by mouth daily. 30 tablet 2   GAMUNEX-C 20 GM/200ML SOLN Inject 800 mLs into the vein every 28 (twenty-eight) days.     GAMUNEX-C 5 GM/50ML SOLN Inject 50 mLs into the vein every 28 (twenty-eight) days.     insulin aspart (NOVOLOG) 100 UNIT/ML FlexPen Inject 30 Units into the skin 3 (three) times daily with meals. 15 mL 11   insulin glargine-yfgn (SEMGLEE) 100 UNIT/ML injection Inject 1 mL (100 Units total) into the skin daily. 10 mL 11   potassium chloride SA (KLOR-CON M) 20 MEQ tablet Take 1 tablet (20 mEq total) by mouth daily for 5 days. 5 tablet 0   TRULICITY 1.5 MG/0.5ML SOPN INJECT 1.5 MG into THE SKIN ONCE A WEEK (Patient not taking: Reported on 06/24/2022) 2 mL 3   No facility-administered medications  prior to visit.     PAST MEDICAL HISTORY: Past Medical History:  Diagnosis Date   CIDP (chronic inflammatory demyelinating polyneuropathy) (HCC)    Diabetes mellitus without complication (HCC)    type 1     PAST SURGICAL HISTORY: Past Surgical History:  Procedure Laterality Date   NO PAST SURGERIES       FAMILY HISTORY: Family History  Problem Relation Age of Onset   Heart disease Mother    Hypertension Mother    Diabetes Maternal Grandmother    Diabetes Maternal Grandfather    Diabetes Paternal Grandmother    Migraines Neg Hx    Seizures Neg Hx    Autism Neg Hx    ADD / ADHD Neg Hx    Anxiety disorder Neg Hx    Depression Neg Hx    Bipolar disorder Neg Hx    Schizophrenia Neg Hx      SOCIAL HISTORY: Social History   Socioeconomic History   Marital status: Single    Spouse name: Not on file   Number of children: Not on file   Years of education: Not on file   Highest education level: Not on file  Occupational History   Not on file  Tobacco Use   Smoking status: Never    Passive exposure: Yes   Smokeless tobacco: Never  Vaping Use   Vaping  status: Some Days  Substance and Sexual Activity   Alcohol use: Not Currently   Drug use: Yes    Types: Marijuana   Sexual activity: Not Currently  Other Topics Concern   Not on file  Social History Narrative   Lives with his grandmother. He graduated HS 2021   Right handed   Caffeine: 2 cups/day   Social Determinants of Health   Financial Resource Strain: Not on file  Food Insecurity: No Food Insecurity (06/24/2022)   Hunger Vital Sign    Worried About Running Out of Food in the Last Year: Never true    Ran Out of Food in the Last Year: Never true  Transportation Needs: No Transportation Needs (06/24/2022)   PRAPARE - Administrator, Civil Service (Medical): No    Lack of Transportation (Non-Medical): No  Physical Activity: Insufficiently Active (03/18/2021)   Exercise Vital Sign    Days of Exercise per Week: 2 days    Minutes of Exercise per Session: 20 min  Stress: Not on file  Social Connections: Unknown (06/28/2021)   Received [facility]   Social Network    Social Network: Not on file  Intimate Partner Violence: Not At Risk (06/24/2022)   Humiliation, Afraid, Rape, and Kick questionnaire    Fear of Current or Ex-Partner: No    Emotionally Abused: No    Physically Abused: No    Sexually Abused: No     PHYSICAL EXAM  There were no vitals filed for this visit. There is no height or weight on file to calculate BMI.  Generalized: Well developed, in no acute distress  Cardiology: normal rate and rhythm, no murmur auscultated  Respiratory: clear to auscultation bilaterally    Neurological examination  Mentation: Alert oriented to time, place, history taking. Follows all commands speech and language fluent Cranial nerve II-XII: Pupils were equal round reactive to light. Extraocular movements were full, visual field were full on confrontational test. Facial sensation and strength were normal. Uvula tongue midline. Head turning and shoulder  shrug  were normal and symmetric. Motor: The motor testing reveals  5 over 5 strength of all 4 extremities. Good symmetric motor tone is noted throughout.  Sensory: Sensory testing is intact to soft touch on all 4 extremities. No evidence of extinction is noted.  Coordination: Cerebellar testing reveals good finger-nose-finger and heel-to-shin bilaterally.  Gait and station: Gait is normal. Tandem gait is normal. Romberg is negative. No drift is seen.  Reflexes: Deep tendon reflexes are symmetric and normal bilaterally.    DIAGNOSTIC DATA (LABS, IMAGING, TESTING) - I reviewed patient records, labs, notes, testing and imaging myself where available.  Lab Results  Component Value Date   WBC 8.5 06/28/2022   HGB 14.2 06/28/2022   HCT 42.5 06/28/2022   MCV 82.7 06/28/2022   PLT 163 06/28/2022      Component Value Date/Time   NA 138 06/28/2022 1722   NA 134 03/31/2021 1519   K 2.8 (L) 06/28/2022 1722   CL 104 06/28/2022 1722   CO2 20 (L) 06/28/2022 1722   GLUCOSE 192 (H) 06/28/2022 1722   BUN 9 06/28/2022 1722   BUN 9 03/31/2021 1519   CREATININE 0.44 (L) 06/28/2022 1722   CREATININE 0.47 (L) 05/25/2020 1537   CALCIUM 9.5 06/28/2022 1722   PROT 6.9 06/28/2022 1722   PROT 7.2 02/18/2020 1153   ALBUMIN 3.8 06/28/2022 1722   ALBUMIN 4.7 02/18/2020 1153   AST 24 06/28/2022 1722   ALT 25 06/28/2022 1722   ALKPHOS 82 06/28/2022 1722   BILITOT 0.3 06/28/2022 1722   BILITOT 0.3 02/18/2020 1153   GFRNONAA >60 06/28/2022 1722   GFRAA 188 02/18/2020 1153   Lab Results  Component Value Date   CHOL 212 (H) 06/25/2022   HDL 12 (L) 06/25/2022   LDLCALC UNABLE TO CALCULATE IF TRIGLYCERIDE OVER 400 mg/dL 41/32/4401   LDLDIRECT 54 06/25/2022   TRIG 754 (H) 06/25/2022   CHOLHDL 17.7 06/25/2022   Lab Results  Component Value Date   HGBA1C 13.3 (H) 06/24/2022   Lab Results  Component Value Date   VITAMINB12 802 02/18/2020   Lab Results  Component Value Date   TSH 3.240 02/18/2020         No data to display               No data to display           ASSESSMENT AND PLAN  22 y.o. year old male  has a past medical history of CIDP (chronic inflammatory demyelinating polyneuropathy) (HCC) and Diabetes mellitus without complication (HCC). here with    No diagnosis found.  Alphonzo Lemmings ***.  Healthy lifestyle habits encouraged. *** will follow up with PCP as directed. *** will return to see me in ***, sooner if needed. *** verbalizes understanding and agreement with this plan.   No orders of the defined types were placed in this encounter.    No orders of the defined types were placed in this encounter.    Shawnie Dapper, MSN, FNP-C 12/26/2022, 12:23 PM  [facility] [address], [address] [place], [place] [address] [phone]

## 2022-12-26 NOTE — Telephone Encounter (Signed)
Spoke with patient. He states he has been doing ok. I advised that a yearly f/u is needed in order to continue treating him with IVIG. Pt accepted an appt with Amy NP tomorrow 11/13 at 230 pm arrival 2 pm. He was appreciative.

## 2022-12-27 ENCOUNTER — Ambulatory Visit (INDEPENDENT_AMBULATORY_CARE_PROVIDER_SITE_OTHER): Payer: 59 | Admitting: Family Medicine

## 2022-12-27 ENCOUNTER — Encounter: Payer: Self-pay | Admitting: Family Medicine

## 2022-12-27 VITALS — BP 153/100 | HR 85 | Ht 72.0 in | Wt 283.0 lb

## 2022-12-27 DIAGNOSIS — G6181 Chronic inflammatory demyelinating polyneuritis: Secondary | ICD-10-CM

## 2022-12-28 NOTE — Telephone Encounter (Signed)
Faxed to [facility] last OV notes for continued IVIG treatments.  Confirmation fax received.  [phone].

## 2023-01-01 ENCOUNTER — Other Ambulatory Visit: Payer: Self-pay

## 2023-01-01 NOTE — Telephone Encounter (Signed)
Received following message from Amy Lomax,NP: "Can you guys check with his home infusion center and let them know he was seen and we will continue IVIG q4wks as currently prescribed. He was asking me about having infusions in a different state for a few months while visiting family. Can his infusion group assist with this? TY!"  Called [facility] at [phone]. Spoke w/ Melissa. States a benefit verification would need to be done for possible site change out of state. She placed me on hold to reach out to pt navigator, Maralyn Sago to get more information on this. States Maralyn Sago not available and she will have her call our office back to further discuss

## 2023-01-02 NOTE — Telephone Encounter (Signed)
Amy- just wanted to send update so you knew where things stood currently

## 2023-01-02 NOTE — Telephone Encounter (Signed)
Called [facility] infusion pharmacy back to see if I could speak with pt navigator since I had not received a call yet. He tried to get in touch with pt navigator but unable. He saw that she tried to reach pt this past Fri, LVM for grandma to set up delivery of medication for infusion scheduled on 01/16/23.   I called pt while he was on the phone. Pt leaving for [place], [place] 01/23/23. He plans to get infusion 01/16/23 and wants to get January infusion there. I relayed this. They will see if they can get this approved. I let pt know they would have to get site/infusion approved there in order for him to be infused there.   I was then able to speak with Lucas Cunningham, pt navigator. She will send to insurance verification team to see if he can be infused in [place] with UHC/Medicare and [place] Medicaid insurance.    Last infusion 12/15/2022.  Lucas Cunningham direct extension [id number]. She will call pt tonight to get address for where he is staying in [place] in case he is approved. That way they can set up delivery of medication. She will call back with update once they hear from insurance team.

## 2023-01-09 ENCOUNTER — Encounter (HOSPITAL_COMMUNITY): Payer: Self-pay | Admitting: Emergency Medicine

## 2023-01-09 ENCOUNTER — Other Ambulatory Visit: Payer: Self-pay

## 2023-01-09 ENCOUNTER — Emergency Department (HOSPITAL_COMMUNITY)
Admission: EM | Admit: 2023-01-09 | Discharge: 2023-01-09 | Disposition: A | Discharge status: Home / Self Care | Payer: 59 | Attending: Emergency Medicine | Admitting: Emergency Medicine

## 2023-01-09 DIAGNOSIS — Z794 Long term (current) use of insulin: Secondary | ICD-10-CM | POA: Diagnosis not present

## 2023-01-09 DIAGNOSIS — E109 Type 1 diabetes mellitus without complications: Secondary | ICD-10-CM | POA: Insufficient documentation

## 2023-01-09 DIAGNOSIS — R112 Nausea with vomiting, unspecified: Secondary | ICD-10-CM | POA: Diagnosis present

## 2023-01-09 DIAGNOSIS — R000 Tachycardia, unspecified: Secondary | ICD-10-CM | POA: Insufficient documentation

## 2023-01-09 LAB — COMPREHENSIVE METABOLIC PANEL
ALT: 23 U/L (ref 0–44)
AST: 19 U/L (ref 15–41)
Albumin: 4.5 g/dL (ref 3.5–5.0)
Alkaline Phosphatase: 96 U/L (ref 38–126)
Anion gap: 13 (ref 5–15)
BUN: 9 mg/dL (ref 6–20)
CO2: 10 mmol/L — ABNORMAL LOW (ref 22–32)
Calcium: 10 mg/dL (ref 8.9–10.3)
Chloride: 106 mmol/L (ref 98–111)
Creatinine, Ser: 0.86 mg/dL (ref 0.61–1.24)
GFR, Estimated: >60 mL/min (ref >60)
Glucose, Bld: 361 mg/dL — ABNORMAL HIGH (ref 70–99)
Potassium: 5 mmol/L (ref 3.5–5.1)
Sodium: 129 mmol/L — ABNORMAL LOW (ref 135–145)
Total Bilirubin: 1.1 mg/dL (ref <1.2)
Total Protein: 8.4 g/dL — ABNORMAL HIGH (ref 6.5–8.1)

## 2023-01-09 LAB — CBC
HCT: 51.5 % (ref 39.0–52.0)
Hemoglobin: 17.2 g/dL — ABNORMAL HIGH (ref 13.0–17.0)
MCH: 27.3 pg (ref 26.0–34.0)
MCHC: 33.4 g/dL (ref 30.0–36.0)
MCV: 81.9 fL (ref 80.0–100.0)
Platelets: 248 10*3/uL (ref 150–400)
RBC: 6.29 MIL/uL — ABNORMAL HIGH (ref 4.22–5.81)
RDW: 15.6 % — ABNORMAL HIGH (ref 11.5–15.5)
WBC: 7.4 10*3/uL (ref 4.0–10.5)
nRBC: 0.3 % — ABNORMAL HIGH (ref 0.0–0.2)

## 2023-01-09 LAB — I-STAT VENOUS BLOOD GAS, ED
Acid-base deficit: 10 mmol/L — ABNORMAL HIGH (ref 0.0–2.0)
Bicarbonate: 15.2 mmol/L — ABNORMAL LOW (ref 20.0–28.0)
Calcium, Ion: 1.39 mmol/L (ref 1.15–1.40)
HCT: 54 % — ABNORMAL HIGH (ref 39.0–52.0)
Hemoglobin: 18.4 g/dL — ABNORMAL HIGH (ref 13.0–17.0)
O2 Saturation: 57 %
Potassium: 4.6 mmol/L (ref 3.5–5.1)
Sodium: 133 mmol/L — ABNORMAL LOW (ref 135–145)
TCO2: 16 mmol/L — ABNORMAL LOW (ref 22–32)
pCO2, Ven: 32.7 mm[Hg] — ABNORMAL LOW (ref 44–60)
pH, Ven: 7.277 (ref 7.25–7.43)
pO2, Ven: 33 mm[Hg] (ref 32–45)

## 2023-01-09 LAB — CBG MONITORING, ED: Glucose-Capillary: 379 mg/dL — ABNORMAL HIGH (ref 70–99)

## 2023-01-09 LAB — BASIC METABOLIC PANEL
Anion gap: 10 (ref 5–15)
BUN: 8 mg/dL (ref 6–20)
CO2: 18 mmol/L — ABNORMAL LOW (ref 22–32)
Calcium: 9.6 mg/dL (ref 8.9–10.3)
Chloride: 101 mmol/L (ref 98–111)
Creatinine, Ser: 0.79 mg/dL (ref 0.61–1.24)
GFR, Estimated: >60 mL/min (ref >60)
Glucose, Bld: 259 mg/dL — ABNORMAL HIGH (ref 70–99)
Potassium: 4.6 mmol/L (ref 3.5–5.1)
Sodium: 129 mmol/L — ABNORMAL LOW (ref 135–145)

## 2023-01-09 LAB — URINALYSIS, ROUTINE W REFLEX MICROSCOPIC
Bacteria, UA: NONE SEEN
Bilirubin Urine: NEGATIVE
Glucose, UA: >=500 mg/dL — AB
Ketones, ur: 80 mg/dL — AB
Leukocytes,Ua: NEGATIVE
Nitrite: NEGATIVE
Protein, ur: >=300 mg/dL — AB
Specific Gravity, Urine: 1.041 — ABNORMAL HIGH (ref 1.005–1.030)
pH: 6 (ref 5.0–8.0)

## 2023-01-09 LAB — LIPASE, BLOOD: Lipase: 51 U/L (ref 11–51)

## 2023-01-09 LAB — BETA-HYDROXYBUTYRIC ACID: Beta-Hydroxybutyric Acid: 1.4 mmol/L — ABNORMAL HIGH (ref 0.05–0.27)

## 2023-01-09 MED ORDER — METOCLOPRAMIDE HCL 10 MG PO TABS: 10.0000 mg | ORAL_TABLET | Freq: Four times a day (QID) | ORAL | 0 refills | Status: AC | PRN

## 2023-01-09 MED ORDER — ONDANSETRON HCL 4 MG/2ML IJ SOLN
4.0000 mg | Freq: Once | INTRAMUSCULAR | Status: AC
  Administered 2023-01-09: 4 mg via INTRAVENOUS
  Filled 2023-01-09: qty 2

## 2023-01-09 MED ORDER — LACTATED RINGERS IV BOLUS
30.0000 mL/kg | Freq: Once | INTRAVENOUS | Status: AC
  Administered 2023-01-09: 2328 mL via INTRAVENOUS

## 2023-01-09 NOTE — ED Triage Notes (Signed)
Pt reports nausea and vomiting x 2 days. Denies fevers.

## 2023-01-09 NOTE — ED Notes (Addendum)
PT given a snack bag and a ginger ale for PO study.

## 2023-01-09 NOTE — Discharge Instructions (Addendum)
We evaluated you for your nausea and vomiting.  Your lab test showed signs of dehydration but did not show any sign of diabetic ketoacidosis.  Your symptoms could be due to a condition called gastroparesis.  This can happen when your blood sugar is elevated and you have diabetes.  It can cause you to have a lot of vomiting.  We have given you a prescription for nausea medication which you can try at home.  Please be sure to drink lots of fluids.  Please return if you have any recurrent symptoms, uncontrolled vomiting, chest pain, abdominal pain, fevers or chills, difficulty breathing, or any other concerning symptoms.

## 2023-01-09 NOTE — ED Provider Notes (Signed)
[place] [facility] AT [facility] Provider Note  CSN: [id number] Arrival date & time: 01/09/23 1648  Chief Complaint(s) Nausea  HPI Lucas Cunningham is a 22 y.o. male history of CIDP, diabetes type 1, presenting to the emergency department with nausea and vomiting.  He reports that he has been compliant with his insulin.  He reports that anytime he tries to eat he vomits.  He reports feeling dehydrated, some mild shortness of breath with exertion.  Has had prior episode of DKA and feels like this is not quite so bad.  Has also had some palpitations.  Denies any fevers or chills, chest pain, abdominal pain, dysuria, rashes, leg swelling, lightheadedness or dizziness, syncope.   Past Medical History Past Medical History:  Diagnosis Date   CIDP (chronic inflammatory demyelinating polyneuropathy) (HCC)    Diabetes mellitus without complication (HCC)    type 1   Patient Active Problem List   Diagnosis Date Noted   Hypertriglyceridemia 06/26/2022   Hypokalemia 06/26/2022   Proteinuria 11/23/2020   CIDP (chronic inflammatory demyelinating polyneuropathy) (HCC) 05/25/2020   Noncompliance with medication treatment due to difficulty with dosing 05/25/2020   Mixed hyperlipidemia 05/25/2020   Depression 05/25/2020   Diabetic polyneuropathy associated with type 1 diabetes mellitus (HCC) 02/26/2019   Postprandial bloating 02/25/2019   Sensory problems with limbs 02/25/2019   Uncontrolled type 1 diabetes mellitus with hyperglycemia (HCC) 05/24/2017   Morbid obesity (HCC) 05/24/2017   Home Medication(s) Prior to Admission medications   Medication Sig Start Date End Date Taking? Authorizing Provider  atorvastatin (LIPITOR) 10 MG tablet TAKE ONE TABLET BY MOUTH ONCE DAILY Patient taking differently: Take 10 mg by mouth daily. 10/27/21  Yes Adron Bene, MD  GAMUNEX-C 20 GM/200ML SOLN Inject 800 mLs into the vein every 28 (twenty-eight) days. 12/02/20  Yes [provider]  insulin aspart (NOVOLOG) 100 UNIT/ML FlexPen Inject 30 Units into the skin 3 (three) times daily with meals. 06/26/22  Yes Marrianne Mood, MD  metoCLOPramide (REGLAN) 10 MG tablet Take 1 tablet (10 mg total) by mouth every 6 (six) hours as needed for nausea or vomiting. 01/09/23  Yes Lonell Grandchild, MD  TRULICITY 1.5 MG/0.5ML SOPN INJECT 1.5 MG into THE SKIN ONCE A WEEK 04/06/22  Yes Amponsah, Flossie Buffy, MD  COMFORT EZ PEN NEEDLES 32G X 4 MM MISC INJECT UP TO EIGHT TIMES DAILY 06/02/22   Steffanie Rainwater, MD  Continuous Blood Gluc Receiver (DEXCOM G6 RECEIVER) DEVI 1 Device by Does not apply route as directed. 05/02/21   Steffanie Rainwater, MD  Continuous Blood Gluc Sensor (DEXCOM G6 SENSOR) MISC INJECT ONE applicator into THE SKIN AS DIRECTED AND CHANGE EVERY 10 DAYS 04/10/22   Reymundo Poll, MD  Continuous Blood Gluc Transmit (DEXCOM G6 TRANSMITTER) MISC INJECT ONE device into THE SKIN AS DIRECTED. reuse UP TO EIGHT times WITH each new sensor 04/06/22   Steffanie Rainwater, MD  insulin glargine-yfgn (SEMGLEE) 100 UNIT/ML injection Inject 1 mL (100 Units total) into the skin daily. Patient not taking: Reported on 01/09/2023 06/27/22   Marrianne Mood, MD  potassium chloride SA (KLOR-CON M) 20 MEQ tablet Take 1 tablet (20 mEq total) by mouth daily for 5 days. 06/28/22 07/03/22  Michelle Piper, PA-C  Past Surgical History Past Surgical History:  Procedure Laterality Date   NO PAST SURGERIES     Family History Family History  Problem Relation Age of Onset   Heart disease Mother    Hypertension Mother    Diabetes Maternal Grandmother    Diabetes Maternal Grandfather    Diabetes Paternal Grandmother    Migraines Neg Hx    Seizures Neg Hx    Autism Neg Hx    ADD / ADHD Neg Hx    Anxiety disorder Neg Hx    Depression Neg Hx    Bipolar  disorder Neg Hx    Schizophrenia Neg Hx     Social History Social History   Tobacco Use   Smoking status: Never    Passive exposure: Yes   Smokeless tobacco: Never  Vaping Use   Vaping status: Some Days  Substance Use Topics   Alcohol use: Not Currently   Drug use: Yes    Types: Marijuana   Allergies Patient has no known allergies.  Review of Systems Review of Systems  All other systems reviewed and are negative.   Physical Exam Vital Signs  I have reviewed the triage vital signs BP 137/86   Pulse (!) 101   Temp 98.6 F (37 C) (Oral)   Resp 19   Ht 6' (1.829 m)   Wt 128.4 kg   SpO2 99%   BMI 38.39 kg/m  Physical Exam Vitals and nursing note reviewed.  Constitutional:      General: He is not in acute distress.    Appearance: Normal appearance.  HENT:     Mouth/Throat:     Mouth: Mucous membranes are dry.  Eyes:     Conjunctiva/sclera: Conjunctivae normal.  Cardiovascular:     Rate and Rhythm: Regular rhythm. Tachycardia present.  Pulmonary:     Effort: Pulmonary effort is normal. No respiratory distress.     Breath sounds: Normal breath sounds.  Abdominal:     General: Abdomen is flat.     Palpations: Abdomen is soft.     Tenderness: There is no abdominal tenderness.  Musculoskeletal:     Right lower leg: No edema.     Left lower leg: No edema.  Skin:    General: Skin is warm and dry.     Capillary Refill: Capillary refill takes less than 2 seconds.  Neurological:     Mental Status: He is alert and oriented to person, place, and time. Mental status is at baseline.  Psychiatric:        Mood and Affect: Mood normal.        Behavior: Behavior normal.     ED Results and Treatments Labs (all labs ordered are listed, but only abnormal results are displayed) Labs Reviewed  COMPREHENSIVE METABOLIC PANEL - Abnormal; Notable for the following components:      Result Value   Sodium 129 (*)    CO2 10 (*)    Glucose, Bld 361 (*)    Total Protein 8.4  (*)    All other components within normal limits  CBC - Abnormal; Notable for the following components:   RBC 6.29 (*)    Hemoglobin 17.2 (*)    RDW 15.6 (*)    nRBC 0.3 (*)    All other components within normal limits  URINALYSIS, ROUTINE W REFLEX MICROSCOPIC - Abnormal; Notable for the following components:   Specific Gravity, Urine 1.041 (*)    Glucose, UA >=500 (*)    Hgb urine dipstick  SMALL (*)    Ketones, ur 80 (*)    Protein, ur >=300 (*)    All other components within normal limits  BETA-HYDROXYBUTYRIC ACID - Abnormal; Notable for the following components:   Beta-Hydroxybutyric Acid 1.40 (*)    All other components within normal limits  BASIC METABOLIC PANEL - Abnormal; Notable for the following components:   Sodium 129 (*)    CO2 18 (*)    Glucose, Bld 259 (*)    All other components within normal limits  CBG MONITORING, ED - Abnormal; Notable for the following components:   Glucose-Capillary 379 (*)    All other components within normal limits  I-STAT VENOUS BLOOD GAS, ED - Abnormal; Notable for the following components:   pCO2, Ven 32.7 (*)    Bicarbonate 15.2 (*)    TCO2 16 (*)    Acid-base deficit 10.0 (*)    Sodium 133 (*)    HCT 54.0 (*)    Hemoglobin 18.4 (*)    All other components within normal limits  LIPASE, BLOOD                                                                                                                          Radiology No results found.  Pertinent labs & imaging results that were available during my care of the patient were reviewed by me and considered in my medical decision making (see MDM for details).  Medications Ordered in ED Medications  lactated ringers bolus 2,328 mL (0 mLs Intravenous Stopped 01/09/23 2113)  ondansetron (ZOFRAN) injection 4 mg (4 mg Intravenous Given 01/09/23 1815)                                                                                                                                      Procedures Procedures  (including critical care time)  Medical Decision Making / ED Course   MDM:  22 year old with type 1 diabetes presenting with nausea and vomiting.  Patient appears dehydrated, vitals notable for tachycardia.  Would not be surprised if patient has DKA, will check labs including VBG and beta hydroxybutyrate.  Will give fluid bolus.  Differential also includes gastroparesis.  He has no abdominal tenderness on physical examination.  Lower concern for acute intra-abdominal process such as pancreatitis, cholecystitis, obstruction, perforation.  He reports medication compliance but his blood sugar is somewhat elevated.  Will reassess.  If labs are concerning for DKA he will need to be admitted.  Clinical Course as of 01/09/23 2205  Tue Jan 09, 2023  2203 Heart rate has improved significantly after IV fluids.  He has been able to tolerate p.o. without any recurrent vomiting.  Suspect symptoms may be due to gastroparesis.  Initial labs with low CO2 but no anion gap, concerning for dehydration.  His beta hydroxybutyrate is only very minimally elevated.  Basic metabolic panel obtained with significant improvement in his CO2.  Patient feels much better.  Will prescribe Reglan, discussed tricked return precautions for any worsening.  Advise follow-up with his primary care physician. Will discharge patient to home. All questions answered. Patient comfortable with plan of discharge. Return precautions discussed with patient and specified on the after visit summary.  [WS]    Clinical Course User Index [WS] Suezanne Jacquet, Jerilee Field, MD     Additional history obtained: -Additional history obtained from family -External records from outside source obtained and reviewed including: Chart review including previous notes, labs, imaging, consultation notes including prior PMD notes and admissions    Lab Tests: -I ordered, reviewed, and interpreted labs.   The pertinent results include:    Labs Reviewed  COMPREHENSIVE METABOLIC PANEL - Abnormal; Notable for the following components:      Result Value   Sodium 129 (*)    CO2 10 (*)    Glucose, Bld 361 (*)    Total Protein 8.4 (*)    All other components within normal limits  CBC - Abnormal; Notable for the following components:   RBC 6.29 (*)    Hemoglobin 17.2 (*)    RDW 15.6 (*)    nRBC 0.3 (*)    All other components within normal limits  URINALYSIS, ROUTINE W REFLEX MICROSCOPIC - Abnormal; Notable for the following components:   Specific Gravity, Urine 1.041 (*)    Glucose, UA >=500 (*)    Hgb urine dipstick SMALL (*)    Ketones, ur 80 (*)    Protein, ur >=300 (*)    All other components within normal limits  BETA-HYDROXYBUTYRIC ACID - Abnormal; Notable for the following components:   Beta-Hydroxybutyric Acid 1.40 (*)    All other components within normal limits  BASIC METABOLIC PANEL - Abnormal; Notable for the following components:   Sodium 129 (*)    CO2 18 (*)    Glucose, Bld 259 (*)    All other components within normal limits  CBG MONITORING, ED - Abnormal; Notable for the following components:   Glucose-Capillary 379 (*)    All other components within normal limits  I-STAT VENOUS BLOOD GAS, ED - Abnormal; Notable for the following components:   pCO2, Ven 32.7 (*)    Bicarbonate 15.2 (*)    TCO2 16 (*)    Acid-base deficit 10.0 (*)    Sodium 133 (*)    HCT 54.0 (*)    Hemoglobin 18.4 (*)    All other components within normal limits  LIPASE, BLOOD    Notable for acidosis without anion gap, likely dehydration related   Medicines ordered and prescription drug management: Meds ordered this encounter  Medications   lactated ringers bolus 2,328 mL   ondansetron (ZOFRAN) injection 4 mg   metoCLOPramide (REGLAN) 10 MG tablet    Sig: Take 1 tablet (10 mg total) by mouth every 6 (six) hours as needed for nausea or vomiting.    Dispense:  30 tablet    Refill:  0    -  I have reviewed the patients  home medicines and have made adjustments as needed    Cardiac Monitoring: The patient was maintained on a cardiac monitor.  I personally viewed and interpreted the cardiac monitored which showed an underlying rhythm of: sinus tachycardia   Social Determinants of Health:  Diagnosis or treatment significantly limited by social determinants of health: obesity   Reevaluation: After the interventions noted above, I reevaluated the patient and found that their symptoms have resolved  Co morbidities that complicate the patient evaluation  Past Medical History:  Diagnosis Date   CIDP (chronic inflammatory demyelinating polyneuropathy) (HCC)    Diabetes mellitus without complication (HCC)    type 1      Dispostion: Disposition decision including need for hospitalization was considered, and patient discharged from emergency department.    Final Clinical Impression(s) / ED Diagnoses Final diagnoses:  Nausea and vomiting, unspecified vomiting type     This chart was dictated using voice recognition software.  Despite best efforts to proofread,  errors can occur which can change the documentation meaning.    Lonell Grandchild, MD 01/09/23 2205

## 2023-01-27 IMAGING — XA DG SPINAL PUNCT LUMBAR DIAG WITH FL CT GUIDANCE
2 series · 2 of 2 positions shown · non-contrast
Comparison: none

CLINICAL DATA: Chronic inflammatory demyelinating polyneuropathy.

[Series 1: ortho adipose · 1 of 1 slices shown (1 of 2)]
[im 1/1]
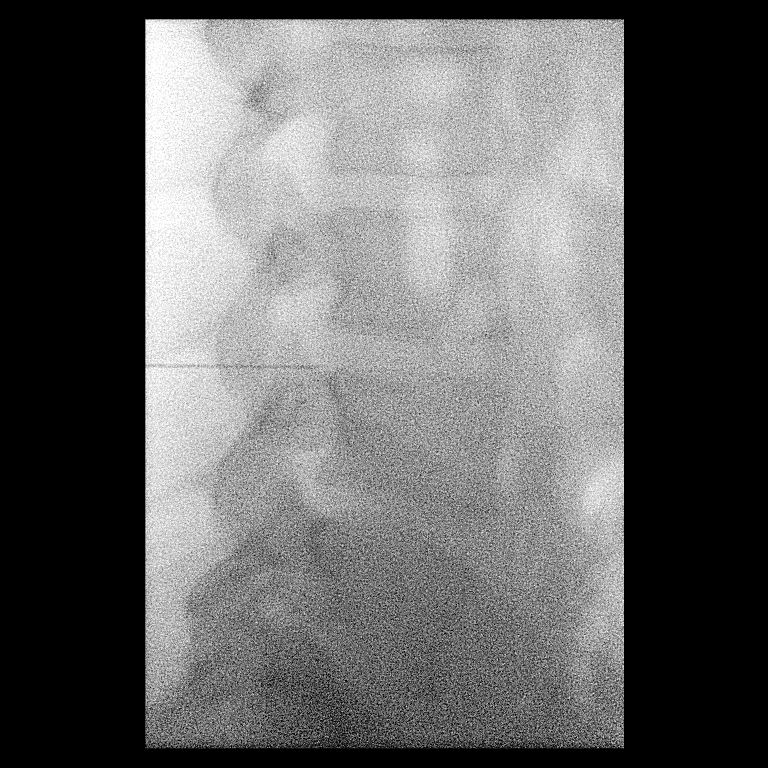

[Series 2: ortho adipose · 1 of 1 slices shown (2 of 2)]
[im 1/1]
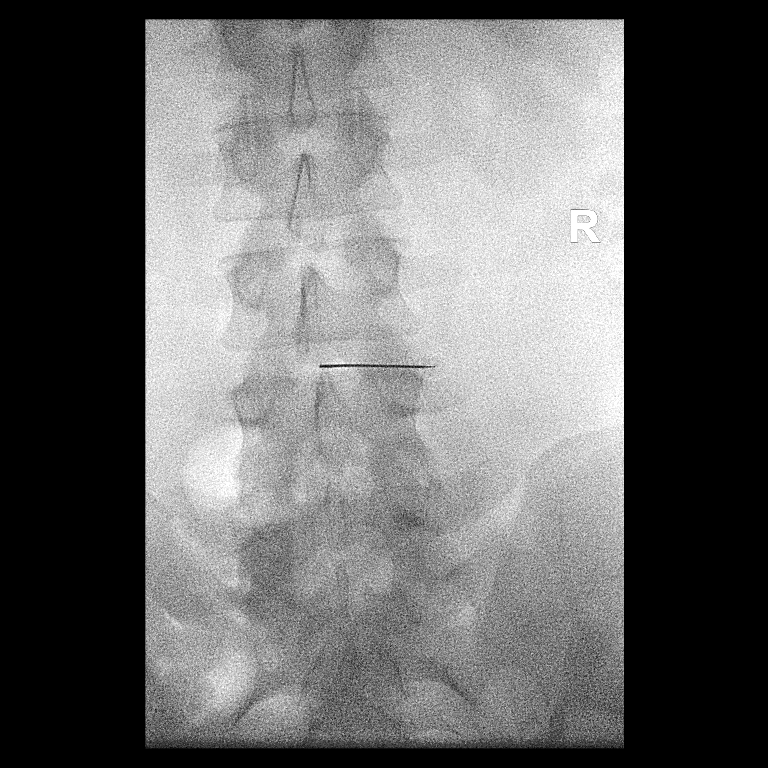

[2 of 2 positions shown; findings below may reference images not displayed]

EXAM:
DIAGNOSTIC LUMBAR PUNCTURE UNDER FLUOROSCOPIC GUIDANCE

FLUOROSCOPY TIME:  Fluoroscopy Time:  12 seconds

Radiation Exposure Index (if provided by the fluoroscopic device):
35.51 microGray*m^2

Number of Acquired Spot Images: 0

PROCEDURE:
Informed consent was obtained from the patient prior to the
procedure, including potential complications of headache, allergy,
and pain. With the patient prone, the lower back was prepped with
Betadine. 1% Lidocaine was used for local anesthesia. Lumbar
puncture was performed at the L3-4 level using a 6 inch 20 gauge
needle via a right interlaminar approach with return of clear CSF
with an opening pressure of 17 cm water (measured in the left
lateral decubitus position). 13 mL of CSF were obtained for
laboratory studies. The patient tolerated the procedure well and
there were no apparent complications.
IMPRESSION: Technically successful fluoroscopically guided lumbar puncture.

## 2023-02-13 NOTE — Telephone Encounter (Signed)
 At the request of [facility] I sent the PA to plan on CMM. Key: [id number]. It is pending determination with [employer]. I let the team know at [facility] that it has been submitted to plan.

## 2023-03-16 ENCOUNTER — Other Ambulatory Visit: Payer: Self-pay | Admitting: Internal Medicine

## 2023-03-16 DIAGNOSIS — E1065 Type 1 diabetes mellitus with hyperglycemia: Secondary | ICD-10-CM

## 2023-11-05 ENCOUNTER — Telehealth: Payer: Self-pay | Admitting: *Deleted

## 2023-11-05 NOTE — Telephone Encounter (Signed)
 I called and spoke to Lucas Cunningham, and relayed that got discharge order to sign for pt who had moved to [facility], [place].  Pt confirmed.  She said they would not service out of state.  I faxed to # listed and received confirmation. [phone].

## 2023-11-05 NOTE — Telephone Encounter (Signed)
 I called pt and he stated he has moved to [facility].  I told him that received fax from [facility] and asking for discharge due to pt moving.  Pt confirmed.  I told him that I will cancel the appt with Dr. Ines that was 12-27-2023.  Wished him well.  He appreciated call.

## 2023-12-27 ENCOUNTER — Ambulatory Visit: Payer: 59 | Admitting: Neurology
